# Patient Record
Sex: Female | Born: 1945
Health system: Southern US, Community
[De-identification: ages and names within clinical notes are randomized; demographics above are authoritative.]

## PROBLEM LIST (undated history)

## (undated) DIAGNOSIS — Z86010 Personal history of colonic polyps: Secondary | ICD-10-CM

## (undated) DIAGNOSIS — M797 Fibromyalgia: Secondary | ICD-10-CM

## (undated) DIAGNOSIS — Z923 Personal history of irradiation: Secondary | ICD-10-CM

## (undated) DIAGNOSIS — B0229 Other postherpetic nervous system involvement: Secondary | ICD-10-CM

## (undated) DIAGNOSIS — T4145XA Adverse effect of unspecified anesthetic, initial encounter: Secondary | ICD-10-CM

## (undated) DIAGNOSIS — K219 Gastro-esophageal reflux disease without esophagitis: Secondary | ICD-10-CM

## (undated) DIAGNOSIS — J189 Pneumonia, unspecified organism: Secondary | ICD-10-CM

## (undated) DIAGNOSIS — Z87442 Personal history of urinary calculi: Secondary | ICD-10-CM

## (undated) DIAGNOSIS — J449 Chronic obstructive pulmonary disease, unspecified: Secondary | ICD-10-CM

## (undated) DIAGNOSIS — N200 Calculus of kidney: Secondary | ICD-10-CM

## (undated) DIAGNOSIS — T8859XA Other complications of anesthesia, initial encounter: Secondary | ICD-10-CM

## (undated) DIAGNOSIS — C50919 Malignant neoplasm of unspecified site of unspecified female breast: Secondary | ICD-10-CM

## (undated) HISTORY — DX: Other postherpetic nervous system involvement: B02.29

## (undated) HISTORY — DX: Personal history of colonic polyps: Z86.010

## (undated) HISTORY — DX: Gastro-esophageal reflux disease without esophagitis: K21.9

## (undated) HISTORY — DX: Personal history of irradiation: Z92.3

## (undated) HISTORY — DX: Calculus of kidney: N20.0

## (undated) HISTORY — DX: Malignant neoplasm of unspecified site of unspecified female breast: C50.919

## (undated) HISTORY — DX: Fibromyalgia: M79.7

## (undated) HISTORY — PX: APPENDECTOMY: SHX54

---
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
## 1974-05-30 HISTORY — PX: TUBAL LIGATION: SHX77

## 1991-05-31 HISTORY — PX: GLAUCOMA SURGERY: SHX656

## 1994-05-30 HISTORY — PX: FOOT SURGERY: SHX648

## 1998-05-30 DIAGNOSIS — B0229 Other postherpetic nervous system involvement: Secondary | ICD-10-CM

## 1998-05-30 HISTORY — DX: Other postherpetic nervous system involvement: B02.29

## 2002-12-19 ENCOUNTER — Emergency Department (HOSPITAL_COMMUNITY): Admission: AD | Admit: 2002-12-19 | Discharge: 2002-12-20 | Payer: Self-pay | Admitting: Emergency Medicine

## 2002-12-20 ENCOUNTER — Encounter: Payer: Self-pay | Admitting: Emergency Medicine

## 2004-07-07 ENCOUNTER — Ambulatory Visit: Payer: Self-pay | Admitting: Family Medicine

## 2004-10-05 ENCOUNTER — Ambulatory Visit: Payer: Self-pay | Admitting: Family Medicine

## 2004-10-20 ENCOUNTER — Encounter: Admission: RE | Admit: 2004-10-20 | Discharge: 2004-10-20 | Payer: Self-pay | Admitting: Family Medicine

## 2004-10-26 LAB — HM MAMMOGRAPHY: HM Mammogram: NORMAL

## 2004-11-05 ENCOUNTER — Ambulatory Visit: Payer: Self-pay | Admitting: Family Medicine

## 2004-12-06 ENCOUNTER — Encounter: Admission: RE | Admit: 2004-12-06 | Discharge: 2004-12-06 | Payer: Self-pay | Admitting: Sports Medicine

## 2005-03-18 ENCOUNTER — Ambulatory Visit: Payer: Self-pay | Admitting: Family Medicine

## 2005-03-22 ENCOUNTER — Ambulatory Visit: Payer: Self-pay | Admitting: Family Medicine

## 2005-03-30 LAB — FECAL OCCULT BLOOD, GUAIAC: Fecal Occult Blood: NEGATIVE

## 2005-04-13 ENCOUNTER — Ambulatory Visit: Payer: Self-pay | Admitting: Family Medicine

## 2005-11-09 ENCOUNTER — Ambulatory Visit: Payer: Self-pay | Admitting: Family Medicine

## 2006-01-12 ENCOUNTER — Ambulatory Visit: Payer: Self-pay | Admitting: Family Medicine

## 2006-03-29 ENCOUNTER — Ambulatory Visit: Payer: Self-pay | Admitting: Family Medicine

## 2006-05-17 ENCOUNTER — Ambulatory Visit: Payer: Self-pay | Admitting: Family Medicine

## 2006-08-04 ENCOUNTER — Ambulatory Visit: Payer: Self-pay | Admitting: Family Medicine

## 2006-09-30 ENCOUNTER — Emergency Department (HOSPITAL_COMMUNITY): Admission: EM | Admit: 2006-09-30 | Discharge: 2006-09-30 | Payer: Self-pay | Admitting: Emergency Medicine

## 2007-03-21 ENCOUNTER — Telehealth: Payer: Self-pay | Admitting: Family Medicine

## 2007-03-27 ENCOUNTER — Telehealth: Payer: Self-pay | Admitting: Family Medicine

## 2007-03-29 ENCOUNTER — Encounter (INDEPENDENT_AMBULATORY_CARE_PROVIDER_SITE_OTHER): Payer: Self-pay | Admitting: *Deleted

## 2007-04-09 ENCOUNTER — Telehealth: Payer: Self-pay | Admitting: Family Medicine

## 2009-04-14 ENCOUNTER — Ambulatory Visit: Payer: Self-pay | Admitting: Family Medicine

## 2009-04-14 DIAGNOSIS — B0229 Other postherpetic nervous system involvement: Secondary | ICD-10-CM | POA: Insufficient documentation

## 2009-04-14 HISTORY — DX: Other postherpetic nervous system involvement: B02.29

## 2009-04-17 ENCOUNTER — Telehealth (INDEPENDENT_AMBULATORY_CARE_PROVIDER_SITE_OTHER): Payer: Self-pay | Admitting: Internal Medicine

## 2009-05-04 DIAGNOSIS — N200 Calculus of kidney: Secondary | ICD-10-CM | POA: Insufficient documentation

## 2009-05-04 DIAGNOSIS — J449 Chronic obstructive pulmonary disease, unspecified: Secondary | ICD-10-CM

## 2009-05-04 HISTORY — DX: Calculus of kidney: N20.0

## 2009-05-30 ENCOUNTER — Telehealth: Payer: Self-pay | Admitting: Internal Medicine

## 2009-08-21 ENCOUNTER — Telehealth: Payer: Self-pay | Admitting: Family Medicine

## 2009-09-25 ENCOUNTER — Encounter: Payer: Self-pay | Admitting: Family Medicine

## 2009-09-25 ENCOUNTER — Telehealth: Payer: Self-pay | Admitting: Family Medicine

## 2009-09-28 ENCOUNTER — Telehealth: Payer: Self-pay | Admitting: Family Medicine

## 2009-10-28 ENCOUNTER — Telehealth: Payer: Self-pay | Admitting: Family Medicine

## 2009-11-04 ENCOUNTER — Ambulatory Visit: Payer: Self-pay | Admitting: Family Medicine

## 2009-11-04 DIAGNOSIS — M26629 Arthralgia of temporomandibular joint, unspecified side: Secondary | ICD-10-CM

## 2009-11-04 HISTORY — DX: Arthralgia of temporomandibular joint, unspecified side: M26.629

## 2009-11-13 ENCOUNTER — Telehealth: Payer: Self-pay | Admitting: Family Medicine

## 2009-11-16 ENCOUNTER — Telehealth: Payer: Self-pay | Admitting: Family Medicine

## 2010-02-10 ENCOUNTER — Telehealth: Payer: Self-pay | Admitting: Family Medicine

## 2010-02-20 ENCOUNTER — Emergency Department (HOSPITAL_COMMUNITY): Admission: EM | Admit: 2010-02-20 | Discharge: 2010-02-20 | Payer: Self-pay | Admitting: Emergency Medicine

## 2010-02-22 ENCOUNTER — Telehealth: Payer: Self-pay | Admitting: Family Medicine

## 2010-06-15 ENCOUNTER — Ambulatory Visit
Admission: RE | Admit: 2010-06-15 | Discharge: 2010-06-15 | Payer: Self-pay | Source: Home / Self Care | Attending: Family Medicine | Admitting: Family Medicine

## 2010-06-29 NOTE — Letter (Signed)
Summary: Results Follow up Letter  [place] [facility]  [address] [place], [place] [address]   Phone: [phone]  Fax: [phone]    03/29/2007 MRN: [id number]  Bernadette Eblen [address] [place], [place]  [address]  Dear Ms. Shatz,  The following are the results of your recent test(s):  Test         Result    Pap Smear:        Normal _____  Not Normal _____ Comments: ______________________________________________________ Cholesterol: LDL(Bad cholesterol):         Your goal is less than:         HDL (Good cholesterol):       Your goal is more than: Comments:  ______________________________________________________ Mammogram:        Normal _____  Not Normal _____ Comments:  ___________________________________________________________________ Hemoccult:        Normal _____  Not normal _______ Comments:    _____________________________________________________________________ Other Tests:ENCLOSED IS THE NOTE THAT DR. SCHALLER DICTATED FOR YOU. AND A CY OF THE VISIT.    We routinely do not discuss normal results over the telephone.  If you desire a copy of the results, or you have any questions about this information we can discuss them at your next office visit.   Sincerely,

## 2010-06-29 NOTE — Progress Notes (Signed)
Summary: gabapentin  Phone Note Refill Request   Refills Requested: Medication #1:  NEURONTIN 300 MG CAPS 1 by mouth up to three times a day for shingles pain.   Supply Requested: 1 month [facility] [address] [phone]  Patient has appt with you next week   Method Requested: Electronic Initial call taken by: Benny Lennert CMA Duncan Dull),  October 28, 2009 2:35 PM  Follow-up for Phone Call        sent to pharmacy Follow-up by: Benny Lennert CMA Duncan Dull),  October 30, 2009 7:28 AM    Prescriptions: NEURONTIN 300 MG CAPS (GABAPENTIN) 1 by mouth up to three times a day for shingles pain  #90 x 0   Entered and Authorized by:   [name] MD   Signed by:   [name] MD on 10/29/2009   Method used:   Telephoned to ...         RxID:   [id number]

## 2010-06-29 NOTE — Assessment & Plan Note (Signed)
Summary: ESTABH FROMO BILLIE/DLO   Vital Signs:  Patient profile:   65 year old female Height:      58.75 inches Weight:      162.2 pounds BMI:     33.16 Temp:     97.8 degrees F oral Pulse rate:   80 / minute Pulse rhythm:   regular BP sitting:   140 / 90  (left arm) Cuff size:   large  Vitals Entered By: Benny Lennert CMA (AAMA) (November 04, 2009 11:00 AM)  History of Present Illness: Chief complaint establish from billie   Here to discuss Post Herpatic Neuralgia: Shingles on right face  in 2000.. has been on neurontin and lyrica in the past...neurontin helped in past for her but not helping as much lately. Occ takes extra which helps more.  Lyrica was expensive..75 mg did not help. Pain interferes with eating and brushing teeth. Has been in study in past... has seen neurologist in past.   Has hx of TMJ in that area as well... notes pain now with biting. Likely grinds teeth.  Last saw dentist several years ago.   Sees OB GYN.. has not seen them in several years. Cannot do it at this point because no insurance. Working on getting it.  Problems Prior to Update: 1)  Hx of Renal Calculus  (ICD-592.0) 2)  Hx of Bronchitis, Chronic  (ICD-491.9) 3)  Gerd  (ICD-530.81) 4)  Postherpetic Neuralgia  (ICD-053.19) 5)  Viral Infection  (ICD-079.99)  Current Medications (verified): 1)  Caltrate 600+d 600-400 Mg-Unit Tabs (Calcium Carbonate-Vitamin D) .... Two Tabs Twice A Day 2)  Dayquil Multi-Symptom 30-325-10 Mg/25ml Liqd (Pseudoephedrine-Apap-Dm) .... Otc As Directed. 3)  Nyquil 60-7.10-26-998 Mg/103ml Liqd (Pseudoeph-Doxylamine-Dm-Apap) .... Otc As Directed. 4)  Neurontin 300 Mg Caps (Gabapentin) .Marland Kitchen.. 1 By Mouth Up To Three Times A Day For Shingles Pain  Allergies: 1)  ! Penicillin 2)  ! Steroids 3)  ! Sulfa  Past History:  Past medical, surgical, family and social histories (including risk factors) reviewed, and no changes noted (except as noted below).  Past Medical  History: Reviewed history from 05/04/2009 and no changes required. GERD  Past Surgical History: Reviewed history from 05/04/2009 and no changes required. MRI/ MRA of head- negative (10/1998)  Family History: Reviewed history from 05/04/2009 and no changes required. Father: hypertension, diabetes, CHF Mother: hypertension, liver cancer Siblings: 1 sister, 3 brothers: colitis, ? kidney issues  Social History: Reviewed history from 05/04/2009 and no changes required. Marital Status: Married Children: 2 Occupation: accounting,retired..now sells [employer] Alcohol use-no Drug use-no Regular exercise-yes, intermittantly at [employer] Diet: fruits and veggies  Review of Systems General:  Denies fatigue and fever. CV:  Denies chest pain or discomfort. Resp:  Denies shortness of breath. GI:  Denies abdominal pain. GU:  Denies dysuria.  Physical Exam  General:  Overwieght female inNAD Head:  no macillary sinus ttp Eyes:  No corneal or conjunctival inflammation noted. EOMI. Perrla. Funduscopic exam benign, without hemorrhages, exudates or papilledema. Vision grossly normal. Ears:  External ear exam shows no significant lesions or deformities.  Otoscopic examination reveals clear canals, tympanic membranes are intact bilaterally without bulging, retraction, inflammation or discharge. Hearing is grossly normal bilaterally. Nose:  External nasal examination shows no deformity or inflammation. Nasal mucosa are pink and moist without lesions or exudates. Mouth:  Oral mucosa and oropharynx without lesions or exudates.  Teeth in good repair. PAin over TMJ Neck:  no carotid bruit or thyromegaly no cervical or supraclavicular  lymphadenopathy  Lungs:  Normal respiratory effort, chest expands symmetrically. Lungs are clear to auscultation, no crackles or wheezes. Heart:  Normal rate and regular rhythm. S1 and S2 normal without gallop, murmur, click, rub or other extra sounds.   Impression &  Recommendations:  Problem # 1:  POSTHERPETIC NEURALGIA (ICD-053.19) Increase neurontin to 600 mg three times a day. Call if SE or not improving.   Problem # 2:  TEMPOROMANDIBULAR JOINT PAIN (ICD-524.62) Assessment: Comment Only  See dentist about grinding at night. Consider NSAIDs if not improving.   Problem # 3:  Preventive Health Care (ICD-V70.0) Overdue for CPX and routine labs. Pt cannot scheudle due to not having insurance.  Complete Medication List: 1)  Caltrate 600+d 600-400 Mg-unit Tabs (Calcium carbonate-vitamin d) .... Two tabs twice a day 2)  Dayquil Multi-symptom 30-325-10 Mg/34ml Liqd (Pseudoephedrine-apap-dm) .... Otc as directed. 3)  Nyquil 60-7.10-26-998 Mg/40ml Liqd (Pseudoeph-doxylamine-dm-apap) .... Otc as directed. 4)  Neurontin 600 Mg Tabs (Gabapentin) .Marland Kitchen.. 1 tab by mouth three times a day  Patient Instructions: 1)  Schedule complete physical when able. ...make sure to come in for fasting labs prior to appt.  2)  Increase neurontin to 2 tab by mouth three times a day.  Current Allergies (reviewed today): ! PENICILLIN ! STEROIDS ! SULFA      Family History:    Reviewed history from 05/04/2009 and no changes required:       Father: hypertension, diabetes, CHF       Mother: hypertension, liver cancer       Siblings: 1 sister, 3 brothers: colitis, ? kidney issues         Social History:    Reviewed history from 05/04/2009 and no changes required:       Marital Status: Married       Children: 2       Occupation: accounting,retired..now sells [employer]       Alcohol use-no       Drug use-no       Regular exercise-yes, intermittantly at [employer]       Diet: fruits and veggies

## 2010-06-29 NOTE — Progress Notes (Signed)
Summary: Pt does not feel better.  Phone Note Call from Patient Call back at [phone]   Caller: Patient Call For: Everrett Coombe, FNP Summary of Call: Pt is not feeling any better. Still productive cough with yellow green mucus and chest congestion. Pt feels nauseated this AM. Pt wonders if anything else can do. Supposed to go to beach today but will wait to hear from you. Pt can be reached at [phone]. Pt uses [facility] [address] pharmacy. Please advise.  Initial call taken by: Lewanda Rife LPN,  April 17, 2009 12:42 PM  Follow-up for Phone Call        will add ABT--attatched  Billie-Lynn Tyler Deis FNP  April 17, 2009 1:51 PM   Patient notified as instructed by telephone. Medication phoned to [facility] [address] as instructed. Follow-up by: Lewanda Rife LPN,  April 17, 2009 5:47 PM    New/Updated Medications: MINOCIN 100 MG CAPS (MINOCYCLINE HCL) take 1 two times a day Prescriptions: MINOCIN 100 MG CAPS (MINOCYCLINE HCL) take 1 two times a day  #14 x 0   Entered and Authorized by:   Gildardo Griffes FNP   Signed by:   Lewanda Rife LPN on 03/26/2535   Method used:   Telephoned to ...         RxID:   [id number]

## 2010-06-29 NOTE — Progress Notes (Signed)
Summary: Neurontin  Phone Note Refill Request Message from:  Scriptline on Sep 28, 2009 11:12 AM  Refills Requested: Medication #1:  NEURONTIN 300 MG CAPS 1 by mouth up to three times a day for shingles pain. [facility]   Last Fill Date:  08/21/2009   Pharmacy Phone:  [phone]   Method Requested: Electronic Initial call taken by: Delilah Shan CMA Duncan Dull),  Sep 28, 2009 11:12 AM  Follow-up for Phone Call        Called pharmacy to okay refill and was informed that this was already refilled on 09/25/09 by Rena. Patient notified as instructed by telephone.  Patient stated that she will check her schedule and call back to schedule an appt. Follow-up by: Sydell Axon LPN,  Sep 29, 5782 2:09 PM    Prescriptions: NEURONTIN 300 MG CAPS (GABAPENTIN) 1 by mouth up to three times a day for shingles pain  #90 x 0   Entered and Authorized by:   Shaune Leeks MD   Signed by:   Shaune Leeks MD on 09/28/2009   Method used:   Print then Give to Patient   RxID:   [id number]  Please have the pt see me within the month. Needs to be seen before another prescription after this one.

## 2010-06-29 NOTE — Progress Notes (Signed)
  Phone Note Call from Patient   Caller: Patient Call For: DR. Stacie Templin Summary of Call: NOW SHE WANTS A NOTE DICTATED SAYING THAT SHE JUST HAD A RASH CALL WHEN READY ON CELL [phone] Initial call taken by: Providence Crosby,  April 09, 2007 5:06 PM  Follow-up for Phone Call        no ans 515 11/10.  Additional Follow-up for Phone Call Additional follow up Details #1::        Spoke with pt...did not see rash myself but pt says she saw classic rash, I saw only redness when pt returned for f/u. Pt will see if they want a statement to that effect. Additional Follow-up by: Shaune Leeks MD,  April 10, 2007 7:54 AM         Appended Document:  patient called back and said that it would be okay to dictate letter like you said  Appended Document:  letter dictated.

## 2010-06-29 NOTE — Assessment & Plan Note (Signed)
Summary: Upper Respiratory   Vital Signs:  Patient profile:   65 year old female Height:      58.75 inches Weight:      155.50 pounds BMI:     31.79 Temp:     98.6 degrees F oral Pulse rate:   80 / minute Pulse rhythm:   regular Resp:     20 per minute BP sitting:   128 / 80  (left arm) Cuff size:   large  Vitals Entered By: Lewanda Rife LPN (April 14, 2009 3:13 PM)  CC:  Upper respiratory with chest congestion and productive cough with yellow green mucus.  History of Present Illness: Herr for URI signs and cough==productive of yellow mucus --lost voice Sunday--back some now --no fever or chills --taking Dayquil and Nyquil--did not work last tnight--coughed most of the night, husband has vicodin at home for pain--she can take for cough  Has not been seen since 08/04/06--has been well, no preventive care--discussed the many items that she is benind on, as mammo and pap--no insurance at this time, working on getting some, will catch up when in place.  Allergies (verified): 1)  ! Penicillin 2)  ! Steroids 3)  ! Sulfa  Review of Systems      See HPI  Physical Exam  General:  alert, well-developed, well-nourished, well-hydrated, and overweight-appearing.  NAD Ears:  TMs retracted with increased fluid Nose:  no airflow obstruction, mucosal erythema, and mucosal edema.  sinuses neg Mouth:  no exudates and pharyngeal erythema.  voice in a whisper at times, cracks at times, nl at times Neck:  no JVD and no carotid bruits.   Lungs:  moist cough, no crackles and no wheezes.   Heart:  normal rate, regular rhythm, and no murmur.   Neurologic:  alert & oriented X3 and gait normal.   Cervical Nodes:  no anterior cervical adenopathy and no posterior cervical adenopathy.   Psych:  normally interactive, not anxious appearing, and not depressed appearing.     Impression & Recommendations:  Problem # 1:  VIRAL INFECTION (ICD-079.99) Assessment New suspect viral continue comfort care  measures: increase po fluids, rest, tylenol or IBP as needed gargle frequently see back if worsens will use vicodin at hs for cough Her updated medication list for this problem includes:    Dayquil Multi-symptom 30-325-10 Mg/74ml Liqd (Pseudoephedrine-apap-dm) ..... Otc as directed.    Nyquil 60-7.10-26-998 Mg/26ml Liqd (Pseudoeph-doxylamine-dm-apap) ..... Otc as directed.  Problem # 2:  POSTHERPETIC NEURALGIA (ICD-053.19) Assessment: Deteriorated has hx of ---has seen neurologist in 2000--continues to have flair at times does not have Lyrica and would like some, usually took 150mg , however will start on 75 at hs and increase to 1 two times a day as needed --will try to get from pharmaceutical co  Complete Medication List: 1)  Caltrate 600+d 600-400 Mg-unit Tabs (Calcium carbonate-vitamin d) .... Two tabs twice a day 2)  Dayquil Multi-symptom 30-325-10 Mg/31ml Liqd (Pseudoephedrine-apap-dm) .... Otc as directed. 3)  Nyquil 60-7.10-26-998 Mg/8ml Liqd (Pseudoeph-doxylamine-dm-apap) .... Otc as directed. 4)  Vicodin 5-500 Mg Tabs (Hydrocodone-acetaminophen) .... One tab as needed for pain 5)  Lyrica 75 Mg Caps (Pregabalin) .... Take 1-2 daily as needed facial pain Prescriptions: LYRICA 75 MG CAPS (PREGABALIN) take 1-2 daily as needed facial pain  #60 x 6   Entered and Authorized by:   Gildardo Griffes FNP   Signed by:   Gildardo Griffes FNP on 04/14/2009   Method used:   Print then Give  to Patient   RxID:   [id number] VICODIN 5-500 MG TABS (HYDROCODONE-ACETAMINOPHEN) one tab as needed for pain  #20 x 0   Entered and Authorized by:   Gildardo Griffes FNP   Signed by:   Gildardo Griffes FNP on 04/14/2009   Method used:   Print then Give to Patient   RxID:   [id number]   Current Allergies (reviewed today): ! PENICILLIN ! STEROIDS ! SULFA

## 2010-06-29 NOTE — Progress Notes (Signed)
Summary: On call- cough/ fever  Phone Note Call from Patient   Summary of Call: On call- Husband reports patient with barking cough, fever 100 deg, chest rattle. She did have flu vax.  Plan: Z pak, phen/codeine cough syrup, fluids.    New/Updated Medications: ZITHROMAX Z-PAK 250 MG TABS (AZITHROMYCIN) 2 today then one daily PROMETHAZINE-CODEINE 6.25-10 MG/5ML SYRP (PROMETHAZINE-CODEINE) 1 teaspoon four times a day as needed cough Prescriptions: PROMETHAZINE-CODEINE 6.25-10 MG/5ML SYRP (PROMETHAZINE-CODEINE) 1 teaspoon four times a day as needed cough  #200 ml x 0   Entered and Authorized by:   Waymon Budge MD   Signed by:   Waymon Budge MD on 05/30/2009   Method used:   Telephoned to ...         RxID:   [id number] ZITHROMAX Z-PAK 250 MG TABS (AZITHROMYCIN) 2 today then one daily  #1 pak x 0   Entered and Authorized by:   Waymon Budge MD   Signed by:   Waymon Budge MD on 05/30/2009   Method used:   Telephoned to ...         RxID:   [id number]

## 2010-06-29 NOTE — Progress Notes (Signed)
Summary: gabapentin  Phone Note Refill Request Message from:  Scriptline on November 13, 2009 7:54 AM  Refills Requested: Medication #1:  NEURONTIN 600 MG TABS 1 tab by mouth three times a day.   Supply Requested: 1 month   Last Refilled: 10/28/[facility] [address]     Prescriptions: NEURONTIN 600 MG TABS (GABAPENTIN) 1 tab by mouth three times a day  #90 x 5   Entered and Authorized by:   [name] MD   Signed by:   [name] MD on 11/13/2009   Method used:   Electronically to        [facility] [address] ([facility]ail)       [address], [address]       [place], [place]  [address]       Ph: [phone]       Fax: [phone]   RxID:   [id number]

## 2010-06-29 NOTE — Miscellaneous (Signed)
  Clinical Lists Changes      Prior Medications: CALTRATE 600+D 600-400 MG-UNIT TABS (CALCIUM CARBONATE-VITAMIN D) two tabs twice a day DAYQUIL MULTI-SYMPTOM 30-325-10 MG/15ML LIQD (PSEUDOEPHEDRINE-APAP-DM) OTC As directed. NYQUIL 60-7.10-26-998 MG/30ML LIQD (PSEUDOEPH-DOXYLAMINE-DM-APAP) OTC As directed. VICODIN 5-500 MG TABS (HYDROCODONE-ACETAMINOPHEN) one tab as needed for pain ZITHROMAX Z-PAK 250 MG TABS (AZITHROMYCIN) 2 today then one daily PROMETHAZINE-CODEINE 6.25-10 MG/5ML SYRP (PROMETHAZINE-CODEINE) 1 teaspoon four times a day as needed cough NEURONTIN 300 MG CAPS (GABAPENTIN) 1 by mouth up to three times a day for shingles pain Current Allergies: ! PENICILLIN ! STEROIDS ! SULFA

## 2010-06-29 NOTE — Progress Notes (Signed)
Summary: call a nurse  Phone Note Call from Patient   Caller: Patient Call For: [name] MD Summary of Call: Triage Record Num: [id number] Operator: Edgar Frisk Patient Name: Katie Zimmerman Call Date & Time: 02/20/2010 3:26:54AM Patient Phone: [phone] PCP: [name] Patient Gender: Female PCP Fax : [phone] Patient DOB: 07-01-1945 Practice Name: [facility] Reason for Call: Pt calling ,reports has severe LLQ abd pain tonight ,rates 10 on 1-10 pain scale. No relief with rest or Vicodin x2 taken of her husbands meds. Reports has hx kidney stones and feels like same pain. Advised to go to ED now for evaluation and treatment Protocol(s) Used: Abdominal Pain Recommended Outcome per Protocol: See ED Immediately Reason for Outcome: Unbearable abdominal/pelvic pain Care Advice:  ~ Another adult should drive.  ~ Do not eat or drink anything until evaluated by provider. Write down provider's name. List or place the following in a bag for transport with the patient: current prescription and/or nonprescription medications; alternative treatments, therapies and medications; and street drugs.  ~ Initial call taken by: Melody Comas,  February 22, 2010 9:18 AM

## 2010-06-29 NOTE — Progress Notes (Signed)
Summary: wants to change gabapentin script  Phone Note Call from Patient Call back at Home Phone [phone] Call back at [phone]   Caller: Patient Summary of Call: Pt is asking if she can get a new script for gabapentin.  She wants 300 mg's, to take 2 three times a day.  This will be less expensive for her then taking one 600 mg 3 times a day.  Uses [facility] [address]. Initial call taken by: Lowella Petties CMA,  November 16, 2009 12:41 PM  Follow-up for Phone Call        ok Follow-up by: Hannah Beat MD,  November 16, 2009 12:49 PM    New/Updated Medications: NEURONTIN 300 MG CAPS (GABAPENTIN) Take 2 capsules by mouth three times a day Prescriptions: NEURONTIN 300 MG CAPS (GABAPENTIN) Take 2 capsules by mouth three times a day  #180 x 5   Entered by:   Delilah Shan CMA (AAMA)   Authorized by:   Hannah Beat MD   Signed by:   Delilah Shan CMA (AAMA) on 11/16/2009   Method used:   Electronically to        [facility] [address] ([facility]ail)       [address], [address]       [place], [place]  [address]       Ph: [phone]       Fax: [phone]   RxID:   [id number]

## 2010-06-29 NOTE — Progress Notes (Signed)
Summary: Lyrica  Phone Note Call from Patient Call back at [phone]   Summary of Call: Patient was seen by Billie Bean facial pain from nerve damage after having shingles.  She was given Lyrica and patient is wanting to know if she can swtich to something different because the Lyrica is so expensive. She mentione Neproxin, she says that a friend of hers takes the Neproxin and it works well for her. Please advise. Uses [facility] [address]  Initial call taken by: Melody Comas,  August 21, 2009 9:23 AM  Follow-up for Phone Call        I do not know what that is -- does she mean naproxen (is anti inflammatory-- high dose aleve) or something else?  Follow-up by: Judith Part MD,  August 21, 2009 9:53 AM  Additional Follow-up for Phone Call Additional follow up Details #1::        Called Patient back and she actually meant Neurontin. She says that she has taken this in the past and it did not help alot, but was on a really low dose. She thinks maybe if she is on a higher strength it would work because that is what her friend does. She said that if you think of different that would work and is more affordable than Lyrica that is fine too.  Additional Follow-up by: Melody Comas,  August 21, 2009 10:14 AM    Additional Follow-up for Phone Call Additional follow up Details #2::    can switch to neurontin- need to watch out for sedation will start with low dose to see how she tolerates it and then gradually increase px written on EMR for call in  update if side eff plan f/u with whoever she is establishing with (? [facility] or [facility])  in about 3-4 weeks please  Follow-up by: Judith Part MD,  August 21, 2009 11:31 AM  Additional Follow-up for Phone Call Additional follow up Details #3:: Details for Additional Follow-up Action Taken: Patient notified as instructed.  Rx sent to [facility] electronically. Additional Follow-up by: Linde Gillis CMA Duncan Dull),  August 21, 2009 11:41  AM  New/Updated Medications: NEURONTIN 300 MG CAPS (GABAPENTIN) 1 by mouth up to three times a day for shingles pain Prescriptions: NEURONTIN 300 MG CAPS (GABAPENTIN) 1 by mouth up to three times a day for shingles pain  #90 x 0   Entered by:   Linde Gillis CMA (AAMA)   Authorized by:   Judith Part MD   Signed by:   Linde Gillis CMA (AAMA) on 08/21/2009   Method used:   Electronically to        [facility] [address]ail)       [address], [address]       [place], [place]  [address]       Ph: [phone]       Fax: [phone]   RxID:   [id number]

## 2010-06-29 NOTE — Progress Notes (Signed)
Summary: pt has vomiting, diarrhea  Phone Note Call from Patient Call back at [phone]   Caller: Patient Call For: Katie Zimmerman Summary of Call: Pt states she has had vomiting and diarrhea since last night.  No abd pain and no known fever.  She says she is having frequent vomiting and asks if she can have something for that called to [facility] [address].  Otherwise advised pt to have frequent small sips of clear fluids, no solid foods and no dairy products. Initial call taken by: Lowella Petties CMA,  February 10, 2010 10:00 AM  Follow-up for Phone Call        Will call in small amount of pohenergan, but if not keeping down fluids in next 24 hours ...make appt to be seen.  Follow-up by: Katie Zimmerman,  February 10, 2010 1:12 PM  Additional Follow-up for Phone Call Additional follow up Details #1::        Patient advised and rx faxed to pharmacy  Additional Follow-up by: Benny Lennert CMA Duncan Dull),  February 10, 2010 2:04 PM    New/Updated Medications: PROMETHEGAN 12.5 MG SUPP (PROMETHAZINE HCL) 1 pr q 6 hours as needed nausea Prescriptions: PROMETHEGAN 12.5 MG SUPP (PROMETHAZINE HCL) 1 pr q 6 hours as needed nausea  #15 x 0   Entered by:   Benny Lennert CMA (AAMA)   Authorized by:   Katie Zimmerman   Signed by:   Benny Lennert CMA (AAMA) on 02/10/2010   Method used:   Electronically to        [facility] [address]       [address], [address]       [place], [place]  [address]       Ph: [phone]       Fax: [phone]   RxID:   [id number] PROMETHEGAN 12.5 MG SUPP (PROMETHAZINE HCL) 1 pr q 6 hours as needed nausea  #15 x 0   Entered and Authorized by:   Katie Zimmerman   Signed by:   Katie Zimmerman on 02/10/2010   Method used:   Telephoned to ...         RxID:   [id number]

## 2010-06-29 NOTE — Progress Notes (Signed)
Summary: Neurontin refill ??  Phone Note Refill Request Call back at [phone] Message from:  [facility] on September 25, 2009 4:34 PM  Refills Requested: Medication #1:  NEURONTIN 300 MG CAPS 1 by mouth up to three times a day for shingles pain.   Last Refilled: 08/21/2009 [facility] request refill for Neurontin. This is Katie Zimmerman's pt. Dr Milinda Antis did rx for Neurontin on 08/21/09 phone note but in the note  Dr Milinda Antis said will start with low dose to see how she tolerates med and then gradulally increase. Suggested pt make f/u appt with whoever she was going to establish with(? Dr Dayton Martes) in 3-4 weeks. I checked appts and pt has not made appt yet. Dr Dayton Martes has gone for the day and Dr Milinda Antis has also gone for the day. Please advise.   Initial call taken by: Lewanda Rife LPN,  September 25, 2009 4:38 PM  Follow-up for Phone Call        Make her follow up apt with appropriate MD, but refill neuronitin at some dose untilthen.  Follow-up by: [name] MD,  September 25, 2009 4:48 PM  Additional Follow-up for Phone Call Additional follow up Details #1::        Pt's work # has been disconnected. Unable to reach pt on cell # [phone]. I called in rx to [facility] and asked them to give message to pt to call for appt.Lewanda Rife LPN  September 25, 2009 4:55 PM

## 2010-06-29 NOTE — Progress Notes (Signed)
Summary: fyi  Phone Note From Other Clinic Call back at [phone], fax [phone]   Caller: Katie Zimmerman- PA @ [facility] Call For: Katie Zimmerman Summary of Call: pt called to have notes faxed to them from when she had shingles in 1999, Per [facility] [employer] faxed note to them this am, but PA says they needs notes that specifically discuss the shingles rash and  post term headache neuralgia. Initial call taken by: Liane Comber,  March 21, 2007 1:54 PM  Follow-up for Phone Call        They called again carrie advised them we faxed all the  info we had re: that visit ..................................................................Marland KitchenLiane Comber  March 21, 2007 2:04 PM Noted. Follow-up by: Shaune Leeks MD,  March 21, 2007 8:03 PM

## 2010-07-01 NOTE — Assessment & Plan Note (Signed)
Summary: PRODUCTIVE COUGH/ lb   Vital Signs:  Patient profile:   65 year old female Weight:      159 pounds BMI:     32.51 Temp:     98.6 degrees F oral Pulse rate:   88 / minute Pulse rhythm:   regular BP sitting:   122 / 86  (left arm) Cuff size:   large  Vitals Entered By: Selena Batten Dance CMA Duncan Dull) (June 15, 2010 12:39 PM) CC: Cough x5 days   History of Present Illness: started getting sick about 5 days ago  started with cough - productive immediately nose runs a bit after she coughs / little stuffy   throat is raw when coughing  is wheezing a bit - --non smoker (2nd hand exp in the past)  had bad pneumonia in past -- and was dx with chronic asthmatic bronchitis by Dr Maple Hudson  has not had a pneumovax in over 7 years  also has not had flu shot this   she borrowed her husband's inhaler -- albuterol helps a bit   phlegm is yellow   husband had this illness first   can take prednisone - just not other steroids     Allergies: 1)  ! Penicillin 2)  ! Steroids 3)  ! Sulfa  Past History:  Past Medical History: Last updated: 05/04/2009 GERD  Past Surgical History: Last updated: 05/04/2009 MRI/ MRA of head- negative (10/1998)  Family History: Last updated: 11/04/2009 Father: hypertension, diabetes, CHF Mother: hypertension, liver cancer Siblings: 1 sister, 3 brothers: colitis, ? kidney issues  Social History: Last updated: 11/04/2009 Marital Status: Married Children: 2 Occupation: accounting,retired..now sells [employer] Alcohol use-no Drug use-no Regular exercise-yes, intermittantly at [employer] Diet: fruits and veggies  Risk Factors: Smoking Status: never (05/04/2009)  Review of Systems General:  Complains of fatigue; denies chills and fever. Eyes:  Denies blurring, discharge, and eye irritation. ENT:  Complains of nasal congestion, postnasal drainage, and sore throat; denies sinus pressure. CV:  Denies chest pain or discomfort and  palpitations. Resp:  Complains of cough, sputum productive, and wheezing; denies pleuritic and shortness of breath. GI:  Denies indigestion, nausea, and vomiting.  Physical Exam  General:  overweight but generally well appearing  Head:  normocephalic, atraumatic, and no abnormalities observed.  no sinus tenderness  Eyes:  vision grossly intact, pupils equal, pupils round, pupils reactive to light, and no injection.   Ears:  R ear normal and L ear normal.   Nose:  nares are injected and congested bilaterally  Mouth:  pharynx pink and moist, no erythema, and no exudates.   Neck:  no carotid bruit or thyromegaly no cervical or supraclavicular lymphadenopathy  Lungs:  harsh bs with exp wheeze worse in lower fields occ scant rhonchi  no rales  Heart:  Normal rate and regular rhythm. S1 and S2 normal without gallop, murmur, click, rub or other extra sounds. Skin:  Intact without suspicious lesions or rashes Cervical Nodes:  No lymphadenopathy noted Psych:  normal affect, talkative and pleasant    Impression & Recommendations:  Problem # 1:  ACUTE BRONCHITIS (ICD-466.0) Assessment New with reactive airways in pt with prior dx of chronic bronchitis  pred taper and zpak px also guifen ac as needed at bedtime  proair mdi if needed - she knows technique pt advised to update me if symptoms worsen or do not improve - esp if wheeze or sob  recommend sympt care- see pt instructions   Her updated medication list  for this problem includes:    Dayquil Multi-symptom 30-325-10 Mg/67ml Liqd (Pseudoephedrine-apap-dm) ..... Otc as directed.    Nyquil 60-7.10-26-998 Mg/58ml Liqd (Pseudoeph-doxylamine-dm-apap) ..... Otc as directed.    Zithromax Z-pak 250 Mg Tabs (Azithromycin) .Marland Kitchen... Take by mouth as directed    Guaifenesin Ac 100-10 Mg/89ml Syrp (Guaifenesin-codeine) .Marland Kitchen... 1-2 teaspoons by mouth at bedtime as needed cough    Proair Hfa 108 (90 Base) Mcg/act Aers (Albuterol sulfate) .Marland Kitchen... 2 puffs up to  every 4 hours as needed wheezing  Complete Medication List: 1)  Caltrate 600+d 600-400 Mg-unit Tabs (Calcium carbonate-vitamin d) .... Two tabs twice a day 2)  Dayquil Multi-symptom 30-325-10 Mg/66ml Liqd (Pseudoephedrine-apap-dm) .... Otc as directed. 3)  Nyquil 60-7.10-26-998 Mg/98ml Liqd (Pseudoeph-doxylamine-dm-apap) .... Otc as directed. 4)  Neurontin 300 Mg Caps (Gabapentin) .... Take 2 capsules by mouth three times a day 5)  Promethegan 12.5 Mg Supp (Promethazine hcl) .Marland Kitchen.. 1 pr q 6 hours as needed nausea 6)  K-tabs 10 Meq Cr-tabs (Potassium chloride) .... 2 by mouth once daily 7)  Prednisone 10 Mg Tabs (Prednisone) .... Take by mouth as directed 8)  Zithromax Z-pak 250 Mg Tabs (Azithromycin) .... Take by mouth as directed 9)  Guaifenesin Ac 100-10 Mg/67ml Syrp (Guaifenesin-codeine) .Marland Kitchen.. 1-2 teaspoons by mouth at bedtime as needed cough 10)  Proair Hfa 108 (90 Base) Mcg/act Aers (Albuterol sulfate) .... 2 puffs up to every 4 hours as needed wheezing  Patient Instructions: 1)  take prednisone as directed 10mg  pills 2)  3 by mouth once daily for 3 days then 3)  2 by mouth once daily for 3 days then 4)  1 by mouth once daily for 3 days then stop  5)  take zithromax as directed 6)  cough med and inhaler if needed 7)  update [place] if worse or more wheezing or short of breath (go to ER if acutely worse)  Prescriptions: PROAIR HFA 108 (90 BASE) MCG/ACT AERS (ALBUTEROL SULFATE) 2 puffs up to every 4 hours as needed wheezing  #1 mdi x 1   Entered and Authorized by:   Judith Part MD   Signed by:   Judith Part MD on 06/15/2010   Method used:   Print then Give to Patient   RxID:   [id number] GUAIFENESIN AC 100-10 MG/5ML SYRP (GUAIFENESIN-CODEINE) 1-2 teaspoons by mouth at bedtime as needed cough  #120cc x 0   Entered and Authorized by:   Judith Part MD   Signed by:   Judith Part MD on 06/15/2010   Method used:   Print then Give to Patient   RxID:    [id number] ZITHROMAX Z-PAK 250 MG TABS (AZITHROMYCIN) take by mouth as directed  #1 pack x 0   Entered and Authorized by:   Judith Part MD   Signed by:   Judith Part MD on 06/15/2010   Method used:   Print then Give to Patient   RxID:   [id number] PREDNISONE 10 MG TABS (PREDNISONE) take by mouth as directed  #18 x 0   Entered and Authorized by:   Judith Part MD   Signed by:   Judith Part MD on 06/15/2010   Method used:   Print then Give to Patient   RxID:   [id number]    Orders Added: 1)  Est. Patient Level III [42595]    Current Allergies (reviewed today): ! PENICILLIN ! STEROIDS ! SULFA

## 2010-08-12 LAB — COMPREHENSIVE METABOLIC PANEL
ALT: 18 U/L (ref 0–35)
AST: 20 U/L (ref 0–37)
Albumin: 3.8 g/dL (ref 3.5–5.2)
Alkaline Phosphatase: 81 U/L (ref 39–117)
BUN: 21 mg/dL (ref 6–23)
CO2: 24 mEq/L (ref 19–32)
Calcium: 9.3 mg/dL (ref 8.4–10.5)
Chloride: 106 mEq/L (ref 96–112)
Creatinine, Ser: 0.99 mg/dL (ref 0.4–1.2)
GFR calc Af Amer: >60 mL/min (ref >60)
GFR calc non Af Amer: 56 mL/min — ABNORMAL LOW (ref >60)
Glucose, Bld: 159 mg/dL — ABNORMAL HIGH (ref 70–99)
Potassium: 4.3 mEq/L (ref 3.5–5.1)
Sodium: 140 mEq/L (ref 135–145)
Total Bilirubin: 0.4 mg/dL (ref 0.3–1.2)
Total Protein: 7.2 g/dL (ref 6.0–8.3)

## 2010-08-12 LAB — CBC
HCT: 40.4 % (ref 36.0–46.0)
Hemoglobin: 13.5 g/dL (ref 12.0–15.0)
MCH: 30.8 pg (ref 26.0–34.0)
MCHC: 33.4 g/dL (ref 30.0–36.0)
MCV: 92.2 fL (ref 78.0–100.0)
Platelets: 313 10*3/uL (ref 150–400)
RBC: 4.38 MIL/uL (ref 3.87–5.11)
RDW: 12.6 % (ref 11.5–15.5)
WBC: 19.5 10*3/uL — ABNORMAL HIGH (ref 4.0–10.5)

## 2010-08-12 LAB — DIFFERENTIAL
Basophils Absolute: 0 10*3/uL (ref 0.0–0.1)
Basophils Relative: 0 % (ref 0–1)
Eosinophils Absolute: 0.1 10*3/uL (ref 0.0–0.7)
Eosinophils Relative: 0 % (ref 0–5)
Lymphocytes Relative: 8 % — ABNORMAL LOW (ref 12–46)
Lymphs Abs: 1.5 10*3/uL (ref 0.7–4.0)
Monocytes Absolute: 1.2 10*3/uL — ABNORMAL HIGH (ref 0.1–1.0)
Monocytes Relative: 6 % (ref 3–12)
Neutro Abs: 16.7 10*3/uL — ABNORMAL HIGH (ref 1.7–7.7)
Neutrophils Relative %: 86 % — ABNORMAL HIGH (ref 43–77)

## 2010-08-12 LAB — URINALYSIS, ROUTINE W REFLEX MICROSCOPIC
Bilirubin Urine: NEGATIVE
Glucose, UA: 100 mg/dL — AB
Ketones, ur: 15 mg/dL — AB
Nitrite: NEGATIVE
Protein, ur: 30 mg/dL — AB
Specific Gravity, Urine: 1.034 — ABNORMAL HIGH (ref 1.005–1.030)
Urobilinogen, UA: 0.2 mg/dL (ref 0.0–1.0)
pH: 6 (ref 5.0–8.0)

## 2010-08-12 LAB — URINE MICROSCOPIC-ADD ON

## 2010-08-12 LAB — LIPASE, BLOOD: Lipase: 44 U/L (ref 11–59)

## 2010-12-07 ENCOUNTER — Other Ambulatory Visit: Payer: Self-pay | Admitting: Family Medicine

## 2010-12-07 NOTE — Telephone Encounter (Signed)
Okayto refill once for 3 months but no further refills until CPX scheuled... Before refill though check dose...Marland Kitchenat last physical she was on 1 tab TID not 2 tabs TID.

## 2010-12-08 ENCOUNTER — Other Ambulatory Visit: Payer: Self-pay | Admitting: *Deleted

## 2010-12-08 MED ORDER — GABAPENTIN 300 MG PO CAPS: ORAL_CAPSULE | ORAL | Status: DC

## 2010-12-08 NOTE — Telephone Encounter (Signed)
Opened in error

## 2010-12-10 NOTE — Telephone Encounter (Signed)
Dose is correct and this was refill  More times by another person in office

## 2010-12-10 NOTE — Telephone Encounter (Signed)
Spoke with patient about  Refills and she said that this has already been refilled by our office it was approved by nurse but, patient cant come into the office for physical until either august or sept b/c shes waiting for medicare and medicare supplement to start

## 2010-12-10 NOTE — Telephone Encounter (Signed)
Okay to make appt in Sept or August. Refill till then if not already done, but make sure correct dose as I started already.

## 2011-06-16 ENCOUNTER — Encounter: Payer: Self-pay | Admitting: Family Medicine

## 2011-06-17 ENCOUNTER — Encounter: Payer: Self-pay | Admitting: Family Medicine

## 2011-06-17 ENCOUNTER — Ambulatory Visit (INDEPENDENT_AMBULATORY_CARE_PROVIDER_SITE_OTHER): Payer: MEDICARE | Admitting: Family Medicine

## 2011-06-17 VITALS — BP 120/78 | HR 75 | Temp 98.2°F | Ht 59.0 in | Wt 158.4 lb

## 2011-06-17 DIAGNOSIS — B0229 Other postherpetic nervous system involvement: Secondary | ICD-10-CM

## 2011-06-17 MED ORDER — GABAPENTIN 300 MG PO CAPS: ORAL_CAPSULE | ORAL | Status: DC

## 2011-06-22 NOTE — Progress Notes (Signed)
  Subjective:    Patient ID: Katie Zimmerman, female    DOB: February 10, 1946, 66 y.o.   MRN: [id number]  HPI MD running late, pt had to leave after 30 min wait. Gabapentin refilled.   Review of Systems     Objective:   Physical Exam        Assessment & Plan:

## 2011-06-27 ENCOUNTER — Telehealth: Payer: Self-pay | Admitting: Family Medicine

## 2011-06-27 NOTE — Telephone Encounter (Signed)
Triage Record Num: [id number] Operator: Valene Bors Patient Name: Katie Zimmerman Call Date & Time: 06/27/2011 2:37:47PM Patient Phone: [phone] PCP: [name] Patient Gender: Female PCP Fax : [phone] Patient DOB: 1946/05/29 Practice Name: [facility] Reason for Call: Caller: [name]/Patient; PCP: Excell Seltzer.; CB#: [phone]; Call regarding Cough/Congestion onset 06/21/11; She had sore throat in the beginning. Afebrile. She has been taking Nyquill Cold and Flu daytime and nighttime. Coughing up mostly clear, some yellow mucos, chest has some tightness. She has had to use inhalers in the past for URi. Care advice per Cough Protocol and appnt scheduled with Dr.Tower @ 1215-06/28/11. Protocol(s) Used: Cough - Adult Recommended Outcome per Protocol: See Provider within 24 hours Reason for Outcome: Productive cough with colored sputum (other than clear or white sputum) Care Advice: ~ Use a cool mist humidifier to moisten air. Be sure to clean according to manufacturer's instructions. Limit or avoid exposure to irritants and allergens (e.g. air pollution, smoke/smoking, chemicals, dust, pollen, pet dander, etc.) ~ Call provider if fever greater than 101.5 F (38.6 C) or 100.5 F (38.1C) in an immunocompromised patient (such as diabetes, HIV/AIDS, renal disease, chemotherapy, organ transplant, or chronic steroid use) has not improved in 24 hours. ~ Increase fluids to 8-12 eight oz (1.6 to 2.4 liters) glasses per day, half of them to be water. Soups, popsicles, fruit juices, non-caffeinated sodas (unless restricting sodium intake), jello, broths, decaf teas, etc. are all okay. Warm fluids can be soothing. ~ ~ HEALTH PROMOTION / MAINTENANCE ~ SYMPTOM / CONDITION MANAGEMENT ~ CAUTIONS Coughing up mucus or phlegm helps to get rid of an infection. A productive cough should not be stopped. A cough medicine with guaifenesin (Robitussin, Mucinex) can help loosen the mucus. Cough  medicine with dextromethorphan (DM) should be avoided. Drinking lots of fluids can help loosen the mucus too, especially warm fluids. ~ 06/27/2011 2:56:17PM Page 1 of 1 [email or web address]

## 2011-06-28 ENCOUNTER — Other Ambulatory Visit: Payer: Self-pay | Admitting: *Deleted

## 2011-06-28 ENCOUNTER — Ambulatory Visit (INDEPENDENT_AMBULATORY_CARE_PROVIDER_SITE_OTHER): Payer: Medicare Other | Admitting: Family Medicine

## 2011-06-28 ENCOUNTER — Encounter: Payer: Self-pay | Admitting: Family Medicine

## 2011-06-28 VITALS — BP 118/80 | HR 100 | Temp 98.4°F | Wt 155.8 lb

## 2011-06-28 DIAGNOSIS — J069 Acute upper respiratory infection, unspecified: Secondary | ICD-10-CM

## 2011-06-28 MED ORDER — GABAPENTIN 300 MG PO CAPS: ORAL_CAPSULE | ORAL | Status: DC

## 2011-06-28 NOTE — Progress Notes (Signed)
  Subjective:    Patient ID: Katie Zimmerman, female    DOB: 1946-02-21, 66 y.o.   MRN: [id number]  HPI Here for uri symptoms  Started 1 week ago - with cough and malaise  Low grade fever (got a flu shot)  After a week - still coughing -- and some prod - phlegm - yellow  Nose is very congested- but clear d/c Makes her L ear whistle -- no pain/ but feels full  Rattle in her chest with some wheezing   Has inhaler- has not needed it   Caught this from her husband  Patient Active Problem List  Diagnoses  . POSTHERPETIC NEURALGIA  . BRONCHITIS, CHRONIC  . TEMPOROMANDIBULAR JOINT PAIN  . GERD  . RENAL CALCULUS  . Viral URI with cough   Past Medical History  Diagnosis Date  . GERD (gastroesophageal reflux disease)    No past surgical history on file. History  Substance Use Topics  . Smoking status: Never Smoker   . Smokeless tobacco: Not on file  . Alcohol Use: No   Family History  Problem Relation Age of Onset  . Hypertension Mother   . Cancer Mother     LIVER cancer  . Hypertension Father   . Diabetes Father   . Heart failure Father    Allergies  Allergen Reactions  . Penicillins     REACTION: throat swells and rash  . Sulfonamide Derivatives     REACTION: rash   No current outpatient prescriptions on file prior to visit.      Review of Systems Review of Systems  Constitutional: pos  for fever, appetite change, fatigue .  Eyes: Negative for pain and visual disturbance.  ENT neg for sinus pain/ ear drainage Respiratory: Negative for wheeze and sob   Cardiovascular: Negative for cp or palpitations    Gastrointestinal: Negative for nausea, diarrhea and constipation.  Genitourinary: Negative for urgency and frequency.  Skin: Negative for pallor or rash   Neurological: Negative for weakness, light-headedness, numbness and headaches.  Hematological: Negative for adenopathy. Does not bruise/bleed easily.  Psychiatric/Behavioral: Negative for dysphoric mood. The  patient is not nervous/anxious.          Objective:   Physical Exam  Constitutional: She appears well-developed and well-nourished. No distress.  HENT:  Head: Normocephalic and atraumatic.  Right Ear: External ear normal.  Left Ear: External ear normal.  Mouth/Throat: No oropharyngeal exudate.       Nares are injected and congested  No sinus tenderness Throat- post drip  Eyes: Conjunctivae and EOM are normal. Pupils are equal, round, and reactive to light. No scleral icterus.  Neck: Normal range of motion. Neck supple. No JVD present. Carotid bruit is not present. No thyromegaly present.  Cardiovascular: Normal rate, regular rhythm and normal heart sounds.   Pulmonary/Chest: Effort normal and breath sounds normal. No respiratory distress. She has no wheezes. She has no rales. She exhibits no tenderness.       Harsh bs throughout No wheeze even on forced exp  Lymphadenopathy:    She has no cervical adenopathy.  Skin: Skin is warm and dry. No rash noted.  Psychiatric: She has a normal mood and affect.          Assessment & Plan:

## 2011-06-28 NOTE — Telephone Encounter (Signed)
Patient requested Rx to be sent to [facility] with only a 30 day supply. Sent in as requested.

## 2011-06-28 NOTE — Patient Instructions (Signed)
You have a head and chest cold- may still get a bit worse before it gets better  nyquil ok for night Try mucinex DM for day time Drink lots of fluids and try to get extra rest  If high fever or wheezing let us know  Use your inhaler if needed Upper Respiratory Infection, Adult An upper respiratory infection (URI) is also sometimes known as the common cold. The upper respiratory tract includes the nose, sinuses, throat, trachea, and bronchi. Bronchi are the airways leading to the lungs. Most people improve within 1 week, but symptoms can last up to 2 weeks. A residual cough may last even longer.   CAUSES Many different viruses can infect the tissues lining the upper respiratory tract. The tissues become irritated and inflamed and often become very moist. Mucus production is also common. A cold is contagious. You can easily spread the virus to others by oral contact. This includes kissing, sharing a glass, coughing, or sneezing. Touching your mouth or nose and then touching a surface, which is then touched by another person, can also spread the virus. SYMPTOMS   Symptoms typically develop 1 to 3 days after you come in contact with a cold virus. Symptoms vary from person to person. They may include:  Runny nose.     Sneezing.    Nasal congestion.     Sinus irritation.     Sore throat.     Loss of voice (laryngitis).     Cough.    Fatigue.    Muscle aches.     Loss of appetite.     Headache.    Low-grade fever.  DIAGNOSIS   You might diagnose your own cold based on familiar symptoms, since most people get a cold 2 to 3 times a year. Your caregiver can confirm this based on your exam. Most importantly, your caregiver can check that your symptoms are not due to another disease such as strep throat, sinusitis, pneumonia, asthma, or epiglottitis. Blood tests, throat tests, and X-rays are not necessary to diagnose a common cold, but they may sometimes be helpful in excluding other more  serious diseases. Your caregiver will decide if any further tests are required. RISKS AND COMPLICATIONS   You may be at risk for a more severe case of the common cold if you smoke cigarettes, have chronic heart disease (such as heart failure) or lung disease (such as asthma), or if you have a weakened immune system. The very young and very old are also at risk for more serious infections. Bacterial sinusitis, middle ear infections, and bacterial pneumonia can complicate the common cold. The common cold can worsen asthma and chronic obstructive pulmonary disease (COPD). Sometimes, these complications can require emergency medical care and may be life-threatening. PREVENTION   The best way to protect against getting a cold is to practice good hygiene. Avoid oral or hand contact with people with cold symptoms. Wash your hands often if contact occurs. There is no clear evidence that vitamin C, vitamin E, echinacea, or exercise reduces the chance of developing a cold. However, it is always recommended to get plenty of rest and practice good nutrition. TREATMENT   Treatment is directed at relieving symptoms. There is no cure. Antibiotics are not effective, because the infection is caused by a virus, not by bacteria. Treatment may include:  Increased fluid intake. Sports drinks offer valuable electrolytes, sugars, and fluids.     Breathing heated mist or steam (vaporizer or shower).     Eating  chicken soup or other clear broths, and maintaining good nutrition.     Getting plenty of rest.     Using gargles or lozenges for comfort.     Controlling fevers with ibuprofen or acetaminophen as directed by your caregiver.     Increasing usage of your inhaler if you have asthma.  Zinc gel and zinc lozenges, taken in the first 24 hours of the common cold, can shorten the duration and lessen the severity of symptoms. Pain medicines may help with fever, muscle aches, and throat pain. A variety of non-prescription  medicines are available to treat congestion and runny nose. Your caregiver can make recommendations and may suggest nasal or lung inhalers for other symptoms.   HOME CARE INSTRUCTIONS    Only take over-the-counter or prescription medicines for pain, discomfort, or fever as directed by your caregiver.     Use a warm mist humidifier or inhale steam from a shower to increase air moisture. This may keep secretions moist and make it easier to breathe.     Drink enough water and fluids to keep your urine clear or pale yellow.     Rest as needed.     Return to work when your temperature has returned to normal or as your caregiver advises. You may need to stay home longer to avoid infecting others. You can also use a face mask and careful hand washing to prevent spread of the virus.  SEEK MEDICAL CARE IF:    After the first few days, you feel you are getting worse rather than better.     You need your caregiver's advice about medicines to control symptoms.     You develop chills, worsening shortness of breath, or brown or red sputum. These may be signs of pneumonia.     You develop yellow or brown nasal discharge or pain in the face, especially when you bend forward. These may be signs of sinusitis.     You develop a fever, swollen neck glands, pain with swallowing, or white areas in the back of your throat. These may be signs of strep throat.  SEEK IMMEDIATE MEDICAL CARE IF:    You have a fever.     You develop severe or persistent headache, ear pain, sinus pain, or chest pain.     You develop wheezing, a prolonged cough, cough up blood, or have a change in your usual mucus (if you have chronic lung disease).     You develop sore muscles or a stiff neck.  Document Released: 11/09/2000 Document Revised: 01/26/2011 Document Reviewed: 09/17/2010 [facility] Patient Information 2012 [place], [place].

## 2011-06-30 NOTE — Assessment & Plan Note (Signed)
Low grade fever at home, pt did have a flu shot Disc symptomatic care - see instructions on AVS  Update if not starting to improve in a week or if worsening   Given handout

## 2011-07-08 ENCOUNTER — Ambulatory Visit: Payer: MEDICARE | Admitting: Family Medicine

## 2011-10-07 ENCOUNTER — Ambulatory Visit: Payer: Self-pay | Admitting: Family Medicine

## 2011-10-14 ENCOUNTER — Ambulatory Visit: Payer: Self-pay | Admitting: Family Medicine

## 2011-10-21 ENCOUNTER — Ambulatory Visit (INDEPENDENT_AMBULATORY_CARE_PROVIDER_SITE_OTHER): Payer: Medicare Other | Admitting: Family Medicine

## 2011-10-21 ENCOUNTER — Encounter: Payer: Self-pay | Admitting: Family Medicine

## 2011-10-21 VITALS — BP 120/70 | HR 80 | Temp 97.9°F | Ht 59.0 in | Wt 160.1 lb

## 2011-10-21 DIAGNOSIS — B0229 Other postherpetic nervous system involvement: Secondary | ICD-10-CM

## 2011-10-21 DIAGNOSIS — Z1211 Encounter for screening for malignant neoplasm of colon: Secondary | ICD-10-CM

## 2011-10-21 DIAGNOSIS — Z0000 Encounter for general adult medical examination without abnormal findings: Secondary | ICD-10-CM

## 2011-10-21 DIAGNOSIS — Z13220 Encounter for screening for lipoid disorders: Secondary | ICD-10-CM

## 2011-10-21 DIAGNOSIS — M549 Dorsalgia, unspecified: Secondary | ICD-10-CM | POA: Insufficient documentation

## 2011-10-21 DIAGNOSIS — M546 Pain in thoracic spine: Secondary | ICD-10-CM

## 2011-10-21 DIAGNOSIS — Z23 Encounter for immunization: Secondary | ICD-10-CM

## 2011-10-21 MED ORDER — AMITRIPTYLINE HCL 25 MG PO TABS: 25.0000 mg | ORAL_TABLET | Freq: Every day | ORAL | Status: DC

## 2011-10-21 NOTE — Assessment & Plan Note (Signed)
Inadequate control on gabapentin. Will add amitriptyline at bedtime, may titrate up.  Follow up in 1 month to re-eval.

## 2011-10-21 NOTE — Patient Instructions (Addendum)
Look into shingles and TDap vaccine coverage.. Let us know if interested. Add amitriptyline to regimen may increase to 2 tabs at bedtime if not helping enough after 1 week. Follow up in 1 months post herpetic neuralgia.  Make appt with GYN for pelvic and breast exam... Have them schedule mammogram and bone density  Stop at front desk to set up colonoscopy. Return for fasting labs for cholesterol and DM. Treat upper back pain with stretching, ibuprofen as needed , heat, and massage.

## 2011-10-21 NOTE — Progress Notes (Signed)
Subjective:    Patient ID: Katie Zimmerman, female    DOB: April 06, 1946, 66 y.o.   MRN: [id number]  HPI Welcome to Medicare: I have personally reviewed the Medicare Annual Wellness questionnaire and have noted 1. The patient's medical and social history 2. Their use of alcohol, tobacco or illicit drugs 3. Their current medications and supplements 4. The patient's functional ability including ADL's, fall risks, home safety risks and hearing or visual             impairment. 5. Diet and physical activities 6. Evidence for depression or mood disorders The patients weight, height, BMI and visual acuity have been recorded in the chart I have made referrals, counseling and provided education to the patient based review of the above and I have provided the pt with a written personalized care plan for preventive services.  Overall feeling well.  Postherpetic neuralgia Dx 1999, only moderate control on gabapentin.  On fairly high dose, 600 mg TID. No sedation, occ dizziness. Lyrica helped initially, but stopped helping. Never tried elavil in past. Dx with Dr. Hetty Ely. Saw Neurologist for this as well.  Upper back pain, intermittantly, nothing makes it worse, ache.   No fall no injury: Using iubuprofen for pain, helps, sitting helps.   Review of Systems  Constitutional: Negative for fever, fatigue and unexpected weight change.  HENT: Negative for ear pain, congestion, sore throat, sneezing, trouble swallowing and sinus pressure.   Eyes: Negative for pain and itching.  Respiratory: Negative for cough, shortness of breath and wheezing.   Cardiovascular: Negative for chest pain, palpitations and leg swelling.  Gastrointestinal: Negative for nausea, abdominal pain, diarrhea, constipation and blood in stool.  Genitourinary: Negative for dysuria, hematuria, vaginal discharge, difficulty urinating and menstrual problem.  Skin: Negative for rash.  Neurological: Negative for syncope, weakness,  light-headedness, numbness and headaches.  Psychiatric/Behavioral: Negative for confusion and dysphoric mood. The patient is not nervous/anxious.        Objective:   Physical Exam  Constitutional: Vital signs are normal. She appears well-developed and well-nourished. She is cooperative.  Non-toxic appearance. She does not appear ill. No distress.  HENT:  Head: Normocephalic.  Right Ear: Hearing, tympanic membrane, external ear and ear canal normal.  Left Ear: Hearing, tympanic membrane, external ear and ear canal normal.  Nose: Nose normal.  Eyes: Conjunctivae, EOM and lids are normal. Pupils are equal, round, and reactive to light. No foreign bodies found.  Neck: Trachea normal and normal range of motion. Neck supple. Carotid bruit is not present. No mass and no thyromegaly present.  Cardiovascular: Normal rate, regular rhythm, S1 normal, S2 normal, normal heart sounds and intact distal pulses.  Exam reveals no gallop.   No murmur heard. Pulmonary/Chest: Effort normal and breath sounds normal. No respiratory distress. She has no wheezes. She has no rhonchi. She has no rales.  Abdominal: Soft. Normal appearance and bowel sounds are normal. She exhibits no distension, no fluid wave, no abdominal bruit and no mass. There is no hepatosplenomegaly. There is no tenderness. There is no rebound, no guarding and no CVA tenderness. No hernia.  Musculoskeletal:       Cervical back: Normal.       Thoracic back: She exhibits tenderness. She exhibits normal range of motion and no bony tenderness.       Lumbar back: Normal.       ttp over B paraspinous muscles B.. Likely spasm  Lymphadenopathy:    She has no cervical adenopathy.  She has no axillary adenopathy.  Neurological: She is alert. She has normal strength. No cranial nerve deficit or sensory deficit.  Skin: Skin is warm, dry and intact. No rash noted.  Psychiatric: Her speech is normal and behavior is normal. Judgment normal. Her mood  appears not anxious. Cognition and memory are normal. She does not exhibit a depressed mood.          Assessment & Plan:  The patient's preventative maintenance and recommended screening tests for an annual wellness exam were reviewed in full today. Brought up to date unless services declined.  Counselled on the importance of diet, exercise, and its role in overall health and mortality. The patient's FH and SH was reviewed, including their home life, tobacco status, and drug and alcohol status.   Vaccines:  Due for TDap, shingles and PNA, will look into and given PNA today PAP/DVE: Plan to se GYN DEXA:last at gyn 4 years ago.. Will do at GYN Mammogram: Due .Marland Kitchen Will do at GYN Colon: Never had colonoscopy.  Due for lab eval of cholesterol , DM etc.

## 2011-10-21 NOTE — Assessment & Plan Note (Signed)
Treat with heat massage, gentle stretching, ibuprofen prn .

## 2011-10-25 ENCOUNTER — Encounter: Payer: Self-pay | Admitting: Internal Medicine

## 2011-10-25 ENCOUNTER — Other Ambulatory Visit: Payer: Medicare Other

## 2011-10-27 ENCOUNTER — Other Ambulatory Visit (INDEPENDENT_AMBULATORY_CARE_PROVIDER_SITE_OTHER): Payer: Medicare Other

## 2011-10-27 DIAGNOSIS — Z13220 Encounter for screening for lipoid disorders: Secondary | ICD-10-CM

## 2011-10-27 DIAGNOSIS — Z79899 Other long term (current) drug therapy: Secondary | ICD-10-CM

## 2011-10-27 LAB — LIPID PANEL
Cholesterol: 233 mg/dL — ABNORMAL HIGH (ref 0–200)
HDL: 53.4 mg/dL (ref >39.00)
Total CHOL/HDL Ratio: 4
Triglycerides: 117 mg/dL (ref 0.0–149.0)
VLDL: 23.4 mg/dL (ref 0.0–40.0)

## 2011-10-27 LAB — COMPREHENSIVE METABOLIC PANEL
ALT: 13 U/L (ref 0–35)
AST: 15 U/L (ref 0–37)
Albumin: 4.1 g/dL (ref 3.5–5.2)
Alkaline Phosphatase: 82 U/L (ref 39–117)
BUN: 14 mg/dL (ref 6–23)
CO2: 26 mEq/L (ref 19–32)
Calcium: 9.3 mg/dL (ref 8.4–10.5)
Chloride: 108 mEq/L (ref 96–112)
Creatinine, Ser: 0.9 mg/dL (ref 0.4–1.2)
GFR: 70.21 mL/min (ref >60.00)
Glucose, Bld: 124 mg/dL — ABNORMAL HIGH (ref 70–99)
Potassium: 4.1 mEq/L (ref 3.5–5.1)
Sodium: 142 mEq/L (ref 135–145)
Total Bilirubin: 0.6 mg/dL (ref 0.3–1.2)
Total Protein: 7.6 g/dL (ref 6.0–8.3)

## 2011-10-27 LAB — LDL CHOLESTEROL, DIRECT: Direct LDL: 158.7 mg/dL

## 2011-10-28 ENCOUNTER — Other Ambulatory Visit: Payer: Self-pay | Admitting: *Deleted

## 2011-10-28 MED ORDER — GABAPENTIN 300 MG PO CAPS: ORAL_CAPSULE | ORAL | Status: DC

## 2011-11-14 ENCOUNTER — Other Ambulatory Visit: Payer: Self-pay | Admitting: Family Medicine

## 2011-11-14 DIAGNOSIS — R7309 Other abnormal glucose: Secondary | ICD-10-CM

## 2011-11-15 ENCOUNTER — Other Ambulatory Visit: Payer: Medicare Other

## 2011-11-16 ENCOUNTER — Other Ambulatory Visit (INDEPENDENT_AMBULATORY_CARE_PROVIDER_SITE_OTHER): Payer: Medicare Other

## 2011-11-16 DIAGNOSIS — R7309 Other abnormal glucose: Secondary | ICD-10-CM

## 2011-11-16 LAB — GLUCOSE, RANDOM: Glucose, Bld: 105 mg/dL — ABNORMAL HIGH (ref 70–99)

## 2011-11-16 LAB — HEMOGLOBIN A1C: Hgb A1c MFr Bld: 6 % (ref 4.6–6.5)

## 2011-11-21 ENCOUNTER — Encounter: Payer: Self-pay | Admitting: Internal Medicine

## 2011-11-21 ENCOUNTER — Ambulatory Visit (AMBULATORY_SURGERY_CENTER): Payer: Medicare Other | Admitting: *Deleted

## 2011-11-21 VITALS — Ht 59.0 in | Wt 160.0 lb

## 2011-11-21 DIAGNOSIS — Z1211 Encounter for screening for malignant neoplasm of colon: Secondary | ICD-10-CM

## 2011-11-21 MED ORDER — MOVIPREP 100 G PO SOLR: ORAL | Status: DC

## 2011-11-22 ENCOUNTER — Encounter: Payer: Self-pay | Admitting: Family Medicine

## 2011-11-22 ENCOUNTER — Ambulatory Visit (INDEPENDENT_AMBULATORY_CARE_PROVIDER_SITE_OTHER): Payer: Medicare Other | Admitting: Family Medicine

## 2011-11-22 VITALS — BP 130/80 | HR 95 | Temp 98.8°F | Ht 59.0 in | Wt 163.0 lb

## 2011-11-22 DIAGNOSIS — R7303 Prediabetes: Secondary | ICD-10-CM | POA: Insufficient documentation

## 2011-11-22 DIAGNOSIS — R7309 Other abnormal glucose: Secondary | ICD-10-CM

## 2011-11-22 DIAGNOSIS — M549 Dorsalgia, unspecified: Secondary | ICD-10-CM

## 2011-11-22 DIAGNOSIS — E7800 Pure hypercholesterolemia, unspecified: Secondary | ICD-10-CM

## 2011-11-22 DIAGNOSIS — E119 Type 2 diabetes mellitus without complications: Secondary | ICD-10-CM | POA: Insufficient documentation

## 2011-11-22 DIAGNOSIS — B0229 Other postherpetic nervous system involvement: Secondary | ICD-10-CM

## 2011-11-22 DIAGNOSIS — M546 Pain in thoracic spine: Secondary | ICD-10-CM

## 2011-11-22 HISTORY — DX: Prediabetes: R73.03

## 2011-11-22 HISTORY — DX: Pure hypercholesterolemia, unspecified: E78.00

## 2011-11-22 MED ORDER — AMITRIPTYLINE HCL 25 MG PO TABS: ORAL_TABLET | ORAL | Status: DC

## 2011-11-22 NOTE — Assessment & Plan Note (Signed)
Increase amitryptiline. Continue gabapentin.

## 2011-11-22 NOTE — Assessment & Plan Note (Signed)
Resolved

## 2011-11-22 NOTE — Progress Notes (Signed)
  Subjective:    Patient ID: Katie Zimmerman, female    DOB: [birth year], 66 y.o.   MRN: [id number]  HPI 66 year old female presents for postherpetic neuralgia On gabapentin, recently added amitryptiline at bedtime.. In last month.  Pt reports less pain at night from neuralgia, but reports only 5 % improvement in pain. No side effects from the elavil.  Back pain, upper back: resolved. Only required 3 ibuprofen.  Review of Systems  Constitutional: Negative for fever and fatigue.  HENT: Negative for ear pain.   Eyes: Negative for pain.  Respiratory: Negative for chest tightness and shortness of breath.   Cardiovascular: Negative for chest pain, palpitations and leg swelling.  Gastrointestinal: Negative for abdominal pain.  Genitourinary: Negative for dysuria.       Objective:   Physical Exam  Constitutional: Vital signs are normal. She appears well-developed and well-nourished. She is cooperative.  Non-toxic appearance. She does not appear ill. No distress.  HENT:  Head: Normocephalic.  Right Ear: Hearing, tympanic membrane, external ear and ear canal normal. Tympanic membrane is not erythematous, not retracted and not bulging.  Left Ear: Hearing, tympanic membrane, external ear and ear canal normal. Tympanic membrane is not erythematous, not retracted and not bulging.  Nose: No mucosal edema or rhinorrhea. Right sinus exhibits no maxillary sinus tenderness and no frontal sinus tenderness. Left sinus exhibits no maxillary sinus tenderness and no frontal sinus tenderness.  Mouth/Throat: Uvula is midline, oropharynx is clear and moist and mucous membranes are normal.  Eyes: Conjunctivae, EOM and lids are normal. Pupils are equal, round, and reactive to light. No foreign bodies found.  Neck: Trachea normal and normal range of motion. Neck supple. Carotid bruit is not present. No mass and no thyromegaly present.  Cardiovascular: Normal rate, regular rhythm, S1 normal, S2 normal, normal heart  sounds, intact distal pulses and normal pulses.  Exam reveals no gallop and no friction rub.   No murmur heard. Pulmonary/Chest: Effort normal and breath sounds normal. Not tachypneic. No respiratory distress. She has no decreased breath sounds. She has no wheezes. She has no rhonchi. She has no rales.  Abdominal: Soft. Normal appearance and bowel sounds are normal. There is no tenderness.  Neurological: She is alert.  Skin: Skin is warm, dry and intact. No rash noted.  Psychiatric: Her speech is normal and behavior is normal. Judgment and thought content normal. Her mood appears not anxious. Cognition and memory are normal. She does not exhibit a depressed mood.          Assessment & Plan:

## 2011-11-22 NOTE — Assessment & Plan Note (Signed)
Encouraged exercise, weight loss, healthy eating habits. ? ?

## 2011-11-22 NOTE — Assessment & Plan Note (Signed)
Poor control.. Above goal LDL 130. Counsled on lifestyle change. Refused nutritionist.

## 2011-11-22 NOTE — Patient Instructions (Addendum)
Increase amitryptiline to 2 tabs at bedtime, if no SE and improvement can consider increasing further after 2-3 weeks.  Work on weight loss, regular exercise and low carbohydrate diet.  Also work on low cholesterol diet. Follow up in 6 months with fasting labs prior.

## 2011-12-05 ENCOUNTER — Other Ambulatory Visit: Payer: Self-pay | Admitting: Gynecology

## 2011-12-05 ENCOUNTER — Telehealth: Payer: Self-pay | Admitting: Internal Medicine

## 2011-12-05 DIAGNOSIS — R928 Other abnormal and inconclusive findings on diagnostic imaging of breast: Secondary | ICD-10-CM

## 2011-12-05 NOTE — Telephone Encounter (Signed)
No charge. 

## 2011-12-06 ENCOUNTER — Encounter: Payer: Medicare Other | Admitting: Internal Medicine

## 2011-12-09 ENCOUNTER — Ambulatory Visit
Admission: RE | Admit: 2011-12-09 | Discharge: 2011-12-09 | Disposition: A | Discharge status: Home / Self Care | Payer: Medicare Other | Source: Ambulatory Visit | Attending: Gynecology | Admitting: Gynecology

## 2011-12-09 ENCOUNTER — Other Ambulatory Visit: Payer: Self-pay | Admitting: Gynecology

## 2011-12-09 DIAGNOSIS — R928 Other abnormal and inconclusive findings on diagnostic imaging of breast: Secondary | ICD-10-CM

## 2011-12-13 ENCOUNTER — Encounter: Payer: Medicare Other | Admitting: Internal Medicine

## 2011-12-23 ENCOUNTER — Telehealth: Payer: Self-pay | Admitting: Internal Medicine

## 2011-12-27 ENCOUNTER — Encounter: Payer: Medicare Other | Admitting: Internal Medicine

## 2011-12-27 NOTE — Telephone Encounter (Signed)
No charge. 

## 2011-12-29 DIAGNOSIS — C50919 Malignant neoplasm of unspecified site of unspecified female breast: Secondary | ICD-10-CM

## 2011-12-29 HISTORY — DX: Malignant neoplasm of unspecified site of unspecified female breast: C50.919

## 2012-01-10 ENCOUNTER — Other Ambulatory Visit: Payer: Self-pay | Admitting: Gynecology

## 2012-01-10 ENCOUNTER — Ambulatory Visit
Admission: RE | Admit: 2012-01-10 | Discharge: 2012-01-10 | Disposition: A | Discharge status: Home / Self Care | Payer: Medicare Other | Source: Ambulatory Visit | Attending: Gynecology | Admitting: Gynecology

## 2012-01-10 DIAGNOSIS — R928 Other abnormal and inconclusive findings on diagnostic imaging of breast: Secondary | ICD-10-CM

## 2012-01-10 HISTORY — PX: BREAST BIOPSY: SHX20

## 2012-01-10 HISTORY — PX: BREAST CYST ASPIRATION: SHX578

## 2012-01-10 HISTORY — PX: BIOPSY BREAST: PRO8

## 2012-01-11 ENCOUNTER — Ambulatory Visit
Admission: RE | Admit: 2012-01-11 | Discharge: 2012-01-11 | Disposition: A | Discharge status: Home / Self Care | Payer: Medicare Other | Source: Ambulatory Visit | Attending: Gynecology | Admitting: Gynecology

## 2012-01-11 ENCOUNTER — Other Ambulatory Visit: Payer: Self-pay | Admitting: Gynecology

## 2012-01-11 DIAGNOSIS — R928 Other abnormal and inconclusive findings on diagnostic imaging of breast: Secondary | ICD-10-CM

## 2012-01-11 DIAGNOSIS — C50911 Malignant neoplasm of unspecified site of right female breast: Secondary | ICD-10-CM

## 2012-01-16 ENCOUNTER — Encounter (INDEPENDENT_AMBULATORY_CARE_PROVIDER_SITE_OTHER): Payer: Medicare Other | Admitting: General Surgery

## 2012-01-16 ENCOUNTER — Other Ambulatory Visit: Payer: Medicare Other

## 2012-01-17 ENCOUNTER — Ambulatory Visit
Admission: RE | Admit: 2012-01-17 | Discharge: 2012-01-17 | Disposition: A | Discharge status: Home / Self Care | Payer: Medicare Other | Source: Ambulatory Visit | Attending: Gynecology | Admitting: Gynecology

## 2012-01-17 ENCOUNTER — Encounter: Payer: Medicare Other | Admitting: Internal Medicine

## 2012-01-17 DIAGNOSIS — C50911 Malignant neoplasm of unspecified site of right female breast: Secondary | ICD-10-CM

## 2012-01-17 MED ORDER — GADOBENATE DIMEGLUMINE 529 MG/ML IV SOLN
15.0000 mL | Freq: Once | INTRAVENOUS | Status: AC | PRN
  Administered 2012-01-17: 15 mL via INTRAVENOUS

## 2012-01-20 ENCOUNTER — Encounter (INDEPENDENT_AMBULATORY_CARE_PROVIDER_SITE_OTHER): Payer: Self-pay | Admitting: General Surgery

## 2012-01-20 ENCOUNTER — Other Ambulatory Visit (INDEPENDENT_AMBULATORY_CARE_PROVIDER_SITE_OTHER): Payer: Self-pay | Admitting: General Surgery

## 2012-01-20 ENCOUNTER — Ambulatory Visit (INDEPENDENT_AMBULATORY_CARE_PROVIDER_SITE_OTHER): Payer: Medicare Other | Admitting: General Surgery

## 2012-01-20 ENCOUNTER — Encounter (HOSPITAL_BASED_OUTPATIENT_CLINIC_OR_DEPARTMENT_OTHER): Payer: Self-pay | Admitting: *Deleted

## 2012-01-20 VITALS — BP 138/80 | HR 72 | Temp 97.4°F | Resp 16 | Ht 59.0 in | Wt 162.4 lb

## 2012-01-20 DIAGNOSIS — C50519 Malignant neoplasm of lower-outer quadrant of unspecified female breast: Secondary | ICD-10-CM

## 2012-01-20 NOTE — Progress Notes (Signed)
To come in for labs  

## 2012-01-20 NOTE — Progress Notes (Signed)
Patient ID: Katie Zimmerman, female   DOB: 06/29/1945, 66 y.o.   MRN: [id number]  Chief Complaint  Patient presents with  . Breast Cancer    HPI Dlynn T Hiraldo is a 66 y.o. female.  Referred by Dr. Elizabeth Eagle HPI This is a 66-year-old female who presents after undergoing a screening mammogram with a right-sided abnormality. She also left-sided abnormality that was a cyst that was aspirated. She has no prior history of any breast complaints at all. The mass on ultrasound was 9 x 8 x 5 mm. Since then she has also undergone an MRI which shows no other abnormalities it shows a 2 x 1.6 x 1 cm area of abnormality. This is 100% estrogen and progesterone receptor positive. 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 The carcinoma. Low grade. The specimen is very fragmented but there is no definite invasive disease although there is some concern. She comes in today to discuss all of her options for treatment of this to be diagnosed breast cancer. She has no complaints referable to her breasts. She does have a family history in 2 cousins and an aunt with breast cancer. No one has ever had any genetic testing. Past Medical History  Diagnosis Date  . GERD (gastroesophageal reflux disease)   . Osteoporosis   . Nerve damage 2000    Right side of face after Shingles  . Cancer     breast    Past Surgical History  Procedure Date  . Ovarian cyst surgery 1964  . Tubal ligation 1976  . Foot surgery 1996  . Glaucoma surgery 1993    Family History  Problem Relation Age of Onset  . Hypertension Mother   . Cancer Mother     LIVER cancer  . Hypertension Father   . Diabetes Father   . Heart failure Father     Social History History  Substance Use Topics  . Smoking status: Never Smoker   . Smokeless tobacco: Never Used  . Alcohol Use: Yes     occasional glass of wine    Allergies  Allergen Reactions  . Penicillins     REACTION: throat swells and rash  . Sulfonamide Derivatives    REACTION: rash    Current Outpatient Prescriptions  Medication Sig Dispense Refill  . amitriptyline (ELAVIL) 25 MG tablet Increase to 2 tabs daily, may increase further to 75-100 mg daily if no side effects but not reached maximal benefit.  60 tablet  5  . Calcium Carbonate-Vit D-Min (CALTRATE 600+D PLUS MINERALS) 600-800 MG-UNIT TABS Take 2 tablets by mouth 2 (two) times daily.      . gabapentin (NEURONTIN) 300 MG capsule Take 2 capsules by mouth three times a day  180 capsule  3  . MOVIPREP 100 G SOLR MOVI PREP take as directed no substitution  1 kit  0    Review of Systems Review of Systems  Constitutional: Negative for fever, chills and unexpected weight change.  HENT: Negative for hearing loss, congestion, sore throat, trouble swallowing and voice change.   Eyes: Negative for visual disturbance.  Respiratory: Negative for cough and wheezing.   Cardiovascular: Negative for chest pain, palpitations and leg swelling.  Gastrointestinal: Negative for nausea, vomiting, abdominal pain, diarrhea, constipation, blood in stool, abdominal distention and anal bleeding.  Genitourinary: Negative for hematuria, vaginal bleeding and difficulty urinating.  Musculoskeletal: Negative for arthralgias.  Skin: Negative for rash and wound.  Neurological: Negative for seizures, syncope and headaches.  Hematological: Negative for   adenopathy. Does not bruise/bleed easily.  Psychiatric/Behavioral: Negative for confusion.    Blood pressure 138/80, pulse 72, temperature 97.4 F (36.3 C), temperature source Temporal, resp. rate 16, height 4' 11" (1.499 m), weight 162 lb 6 oz (73.653 kg).  Physical Exam Physical Exam  Vitals reviewed. Constitutional: She appears well-developed and well-nourished.  Neck: Neck supple.  Cardiovascular: Normal rate, regular rhythm and normal heart sounds.   Pulmonary/Chest: Effort normal and breath sounds normal. She has no wheezes. She has no rales. Right breast exhibits no  inverted nipple, no mass, no nipple discharge, no skin change and no tenderness. Left breast exhibits no inverted nipple, no mass, no nipple discharge, no skin change and no tenderness. Breasts are symmetrical.  Lymphadenopathy:    She has no cervical adenopathy.    Data Reviewed BILATERAL BREAST MRI WITH AND WITHOUT CONTRAST  Technique: Multiplanar, multisequence MR images of both breasts  were obtained prior to and following the intravenous administration  of 15ml of Multihance. Three dimensional images were evaluated at  the independent DynaCad workstation.  Comparison: [facility]amm[facility] and [facility] 01/10/2012 and earlier  Findings: Background parenchymal enhancement is moderate. Within  the lower outer quadrant of the right breast, there is an area of  non mass persistent type enhancement kinetics. This measures 2.0 x  1.6 x 1.0 cm and correlates with clip artifact following recent  ultrasound guided core biopsy which showed breast malignancy. No  other areas of concern identified in the right breast. Images of  the left breast are unremarkable.  IMPRESSION:  Solitary area of abnormal enhancement, 2.0 cm within the lower  outer quadrant of the right breast, consistent with known  malignancy.   Assessment    Right breast cancer    Plan    Right breast wire guided lumpectomy, right axillary sentinel node biopsy She has ductal carcinoma on her path.  In discussion with pathology there is no definite invasion.  It is difficult to tell due to the fragmentation though. There is some concern that there is an invasive nature to this tumor. I had a long discussion with her today about whether or not to proceed with a sentinel lymph node. I think we can go either way. After a long conversation about the risks and indications for said that lymph node biopsy she would like to proceed with this at the same time. We also discussed possible genetics  referral at some point  We discussed the staging and pathophysiology of breast cancer. We discussed all of the different options for treatment for breast cancer including surgery, chemotherapy, radiation therapy, Herceptin, and antiestrogen therapy.   We discussed a sentinel lymph node biopsy as she does not appear to having lymph node involvement right now. We discussed the performance of that with injection of radioactive tracer and blue dye. We discussed that she would have an incision underneath her axillary hairline. We discussed that there is a bout a 10-20% chance of having a positive node with a sentinel lymph node biopsy and we will await the permanent pathology to make any other first further decisions in terms of her treatment. One of these options might be to return to the operating room to perform an axillary lymph node dissection. We discussed about a 1-2% risk lifetime of chronic shoulder pain as well as lymphedema associated with a sentinel lymph node biopsy.  We discussed the options for treatment of the breast cancer which included lumpectomy versus a mastectomy. We   discussed the performance of the lumpectomy with a wire placement. We discussed a 10-20% chance of a positive margin requiring reexcision in the operating room. We also discussed that she may need radiation therapy or antiestrogen therapy or both if she undergoes lumpectomy. We discussed the mastectomy and the postoperative care for that as well. We discussed that there is no difference in her survival whether she undergoes lumpectomy with radiation therapy or antiestrogen therapy versus a mastectomy. There is a slight difference in the local recurrence rate being 3-5% with lumpectomy and about 1% with a mastectomy. We discussed the risks of operation including bleeding, infection, possible reoperation. She understands her further therapy will be based on what her stages at the time of her operation.         Tieshia Rettinger 01/20/2012, 8:37 AM    

## 2012-01-25 ENCOUNTER — Encounter (HOSPITAL_BASED_OUTPATIENT_CLINIC_OR_DEPARTMENT_OTHER)
Admission: RE | Admit: 2012-01-25 | Discharge: 2012-01-25 | Disposition: A | Discharge status: Home / Self Care | Payer: Medicare Other | Source: Ambulatory Visit | Attending: General Surgery | Admitting: General Surgery

## 2012-01-25 LAB — CANCER ANTIGEN 27.29: CA 27.29: 44 U/mL — ABNORMAL HIGH (ref 0–39)

## 2012-01-25 LAB — CBC WITH DIFFERENTIAL/PLATELET
Basophils Absolute: 0.1 10*3/uL (ref 0.0–0.1)
Basophils Relative: 1 % (ref 0–1)
Eosinophils Absolute: 0.2 10*3/uL (ref 0.0–0.7)
Eosinophils Relative: 2 % (ref 0–5)
HCT: 42.9 % (ref 36.0–46.0)
Hemoglobin: 14.5 g/dL (ref 12.0–15.0)
Lymphocytes Relative: 28 % (ref 12–46)
Lymphs Abs: 2.2 10*3/uL (ref 0.7–4.0)
MCH: 30.2 pg (ref 26.0–34.0)
MCHC: 33.8 g/dL (ref 30.0–36.0)
MCV: 89.4 fL (ref 78.0–100.0)
Monocytes Absolute: 0.5 10*3/uL (ref 0.1–1.0)
Monocytes Relative: 6 % (ref 3–12)
Neutro Abs: 5 10*3/uL (ref 1.7–7.7)
Neutrophils Relative %: 63 % (ref 43–77)
Platelets: 346 10*3/uL (ref 150–400)
RBC: 4.8 MIL/uL (ref 3.87–5.11)
RDW: 12.9 % (ref 11.5–15.5)
WBC: 7.9 10*3/uL (ref 4.0–10.5)

## 2012-01-25 LAB — BASIC METABOLIC PANEL
BUN: 14 mg/dL (ref 6–23)
CO2: 24 mEq/L (ref 19–32)
Calcium: 10 mg/dL (ref 8.4–10.5)
Chloride: 104 mEq/L (ref 96–112)
Creatinine, Ser: 0.84 mg/dL (ref 0.50–1.10)
GFR calc Af Amer: 82 mL/min — ABNORMAL LOW (ref >90)
GFR calc non Af Amer: 71 mL/min — ABNORMAL LOW (ref >90)
Glucose, Bld: 99 mg/dL (ref 70–99)
Potassium: 4.1 mEq/L (ref 3.5–5.1)
Sodium: 139 mEq/L (ref 135–145)

## 2012-01-26 ENCOUNTER — Ambulatory Visit (HOSPITAL_BASED_OUTPATIENT_CLINIC_OR_DEPARTMENT_OTHER): Payer: Medicare Other | Admitting: Certified Registered"

## 2012-01-26 ENCOUNTER — Ambulatory Visit
Admission: RE | Admit: 2012-01-26 | Discharge: 2012-01-26 | Disposition: A | Discharge status: Home / Self Care | Payer: Medicare Other | Source: Ambulatory Visit | Attending: General Surgery | Admitting: General Surgery

## 2012-01-26 ENCOUNTER — Ambulatory Visit (HOSPITAL_BASED_OUTPATIENT_CLINIC_OR_DEPARTMENT_OTHER)
Admission: RE | Admit: 2012-01-26 | Discharge: 2012-01-26 | Disposition: A | Discharge status: Home / Self Care | Payer: Medicare Other | Source: Ambulatory Visit | Attending: General Surgery | Admitting: General Surgery

## 2012-01-26 ENCOUNTER — Encounter (HOSPITAL_BASED_OUTPATIENT_CLINIC_OR_DEPARTMENT_OTHER): Payer: Self-pay | Admitting: Certified Registered"

## 2012-01-26 ENCOUNTER — Encounter (HOSPITAL_BASED_OUTPATIENT_CLINIC_OR_DEPARTMENT_OTHER)
Admission: RE | Disposition: A | Discharge status: Home / Self Care | Payer: Self-pay | Source: Ambulatory Visit | Attending: General Surgery

## 2012-01-26 ENCOUNTER — Encounter (HOSPITAL_BASED_OUTPATIENT_CLINIC_OR_DEPARTMENT_OTHER): Payer: Self-pay | Admitting: *Deleted

## 2012-01-26 ENCOUNTER — Other Ambulatory Visit (INDEPENDENT_AMBULATORY_CARE_PROVIDER_SITE_OTHER): Payer: Self-pay | Admitting: General Surgery

## 2012-01-26 ENCOUNTER — Ambulatory Visit (HOSPITAL_COMMUNITY)
Admission: RE | Admit: 2012-01-26 | Discharge: 2012-01-26 | Disposition: A | Discharge status: Home / Self Care | Payer: Medicare Other | Source: Ambulatory Visit | Attending: General Surgery | Admitting: General Surgery

## 2012-01-26 DIAGNOSIS — D0591 Unspecified type of carcinoma in situ of right breast: Secondary | ICD-10-CM | POA: Insufficient documentation

## 2012-01-26 DIAGNOSIS — C50519 Malignant neoplasm of lower-outer quadrant of unspecified female breast: Secondary | ICD-10-CM

## 2012-01-26 DIAGNOSIS — D0590 Unspecified type of carcinoma in situ of unspecified breast: Secondary | ICD-10-CM | POA: Insufficient documentation

## 2012-01-26 DIAGNOSIS — K219 Gastro-esophageal reflux disease without esophagitis: Secondary | ICD-10-CM | POA: Insufficient documentation

## 2012-01-26 DIAGNOSIS — Z880 Allergy status to penicillin: Secondary | ICD-10-CM | POA: Insufficient documentation

## 2012-01-26 DIAGNOSIS — M810 Age-related osteoporosis without current pathological fracture: Secondary | ICD-10-CM | POA: Insufficient documentation

## 2012-01-26 HISTORY — DX: Other complications of anesthesia, initial encounter: T88.59XA

## 2012-01-26 HISTORY — PX: BREAST LUMPECTOMY: SHX2

## 2012-01-26 HISTORY — DX: Unspecified type of carcinoma in situ of right breast: D05.91

## 2012-01-26 HISTORY — DX: Adverse effect of unspecified anesthetic, initial encounter: T41.45XA

## 2012-01-26 SURGERY — BREAST LUMPECTOMY WITH NEEDLE LOCALIZATION AND AXILLARY SENTINEL LYMPH NODE BX
Anesthesia: General | Site: Breast | Laterality: Right | Wound class: Clean

## 2012-01-26 MED ORDER — VANCOMYCIN HCL IN DEXTROSE 1-5 GM/200ML-% IV SOLN
1000.0000 mg | INTRAVENOUS | Status: AC
  Administered 2012-01-26: 1000 mg via INTRAVENOUS

## 2012-01-26 MED ORDER — PROPOFOL 10 MG/ML IV BOLUS
INTRAVENOUS | Status: DC | PRN
  Administered 2012-01-26: 150 mg via INTRAVENOUS

## 2012-01-26 MED ORDER — METOCLOPRAMIDE HCL 5 MG/ML IJ SOLN: 10.0000 mg | Freq: Once | INTRAMUSCULAR | Status: DC | PRN

## 2012-01-26 MED ORDER — SCOPOLAMINE 1 MG/3DAYS TD PT72
1.0000 | MEDICATED_PATCH | Freq: Once | TRANSDERMAL | Status: DC
  Administered 2012-01-26: 1.5 mg via TRANSDERMAL

## 2012-01-26 MED ORDER — OXYCODONE HCL 5 MG PO TABS
5.0000 mg | ORAL_TABLET | Freq: Once | ORAL | Status: AC | PRN
  Administered 2012-01-26: 5 mg via ORAL

## 2012-01-26 MED ORDER — FENTANYL CITRATE 0.05 MG/ML IJ SOLN
50.0000 ug | INTRAMUSCULAR | Status: DC | PRN
  Administered 2012-01-26: 100 ug via INTRAVENOUS

## 2012-01-26 MED ORDER — MIDAZOLAM HCL 2 MG/2ML IJ SOLN
1.0000 mg | INTRAMUSCULAR | Status: DC | PRN
  Administered 2012-01-26: 2 mg via INTRAVENOUS

## 2012-01-26 MED ORDER — HYDROMORPHONE HCL PF 1 MG/ML IJ SOLN
0.2500 mg | INTRAMUSCULAR | Status: DC | PRN
  Administered 2012-01-26 (×2): 0.5 mg via INTRAVENOUS

## 2012-01-26 MED ORDER — DEXAMETHASONE SODIUM PHOSPHATE 4 MG/ML IJ SOLN
INTRAMUSCULAR | Status: DC | PRN
  Administered 2012-01-26: 10 mg via INTRAVENOUS

## 2012-01-26 MED ORDER — OXYCODONE HCL 5 MG/5ML PO SOLN: 5.0000 mg | Freq: Once | ORAL | Status: AC | PRN

## 2012-01-26 MED ORDER — TECHNETIUM TC 99M SULFUR COLLOID FILTERED
1.0000 | Freq: Once | INTRAVENOUS | Status: AC | PRN
  Administered 2012-01-26: 1 via INTRADERMAL

## 2012-01-26 MED ORDER — OXYCODONE-ACETAMINOPHEN 5-325 MG PO TABS: 1.0000 | ORAL_TABLET | ORAL | Status: AC | PRN

## 2012-01-26 MED ORDER — FENTANYL CITRATE 0.05 MG/ML IJ SOLN
INTRAMUSCULAR | Status: DC | PRN
  Administered 2012-01-26: 25 ug via INTRAVENOUS

## 2012-01-26 MED ORDER — ONDANSETRON HCL 4 MG/2ML IJ SOLN
INTRAMUSCULAR | Status: DC | PRN
  Administered 2012-01-26: 4 mg via INTRAVENOUS

## 2012-01-26 MED ORDER — LIDOCAINE HCL (CARDIAC) 20 MG/ML IV SOLN
INTRAVENOUS | Status: DC | PRN
  Administered 2012-01-26: 60 mg via INTRAVENOUS

## 2012-01-26 MED ORDER — LACTATED RINGERS IV SOLN
INTRAVENOUS | Status: DC
  Administered 2012-01-26: 10:00:00 via INTRAVENOUS

## 2012-01-26 MED ORDER — ACETAMINOPHEN 10 MG/ML IV SOLN
1000.0000 mg | Freq: Once | INTRAVENOUS | Status: AC
  Administered 2012-01-26: 1000 mg via INTRAVENOUS

## 2012-01-26 MED ORDER — BUPIVACAINE HCL (PF) 0.25 % IJ SOLN
INTRAMUSCULAR | Status: DC | PRN
  Administered 2012-01-26: 10 mL

## 2012-01-26 MED ORDER — EPHEDRINE SULFATE 50 MG/ML IJ SOLN
INTRAMUSCULAR | Status: DC | PRN
  Administered 2012-01-26: 10 mg via INTRAVENOUS
  Administered 2012-01-26: 15 mg via INTRAVENOUS

## 2012-01-26 SURGICAL SUPPLY — 71 items
ADH SKN CLS APL DERMABOND .7 (GAUZE/BANDAGES/DRESSINGS)
APL SKNCLS STERI-STRIP NONHPOA (GAUZE/BANDAGES/DRESSINGS) ×1
APPLIER CLIP 9.375 MED OPEN (MISCELLANEOUS) ×2
APR CLP MED 9.3 20 MLT OPN (MISCELLANEOUS) ×1
BENZOIN TINCTURE PRP APPL 2/3 (GAUZE/BANDAGES/DRESSINGS) ×2 IMPLANT
BINDER BREAST LRG (GAUZE/BANDAGES/DRESSINGS) ×1 IMPLANT
BINDER BREAST MEDIUM (GAUZE/BANDAGES/DRESSINGS) IMPLANT
BINDER BREAST XLRG (GAUZE/BANDAGES/DRESSINGS) IMPLANT
BINDER BREAST XXLRG (GAUZE/BANDAGES/DRESSINGS) IMPLANT
BLADE SURG 15 STRL LF DISP TIS (BLADE) ×1 IMPLANT
BLADE SURG 15 STRL SS (BLADE) ×2
BNDG COHESIVE 4X5 TAN STRL (GAUZE/BANDAGES/DRESSINGS) IMPLANT
CANISTER SUCTION 1200CC (MISCELLANEOUS) ×2 IMPLANT
CHLORAPREP W/TINT 26ML (MISCELLANEOUS) ×2 IMPLANT
CLIP APPLIE 9.375 MED OPEN (MISCELLANEOUS) ×1 IMPLANT
CLOTH BEACON ORANGE TIMEOUT ST (SAFETY) ×2 IMPLANT
COVER MAYO STAND STRL (DRAPES) ×2 IMPLANT
COVER PROBE W GEL 5X96 (DRAPES) ×2 IMPLANT
COVER TABLE BACK 60X90 (DRAPES) ×2 IMPLANT
DECANTER SPIKE VIAL GLASS SM (MISCELLANEOUS) IMPLANT
DERMABOND ADVANCED (GAUZE/BANDAGES/DRESSINGS)
DERMABOND ADVANCED .7 DNX12 (GAUZE/BANDAGES/DRESSINGS) IMPLANT
DEVICE DUBIN W/COMP PLATE 8390 (MISCELLANEOUS) ×1 IMPLANT
DRAIN CHANNEL 19F RND (DRAIN) IMPLANT
DRAPE LAPAROSCOPIC ABDOMINAL (DRAPES) ×1 IMPLANT
DRAPE U-SHAPE 76X120 STRL (DRAPES) IMPLANT
DRSG TEGADERM 4X4.75 (GAUZE/BANDAGES/DRESSINGS) ×4 IMPLANT
ELECT COATED BLADE 2.86 ST (ELECTRODE) ×2 IMPLANT
ELECT REM PT RETURN 9FT ADLT (ELECTROSURGICAL) ×2
ELECTRODE REM PT RTRN 9FT ADLT (ELECTROSURGICAL) ×1 IMPLANT
EVACUATOR SILICONE 100CC (DRAIN) IMPLANT
GAUZE SPONGE 4X4 12PLY STRL LF (GAUZE/BANDAGES/DRESSINGS) ×1 IMPLANT
GLOVE BIO SURGEON STRL SZ 6.5 (GLOVE) ×2 IMPLANT
GLOVE BIO SURGEON STRL SZ7 (GLOVE) ×2 IMPLANT
GLOVE BIOGEL PI IND STRL 7.0 (GLOVE) IMPLANT
GLOVE BIOGEL PI IND STRL 7.5 (GLOVE) ×1 IMPLANT
GLOVE BIOGEL PI INDICATOR 7.0 (GLOVE) ×2
GLOVE BIOGEL PI INDICATOR 7.5 (GLOVE) ×1
GOWN PREVENTION PLUS XLARGE (GOWN DISPOSABLE) ×3 IMPLANT
GOWN STRL REIN 2XL XLG LVL4 (GOWN DISPOSABLE) ×1 IMPLANT
GOWN STRL REIN XL XLG (GOWN DISPOSABLE) ×1 IMPLANT
KIT MARKER MARGIN INK (KITS) ×2 IMPLANT
NDL HYPO 25X1 1.5 SAFETY (NEEDLE) ×2 IMPLANT
NDL SAFETY ECLIPSE 18X1.5 (NEEDLE) ×1 IMPLANT
NEEDLE HYPO 18GX1.5 SHARP (NEEDLE)
NEEDLE HYPO 25X1 1.5 SAFETY (NEEDLE) ×4 IMPLANT
NS IRRIG 1000ML POUR BTL (IV SOLUTION) ×1 IMPLANT
PACK BASIN DAY SURGERY FS (CUSTOM PROCEDURE TRAY) ×2 IMPLANT
PENCIL BUTTON HOLSTER BLD 10FT (ELECTRODE) ×2 IMPLANT
PIN SAFETY STERILE (MISCELLANEOUS) ×1 IMPLANT
SLEEVE SCD COMPRESS KNEE MED (MISCELLANEOUS) ×2 IMPLANT
SPONGE LAP 18X18 X RAY DECT (DISPOSABLE) IMPLANT
SPONGE LAP 4X18 X RAY DECT (DISPOSABLE) ×2 IMPLANT
STAPLER VISISTAT 35W (STAPLE) ×2 IMPLANT
STOCKINETTE IMPERVIOUS LG (DRAPES) IMPLANT
STRIP CLOSURE SKIN 1/2X4 (GAUZE/BANDAGES/DRESSINGS) ×2 IMPLANT
SUT MNCRL AB 4-0 PS2 18 (SUTURE) ×2 IMPLANT
SUT MON AB 5-0 PS2 18 (SUTURE) IMPLANT
SUT SILK 2 0 SH (SUTURE) IMPLANT
SUT VIC AB 2-0 SH 27 (SUTURE) ×2
SUT VIC AB 2-0 SH 27XBRD (SUTURE) ×1 IMPLANT
SUT VIC AB 3-0 SH 27 (SUTURE) ×2
SUT VIC AB 3-0 SH 27X BRD (SUTURE) ×1 IMPLANT
SUT VIC AB 5-0 PS2 18 (SUTURE) IMPLANT
SUT VICRYL AB 3 0 TIES (SUTURE) IMPLANT
SYR CONTROL 10ML LL (SYRINGE) ×3 IMPLANT
TOWEL OR 17X24 6PK STRL BLUE (TOWEL DISPOSABLE) ×2 IMPLANT
TOWEL OR NON WOVEN STRL DISP B (DISPOSABLE) ×2 IMPLANT
TUBE CONNECTING 20X1/4 (TUBING) ×2 IMPLANT
WATER STERILE IRR 1000ML POUR (IV SOLUTION) ×2 IMPLANT
YANKAUER SUCT BULB TIP NO VENT (SUCTIONS) ×2 IMPLANT

## 2012-01-26 NOTE — Anesthesia Procedure Notes (Signed)
Procedure Name: LMA Insertion Date/Time: 01/26/2012 11:10 AM Performed by: Verlan Friends Pre-anesthesia Checklist: Patient identified, Emergency Drugs available, Suction available, Patient being monitored and Timeout performed Patient Re-evaluated:Patient Re-evaluated prior to inductionOxygen Delivery Method: Circle System Utilized Preoxygenation: Pre-oxygenation with 100% oxygen Intubation Type: IV induction Ventilation: Mask ventilation without difficulty LMA: LMA inserted LMA Size: 4.0 Number of attempts: 1 Airway Equipment and Method: bite block (soft gauze bite gaurd used) Placement Confirmation: positive ETCO2 Tube secured with: Tape Dental Injury: Teeth and Oropharynx as per pre-operative assessment  Comments: Patient has maxillary round house crowns anteriorly. They are is good condition.

## 2012-01-26 NOTE — Op Note (Signed)
Preoperative diagnosis: Clinical stage I right breast cancer Postoperative diagnosis: SAA Procedure:  1.  Right breast wire guided lumpectomy 2. Right axillary sentinel node biopsy Surgeon: Dr. Harden Mo Anesthesia: General with LMA Specimen: 1. Right breast tissue marked with paint 2. Right axillary sentinel nodes times three, counts between 189-597 Drains: none Complications: none EBL: minimal Sponge and needle count correct times two at end of operation Disposition to recovery in stable condition  Indications: This is a 31 yof with newly diagnosed clinical stage I right breast cancer.  We discussed her options and decided on a lumpectomy with sentinel node biopsy.  Procedure: After informed consent was obtained she first was taken to the breast center where she had a wire placed.  I had these images available for my review.  She was injected with technetium in the standard periareolar fashion. She was then taken to the operating room placed under general anesthesia. She was administered 1 g of vancomycin due to her penicillin allergy. Sequential compression devices were placed on her legs. Her right breast and axilla were then prepped and draped in the standard sterile surgical fashion. A surgical timeout was performed.  I made a curvilinear incision overlying the lesion as well as the wire in the right breast. I used cautery to dissect down into the breast tissue. I brought the wire ran from remotely. I then excised the wire and the surrounding tissue in its entirety all the way down to and including the pectoralis fashion.  I excised to try to ensure negative margins.This was then taken off the table and marked with pain. A mammogram was taken confirming removal of the mass and the clip and the wire. The mass and the clip were in the middle of the specimen. I then obtained hemostasis. I placed 2 clips deep. I placed one clip in each of the cardinal decisions around the cavity. I then  closed this with 2-0 Vicryl, 3-0 Vicryl, 4 Monocryl. I infiltrated quarter percent Marcaine throughout the wound.  I then made a 2 cm incision below the axillary hairline. I carried this through the axillary fascia. I was able to identify 3 small packets of sentinel nodes with the counts as listed as above. There was essentially no background radioactivity. There were a couple of small bleeding vessels which I used clips on. Hemostasis was observed at completion. I closed the axillary fascia 2-0 Vicryl. I think was the skin with 3-0 Vicryl, 4-0 Monocryl, and Steri-Strips. I put Marcaine in this wound as well. I then placed gauze and dressings over both of these incisions. A breast binder was placed. She tolerated this well was extubated and transferred to recovery stable.

## 2012-01-26 NOTE — Interval H&P Note (Signed)
History and Physical Interval Note:  01/26/2012 10:41 AM  Katie Zimmerman  has presented today for surgery, with the diagnosis of right breast cancer  The various methods of treatment have been discussed with the patient and family. After consideration of risks, benefits and other options for treatment, the patient has consented to  Procedure(s) (LRB): BREAST LUMPECTOMY WITH NEEDLE LOCALIZATION AND AXILLARY SENTINEL LYMPH NODE BX (Right) as a surgical intervention .  The patient's history has been reviewed, patient examined, no change in status, stable for surgery.  I have reviewed the patient's chart and labs.  Questions were answered to the patient's satisfaction.     Blue Ruggerio

## 2012-01-26 NOTE — Progress Notes (Signed)
Unable to remove wedding band and engagement ring. Dr. Justin Mend talked with pt, and husband about cutting jewelry off and both agreed to have the rings cut off. Rings and glasses given to husband prior to patient going back to surgery.

## 2012-01-26 NOTE — Anesthesia Preprocedure Evaluation (Signed)
Anesthesia Evaluation  Patient identified by MRN, date of birth, ID band Patient awake    Reviewed: Allergy & Precautions, H&P , NPO status , Patient's Chart, lab work & pertinent test results, reviewed documented beta blocker date and time   History of Anesthesia Complications (+) PROLONGED EMERGENCE  Airway Mallampati: II TM Distance: >3 FB Neck ROM: full    Dental   Pulmonary neg pulmonary ROS,  breath sounds clear to auscultation        Cardiovascular negative cardio ROS  Rhythm:regular     Neuro/Psych negative neurological ROS  negative psych ROS   GI/Hepatic Neg liver ROS, GERD-  Medicated and Controlled,  Endo/Other  negative endocrine ROS  Renal/GU Renal disease  negative genitourinary   Musculoskeletal   Abdominal   Peds  Hematology negative hematology ROS (+)   Anesthesia Other Findings See surgeon's H&P   Reproductive/Obstetrics negative OB ROS                           Anesthesia Physical Anesthesia Plan  ASA: II  Anesthesia Plan: General   Post-op Pain Management:    Induction: Intravenous  Airway Management Planned: LMA  Additional Equipment:   Intra-op Plan:   Post-operative Plan: Extubation in OR  Informed Consent: I have reviewed the patients History and Physical, chart, labs and discussed the procedure including the risks, benefits and alternatives for the proposed anesthesia with the patient or authorized representative who has indicated his/her understanding and acceptance.   Dental Advisory Given  Plan Discussed with: CRNA and Surgeon  Anesthesia Plan Comments:         Anesthesia Quick Evaluation

## 2012-01-26 NOTE — H&P (View-Only) (Signed)
Patient ID: Katie Zimmerman, female   DOB: 1945-11-24, 66 y.o.   MRN: [id number]  Chief Complaint  Patient presents with  . Breast Cancer    HPI Katie Zimmerman is a 66 y.o. female.  Referred by Dr. Cain Zimmerman HPI This is a 66 year old female who presents after undergoing a screening mammogram with a right-sided abnormality. She also left-sided abnormality that was a cyst that was aspirated. She has no prior history of any breast complaints at all. The mass on ultrasound was 9 x 8 x 5 mm. Since then she has also undergone an MRI which shows no other abnormalities it shows a 2 x 1.6 x 1 cm area of abnormality. This is 100% estrogen and progesterone receptor positive. 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 The carcinoma. Low grade. The specimen is very fragmented but there is no definite invasive disease although there is some concern. She comes in today to discuss all of her options for treatment of this to be diagnosed breast cancer. She has no complaints referable to her breasts. She does have a family history in 2 cousins and an aunt with breast cancer. No one has ever had any genetic testing. Past Medical History  Diagnosis Date  . GERD (gastroesophageal reflux disease)   . Osteoporosis   . Nerve damage 2000    Right side of face after Shingles  . Cancer     breast    Past Surgical History  Procedure Date  . Ovarian cyst surgery 1964  . Tubal ligation 1976  . Foot surgery 1996  . Glaucoma surgery 1993    Family History  Problem Relation Age of Onset  . Hypertension Mother   . Cancer Mother     LIVER cancer  . Hypertension Father   . Diabetes Father   . Heart failure Father     Social History History  Substance Use Topics  . Smoking status: Never Smoker   . Smokeless tobacco: Never Used  . Alcohol Use: Yes     occasional glass of wine    Allergies  Allergen Reactions  . Penicillins     REACTION: throat swells and rash  . Sulfonamide Derivatives    REACTION: rash    Current Outpatient Prescriptions  Medication Sig Dispense Refill  . amitriptyline (ELAVIL) 25 MG tablet Increase to 2 tabs daily, may increase further to 75-100 mg daily if no side effects but not reached maximal benefit.  60 tablet  5  . Calcium Carbonate-Vit D-Min (CALTRATE 600+D PLUS MINERALS) 600-800 MG-UNIT TABS Take 2 tablets by mouth 2 (two) times daily.      Marland Kitchen gabapentin (NEURONTIN) 300 MG capsule Take 2 capsules by mouth three times a day  180 capsule  3  . MOVIPREP 100 G SOLR MOVI PREP take as directed no substitution  1 kit  0    Review of Systems Review of Systems  Constitutional: Negative for fever, chills and unexpected weight change.  HENT: Negative for hearing loss, congestion, sore throat, trouble swallowing and voice change.   Eyes: Negative for visual disturbance.  Respiratory: Negative for cough and wheezing.   Cardiovascular: Negative for chest pain, palpitations and leg swelling.  Gastrointestinal: Negative for nausea, vomiting, abdominal pain, diarrhea, constipation, blood in stool, abdominal distention and anal bleeding.  Genitourinary: Negative for hematuria, vaginal bleeding and difficulty urinating.  Musculoskeletal: Negative for arthralgias.  Skin: Negative for rash and wound.  Neurological: Negative for seizures, syncope and headaches.  Hematological: Negative for  adenopathy. Does not bruise/bleed easily.  Psychiatric/Behavioral: Negative for confusion.    Blood pressure 138/80, pulse 72, temperature 97.4 F (36.3 C), temperature source Temporal, resp. rate 16, height 4\' 11"  (1.499 m), weight 162 lb 6 oz (73.653 kg).  Physical Exam Physical Exam  Vitals reviewed. Constitutional: She appears well-developed and well-nourished.  Neck: Neck supple.  Cardiovascular: Normal rate, regular rhythm and normal heart sounds.   Pulmonary/Chest: Effort normal and breath sounds normal. She has no wheezes. She has no rales. Right breast exhibits no  inverted nipple, no mass, no nipple discharge, no skin change and no tenderness. Left breast exhibits no inverted nipple, no mass, no nipple discharge, no skin change and no tenderness. Breasts are symmetrical.  Lymphadenopathy:    She has no cervical adenopathy.    Data Reviewed BILATERAL BREAST MRI WITH AND WITHOUT CONTRAST  Technique: Multiplanar, multisequence MR images of both breasts  were obtained prior to and following the intravenous administration  of 15ml of Multihance. Three dimensional images were evaluated at  the independent DynaCad workstation.  Comparison: [facility] 01/10/2012 and earlier  Findings: Background parenchymal enhancement is moderate. Within  the lower outer quadrant of the right breast, there is an area of  non mass persistent type enhancement kinetics. This measures 2.0 x  1.6 x 1.0 cm and correlates with clip artifact following recent  ultrasound guided core biopsy which showed breast malignancy. No  other areas of concern identified in the right breast. Images of  the left breast are unremarkable.  IMPRESSION:  Solitary area of abnormal enhancement, 2.0 cm within the lower  outer quadrant of the right breast, consistent with known  malignancy.   Assessment    Right breast cancer    Plan    Right breast wire guided lumpectomy, right axillary sentinel node biopsy She has ductal carcinoma on her path.  In discussion with pathology there is no definite invasion.  It is difficult to tell due to the fragmentation though. There is some concern that there is an invasive nature to this tumor. I had a long discussion with her today about whether or not to proceed with a sentinel lymph node. I think we can go either way. After a long conversation about the risks and indications for said that lymph node biopsy she would like to proceed with this at the same time. We also discussed possible genetics  referral at some point  We discussed the staging and pathophysiology of breast cancer. We discussed all of the different options for treatment for breast cancer including surgery, chemotherapy, radiation therapy, Herceptin, and antiestrogen therapy.   We discussed a sentinel lymph node biopsy as she does not appear to having lymph node involvement right now. We discussed the performance of that with injection of radioactive tracer and blue dye. We discussed that she would have an incision underneath her axillary hairline. We discussed that there is a bout a 10-20% chance of having a positive node with a sentinel lymph node biopsy and we will await the permanent pathology to make any other first further decisions in terms of her treatment. One of these options might be to return to the operating room to perform an axillary lymph node dissection. We discussed about a 1-2% risk lifetime of chronic shoulder pain as well as lymphedema associated with a sentinel lymph node biopsy.  We discussed the options for treatment of the breast cancer which included lumpectomy versus a mastectomy. We  discussed the performance of the lumpectomy with a wire placement. We discussed a 10-20% chance of a positive margin requiring reexcision in the operating room. We also discussed that she may need radiation therapy or antiestrogen therapy or both if she undergoes lumpectomy. We discussed the mastectomy and the postoperative care for that as well. We discussed that there is no difference in her survival whether she undergoes lumpectomy with radiation therapy or antiestrogen therapy versus a mastectomy. There is a slight difference in the local recurrence rate being 3-5% with lumpectomy and about 1% with a mastectomy. We discussed the risks of operation including bleeding, infection, possible reoperation. She understands her further therapy will be based on what her stages at the time of her operation.         Katie Zimmerman 01/20/2012, 8:37 AM

## 2012-01-26 NOTE — Anesthesia Postprocedure Evaluation (Signed)
Anesthesia Post Note  Patient: Katie Zimmerman  Procedure(s) Performed: Procedure(s) (LRB): BREAST LUMPECTOMY WITH NEEDLE LOCALIZATION AND AXILLARY SENTINEL LYMPH NODE BX (Right)  Anesthesia type: General  Patient location: PACU  Post pain: Pain level controlled  Post assessment: Patient's Cardiovascular Status Stable  Last Vitals:  Filed Vitals:   01/26/12 1407  BP: 129/70  Pulse: 80  Temp: 36.5 C  Resp: 16    Post vital signs: Reviewed and stable  Level of consciousness: alert  Complications: No apparent anesthesia complications

## 2012-01-26 NOTE — Transfer of Care (Signed)
Immediate Anesthesia Transfer of Care Note  Patient: Katie Zimmerman  Procedure(s) Performed: Procedure(s) (LRB): BREAST LUMPECTOMY WITH NEEDLE LOCALIZATION AND AXILLARY SENTINEL LYMPH NODE BX (Right)  Patient Location: PACU  Anesthesia Type: General  Level of Consciousness: sedated and unresponsive  Airway & Oxygen Therapy: Patient Spontanous Breathing and Patient connected to face mask oxygen  Post-op Assessment: Report given to PACU RN and Post -op Vital signs reviewed and stable  Post vital signs: Reviewed and stable  Complications: No apparent anesthesia complications

## 2012-02-01 ENCOUNTER — Telehealth (INDEPENDENT_AMBULATORY_CARE_PROVIDER_SITE_OTHER): Payer: Self-pay | Admitting: General Surgery

## 2012-02-01 NOTE — Telephone Encounter (Signed)
Patient called in requesting pathology results. Advised the patient that as of right now the results have not been received. Advised that she has to consider the holiday weekend and that pathology may be backed up in processing results. Advised information would be forwarded to [facility]. Patient agreed.

## 2012-02-01 NOTE — Telephone Encounter (Signed)
Patient called in stating that her right arm near surgery site is swollen. Patient advised that swelling is not unusual for the procedure performed. Tried to obtain level of swelling from the patient and she said it was slightly swollen. Area is not double the size of her left upper arm. Patient stated no fever, no sign of infection and no tingling of her right hand or arm. Advised information would be forwarded to Dr. Dwain Sarna and Elease Hashimoto. Also advised that based on her call earlier this morning that I looked at the chart and the pathology had not been received yet.

## 2012-02-02 ENCOUNTER — Telehealth (INDEPENDENT_AMBULATORY_CARE_PROVIDER_SITE_OTHER): Payer: Self-pay

## 2012-02-02 ENCOUNTER — Other Ambulatory Visit (INDEPENDENT_AMBULATORY_CARE_PROVIDER_SITE_OTHER): Payer: Self-pay | Admitting: General Surgery

## 2012-02-02 DIAGNOSIS — D0510 Intraductal carcinoma in situ of unspecified breast: Secondary | ICD-10-CM

## 2012-02-02 NOTE — Telephone Encounter (Signed)
I called the pt to check on her after the message left yesterday about some swelling at her surgery site from 8/29 on her breast. The pt is doing better today the swelling has gone down some after using ice and heat. I advised the pt that her path is still not back that I was going to call pathology to see about when they think it will be signed out. We will be back in touch with the pt. As soon as the path is ready.  I called pathology to check on path and Angelica Chessman said that Dr Colonel Bald was notified about the delay of the path. Dr Colonel Bald to call Dr Dwain Sarna about the path.

## 2012-02-02 NOTE — Telephone Encounter (Signed)
Her swelling and pain is improving today. I called and discussed her path today with her. I asked her to call if she worsens or does not continue to improve.  Will refer her to see med onc and xrt

## 2012-02-06 ENCOUNTER — Telehealth: Payer: Self-pay | Admitting: *Deleted

## 2012-02-06 NOTE — Telephone Encounter (Signed)
Left message for pt to return my call so I can schedule a Med Onc appt. 

## 2012-02-06 NOTE — Telephone Encounter (Signed)
Patient returned my call and I confirmed 02/07/12 appt w/ pt.  Unable to mail before appt letter & packet - gave verbal.  Milagros Loll at [facility] to make aware.  Took paperwork to Med [facility] for chart.

## 2012-02-07 ENCOUNTER — Other Ambulatory Visit (HOSPITAL_BASED_OUTPATIENT_CLINIC_OR_DEPARTMENT_OTHER): Payer: Medicare Other | Admitting: Lab

## 2012-02-07 ENCOUNTER — Other Ambulatory Visit: Payer: Self-pay | Admitting: Medical Oncology

## 2012-02-07 ENCOUNTER — Encounter: Payer: Self-pay | Admitting: *Deleted

## 2012-02-07 ENCOUNTER — Ambulatory Visit (HOSPITAL_BASED_OUTPATIENT_CLINIC_OR_DEPARTMENT_OTHER): Payer: Medicare Other | Admitting: Oncology

## 2012-02-07 ENCOUNTER — Encounter: Payer: Self-pay | Admitting: Oncology

## 2012-02-07 ENCOUNTER — Ambulatory Visit: Payer: Medicare Other

## 2012-02-07 ENCOUNTER — Encounter: Payer: Medicare Other | Admitting: Internal Medicine

## 2012-02-07 VITALS — BP 156/87 | HR 92 | Temp 98.9°F | Resp 20 | Ht 59.0 in | Wt 163.3 lb

## 2012-02-07 DIAGNOSIS — D0510 Intraductal carcinoma in situ of unspecified breast: Secondary | ICD-10-CM

## 2012-02-07 DIAGNOSIS — D0590 Unspecified type of carcinoma in situ of unspecified breast: Secondary | ICD-10-CM

## 2012-02-07 DIAGNOSIS — C50919 Malignant neoplasm of unspecified site of unspecified female breast: Secondary | ICD-10-CM

## 2012-02-07 DIAGNOSIS — Z170 Estrogen receptor positive status [ER+]: Secondary | ICD-10-CM

## 2012-02-07 LAB — CBC WITH DIFFERENTIAL/PLATELET
BASO%: 0.9 % (ref 0.0–2.0)
Basophils Absolute: 0.1 10*3/uL (ref 0.0–0.1)
EOS%: 3.1 % (ref 0.0–7.0)
Eosinophils Absolute: 0.3 10*3/uL (ref 0.0–0.5)
HCT: 41.3 % (ref 34.8–46.6)
HGB: 13.6 g/dL (ref 11.6–15.9)
LYMPH%: 26.6 % (ref 14.0–49.7)
MCH: 29.4 pg (ref 25.1–34.0)
MCHC: 32.9 g/dL (ref 31.5–36.0)
MCV: 89.3 fL (ref 79.5–101.0)
MONO#: 0.7 10*3/uL (ref 0.1–0.9)
MONO%: 7.5 % (ref 0.0–14.0)
NEUT#: 6.1 10*3/uL (ref 1.5–6.5)
NEUT%: 61.9 % (ref 38.4–76.8)
Platelets: 322 10*3/uL (ref 145–400)
RBC: 4.62 10*6/uL (ref 3.70–5.45)
RDW: 13 % (ref 11.2–14.5)
WBC: 9.9 10*3/uL (ref 3.9–10.3)
lymph#: 2.6 10*3/uL (ref 0.9–3.3)

## 2012-02-07 LAB — COMPREHENSIVE METABOLIC PANEL (CC13)
ALT: 13 U/L (ref 0–55)
AST: 13 U/L (ref 5–34)
Albumin: 3.8 g/dL (ref 3.5–5.0)
Alkaline Phosphatase: 87 U/L (ref 40–150)
BUN: 15 mg/dL (ref 7.0–26.0)
CO2: 25 mEq/L (ref 22–29)
Calcium: 10.3 mg/dL (ref 8.4–10.4)
Chloride: 106 mEq/L (ref 98–107)
Creatinine: 1 mg/dL (ref 0.6–1.1)
Glucose: 86 mg/dl (ref 70–99)
Potassium: 4.1 mEq/L (ref 3.5–5.1)
Sodium: 141 mEq/L (ref 136–145)
Total Bilirubin: 0.4 mg/dL (ref 0.20–1.20)
Total Protein: 7 g/dL (ref 6.4–8.3)

## 2012-02-07 NOTE — Progress Notes (Signed)
Katie Zimmerman [id number] [birth year] 66 y.o. 02/07/2012 4:20 PM  CC  [name], MD [address] [place] [address] Dr. Emelia Loron Dr. Antony Blackbird  REASON FOR CONSULTATION:  66 year old female with new diagnosis of ductal carcinoma in situ of the right breast. Patient is seen for discussion of her treatment options.  STAGE:   Right Breast DCIS Stage 0  REFERRING PHYSICIAN: Dr. Emelia Loron  HISTORY OF PRESENT ILLNESS:  Katie Zimmerman is a 66 y.o. female.  Underwent a screening mammogram with a right-sided abnormality. She also had a left-sided abnormality, was a cyst.Right eye patient had an ultrasound of the mass that measured 9 x 8 x 5 mm. She then had an MRI that showed no other abnormalities except for a 2 x 1.6 x 1 cm area of abnormality. Patient had a core needle biopsy performed that was positive for a DCIS. The tumor was estrogen receptor +100% progesterone receptor positive. It was low grade. She has gone on to be seen by Dr. Emelia Loron who did a breast lumpectomy with needle localization and axillary sentinel lymph node biopsy on 01/26/2012. The final pathology did reveal a 1.0 cm low grade ductal carcinoma in situ with lobular neoplasia all surgical margins were negative for ductal carcinoma 3 sentinel nodes were negative for metastatic disease. The tumor was estrogen receptor +100% progesterone receptor +100%. Patient did quite well postoperatively. She today is without any complaints. Recommendations for her treatment are based on NCCN guidelines for DCIS stage 0.Her case has been discussed at the multidisciplinary breast conference.   Past Medical History: Past Medical History  Diagnosis Date  . GERD (gastroesophageal reflux disease)   . Osteoporosis   . Nerve damage 2000    Right side of face after Shingles  . Cancer     breast  . Complication of anesthesia     hard to wake up age 67-not since    Past Surgical  History: Past Surgical History  Procedure Date  . Ovarian cyst surgery 1964  . Tubal ligation 1976  . Foot surgery 1996    rt/lt great toes  . Glaucoma surgery 1993    laser  . Appendectomy   . Eye surgery     Family History: maternal aunt breast cancer, 3 maternal cousins with breast cancer, 1 paternal cousin with breast cancer, no ovarian, no colon cancers, mother with liver cancer at 95 Family History  Problem Relation Age of Onset  . Hypertension Mother   . Cancer Mother     LIVER cancer  . Hypertension Father   . Diabetes Father   . Heart failure Father     Social History; Married with 2 children ( daughter 32 and 26), sells [employer] History  Substance Use Topics  . Smoking status: Never Smoker   . Smokeless tobacco: Never Used  . Alcohol Use: Yes     occasional glass of wine    Allergies: Allergies  Allergen Reactions  . Penicillins     REACTION: throat swells and rash  . Sulfonamide Derivatives     REACTION: rash    Current Medications: Current Outpatient Prescriptions  Medication Sig Dispense Refill  . amitriptyline (ELAVIL) 25 MG tablet Increase to 2 tabs daily, may increase further to 75-100 mg daily if no side effects but not reached maximal benefit.  60 tablet  5  . Calcium Carbonate-Vit D-Min (CALTRATE 600+D PLUS MINERALS) 600-800 MG-UNIT TABS Take 2 tablets by  mouth 2 (two) times daily.      Marland Kitchen gabapentin (NEURONTIN) 300 MG capsule Take 2 capsules by mouth three times a day  180 capsule  3    OB/GYN History: menarche at 66 years of age, menopause at 70, HRT for a few years, stopped 10 - 15 years ago, first pregnancy at 18  Fertility Discussion: N/A Prior History of Cancer: none  Health Maintenance:  Colonoscopy none Bone Density yes Last PAP smear 2013  ECOG PERFORMANCE STATUS: 0 - Asymptomatic  Genetic Counseling/testing: due to family history will refer to genetic counseling and possibly testing  REVIEW OF SYSTEMS: Overall patient is  doing well she is in no acute distress well-developed well-nourished female she denies any fevers chills night sweats headaches shortness of breath double vision blurring of vision no difficulty in swallowing no shortness of breath no chest pains no palpitations no myalgias or arthralgias. She has no cough hemoptysis hematemesis no hematuria hematochezia melena she denies abdominal pain she has no swelling in her legs. She denies having any difficulty in ambulation no tingling numbness. Remainder of the 14 point review of systems is negative.   PHYSICAL EXAMINATION: Blood pressure 156/87, pulse 92, temperature 98.9 F (37.2 C), temperature source Oral, resp. rate 20, height 4\' 11"  (1.499 m), weight 163 lb 4.8 oz (74.072 kg). Generally well developed and her female. HEENT exam EOMI PERRLA sclerae anicteric no conjunctival pallor oral mucosa is moist neck is supple lungs are clear bilaterally to auscultation and percussion prevascular is regular rate rhythm no murmurs gallops or rubs abdomen is soft nontender nondistended bowel sounds are present no hepatosplenomegaly extremities no clubbing edema or cyanosis neuro patient's alert oriented otherwise nonfocal breast examination left breast no masses nipple discharge no skin changes right breast reveals well-healed incisional scar no masses nipple discharge no tenderness.   STUDIES/RESULTS: Mr Breast Bilateral W Wo Contrast  01/17/2012  *RADIOLOGY REPORT*  Clinical Data: *RADIOLOGY REPORT*  Clinical Data: Ductal carcinoma diagnosed on ultrasound guided core biopsy of right breast mass 7 o'clock location.  The patient has family history breast cancer in her aunt and 3 first cousins.  BUN and creatinine were obtained on site at [facility] at [address] Results:  BUN 12 mg/dL,  Creatinine 0.8 mg/dL.  BILATERAL BREAST MRI WITH AND WITHOUT CONTRAST  Technique: Multiplanar, multisequence MR images of both breasts were obtained prior to and  following the intravenous administration of 15ml of Multihance.  Three dimensional images were evaluated at the independent DynaCad workstation.  Comparison:  Mammogram and ultrasound [facility] 01/10/2012 and earlier  Findings: Background parenchymal enhancement is moderate.  Within the lower outer quadrant of the right breast, there is an area of non mass persistent type enhancement kinetics.  This measures 2.0 x 1.6 x 1.0 cm and correlates with clip artifact following recent ultrasound guided core biopsy which showed breast malignancy.  No other areas of concern identified in the right breast.  Images of the left breast are unremarkable.  IMPRESSION: Solitary area of abnormal enhancement, 2.0 cm within the lower outer quadrant of the right breast, consistent with known malignancy.  RECOMMENDATION: Treatment plan.  THREE-DIMENSIONAL MR IMAGE RENDERING ON INDEPENDENT WORKSTATION:  Three-dimensional MR images were rendered by post-processing of the original MR data on an independent workstation.  The three- dimensional MR images were interpreted, and findings were reported in the accompanying complete MRI report for this study.  BI-RADS CATEGORY 6:  Known biopsy-proven malignancy -  appropriate action should be taken.   Original Report Authenticated By: Patterson Hammersmith, M.D.    Nm Sentinel Node Inj-no Rpt (breast)  01/26/2012  CLINICAL DATA: right axillary sentinel node biopsy   Sulfur colloid was injected intradermally by the nuclear medicine  technologist for breast cancer sentinel node localization.     [place] Core Biopsy  01/11/2012  *RADIOLOGY REPORT*  Clinical Data:  Oval nodule at 7 o'clock 3 cm from the right nipple  ULTRASOUND GUIDED VACUUM ASSISTED CORE BIOPSY OF THE RIGHT BREAST  The patient and I discussed the procedure of ultrasound-guided biopsy, including benefits and alternatives.  We discussed the high likelihood of a successful procedure. We discussed the risks of  the procedure including infection, bleeding, clip migration, and inadequate sampling.  Informed written consent was given.  Using sterile technique, 2% lidocaine, ultrasound guidance and a 12 gauge vacuum assisted needle, biopsy was performed of the nodule at 7 o'clock in the right breast using a caudocranial approach.  At the conclusion of the procedure, a wing tissue marker clip was deployed into the biopsy cavity.  Follow-up 2-view mammogram was performed and dictated separately.  IMPRESSION: Ultrasound-guided biopsy of a nodule at 7 o'clock 3 cm from the right nipple.  No apparent complications.  Original Report Authenticated By: Daryl Eastern, M.D.   US Aspiration  01/10/2012  *RADIOLOGY REPORT*  Clinical Data:  Oval nodule at 3 o'clock in the left breast  ULTRASOUND GUIDED ASPIRATION OF THE left BREAST  Comparison: None  Using sterile technique,  2% lidocaine, ultrasound guidance, and an 18 gauge needle, aspiration was performed of the nodule at 3 o'clock in the left breast.  Approximately 1 ml of viscous yellow fluid was withdrawn with collapse of the nodule confirming that it is a cyst.  IMPRESSION: Ultrasound-guided aspiration of a cyst at 3 o'clock in the left breast.  No apparent complications.  Original Report Authenticated By: Daryl Eastern, M.D.   [place] Wire Localization Right  01/26/2012  *RADIOLOGY REPORT*  Clinical Data:  Right-sided breast cancer  NEEDLE LOCALIZATION USING ULTRASOUND GUIDANCE AND SPECIMEN RADIOGRAPH  Comparison: Previous exams.  Patient presents for needle localization prior to right lumpectomy. I met with the patient and we discussed the procedure of needle localization including benefits and alternatives. We discussed the high likelihood of a successful procedure. We discussed the risks of the procedure, including infection, bleeding, tissue injury, and further surgery. Informed, written consent was given.  Using ultrasound guidance, sterile technique, 2% lidocaine  and a 5 cm modified Kopans needle, the mass is localized using a lateral approach.  The films are marked for Dr. Dwain Sarna.  Specimen radiograph is performed at Day Surgery, and confirms the wire, clip and mass are present in the tissue sample.  The specimen is marked for pathology.  IMPRESSION: Needle localization right breast.  No apparent complications.   Original Report Authenticated By: Hiram Gash, M.D.    Mm Breast Surgical Specimen  01/26/2012  *RADIOLOGY REPORT*  Clinical Data:  Right-sided breast cancer  NEEDLE LOCALIZATION USING ULTRASOUND GUIDANCE AND SPECIMEN RADIOGRAPH  Comparison: Previous exams.  Patient presents for needle localization prior to right lumpectomy. I met with the patient and we discussed the procedure of needle localization including benefits and alternatives. We discussed the high likelihood of a successful procedure. We discussed the risks of the procedure, including infection, bleeding, tissue injury, and further surgery. Informed, written consent was given.  Using ultrasound guidance, sterile technique, 2% lidocaine and a 5  cm modified Kopans needle, the mass is localized using a lateral approach.  The films are marked for Dr. Dwain Sarna.  Specimen radiograph is performed at Day Surgery, and confirms the wire, clip and mass are present in the tissue sample.  The specimen is marked for pathology.  IMPRESSION: Needle localization right breast.  No apparent complications.   Original Report Authenticated By: Hiram Gash, M.D.    Mm Digital Diagnostic Unilat R  01/10/2012  *RADIOLOGY REPORT*  Clinical Data:  Ultrasound-guided core needle biopsy of an oval nodule at 7 o'clock 3 cm from the right nipple with clip placement  DIGITAL DIAGNOSTIC RIGHT MAMMOGRAM  Comparison:  None  Findings:  Films are performed following ultrasound guided biopsy of an oval nodule at 7 o'clock 3 cm from the right nipple.  The wing clip can only be seen in the MLO projection but is within  the area of concern.  IMPRESSION: Appropriate clip placement following ultrasound-guided core needle biopsy of an oval nodule at 7 o'clock 3 cm from the right nipple.  Original Report Authenticated By: Daryl Eastern, M.D.   Mm Radiologist Eval And Mgmt  01/11/2012  *RADIOLOGY REPORT*  ESTABLISHED PATIENT OFFICE VISIT - LEVEL II [phone])  Chief Complaint:  66 year old female comes in for results of her ultrasound guided right breast biopsy and to check her biopsy site. The patient has no complaints with her biopsy site.  History:  66 year old female with indeterminate 9 x 8 x 5 mm mass in the outer lower right breast - ultrasound guided right breast biopsy performed on 01/10/2012.  Also left breast aspiration performed on 01/10/2012.  Exam:  The right breast biopsy site is clean and dry without infection or hematoma. The left breast aspiration site is clean and dry with minimal erythema.  Pathology: DUCTAL CARCINOMA without evidence of invasion  Assessment and Plan:  Recommend surgery - oncology consultation. An appointment with Dr. Dwain Sarna [facility]) has been scheduled for 01/20/2012 and the patient informed. Recommend bilateral breast MRI, which has been scheduled for 01/17/2012 and the patient informed.  Original Report Authenticated By: Rosendo Gros, M.D.     LABS:    Chemistry      Component Value Date/Time   NA 141 02/07/2012 1524   NA 139 01/25/2012 1230   K 4.1 02/07/2012 1524   K 4.1 01/25/2012 1230   CL 106 02/07/2012 1524   CL 104 01/25/2012 1230   CO2 25 02/07/2012 1524   CO2 24 01/25/2012 1230   BUN 15.0 02/07/2012 1524   BUN 14 01/25/2012 1230   CREATININE 1.0 02/07/2012 1524   CREATININE 0.84 01/25/2012 1230      Component Value Date/Time   CALCIUM 10.3 02/07/2012 1524   CALCIUM 10.0 01/25/2012 1230   ALKPHOS 87 02/07/2012 1524   ALKPHOS 82 10/27/2011 1025   AST 13 02/07/2012 1524   AST 15 10/27/2011 1025   ALT 13 02/07/2012 1524   ALT 13 10/27/2011 1025   BILITOT  0.40 02/07/2012 1524   BILITOT 0.6 10/27/2011 1025      Lab Results  Component Value Date   WBC 9.9 02/07/2012   HGB 13.6 02/07/2012   HCT 41.3 02/07/2012   MCV 89.3 02/07/2012   PLT 322 02/07/2012    PATHOLOGY:  ASSESSMENT    66 year old female with new diagnosis of low-grade ductal carcinoma in situ of the right breast. Patient is now status post lumpectomy with sentinel lymph node biopsy. The final pathology showed a 1.0  cm DCIS that was low grade strongly estrogen receptor positive and progesterone receptor positive. Postoperatively she is doing well and is without any complaints. She is seen now in medical oncology for discussion of her treatment options. She and I discussed her pathology and treatment options. Would certainly does include postlumpectomy radiation therapy. When she is completed.then she would go on to chemoprevention with tamoxifen 20 mg daily. We discussed the risks and benefits of this as well. She understands that this would be a 5 year therapy.Overall patient's prognosis is excellent. We also discussed NSABP  B-43 clinical trial for testing her tumor for the HER-2/neu receptor and the possibility of her going on Herceptin prior to radiation.She did agree to this and did sign consent form today.    PLAN:    #1 patient will be referred to radiation oncology for post lumpectomy radiation.  #2 she has agreed to enroll on NSABP B. 43 clinical study and we will send her tissue for HER-2 testing. She did meet with one of the research nurses.  #3 I will plan on seeing her back after her radiation. She knows to call me with any problems questions or concerns.     Thank you so much for allowing me to participate in the care of ISSIS LINDSETH. I will continue to follow up the patient with you and assist in her care.  All questions were answered. The patient knows to call the clinic with any problems, questions or concerns. We can certainly see the patient much sooner if  necessary.  I spent 60 minutes counseling the patient face to face. The total time spent in the appointment was 60 minutes.   Drue Second, MD Medical/Oncology [facility] [phone] (beeper) [phone] (Office)  02/07/2012, 4:20 PM

## 2012-02-07 NOTE — Patient Instructions (Addendum)
Refer to radiation Oncology Dr. Roselind Messier  Refer to Genetic counseling   I will see you back in 2 month

## 2012-02-08 ENCOUNTER — Telehealth: Payer: Self-pay | Admitting: *Deleted

## 2012-02-08 NOTE — Telephone Encounter (Signed)
Gave patient appointment with dr.kinard on 02-15-2012 at 10:30am  Gave patient appointment with dr.khan on 05-08-2012 at 9:30am  Patient is aware all the appointments

## 2012-02-15 ENCOUNTER — Ambulatory Visit
Admission: RE | Admit: 2012-02-15 | Discharge: 2012-02-15 | Disposition: A | Discharge status: Home / Self Care | Payer: Medicare Other | Source: Ambulatory Visit | Attending: Radiation Oncology | Admitting: Radiation Oncology

## 2012-02-15 ENCOUNTER — Encounter: Payer: Self-pay | Admitting: Radiation Oncology

## 2012-02-15 VITALS — BP 138/93 | HR 73 | Temp 98.3°F | Wt 164.6 lb

## 2012-02-15 DIAGNOSIS — Z510 Encounter for antineoplastic radiation therapy: Secondary | ICD-10-CM | POA: Insufficient documentation

## 2012-02-15 DIAGNOSIS — D0590 Unspecified type of carcinoma in situ of unspecified breast: Secondary | ICD-10-CM | POA: Insufficient documentation

## 2012-02-15 DIAGNOSIS — R5381 Other malaise: Secondary | ICD-10-CM | POA: Insufficient documentation

## 2012-02-15 DIAGNOSIS — D0510 Intraductal carcinoma in situ of unspecified breast: Secondary | ICD-10-CM

## 2012-02-15 DIAGNOSIS — R5383 Other fatigue: Secondary | ICD-10-CM | POA: Insufficient documentation

## 2012-02-15 DIAGNOSIS — Z79899 Other long term (current) drug therapy: Secondary | ICD-10-CM | POA: Insufficient documentation

## 2012-02-15 NOTE — Progress Notes (Signed)
Please see the Nurse Progress Note in the MD Initial Consult Encounter for this patient. 

## 2012-02-15 NOTE — Addendum Note (Signed)
Encounter addended by: Tessa Lerner, RN on: 02/15/2012  4:38 PM<BR>     Documentation filed: Charges VN

## 2012-02-15 NOTE — Progress Notes (Signed)
Radiation Oncology         [phone] ________________________________  Initial outpatient Consultation  Name: Katie Zimmerman MRN: [id number]  Date: 02/15/2012  DOB: 1946/05/23  CC:[name], MD  [name], MD   REFERRING PHYSICIAN: [name], MD  DIAGNOSIS: The encounter diagnosis was DCIS (ductal carcinoma in situ) of breast.  HISTORY OF PRESENT ILLNESS: This is a 66 year old female who presents after undergoing a screening mammogram with a right-sided abnormality. She also left-sided abnormality that was a cyst that was aspirated. She has no prior history of any breast complaints at all. The mass on ultrasound was 9 x 8 x 5 mm. Since then she has also undergone an MRI which shows no other abnormalities it shows a 2 x 1.6 x 1 cm area of abnormality. This is 100% estrogen and progesterone receptor positive. 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 The carcinoma. Low grade. The specimen is very fragmented but there is no definite invasive disease although there is some concern.  The patient was seen by Dr. Dwain Sarna and was felt to be a good candidate for this conserving surgery. Given the concern for possible invasive disease,  the patient did undergo partial mastectomy and sentinel node procedure.  Final pathology revealed a 1.0 cm low-grade invasive ductal carcinoma. The surgical margins were clear with the closest margin being 5 mm.  Tumor was estrogen and progesterone receptor positive.  3 benign sentinel lymph nodes recovered at the time of her surgery.  She has been doing well since her surgery.   The patient is now seen in radiation oncology for consultation and consideration for treatment.  PREVIOUS RADIATION THERAPY: No  PAST MEDICAL HISTORY:  has a past medical history of GERD (gastroesophageal reflux disease); Osteoporosis; Nerve damage (2000); Cancer; and Complication of anesthesia.    PAST SURGICAL HISTORY: Past Surgical History  Procedure Date  .  Ovarian cyst surgery 1964  . Tubal ligation 1976  . Foot surgery 1996    rt/lt great toes  . Glaucoma surgery 1993    laser  . Appendectomy   . Biopsy breast 01/10/12    Right breast needle cor biopsy  . Breast lumpectomy 01/26/2012    Right/ERPR+ DCIS    FAMILY HISTORY: family history includes Cancer in her mother; Diabetes in her father; Heart failure in her father; and Hypertension in her father and mother.  SOCIAL HISTORY:  reports that she has never smoked. She has never used smokeless tobacco. She reports that she drinks alcohol. She reports that she does not use illicit drugs.  ALLERGIES: Penicillins and Sulfonamide derivatives  MEDICATIONS:  Current Outpatient Prescriptions  Medication Sig Dispense Refill  . amitriptyline (ELAVIL) 25 MG tablet Increase to 2 tabs daily, may increase further to 75-100 mg daily if no side effects but not reached maximal benefit.  60 tablet  5  . Calcium Carbonate-Vit D-Min (CALTRATE 600+D PLUS MINERALS) 600-800 MG-UNIT TABS Take 2 tablets by mouth 2 (two) times daily.      Marland Kitchen gabapentin (NEURONTIN) 300 MG capsule Take 2 capsules by mouth three times a day  180 capsule  3  . oxyCODONE-acetaminophen (PERCOCET/ROXICET) 5-325 MG per tablet         REVIEW OF SYSTEMS:  A 15 point review of systems is documented in the electronic medical record. This was obtained by the nursing staff. However, I reviewed this with the patient to discuss relevant findings and make appropriate changes. She has some soreness in the breast  and is taking pain medication intermittently. Prior to diagnosis the patient denies any pain in the breast area nipple discharge or bleeding.   PHYSICAL EXAM:  weight is 164 lb 9.6 oz (74.662 kg). Her temperature is 98.3 F (36.8 C). Her blood pressure is 138/93 and her pulse is 73.  the pupils are equal round and reactive to light. The extraocular eye movements are intact. The tongue is midline. There is no secondary infection noted in the  oral cavity or posterior pharynx. Patient's teeth are in good repair. Examination of the neck and supraclavicular region reveals no evidence of adenopathy.  The lungs are clear to auscultation. The heart has regular rhythm and rate. Examination of the left breast reveals no mass or nipple discharge. Examination right breast reveals a scar in the lower outer quadrant with Steri-Strips in place. This area is healing well without signs of drainage or infection. Patient also has a small scar in the low axilla from her sentinel node procedure. The abdomen is soft and nontender with normal bowel sounds. There is no obvious hepatosplenomegaly. On neurological examination motor strength is 5 out of 5 in the proximal and distal muscle groups in the upper and lower extremities. Peripheral pulses are good. There is no cyanosis clubbing or edema in the extremities.  LABORATORY DATA:  Lab Results  Component Value Date   WBC 9.9 02/07/2012   HGB 13.6 02/07/2012   HCT 41.3 02/07/2012   MCV 89.3 02/07/2012   PLT 322 02/07/2012   Lab Results  Component Value Date   NA 141 02/07/2012   K 4.1 02/07/2012   CL 106 02/07/2012   CO2 25 02/07/2012   Lab Results  Component Value Date   ALT 13 02/07/2012   AST 13 02/07/2012   ALKPHOS 87 02/07/2012   BILITOT 0.40 02/07/2012     RADIOGRAPHY: Mr Breast Bilateral W Wo Contrast  01/17/2012  *RADIOLOGY REPORT*  Clinical Data: *RADIOLOGY REPORT*  Clinical Data: Ductal carcinoma diagnosed on ultrasound guided core biopsy of right breast mass 7 o'clock location.  The patient has family history breast cancer in her aunt and 3 first cousins.  BUN and creatinine were obtained on site at [facility] at [address] Results:  BUN 12 mg/dL,  Creatinine 0.8 mg/dL.  BILATERAL BREAST MRI WITH AND WITHOUT CONTRAST  Technique: Multiplanar, multisequence MR images of both breasts were obtained prior to and following the intravenous administration of 15ml of Multihance.  Three  dimensional images were evaluated at the independent DynaCad workstation.  Comparison:  Mammogram and ultrasound from [facility] 01/10/2012 and earlier  Findings: Background parenchymal enhancement is moderate.  Within the lower outer quadrant of the right breast, there is an area of non mass persistent type enhancement kinetics.  This measures 2.0 x 1.6 x 1.0 cm and correlates with clip artifact following recent ultrasound guided core biopsy which showed breast malignancy.  No other areas of concern identified in the right breast.  Images of the left breast are unremarkable.  IMPRESSION: Solitary area of abnormal enhancement, 2.0 cm within the lower outer quadrant of the right breast, consistent with known malignancy.  RECOMMENDATION: Treatment plan.  THREE-DIMENSIONAL MR IMAGE RENDERING ON INDEPENDENT WORKSTATION:  Three-dimensional MR images were rendered by post-processing of the original MR data on an independent workstation.  The three- dimensional MR images were interpreted, and findings were reported in the accompanying complete MRI report for this study.  BI-RADS CATEGORY 6:  Known biopsy-proven malignancy -  appropriate action should be taken.   Original Report Authenticated By: Patterson Hammersmith, M.D.    Nm Sentinel Node Inj-no Rpt (breast)  01/26/2012  CLINICAL DATA: right axillary sentinel node biopsy   Sulfur colloid was injected intradermally by the nuclear medicine  technologist for breast cancer sentinel node localization.     [place] Wire Localization Right  01/26/2012  *RADIOLOGY REPORT*  Clinical Data:  Right-sided breast cancer  NEEDLE LOCALIZATION USING ULTRASOUND GUIDANCE AND SPECIMEN RADIOGRAPH  Comparison: Previous exams.  Patient presents for needle localization prior to right lumpectomy. I met with the patient and we discussed the procedure of needle localization including benefits and alternatives. We discussed the high likelihood of a successful procedure. We  discussed the risks of the procedure, including infection, bleeding, tissue injury, and further surgery. Informed, written consent was given.  Using ultrasound guidance, sterile technique, 2% lidocaine and a 5 cm modified Kopans needle, the mass is localized using a lateral approach.  The films are marked for Dr. Dwain Sarna.  Specimen radiograph is performed at Day Surgery, and confirms the wire, clip and mass are present in the tissue sample.  The specimen is marked for pathology.  IMPRESSION: Needle localization right breast.  No apparent complications.   Original Report Authenticated By: Hiram Gash, M.D.       IMPRESSION: Intraductal carcinoma of the right breast. The patient would be an excellent candidate for breast conservation therapy with radiation therapy directed at the right breast region. I discussed the treatment course,  side effects and potential toxicities of radiation therapy in this situation with the patient and her husband. She appears to understand and wishes to proceed with planned course of treatment.  PLAN: Simulation and planning on September 24 with treatments to begin approximately 6 weeks post-op.  I spent 60 minutes minutes face to face with the patient and more than 50% of that time was spent in counseling and/or coordination of care.   ------------------------------------------------  -----------------------------------  Billie Lade, PhD, MD

## 2012-02-15 NOTE — Progress Notes (Signed)
Patient here for new consultation of right breast cancer. ER+PR+. Menarche age 66, first pregnancy age 52, menopause ag 37 and took HRT a few years, stopped HRT 15 years ago.

## 2012-02-20 ENCOUNTER — Encounter (INDEPENDENT_AMBULATORY_CARE_PROVIDER_SITE_OTHER): Payer: Self-pay | Admitting: General Surgery

## 2012-02-20 ENCOUNTER — Ambulatory Visit (INDEPENDENT_AMBULATORY_CARE_PROVIDER_SITE_OTHER): Payer: Medicare Other | Admitting: General Surgery

## 2012-02-20 VITALS — BP 138/90 | HR 78 | Resp 18 | Ht 59.0 in | Wt 163.0 lb

## 2012-02-20 DIAGNOSIS — D0590 Unspecified type of carcinoma in situ of unspecified breast: Secondary | ICD-10-CM

## 2012-02-20 DIAGNOSIS — D0510 Intraductal carcinoma in situ of unspecified breast: Secondary | ICD-10-CM

## 2012-02-20 NOTE — Progress Notes (Addendum)
Subjective:     Patient ID: Katie Zimmerman, female   DOB: 25-Mar-1946, 66 y.o.   MRN: [id number]  HPI This is a 66 year old female with a right breast abnormality that underwent biopsy that appeared to be ductal carcinoma in situ although there was a question of some invasion. I took her to the operating room and did a right breast wire-guided lumpectomy as well as a sentinel lymph node biopsy due to the concern for invasion. Her pathology returns as ductal carcinoma in situ with obviously negative nodes. Her margins are negative. She returns today doing well the only complaint she has a some numbness in her axilla as well as around her breast incision.  Review of Systems     Objective:   Physical Exam  Pulmonary/Chest:         Assessment:     Stage 0 right breast cancer status post lumpectomy    Plan:     She's doing well postoperatively. I told her that the numbness would certainly improve over the next month or 2. She is scheduled to begin her radiation therapy likely next week. She is also going to pursue participate in  B43 and is due to see medical oncology after that. I gave her some exercises to begin doing. I also gave release go to the after breast cancer physical therapy class. I will plan on seeing her back in about 4 months to reexamine her and make sure that some of her symptoms are improving at that time.

## 2012-02-21 ENCOUNTER — Ambulatory Visit
Admission: RE | Admit: 2012-02-21 | Discharge: 2012-02-21 | Disposition: A | Discharge status: Home / Self Care | Payer: Medicare Other | Source: Ambulatory Visit | Attending: Radiation Oncology | Admitting: Radiation Oncology

## 2012-02-21 DIAGNOSIS — D0510 Intraductal carcinoma in situ of unspecified breast: Secondary | ICD-10-CM

## 2012-02-23 NOTE — Progress Notes (Signed)
  Radiation Oncology         [phone] ________________________________  Name: Katie Zimmerman MRN: [id number]  Date: 02/21/2012  DOB: 1946-02-12  SIMULATION AND TREATMENT PLANNING NOTE  DIAGNOSIS:  Intraductal carcinoma of the right breast  NARRATIVE:  The patient was brought to the CT Simulation planning suite.  Identity was confirmed.  All relevant records and images related to the planned course of therapy were reviewed.  The patient freely provided informed written consent to proceed with treatment after reviewing the details related to the planned course of therapy. The consent form was witnessed and verified by the simulation staff.  Then, the patient was set-up in a stable reproducible  supine position for radiation therapy.  CT images were obtained.  Surface markings were placed.  The CT images were loaded into the planning software.  Then the target and avoidance structures were contoured.  Treatment planning then occurred.  The radiation prescription was entered and confirmed.  A total of 3 complex treatment devices were fabricated. I have requested : Isodose Plan.  I have ordered:rad calc  PLAN:  The patient will receive 62.4 Gy in 34 fractions.  ________________________________   Billie Lade, PhD, MD

## 2012-02-27 NOTE — Addendum Note (Signed)
Encounter addended by: Sheccid Lahmann Mintz Fatuma Dowers, RN on: 02/27/2012  7:37 PM<BR>     Documentation filed: Charges VN

## 2012-02-28 ENCOUNTER — Ambulatory Visit
Admission: RE | Admit: 2012-02-28 | Discharge: 2012-02-28 | Disposition: A | Discharge status: Home / Self Care | Payer: Medicare Other | Source: Ambulatory Visit | Attending: Radiation Oncology | Admitting: Radiation Oncology

## 2012-02-28 DIAGNOSIS — D0510 Intraductal carcinoma in situ of unspecified breast: Secondary | ICD-10-CM

## 2012-02-28 NOTE — Progress Notes (Signed)
  Radiation Oncology         [phone] ________________________________  Name: Katie Zimmerman MRN: [id number]  Date: 02/28/2012  DOB: Feb 15, 1946  Simulation Verification Note  Status: outpatient  NARRATIVE: The patient was brought to the treatment unit and placed in the planned treatment position. The clinical setup was verified. Then port films were obtained and uploaded to the radiation oncology medical record software.  The treatment beams were carefully compared against the planned radiation fields. The position location and shape of the radiation fields was reviewed. They targeted volume of tissue appears to be appropriately covered by the radiation beams. Organs at risk appear to be excluded as planned.  Based on my personal review, I approved the simulation verification. The patient's treatment will proceed as planned.  -----------------------------------  Billie Lade, PhD, MD

## 2012-02-29 ENCOUNTER — Ambulatory Visit
Admission: RE | Admit: 2012-02-29 | Discharge: 2012-02-29 | Disposition: A | Discharge status: Home / Self Care | Payer: Medicare Other | Source: Ambulatory Visit | Attending: Radiation Oncology | Admitting: Radiation Oncology

## 2012-02-29 DIAGNOSIS — D0510 Intraductal carcinoma in situ of unspecified breast: Secondary | ICD-10-CM

## 2012-02-29 MED ORDER — ALRA NON-METALLIC DEODORANT (RAD-ONC)
1.0000 "application " | Freq: Once | TOPICAL | Status: AC
  Administered 2012-02-29: 1 via TOPICAL

## 2012-02-29 MED ORDER — RADIAPLEXRX EX GEL
Freq: Once | CUTANEOUS | Status: AC
  Administered 2012-02-29: 1 via TOPICAL

## 2012-02-29 NOTE — Progress Notes (Signed)
Patient teaching performed.Given radiaplex and alra deodorant.Given Radiation Therapy and You Booklet.Routine of clinic reviewed.

## 2012-03-01 ENCOUNTER — Ambulatory Visit
Admission: RE | Admit: 2012-03-01 | Discharge: 2012-03-01 | Disposition: A | Discharge status: Home / Self Care | Payer: Medicare Other | Source: Ambulatory Visit | Attending: Radiation Oncology | Admitting: Radiation Oncology

## 2012-03-02 ENCOUNTER — Ambulatory Visit
Admission: RE | Admit: 2012-03-02 | Discharge: 2012-03-02 | Disposition: A | Discharge status: Home / Self Care | Payer: Medicare Other | Source: Ambulatory Visit | Attending: Radiation Oncology | Admitting: Radiation Oncology

## 2012-03-05 ENCOUNTER — Ambulatory Visit
Admission: RE | Admit: 2012-03-05 | Discharge: 2012-03-05 | Disposition: A | Discharge status: Home / Self Care | Payer: Medicare Other | Source: Ambulatory Visit | Attending: Radiation Oncology | Admitting: Radiation Oncology

## 2012-03-06 ENCOUNTER — Ambulatory Visit: Payer: Medicare Other

## 2012-03-07 ENCOUNTER — Ambulatory Visit
Admission: RE | Admit: 2012-03-07 | Discharge: 2012-03-07 | Disposition: A | Discharge status: Home / Self Care | Payer: Medicare Other | Source: Ambulatory Visit | Attending: Radiation Oncology | Admitting: Radiation Oncology

## 2012-03-07 VITALS — BP 134/92 | HR 84 | Temp 98.6°F | Wt 165.6 lb

## 2012-03-07 DIAGNOSIS — D0510 Intraductal carcinoma in situ of unspecified breast: Secondary | ICD-10-CM

## 2012-03-07 NOTE — Progress Notes (Signed)
[facility]    Radiation Oncology [address] [place]     Maryln Gottron, M.D. [place], [place] [address]               Billie Lade, M.D., Ph.D. Phone: [phone]      Molli Hazard A. Kathrynn Running, M.D. Fax: [phone]      Radene Gunning, M.D., Ph.D.         Lurline Hare, M.D.         Grayland Jack, M.D Weekly Treatment Management Note  Name: Katie Zimmerman     MRN: [id number]        CSN: [id number] Date: 03/07/2012      DOB: 23-Sep-1945  CC: Katie Nora, MD         [name]    Status: Outpatient  Diagnosis: The encounter diagnosis was DCIS (ductal carcinoma in situ) of breast.  Current Dose: 900 cGy  Current Fraction: 5  Planned Dose: 6240  Narrative: Katie Zimmerman was seen today for weekly treatment management. The chart was checked and port films  were reviewed.  She is tolerating her radiation treatments well at this time without any side effects.  She did miss her treatment yesterday secondary to a gastrointestinal infection.  Penicillins and Sulfonamide derivatives-allergies  Current Outpatient Prescriptions  Medication Sig Dispense Refill  . amitriptyline (ELAVIL) 25 MG tablet Increase to 2 tabs daily, may increase further to 75-100 mg daily if no side effects but not reached maximal benefit.  60 tablet  5  . Calcium Carbonate-Vit D-Min (CALTRATE 600+D PLUS MINERALS) 600-800 MG-UNIT TABS Take 2 tablets by mouth 2 (two) times daily.      Marland Kitchen gabapentin (NEURONTIN) 300 MG capsule Take 2 capsules by mouth three times a day  180 capsule  3  . oxyCODONE-acetaminophen (PERCOCET/ROXICET) 5-325 MG per tablet        Labs:  Lab Results  Component Value Date   WBC 9.9 02/07/2012   HGB 13.6 02/07/2012   HCT 41.3 02/07/2012   MCV 89.3 02/07/2012   PLT 322 02/07/2012   Lab Results  Component Value Date   CREATININE 1.0 02/07/2012   BUN 15.0 02/07/2012   NA 141 02/07/2012   K 4.1 02/07/2012   CL 106 02/07/2012   CO2 25 02/07/2012   Lab Results  Component Value Date   ALT  13 02/07/2012   AST 13 02/07/2012   BILITOT 0.40 02/07/2012    Physical Examination:  weight is 165 lb 9.6 oz (75.116 kg). Her temperature is 98.6 F (37 C). Her blood pressure is 134/92 and her pulse is 84.    Wt Readings from Last 3 Encounters:  03/07/12 165 lb 9.6 oz (75.116 kg)  02/20/12 163 lb (73.936 kg)  02/15/12 164 lb 9.6 oz (74.662 kg)     Lungs - Normal respiratory effort, chest expands symmetrically. Lungs are clear to auscultation, no crackles or wheezes.  Heart has regular rhythm and rate  Abdomen is soft and non tender with normal bowel sounds The right breast area shows some mild erythema.  Assessment:  Patient tolerating treatments well  Plan: Continue treatment per original radiation prescription    -----------------------------------  Billie Lade, PhD, MD

## 2012-03-07 NOTE — Progress Notes (Signed)
Patient here for weekly under treat assessment of right breast radiation.Completed 5 of 34 treatments.No visible skin cahnages.Getting over 24 hour bug of nausea and diarrhea.No questions or concerns today.

## 2012-03-08 ENCOUNTER — Ambulatory Visit
Admission: RE | Admit: 2012-03-08 | Discharge: 2012-03-08 | Disposition: A | Discharge status: Home / Self Care | Payer: Medicare Other | Source: Ambulatory Visit | Attending: Radiation Oncology | Admitting: Radiation Oncology

## 2012-03-09 ENCOUNTER — Ambulatory Visit
Admission: RE | Admit: 2012-03-09 | Discharge: 2012-03-09 | Disposition: A | Discharge status: Home / Self Care | Payer: Medicare Other | Source: Ambulatory Visit | Attending: Radiation Oncology | Admitting: Radiation Oncology

## 2012-03-12 ENCOUNTER — Ambulatory Visit
Admission: RE | Admit: 2012-03-12 | Discharge: 2012-03-12 | Disposition: A | Discharge status: Home / Self Care | Payer: Medicare Other | Source: Ambulatory Visit | Attending: Radiation Oncology | Admitting: Radiation Oncology

## 2012-03-13 ENCOUNTER — Ambulatory Visit
Admission: RE | Admit: 2012-03-13 | Discharge: 2012-03-13 | Disposition: A | Discharge status: Home / Self Care | Payer: Medicare Other | Source: Ambulatory Visit | Attending: Radiation Oncology | Admitting: Radiation Oncology

## 2012-03-13 VITALS — BP 130/77 | HR 83 | Temp 98.0°F | Wt 166.0 lb

## 2012-03-13 DIAGNOSIS — D0510 Intraductal carcinoma in situ of unspecified breast: Secondary | ICD-10-CM

## 2012-03-13 NOTE — Progress Notes (Signed)
[facility]    Radiation Oncology [address] [place]     Maryln Gottron, M.D. [place], [place] [address]               Billie Lade, M.D., Ph.D. Phone: [phone]      Molli Hazard A. Kathrynn Running, M.D. Fax: [phone]      Radene Gunning, M.D., Ph.D.         Lurline Hare, M.D.         Grayland Jack, M.D Weekly Treatment Management Note  Name: Katie Zimmerman     MRN: [id number]        CSN: [id number] Date: 03/13/2012      DOB: 08-23-1945  CC: [name], MD         [name]    Status: Outpatient  Diagnosis: The encounter diagnosis was DCIS (ductal carcinoma in situ) of breast.  Current Dose: 1620 cGy  Current Fraction: 9/34  Planned Dose: 6240 cGy  Narrative: [name] was seen today for weekly treatment management. The chart was checked and port films  were reviewed. She is starting to have some sensitivity in the breast and nipple area.  Penicillins and Sulfonamide derivatives Current Outpatient Prescriptions  Medication Sig Dispense Refill  . amitriptyline (ELAVIL) 25 MG tablet Increase to 2 tabs daily, may increase further to 75-100 mg daily if no side effects but not reached maximal benefit.  60 tablet  5  . Calcium Carbonate-Vit D-Min (CALTRATE 600+D PLUS MINERALS) 600-800 MG-UNIT TABS Take 2 tablets by mouth 2 (two) times daily.      Marland Kitchen gabapentin (NEURONTIN) 300 MG capsule Take 2 capsules by mouth three times a day  180 capsule  3  . oxyCODONE-acetaminophen (PERCOCET/ROXICET) 5-325 MG per tablet        Labs:  Lab Results  Component Value Date   WBC 9.9 02/07/2012   HGB 13.6 02/07/2012   HCT 41.3 02/07/2012   MCV 89.3 02/07/2012   PLT 322 02/07/2012   Lab Results  Component Value Date   CREATININE 1.0 02/07/2012   BUN 15.0 02/07/2012   NA 141 02/07/2012   K 4.1 02/07/2012   CL 106 02/07/2012   CO2 25 02/07/2012   Lab Results  Component Value Date   ALT 13 02/07/2012   AST 13 02/07/2012   BILITOT 0.40 02/07/2012    Physical Examination:  weight is 166  lb (75.297 kg). Her temperature is 98 F (36.7 C). Her blood pressure is 130/77 and her pulse is 83.    Wt Readings from Last 3 Encounters:  03/13/12 166 lb (75.297 kg)  03/07/12 165 lb 9.6 oz (75.116 kg)  02/20/12 163 lb (73.936 kg)     Lungs - Normal respiratory effort, chest expands symmetrically. Lungs are clear to auscultation, no crackles or wheezes.  Heart has regular rhythm and rate  Abdomen is soft and non tender with normal bowel sounds The right breast area shows mild hyperpigmentation changes and erythema.  Assessment:  Patient tolerating treatments well  Plan: Continue treatment per original radiation prescription

## 2012-03-13 NOTE — Progress Notes (Signed)
Patient here for weekly under treat assessment of right breast radiation .Completed 9 of 34 treatments.Skin pink but patient states it fades away later in the day.Denies pain.Occassional fatigue.

## 2012-03-14 ENCOUNTER — Ambulatory Visit
Admission: RE | Admit: 2012-03-14 | Discharge: 2012-03-14 | Disposition: A | Discharge status: Home / Self Care | Payer: Medicare Other | Source: Ambulatory Visit | Attending: Radiation Oncology | Admitting: Radiation Oncology

## 2012-03-15 ENCOUNTER — Ambulatory Visit
Admission: RE | Admit: 2012-03-15 | Discharge: 2012-03-15 | Disposition: A | Discharge status: Home / Self Care | Payer: Medicare Other | Source: Ambulatory Visit | Attending: Radiation Oncology | Admitting: Radiation Oncology

## 2012-03-16 ENCOUNTER — Ambulatory Visit
Admission: RE | Admit: 2012-03-16 | Discharge: 2012-03-16 | Disposition: A | Discharge status: Home / Self Care | Payer: Medicare Other | Source: Ambulatory Visit | Attending: Radiation Oncology | Admitting: Radiation Oncology

## 2012-03-19 ENCOUNTER — Ambulatory Visit
Admission: RE | Admit: 2012-03-19 | Discharge: 2012-03-19 | Disposition: A | Discharge status: Home / Self Care | Payer: Medicare Other | Source: Ambulatory Visit | Attending: Radiation Oncology | Admitting: Radiation Oncology

## 2012-03-20 ENCOUNTER — Ambulatory Visit
Admission: RE | Admit: 2012-03-20 | Discharge: 2012-03-20 | Disposition: A | Discharge status: Home / Self Care | Payer: Medicare Other | Source: Ambulatory Visit | Attending: Radiation Oncology | Admitting: Radiation Oncology

## 2012-03-20 ENCOUNTER — Encounter: Payer: Self-pay | Admitting: Radiation Oncology

## 2012-03-20 VITALS — BP 120/72 | HR 88 | Temp 98.6°F | Resp 20 | Wt 166.4 lb

## 2012-03-20 DIAGNOSIS — D0510 Intraductal carcinoma in situ of unspecified breast: Secondary | ICD-10-CM

## 2012-03-20 NOTE — Progress Notes (Signed)
Patient here weekly rad txs right breast 14/36 completed, very slight erythema under axilla and under fold of breast, no pain today, eating well, drinking plenty fluids, had tightness under right axilla yesterday when lying for rad tx, none today, occasional twinges in breast 11:40 AM

## 2012-03-20 NOTE — Progress Notes (Signed)
[facility]    Radiation Oncology [address] [place]     Maryln Gottron, M.D. [place], [place] [address]               Billie Lade, M.D., Ph.D. Phone: [phone]      Molli Hazard A. Kathrynn Running, M.D. Fax: [phone]      Radene Gunning, M.D., Ph.D.         Lurline Hare, M.D.         Grayland Jack, M.D Weekly Treatment Management Note  Name: Katie Zimmerman     MRN: [id number]        CSN: [id number] Date: 03/20/2012      DOB: Oct 20, 1945  CC: [name], MD         [name]    Status: Outpatient  Diagnosis: The encounter diagnosis was DCIS (ductal carcinoma in situ) of breast.  Current Dose: 2520 cGy  Current Fraction: 14  Planned Dose: 6240 cGy  Narrative: [name] was seen today for weekly treatment management. The chart was checked and port films  were reviewed. no pain today, eating well, drinking plenty fluids, had tightness under right axilla yesterday when lying for rad tx, none today, occasional twinges in breast.   Penicillins and Sulfonamide derivatives Current Outpatient Prescriptions  Medication Sig Dispense Refill  . amitriptyline (ELAVIL) 25 MG tablet Increase to 2 tabs daily, may increase further to 75-100 mg daily if no side effects but not reached maximal benefit.  60 tablet  5  . Calcium Carbonate-Vit D-Min (CALTRATE 600+D PLUS MINERALS) 600-800 MG-UNIT TABS Take 2 tablets by mouth 2 (two) times daily.      Marland Kitchen gabapentin (NEURONTIN) 300 MG capsule Take 2 capsules by mouth three times a day  180 capsule  3  . oxyCODONE-acetaminophen (PERCOCET/ROXICET) 5-325 MG per tablet        Labs:  Lab Results  Component Value Date   WBC 9.9 02/07/2012   HGB 13.6 02/07/2012   HCT 41.3 02/07/2012   MCV 89.3 02/07/2012   PLT 322 02/07/2012   Lab Results  Component Value Date   CREATININE 1.0 02/07/2012   BUN 15.0 02/07/2012   NA 141 02/07/2012   K 4.1 02/07/2012   CL 106 02/07/2012   CO2 25 02/07/2012   Lab Results  Component Value Date   ALT 13 02/07/2012     AST 13 02/07/2012   BILITOT 0.40 02/07/2012    Physical Examination:  weight is 166 lb 6.4 oz (75.479 kg). Her oral temperature is 98.6 F (37 C). Her blood pressure is 120/72 and her pulse is 88. Her respiration is 20.    Wt Readings from Last 3 Encounters:  03/20/12 166 lb 6.4 oz (75.479 kg)  03/13/12 166 lb (75.297 kg)  03/07/12 165 lb 9.6 oz (75.116 kg)     Lungs - Normal respiratory effort, chest expands symmetrically. Lungs are clear to auscultation, no crackles or wheezes.  Heart has regular rhythm and rate  Abdomen is soft and non tender with normal bowel sounds Mild erythema noted in the right breast area   Assessment:  Patient tolerating treatments well  Plan: Continue treatment per original radiation prescription

## 2012-03-21 ENCOUNTER — Ambulatory Visit: Payer: Medicare Other

## 2012-03-21 ENCOUNTER — Ambulatory Visit
Admission: RE | Admit: 2012-03-21 | Discharge: 2012-03-21 | Disposition: A | Discharge status: Home / Self Care | Payer: Medicare Other | Source: Ambulatory Visit | Attending: Radiation Oncology | Admitting: Radiation Oncology

## 2012-03-22 ENCOUNTER — Ambulatory Visit: Payer: Medicare Other

## 2012-03-22 ENCOUNTER — Ambulatory Visit
Admission: RE | Admit: 2012-03-22 | Discharge: 2012-03-22 | Disposition: A | Discharge status: Home / Self Care | Payer: Medicare Other | Source: Ambulatory Visit | Attending: Radiation Oncology | Admitting: Radiation Oncology

## 2012-03-23 ENCOUNTER — Ambulatory Visit
Admission: RE | Admit: 2012-03-23 | Discharge: 2012-03-23 | Disposition: A | Discharge status: Home / Self Care | Payer: Medicare Other | Source: Ambulatory Visit | Attending: Radiation Oncology | Admitting: Radiation Oncology

## 2012-03-23 ENCOUNTER — Ambulatory Visit: Payer: Medicare Other

## 2012-03-26 ENCOUNTER — Ambulatory Visit: Payer: Medicare Other

## 2012-03-27 ENCOUNTER — Ambulatory Visit
Admission: RE | Admit: 2012-03-27 | Discharge: 2012-03-27 | Disposition: A | Discharge status: Home / Self Care | Payer: Medicare Other | Source: Ambulatory Visit | Attending: Radiation Oncology | Admitting: Radiation Oncology

## 2012-03-27 ENCOUNTER — Ambulatory Visit: Payer: Medicare Other

## 2012-03-27 VITALS — BP 135/84 | HR 76 | Temp 98.6°F | Wt 168.1 lb

## 2012-03-27 DIAGNOSIS — D0510 Intraductal carcinoma in situ of unspecified breast: Secondary | ICD-10-CM

## 2012-03-27 MED ORDER — RADIAPLEXRX EX GEL
Freq: Once | CUTANEOUS | Status: AC
  Administered 2012-03-27: 1 via TOPICAL

## 2012-03-27 NOTE — Addendum Note (Signed)
Encounter addended by: Tessa Lerner, RN on: 03/27/2012  1:25 PM<BR>     Documentation filed: Visit Diagnoses, Orders

## 2012-03-27 NOTE — Progress Notes (Signed)
Patient here for weekly under treat visit for right breast cancer radiation treatment.Mild discoloration of breast with increased redness of right axilla.Denies burning or itching but does have occasional tenderness.Mild fatigue.

## 2012-03-27 NOTE — Addendum Note (Signed)
Encounter addended by: Tessa Lerner, RN on: 03/27/2012  1:28 PM<BR>     Documentation filed: Inpatient MAR

## 2012-03-27 NOTE — Progress Notes (Signed)
[facility]    Radiation Oncology [address] [place]     Maryln Gottron, M.D. [place], [place] [address]               Billie Lade, M.D., Ph.D. Phone: [phone]      Molli Hazard A. Kathrynn Running, M.D. Fax: [phone]      Radene Gunning, M.D., Ph.D.         Lurline Hare, M.D.         Grayland Jack, M.D Weekly Treatment Management Note  Name: Katie Zimmerman     MRN: [id number]        CSN: [id number] Date: 03/27/2012      DOB: 03/07/1946  CC: [name], MD         [name]    Status: Outpatient  Diagnosis: The encounter diagnosis was DCIS (ductal carcinoma in situ) of breast.  Current Dose: 3240 cGy  Current Fraction: 18  Planned Dose: 6240 cGy  Narrative: Katie Zimmerman was seen today for weekly treatment management. The chart was checked and port films  were reviewed. Denies burning or itching but does have occasional tenderness.  Mild fatigue   Penicillins and Sulfonamide derivatives  Current Outpatient Prescriptions  Medication Sig Dispense Refill  . amitriptyline (ELAVIL) 25 MG tablet Increase to 2 tabs daily, may increase further to 75-100 mg daily if no side effects but not reached maximal benefit.  60 tablet  5  . Calcium Carbonate-Vit D-Min (CALTRATE 600+D PLUS MINERALS) 600-800 MG-UNIT TABS Take 2 tablets by mouth 2 (two) times daily.      Marland Kitchen gabapentin (NEURONTIN) 300 MG capsule Take 2 capsules by mouth three times a day  180 capsule  3  . oxyCODONE-acetaminophen (PERCOCET/ROXICET) 5-325 MG per tablet        Labs:  Lab Results  Component Value Date   WBC 9.9 02/07/2012   HGB 13.6 02/07/2012   HCT 41.3 02/07/2012   MCV 89.3 02/07/2012   PLT 322 02/07/2012   Lab Results  Component Value Date   CREATININE 1.0 02/07/2012   BUN 15.0 02/07/2012   NA 141 02/07/2012   K 4.1 02/07/2012   CL 106 02/07/2012   CO2 25 02/07/2012   Lab Results  Component Value Date   ALT 13 02/07/2012   AST 13 02/07/2012   BILITOT 0.40 02/07/2012    Physical Examination:  weight  is 168 lb 1.6 oz (76.25 kg). Her temperature is 98.6 F (37 C). Her blood pressure is 135/84 and her pulse is 76.    Wt Readings from Last 3 Encounters:  03/27/12 168 lb 1.6 oz (76.25 kg)  03/20/12 166 lb 6.4 oz (75.479 kg)  03/13/12 166 lb (75.297 kg)     Lungs - Normal respiratory effort, chest expands symmetrically. Lungs are clear to auscultation, no crackles or wheezes.  Heart has regular rhythm and rate  Abdomen is soft and non tender with normal bowel sounds The right breast area shows some mild the erythema.  Assessment:  Patient tolerating treatments well  Plan: Continue treatment per original radiation prescription

## 2012-03-28 ENCOUNTER — Ambulatory Visit: Payer: Medicare Other

## 2012-03-28 ENCOUNTER — Ambulatory Visit
Admission: RE | Admit: 2012-03-28 | Discharge: 2012-03-28 | Disposition: A | Discharge status: Home / Self Care | Payer: Medicare Other | Source: Ambulatory Visit | Attending: Radiation Oncology | Admitting: Radiation Oncology

## 2012-03-29 ENCOUNTER — Ambulatory Visit
Admission: RE | Admit: 2012-03-29 | Discharge: 2012-03-29 | Disposition: A | Discharge status: Home / Self Care | Payer: Medicare Other | Source: Ambulatory Visit | Attending: Radiation Oncology | Admitting: Radiation Oncology

## 2012-03-29 ENCOUNTER — Ambulatory Visit: Payer: Medicare Other

## 2012-03-30 ENCOUNTER — Ambulatory Visit: Payer: Medicare Other

## 2012-03-30 ENCOUNTER — Ambulatory Visit
Admission: RE | Admit: 2012-03-30 | Discharge: 2012-03-30 | Disposition: A | Discharge status: Home / Self Care | Payer: Medicare Other | Source: Ambulatory Visit | Attending: Radiation Oncology | Admitting: Radiation Oncology

## 2012-04-02 ENCOUNTER — Encounter: Payer: Self-pay | Admitting: *Deleted

## 2012-04-02 ENCOUNTER — Ambulatory Visit: Payer: Medicare Other

## 2012-04-02 ENCOUNTER — Ambulatory Visit
Admission: RE | Admit: 2012-04-02 | Discharge: 2012-04-02 | Disposition: A | Discharge status: Home / Self Care | Payer: Medicare Other | Source: Ambulatory Visit | Attending: Radiation Oncology | Admitting: Radiation Oncology

## 2012-04-02 NOTE — Progress Notes (Signed)
Mailed after appt letter to pt. 

## 2012-04-03 ENCOUNTER — Ambulatory Visit
Admission: RE | Admit: 2012-04-03 | Discharge: 2012-04-03 | Disposition: A | Discharge status: Home / Self Care | Payer: Medicare Other | Source: Ambulatory Visit | Attending: Radiation Oncology | Admitting: Radiation Oncology

## 2012-04-03 ENCOUNTER — Ambulatory Visit: Payer: Medicare Other

## 2012-04-03 VITALS — BP 138/87 | HR 85 | Temp 99.2°F | Wt 167.8 lb

## 2012-04-03 DIAGNOSIS — D0510 Intraductal carcinoma in situ of unspecified breast: Secondary | ICD-10-CM

## 2012-04-03 NOTE — Progress Notes (Signed)
[facility]    Radiation Oncology [address] [place]     Maryln Gottron, M.D. [place], [place] [address]               Billie Lade, M.D., Ph.D. Phone: [phone]      Molli Hazard A. Kathrynn Running, M.D. Fax: [phone]      Radene Gunning, M.D., Ph.D.         Lurline Hare, M.D.         Grayland Jack, M.D Weekly Treatment Management Note  Name: Katie Zimmerman     MRN: [id number]        CSN: [id number] Date: 04/03/2012      DOB: [birth year]  CC: [name], MD         [name]    Status: Outpatient  Diagnosis: The encounter diagnosis was DCIS (ductal carcinoma in situ) of breast.  Current Dose: 4140 cGy  Current Fraction: 23  Planned Dose: 6240 cGy  Narrative: [name] was seen today for weekly treatment management. The chart was checked and port films  were reviewed. Continues to tolerate the treatments well. She has some mild fatigue but denies any itching or discomfort in the breast area.  Penicillins and Sulfonamide derivatives  Current Outpatient Prescriptions  Medication Sig Dispense Refill  . amitriptyline (ELAVIL) 25 MG tablet Increase to 2 tabs daily, may increase further to 75-100 mg daily if no side effects but not reached maximal benefit.  60 tablet  5  . Calcium Carbonate-Vit D-Min (CALTRATE 600+D PLUS MINERALS) 600-800 MG-UNIT TABS Take 2 tablets by mouth 2 (two) times daily.      Marland Kitchen gabapentin (NEURONTIN) 300 MG capsule Take 2 capsules by mouth three times a day  180 capsule  3  . oxyCODONE-acetaminophen (PERCOCET/ROXICET) 5-325 MG per tablet        Labs:  Lab Results  Component Value Date   WBC 9.9 02/07/2012   HGB 13.6 02/07/2012   HCT 41.3 02/07/2012   MCV 89.3 02/07/2012   PLT 322 02/07/2012   Lab Results  Component Value Date   CREATININE 1.0 02/07/2012   BUN 15.0 02/07/2012   NA 141 02/07/2012   K 4.1 02/07/2012   CL 106 02/07/2012   CO2 25 02/07/2012   Lab Results  Component Value Date   ALT 13 02/07/2012   AST 13 02/07/2012   BILITOT 0.40  02/07/2012    Physical Examination:  weight is 167 lb 12.8 oz (76.114 kg). Her temperature is 99.2 F (37.3 C). Her blood pressure is 138/87 and her pulse is 85.    Wt Readings from Last 3 Encounters:  04/03/12 167 lb 12.8 oz (76.114 kg)  03/27/12 168 lb 1.6 oz (76.25 kg)  03/20/12 166 lb 6.4 oz (75.479 kg)     Lungs - Normal respiratory effort, chest expands symmetrically. Lungs are clear to auscultation, no crackles or wheezes.  Heart has regular rhythm and rate  Abdomen is soft and non tender with normal bowel sounds The right breast area shows some hyperpigmentation changes and mild erythema.  Assessment:  Patient tolerating treatments well  Plan: Continue treatment per original radiation prescription

## 2012-04-03 NOTE — Progress Notes (Signed)
Patient here for routine weekly under treat visit for right breast radiation treatment.Completed 23 of 34 treatments.Denies pain or burning.Generalized fatigue.

## 2012-04-04 ENCOUNTER — Ambulatory Visit: Payer: Medicare Other

## 2012-04-04 ENCOUNTER — Ambulatory Visit
Admission: RE | Admit: 2012-04-04 | Discharge: 2012-04-04 | Disposition: A | Discharge status: Home / Self Care | Payer: Medicare Other | Source: Ambulatory Visit | Attending: Radiation Oncology | Admitting: Radiation Oncology

## 2012-04-05 ENCOUNTER — Ambulatory Visit: Payer: Medicare Other

## 2012-04-05 ENCOUNTER — Ambulatory Visit
Admission: RE | Admit: 2012-04-05 | Discharge: 2012-04-05 | Disposition: A | Discharge status: Home / Self Care | Payer: Medicare Other | Source: Ambulatory Visit | Attending: Radiation Oncology | Admitting: Radiation Oncology

## 2012-04-05 ENCOUNTER — Encounter: Payer: Medicare Other | Admitting: Internal Medicine

## 2012-04-06 ENCOUNTER — Ambulatory Visit: Payer: Medicare Other

## 2012-04-09 ENCOUNTER — Ambulatory Visit: Payer: Medicare Other

## 2012-04-10 ENCOUNTER — Ambulatory Visit: Payer: Medicare Other

## 2012-04-10 ENCOUNTER — Ambulatory Visit
Admission: RE | Admit: 2012-04-10 | Discharge: 2012-04-10 | Disposition: A | Discharge status: Home / Self Care | Payer: Medicare Other | Source: Ambulatory Visit | Attending: Radiation Oncology | Admitting: Radiation Oncology

## 2012-04-10 VITALS — BP 130/81 | HR 73 | Temp 98.5°F | Wt 165.9 lb

## 2012-04-10 DIAGNOSIS — D0510 Intraductal carcinoma in situ of unspecified breast: Secondary | ICD-10-CM

## 2012-04-10 NOTE — Progress Notes (Signed)
Patient here for routine weekly assessment of right breast cancer radiation.Completed 26 of 34 treatments.Skin discolored without breaks in skin.Generalized fatigue.

## 2012-04-10 NOTE — Progress Notes (Signed)
[facility]    Radiation Oncology [address] [place]     Maryln Gottron, M.D. [place], [place] [address]               Billie Lade, M.D., Ph.D. Phone: [phone]      Molli Hazard A. Kathrynn Running, M.D. Fax: [phone]      Radene Gunning, M.D., Ph.D.         Lurline Hare, M.D.         Grayland Jack, M.D Weekly Treatment Management Note  Name: Katie Zimmerman     MRN: [id number]        CSN: [id number] Date: 04/10/2012      DOB: [birth year]  CC: [name], MD         [name]    Status: Outpatient  Diagnosis: The encounter diagnosis was DCIS (ductal carcinoma in situ) of breast.  Current Dose: 4680  Current Fraction: 26/34  Planned Dose: 6240 cGy  Narrative: Katie Zimmerman was seen today for weekly treatment management. The chart was checked and port films  were reviewed. The patient's electron setup was also reviewed today and will start later this week.  She is having some fatigue. In addition she has noticed some sharp shooting pains within the breast on occasion.  Penicillins and Sulfonamide derivatives  Current Outpatient Prescriptions  Medication Sig Dispense Refill  . amitriptyline (ELAVIL) 25 MG tablet Increase to 2 tabs daily, may increase further to 75-100 mg daily if no side effects but not reached maximal benefit.  60 tablet  5  . Calcium Carbonate-Vit D-Min (CALTRATE 600+D PLUS MINERALS) 600-800 MG-UNIT TABS Take 2 tablets by mouth 2 (two) times daily.      Marland Kitchen gabapentin (NEURONTIN) 300 MG capsule Take 2 capsules by mouth three times a day  180 capsule  3  . oxyCODONE-acetaminophen (PERCOCET/ROXICET) 5-325 MG per tablet        Labs:  Lab Results  Component Value Date   WBC 9.9 02/07/2012   HGB 13.6 02/07/2012   HCT 41.3 02/07/2012   MCV 89.3 02/07/2012   PLT 322 02/07/2012   Lab Results  Component Value Date   CREATININE 1.0 02/07/2012   BUN 15.0 02/07/2012   NA 141 02/07/2012   K 4.1 02/07/2012   CL 106 02/07/2012   CO2 25 02/07/2012   Lab Results    Component Value Date   ALT 13 02/07/2012   AST 13 02/07/2012   BILITOT 0.40 02/07/2012    Physical Examination:  weight is 165 lb 14.4 oz (75.252 kg). Her temperature is 98.5 F (36.9 C). Her blood pressure is 130/81 and her pulse is 73.    Wt Readings from Last 3 Encounters:  04/10/12 165 lb 14.4 oz (75.252 kg)  04/03/12 167 lb 12.8 oz (76.114 kg)  03/27/12 168 lb 1.6 oz (76.25 kg)    The right breast area shows some hyperpigmentation changes and mild erythema. There is no skin breakdown appreciated. Lungs - Normal respiratory effort, chest expands symmetrically. Lungs are clear to auscultation, no crackles or wheezes.  Heart has regular rhythm and rate  Abdomen is soft and non tender with normal bowel sounds  Assessment:  Patient tolerating treatments well  Plan: Continue treatment per original radiation prescription

## 2012-04-11 ENCOUNTER — Other Ambulatory Visit: Payer: Self-pay | Admitting: *Deleted

## 2012-04-11 ENCOUNTER — Ambulatory Visit
Admission: RE | Admit: 2012-04-11 | Discharge: 2012-04-11 | Disposition: A | Discharge status: Home / Self Care | Payer: Medicare Other | Source: Ambulatory Visit | Attending: Radiation Oncology | Admitting: Radiation Oncology

## 2012-04-11 ENCOUNTER — Ambulatory Visit: Payer: Medicare Other

## 2012-04-12 ENCOUNTER — Ambulatory Visit
Admission: RE | Admit: 2012-04-12 | Discharge: 2012-04-12 | Disposition: A | Discharge status: Home / Self Care | Payer: Medicare Other | Source: Ambulatory Visit | Attending: Radiation Oncology | Admitting: Radiation Oncology

## 2012-04-12 ENCOUNTER — Ambulatory Visit: Payer: Medicare Other

## 2012-04-12 MED ORDER — GABAPENTIN 300 MG PO CAPS: ORAL_CAPSULE | ORAL | Status: DC

## 2012-04-13 ENCOUNTER — Ambulatory Visit: Payer: Medicare Other

## 2012-04-13 ENCOUNTER — Ambulatory Visit
Admission: RE | Admit: 2012-04-13 | Discharge: 2012-04-13 | Disposition: A | Discharge status: Home / Self Care | Payer: Medicare Other | Source: Ambulatory Visit | Attending: Radiation Oncology | Admitting: Radiation Oncology

## 2012-04-16 ENCOUNTER — Ambulatory Visit: Payer: Medicare Other

## 2012-04-16 ENCOUNTER — Ambulatory Visit
Admission: RE | Admit: 2012-04-16 | Discharge: 2012-04-16 | Disposition: A | Discharge status: Home / Self Care | Payer: Medicare Other | Source: Ambulatory Visit | Attending: Radiation Oncology | Admitting: Radiation Oncology

## 2012-04-17 ENCOUNTER — Encounter: Payer: Self-pay | Admitting: Radiation Oncology

## 2012-04-17 ENCOUNTER — Ambulatory Visit
Admission: RE | Admit: 2012-04-17 | Discharge: 2012-04-17 | Disposition: A | Discharge status: Home / Self Care | Payer: Medicare Other | Source: Ambulatory Visit | Attending: Radiation Oncology | Admitting: Radiation Oncology

## 2012-04-17 ENCOUNTER — Ambulatory Visit: Payer: Medicare Other

## 2012-04-17 VITALS — BP 145/90 | HR 88 | Temp 97.8°F | Resp 20 | Wt 164.8 lb

## 2012-04-17 DIAGNOSIS — D0510 Intraductal carcinoma in situ of unspecified breast: Secondary | ICD-10-CM

## 2012-04-17 NOTE — Progress Notes (Signed)
[facility]    Radiation Oncology [address] [place]     Maryln Gottron, M.D. [place], [place] [address]               Billie Lade, M.D., Ph.D. Phone: [phone]      Molli Hazard A. Kathrynn Running, M.D. Fax: [phone]      Radene Gunning, M.D., Ph.D.         Lurline Hare, M.D.         Grayland Jack, M.D Weekly Treatment Management Note  Name: Katie Zimmerman     MRN: [id number]        CSN: [id number] Date: 04/17/2012      DOB: 10-03-1945  CC: [name], MD         [name]    Status: Outpatient  Diagnosis: The encounter diagnosis was DCIS (ductal carcinoma in situ) of breast.  Current Dose: 5640 cGy  Current Fraction: 31  Planned Dose: 6240 cGy  Narrative: [name] was seen today for weekly treatment management. The chart was checked and port films  were reviewed. She continues to tolerate his treatments well with minimal fatigue  itching or discomfort within the breast area.      Physical Examination:  weight is 164 lb 12.8 oz (74.753 kg). Her oral temperature is 97.8 F (36.6 C). Her blood pressure is 145/90 and her pulse is 88. Her respiration is 20.    Wt Readings from Last 3 Encounters:  04/17/12 164 lb 12.8 oz (74.753 kg)  04/10/12 165 lb 14.4 oz (75.252 kg)  04/03/12 167 lb 12.8 oz (76.114 kg)     Lungs - Normal respiratory effort, chest expands symmetrically. Lungs are clear to auscultation, no crackles or wheezes.  Heart has regular rhythm and rate  Abdomen is soft and non tender with normal bowel sounds The right breast area shows some hyperpigmentation changes and erythema but no moist desquamation  Assessment:  Patient tolerating treatments well  Plan: Continue treatment per original radiation prescription

## 2012-04-17 NOTE — Progress Notes (Signed)
Patient here weekly rad txs:31/34 completed right breast,,alert,oriented x3, erythema and under right arm,peeling, and under inframmary fold of breast also peeling, numbness under  Aarm, tenderness, and pain occasional in breast, 3 more treatments and patient completes,uses radiaplex gel bid 3:14 PM

## 2012-04-18 ENCOUNTER — Ambulatory Visit: Payer: Medicare Other

## 2012-04-18 ENCOUNTER — Ambulatory Visit
Admission: RE | Admit: 2012-04-18 | Discharge: 2012-04-18 | Disposition: A | Discharge status: Home / Self Care | Payer: Medicare Other | Source: Ambulatory Visit | Attending: Radiation Oncology | Admitting: Radiation Oncology

## 2012-04-19 ENCOUNTER — Ambulatory Visit: Payer: Medicare Other

## 2012-04-20 ENCOUNTER — Ambulatory Visit
Admission: RE | Admit: 2012-04-20 | Discharge: 2012-04-20 | Disposition: A | Discharge status: Home / Self Care | Payer: Medicare Other | Source: Ambulatory Visit | Attending: Radiation Oncology | Admitting: Radiation Oncology

## 2012-04-21 ENCOUNTER — Ambulatory Visit
Admission: RE | Admit: 2012-04-21 | Discharge: 2012-04-21 | Disposition: A | Discharge status: Home / Self Care | Payer: Medicare Other | Source: Ambulatory Visit | Attending: Radiation Oncology | Admitting: Radiation Oncology

## 2012-04-21 DIAGNOSIS — D0510 Intraductal carcinoma in situ of unspecified breast: Secondary | ICD-10-CM

## 2012-04-21 MED ORDER — RADIAPLEXRX EX GEL
Freq: Once | CUTANEOUS | Status: AC
  Administered 2012-04-21: 07:00:00 via TOPICAL

## 2012-04-21 NOTE — Progress Notes (Signed)
Received patient in the clinic today accompanied by husband following final radiation treatment. Patient alert and oriented to person, place, and time. No distress noted. Steady gait noted. Pleasant affect noted. Patient denies pain at this time. Patient reports only minimal fatigue. Hyperpigmentation without moist desquamation of right/treated breast noted. Provided patient with tube of radiaplex gel and requested she continue to apply this gel bid for at least the next two weeks. Provided patient with a one month follow up appointment card but, encouraged patient to call with needs in the interim. Informed patient that someone would be contacting her reference FYNN. Patient verbalized understanding of all reviewed.

## 2012-04-23 ENCOUNTER — Ambulatory Visit: Payer: Medicare Other

## 2012-05-03 ENCOUNTER — Encounter: Payer: Self-pay | Admitting: Internal Medicine

## 2012-05-03 ENCOUNTER — Ambulatory Visit (AMBULATORY_SURGERY_CENTER): Payer: Medicare Other | Admitting: *Deleted

## 2012-05-03 VITALS — Ht 59.0 in | Wt 164.8 lb

## 2012-05-03 DIAGNOSIS — Z1211 Encounter for screening for malignant neoplasm of colon: Secondary | ICD-10-CM

## 2012-05-03 MED ORDER — NA SULFATE-K SULFATE-MG SULF 17.5-3.13-1.6 GM/177ML PO SOLN: ORAL | Status: DC

## 2012-05-07 ENCOUNTER — Encounter: Payer: Self-pay | Admitting: Medical Oncology

## 2012-05-07 ENCOUNTER — Encounter: Payer: Self-pay | Admitting: Radiation Oncology

## 2012-05-07 DIAGNOSIS — D0510 Intraductal carcinoma in situ of unspecified breast: Secondary | ICD-10-CM

## 2012-05-07 NOTE — Progress Notes (Signed)
  Radiation Oncology         [phone] ________________________________  Name: Katie Zimmerman MRN: [id number]  Date: 05/07/2012  DOB: Sep 04, 1945  End of Treatment Note  Diagnosis:   Intraductal carcinoma of the right breast     Indication for treatment:  Breast conservation therapy       Radiation treatment dates:   02/29/2012 through 04/21/2012  Site/dose:   Right breast, 5040 cGy in 28 fractions;  the site of presentation in the lower outer quadrant of the right breast area was boosted further to a cumulative dose of 6240 centigray  Beams/energy:   Tangential fields encompassing the right breast with her initial treatment.  the site of presentation was boosted with a custom electron cutout field using 15 MeV  electrons  Narrative: The patient tolerated radiation treatment relatively well.   She had minimal fatigue, discomfort and itching within the breast area.  Plan: The patient has completed radiation treatment. The patient will return to radiation oncology clinic for routine followup in one month. I advised them to call or return sooner if they have any questions or concerns related to their recovery or treatment.  -----------------------------------  Billie Lade, PhD, MD

## 2012-05-07 NOTE — Progress Notes (Signed)
   Department of Radiation Oncology  Phone:  [phone] Fax:        [phone]   Electron beam simulation note  On 04/03/2012 the patient had set up of her electron boost field directed at the lower outer quadrant of the right breast.  The patient will be treated with 15 MeV electrons using a custom electron cutout field. A special port plan is requested for treatment  -----------------------------------  Katie Lade, PhD, MD

## 2012-05-08 ENCOUNTER — Encounter: Payer: Self-pay | Admitting: Oncology

## 2012-05-08 ENCOUNTER — Telehealth: Payer: Self-pay | Admitting: Oncology

## 2012-05-08 ENCOUNTER — Other Ambulatory Visit (HOSPITAL_BASED_OUTPATIENT_CLINIC_OR_DEPARTMENT_OTHER): Payer: Medicare Other | Admitting: Lab

## 2012-05-08 ENCOUNTER — Ambulatory Visit (HOSPITAL_BASED_OUTPATIENT_CLINIC_OR_DEPARTMENT_OTHER): Payer: Medicare Other | Admitting: Oncology

## 2012-05-08 VITALS — BP 128/81 | HR 85 | Temp 98.2°F | Resp 20 | Ht 59.0 in | Wt 165.8 lb

## 2012-05-08 DIAGNOSIS — D0590 Unspecified type of carcinoma in situ of unspecified breast: Secondary | ICD-10-CM

## 2012-05-08 DIAGNOSIS — Z170 Estrogen receptor positive status [ER+]: Secondary | ICD-10-CM

## 2012-05-08 DIAGNOSIS — D0510 Intraductal carcinoma in situ of unspecified breast: Secondary | ICD-10-CM

## 2012-05-08 LAB — COMPREHENSIVE METABOLIC PANEL (CC13)
ALT: 20 U/L (ref 0–55)
AST: 16 U/L (ref 5–34)
Albumin: 3.6 g/dL (ref 3.5–5.0)
Alkaline Phosphatase: 106 U/L (ref 40–150)
BUN: 19 mg/dL (ref 7.0–26.0)
CO2: 26 mEq/L (ref 22–29)
Calcium: 9.5 mg/dL (ref 8.4–10.4)
Chloride: 107 mEq/L (ref 98–107)
Creatinine: 0.9 mg/dL (ref 0.6–1.1)
Glucose: 98 mg/dl (ref 70–99)
Potassium: 4.4 mEq/L (ref 3.5–5.1)
Sodium: 143 mEq/L (ref 136–145)
Total Bilirubin: 0.38 mg/dL (ref 0.20–1.20)
Total Protein: 7 g/dL (ref 6.4–8.3)

## 2012-05-08 LAB — CBC WITH DIFFERENTIAL/PLATELET
BASO%: 0.8 % (ref 0.0–2.0)
Basophils Absolute: 0.1 10*3/uL (ref 0.0–0.1)
EOS%: 5.1 % (ref 0.0–7.0)
Eosinophils Absolute: 0.3 10*3/uL (ref 0.0–0.5)
HCT: 41.9 % (ref 34.8–46.6)
HGB: 13.8 g/dL (ref 11.6–15.9)
LYMPH%: 20.7 % (ref 14.0–49.7)
MCH: 29.9 pg (ref 25.1–34.0)
MCHC: 32.9 g/dL (ref 31.5–36.0)
MCV: 90.8 fL (ref 79.5–101.0)
MONO#: 0.5 10*3/uL (ref 0.1–0.9)
MONO%: 7.7 % (ref 0.0–14.0)
NEUT#: 4.1 10*3/uL (ref 1.5–6.5)
NEUT%: 65.7 % (ref 38.4–76.8)
Platelets: 287 10*3/uL (ref 145–400)
RBC: 4.62 10*6/uL (ref 3.70–5.45)
RDW: 13.3 % (ref 11.2–14.5)
WBC: 6.3 10*3/uL (ref 3.9–10.3)
lymph#: 1.3 10*3/uL (ref 0.9–3.3)

## 2012-05-08 MED ORDER — TAMOXIFEN CITRATE 20 MG PO TABS: 20.0000 mg | ORAL_TABLET | Freq: Every day | ORAL | Status: DC

## 2012-05-08 NOTE — Progress Notes (Signed)
OFFICE PROGRESS NOTE  CC  [name], MD [address] [place] [address]  DIAGNOSIS: 66 year old female with DCIS of the right breast status post lumpectomy  PRIOR THERAPY:  #1 assure originally underwent a screening mammogram that showed a right-sided abnormality. She had an ultrasound of the mass performed that showed a 9 x 8 x 5 mm area of concern. Needle core biopsy showed a ER-positive DCIS. Was low grade. Tumor was ER +100% PR positive. She went on to have a lumpectomy with sentinel lymph node biopsy. The lumpectomy final path showed a 1.0 cm low grade DCIS with lobular neoplasia all surgical margins were -3 sentinel nodes were negative for metastatic disease.  #2 patient then went on to receive adjuvant radiation therapy. She completed this few weeks ago. Overall she tolerated it well without any significant problems.  #3 she will now begin tamoxifen 20 mg daily adjuvantly as a chemopreventive. Total of 5 years of therapy is planned.  CURRENT THERAPY: Tamoxifen 20 mg daily starting 05/08/2012  INTERVAL HISTORY: Katie Zimmerman 66 y.o. female returns for followup visit post radiation. Overall she did well without any significant complaints she denies any fevers chills night sweats headaches shortness of breath chest pains palpitations no myalgias and arthralgias. Remainder of the 10 point review of systems is negative.  MEDICAL HISTORY: Past Medical History  Diagnosis Date  . GERD (gastroesophageal reflux disease)   . Osteoporosis   . Nerve damage 2000    Right side of face after Shingles  . Complication of anesthesia     hard to wake up age 38-not since  . Cancer 12/2011    breast    ALLERGIES:  is allergic to penicillins and sulfonamide derivatives.  MEDICATIONS:  Current Outpatient Prescriptions  Medication Sig Dispense Refill  . amitriptyline (ELAVIL) 25 MG tablet Increase to 2 tabs daily, may increase further to 75-100 mg daily if  no side effects but not reached maximal benefit.  60 tablet  5  . Calcium Carbonate-Vit D-Min (CALTRATE 600+D PLUS MINERALS) 600-800 MG-UNIT TABS Take 2 tablets by mouth 2 (two) times daily.      Marland Kitchen gabapentin (NEURONTIN) 300 MG capsule Take 2 capsules by mouth three times a day  180 capsule  3  . oxyCODONE-acetaminophen (PERCOCET/ROXICET) 5-325 MG per tablet       . Na Sulfate-K Sulfate-Mg Sulf (SUPREP BOWEL PREP) SOLN suprep as directed.  No substitutions  354 mL  0    SURGICAL HISTORY:  Past Surgical History  Procedure Date  . Ovarian cyst surgery 1964  . Tubal ligation 1976  . Foot surgery 1996    rt/lt great toes  . Glaucoma surgery 1993    laser  . Appendectomy   . Biopsy breast 01/10/12    Right breast needle cor biopsy  . Breast lumpectomy 01/26/2012    Right/ERPR+ DCIS, right ax snbx    REVIEW OF SYSTEMS:  A comprehensive review of systems was negative.   HEALTH MAINTENANCE:  PHYSICAL EXAMINATION: Blood pressure 128/81, pulse 85, temperature 98.2 F (36.8 C), temperature source Oral, resp. rate 20, height 4\' 11"  (1.499 m), weight 165 lb 12.8 oz (75.206 kg). Body mass index is 33.49 kg/(m^2). ECOG PERFORMANCE STATUS: 0 - Asymptomatic   General appearance: alert, cooperative and appears stated age Lymph nodes: Cervical, supraclavicular, and axillary nodes normal. Resp: clear to auscultation bilaterally Back: symmetric, no curvature. ROM normal. No CVA tenderness. Cardio: regular rate and rhythm GI:  soft, non-tender; bowel sounds normal; no masses,  no organomegaly Extremities: extremities normal, atraumatic, no cyanosis or edema Neurologic: Grossly normal  Right breast reveals well-healed right lumpectomy scar and sentinel lymph node scars. There is some darkening of the skin due to radiation otherwise breast exam is normal LABORATORY DATA: Lab Results  Component Value Date   WBC 6.3 05/08/2012   HGB 13.8 05/08/2012   HCT 41.9 05/08/2012   MCV 90.8 05/08/2012    PLT 287 05/08/2012      Chemistry      Component Value Date/Time   NA 141 02/07/2012 1524   NA 139 01/25/2012 1230   K 4.1 02/07/2012 1524   K 4.1 01/25/2012 1230   CL 106 02/07/2012 1524   CL 104 01/25/2012 1230   CO2 25 02/07/2012 1524   CO2 24 01/25/2012 1230   BUN 15.0 02/07/2012 1524   BUN 14 01/25/2012 1230   CREATININE 1.0 02/07/2012 1524   CREATININE 0.84 01/25/2012 1230      Component Value Date/Time   CALCIUM 10.3 02/07/2012 1524   CALCIUM 10.0 01/25/2012 1230   ALKPHOS 87 02/07/2012 1524   ALKPHOS 82 10/27/2011 1025   AST 13 02/07/2012 1524   AST 15 10/27/2011 1025   ALT 13 02/07/2012 1524   ALT 13 10/27/2011 1025   BILITOT 0.40 02/07/2012 1524   BILITOT 0.6 10/27/2011 1025    Diagnosis 1. Breast, lumpectomy, Right - DUCTAL CARCINOMA IN SITU, LOW GRADE, SPANNING 1.0 CM. - LOBULAR NEOPLASIA (ATYPICAL LOBULAR HYPERPLASIA). - THE SURGICAL RESECTION MARGINS ARE NEGATIVE FOR DUCTAL CARCINOMA. - SEE ONCOLOGY TABLE BELOW. 2. Lymph node, sentinel, biopsy, Right axillary - THERE IS NO EVIDENCE OF CARCINOMA IN 1 OF 1 LYMPH NODE (0/1). 3. Lymph node, sentinel, biopsy, Right axillary - THERE IS NO EVIDENCE OF CARCINOMA IN 1 OF 1 LYMPH NODE (0/1). - SEE COMMENT. 4. Lymph node, sentinel, biopsy, Right axillary - THERE IS NO EVIDENCE OF CARCINOMA IN 1 OF 1 LYMPH NODE (0/1). Microscopic Comment 1. BREAST, IN SITU CARCINOMA Specimen, including laterality: Right breast Procedure: Needle localized lumpectomy Grade of carcinoma: Low grade Necrosis: Not identified Estimated tumor size: (gross measurement): 1.0 cm Treatment effect: N/A Distance to closest margin: 0.5 cm to posterior margin (gross measurement) Breast prognostic profile: [phone] Estrogen receptor: 100%, strong staining intensity Progesterone receptor: 100%, strong staining intensity Lymph nodes: Examined: 3 Lymph nodes with metastasis: 0 TNM: pTis, pN0 Comments: The carcinoma cells are negative for cytokeratin 5/6.  P63, smooth muscle myosin and calponin stains 1 of 3 Duplicate copy   RADIOGRAPHIC STUDIES:  No results found.  ASSESSMENT: 66 year old female with  #1 low grade DCIS measuring 1.0 cm status post lumpectomy in August 2013. Final pathology revealed that ER positive PR positive DCIS 3 sentinel nodes negative for disease. She is now status post adjuvant radiation therapy completed in November 2013.  #2 she is a good candidate for chemoprevention with tamoxifen 20 mg daily for her DCIS. Risks and benefits of tamoxifen have been discussed with her.   PLAN:   #1 patient will proceed with tamoxifen 20 mg daily.  #2 she is recommended to see her gynecologist ophthalmologist on a yearly basis. If she develops any side effects from tamoxifen she is to call me immediately.  #3 I will plan on seeing her back in 3 months time or sooner if need arises.   All questions were answered. The patient knows to call the clinic with any problems, questions or concerns. We can certainly  see the patient much sooner if necessary.  I spent 25 minutes counseling the patient face to face. The total time spent in the appointment was 30 minutes.    Drue Second, MD Medical/Oncology [facility] [phone] (beeper) [phone] (Office)  05/08/2012, 10:34 AM

## 2012-05-08 NOTE — Telephone Encounter (Signed)
gve the pt her march 2014 appt calendar °

## 2012-05-08 NOTE — Patient Instructions (Addendum)
Proceed with tamoxifen 20 mg daily. Information as below  We will see you back in 3 months for follow up  Tamoxifen oral tablet What is this medicine? TAMOXIFEN (ta MOX i fen) blocks the effects of estrogen. It is commonly used to treat breast cancer. It is also used to decrease the chance of breast cancer coming back in women who have received treatment for the disease. It may also help prevent breast cancer in women who have a high risk of developing breast cancer. This medicine may be used for other purposes; ask your health care provider or pharmacist if you have questions. What should I tell my health care provider before I take this medicine? They need to know if you have any of these conditions: -blood clots -blood disease -cataracts or impaired eyesight -endometriosis -high calcium levels -high cholesterol -irregular menstrual cycles -liver disease -stroke -uterine fibroids -an unusual or allergic reaction to tamoxifen, other medicines, foods, dyes, or preservatives -pregnant or trying to get pregnant -breast-feeding How should I use this medicine? Take this medicine by mouth with a glass of water. Follow the directions on the prescription label. You can take it with or without food. Take your medicine at regular intervals. Do not take your medicine more often than directed. Do not stop taking except on your doctor's advice. A special MedGuide will be given to you by the pharmacist with each prescription and refill. Be sure to read this information carefully each time. Talk to your pediatrician regarding the use of this medicine in children. While this drug may be prescribed for selected conditions, precautions do apply. Overdosage: If you think you have taken too much of this medicine contact a poison control center or emergency room at once. NOTE: This medicine is only for you. Do not share this medicine with others. What if I miss a dose? If you miss a dose, take it as soon  as you can. If it is almost time for your next dose, take only that dose. Do not take double or extra doses. What may interact with this medicine? -aminoglutethimide -bromocriptine -chemotherapy drugs -female hormones, like estrogens and birth control pills -letrozole -medroxyprogesterone -phenobarbital -rifampin -warfarin This list may not describe all possible interactions. Give your health care provider a list of all the medicines, herbs, non-prescription drugs, or dietary supplements you use. Also tell them if you smoke, drink alcohol, or use illegal drugs. Some items may interact with your medicine. What should I watch for while using this medicine? Visit your doctor or health care professional for regular checks on your progress. You will need regular pelvic exams, breast exams, and mammograms. If you are taking this medicine to reduce your risk of getting breast cancer, you should know that this medicine does not prevent all types of breast cancer. If breast cancer or other problems occur, there is no guarantee that it will be found at an early stage. Do not become pregnant while taking this medicine or for 2 months after stopping this medicine. Stop taking this medicine if you get pregnant or think you are pregnant and contact your doctor. This medicine may harm your unborn baby. Women who can possibly become pregnant should use birth control methods that do not use hormones during tamoxifen treatment and for 2 months after therapy has stopped. Talk with your health care provider for birth control advice. Do not breast feed while taking this medicine. What side effects may I notice from receiving this medicine? Side effects that you should  report to your doctor or health care professional as soon as possible: -changes in vision (blurred vision) -changes in your menstrual cycle -difficulty breathing or shortness of breath -difficulty walking or talking -new breast  lumps -numbness -pelvic pain or pressure -redness, blistering, peeling or loosening of the skin, including inside the mouth -skin rash or itching (hives) -sudden chest pain -swelling of lips, face, or tongue -swelling, pain or tenderness in your calf or leg -unusual bruising or bleeding -vaginal discharge that is bloody, brown, or rust -weakness -yellowing of the whites of the eyes or skin Side effects that usually do not require medical attention (report to your doctor or health care professional if they continue or are bothersome): -fatigue -hair loss, although uncommon and is usually mild -headache -hot flashes -impotence (in men) -nausea, vomiting (mild) -vaginal discharge (white or clear) This list may not describe all possible side effects. Call your doctor for medical advice about side effects. You may report side effects to FDA at 1-800-FDA-1088. Where should I keep my medicine? Keep out of the reach of children. Store at room temperature between 20 and 25 degrees C (68 and 77 degrees F). Protect from light. Keep container tightly closed. Throw away any unused medicine after the expiration date. NOTE: This sheet is a summary. It may not cover all possible information. If you have questions about this medicine, talk to your doctor, pharmacist, or health care provider.  2012, Elsevier/Gold Standard. (01/31/2008 12:01:56 PM)

## 2012-05-10 ENCOUNTER — Ambulatory Visit: Payer: Medicare Other | Admitting: Radiation Oncology

## 2012-05-16 DIAGNOSIS — D126 Benign neoplasm of colon, unspecified: Secondary | ICD-10-CM

## 2012-05-17 ENCOUNTER — Ambulatory Visit (AMBULATORY_SURGERY_CENTER): Payer: Medicare Other | Admitting: Internal Medicine

## 2012-05-17 ENCOUNTER — Encounter: Payer: Self-pay | Admitting: Internal Medicine

## 2012-05-17 VITALS — BP 149/76 | HR 75 | Temp 98.3°F | Resp 20 | Ht 59.0 in | Wt 164.0 lb

## 2012-05-17 DIAGNOSIS — D126 Benign neoplasm of colon, unspecified: Secondary | ICD-10-CM

## 2012-05-17 DIAGNOSIS — Z1211 Encounter for screening for malignant neoplasm of colon: Secondary | ICD-10-CM

## 2012-05-17 DIAGNOSIS — K5730 Diverticulosis of large intestine without perforation or abscess without bleeding: Secondary | ICD-10-CM

## 2012-05-17 MED ORDER — SODIUM CHLORIDE 0.9 % IV SOLN: 500.0000 mL | INTRAVENOUS | Status: DC

## 2012-05-17 NOTE — Progress Notes (Signed)
Patient did not experience any of the following events: a burn prior to discharge; a fall within the facility; wrong site/side/patient/procedure/implant event; or a hospital transfer or hospital admission upon discharge from the facility. (G8907) Patient did not have preoperative order for IV antibiotic SSI prophylaxis. (G8918)  

## 2012-05-17 NOTE — Patient Instructions (Addendum)
5 polyps were removed, 4 were recovered and sent to pathology. They were all small and I think they were benign. Do not worry about these - I will notify you by mail about the pathology results. I suspect I will recommend you come back and repeat a colonoscopy in about 3 years.  Thank you for choosing me and [facility].  Katie Boop, MD, FACG  YOU HAD AN ENDOSCOPIC PROCEDURE TODAY AT THE [facility]: Refer to the procedure report that was given to you for any specific questions about what was found during the examination.  If the procedure report does not answer your questions, please call your gastroenterologist to clarify.  If you requested that your care partner not be given the details of your procedure findings, then the procedure report has been included in a sealed envelope for you to review at your convenience later.  YOU SHOULD EXPECT: Some feelings of bloating in the abdomen. Passage of more gas than usual.  Walking can help get rid of the air that was put into your GI tract during the procedure and reduce the bloating. If you had a lower endoscopy (such as a colonoscopy or flexible sigmoidoscopy) you may notice spotting of blood in your stool or on the toilet paper. If you underwent a bowel prep for your procedure, then you may not have a normal bowel movement for a few days.  DIET: Your first meal following the procedure should be a light meal and then it is ok to progress to your normal diet.  A half-sandwich or bowl of soup is an example of a good first meal.  Heavy or fried foods are harder to digest and may make you feel nauseous or bloated.  Likewise meals heavy in dairy and vegetables can cause extra gas to form and this can also increase the bloating.  Drink plenty of fluids but you should avoid alcoholic beverages for 24 hours.  ACTIVITY: Your care partner should take you home directly after the procedure.  You should plan to take it easy, moving  slowly for the rest of the day.  You can resume normal activity the day after the procedure however you should NOT DRIVE or use heavy machinery for 24 hours (because of the sedation medicines used during the test).    SYMPTOMS TO REPORT IMMEDIATELY: A gastroenterologist can be reached at any hour.  During normal business hours, 8:30 AM to 5:00 PM Monday through Friday, call [phone].  After hours and on weekends, please call the GI answering service at [phone] who will take a message and have the physician on call contact you.   Following lower endoscopy (colonoscopy or flexible sigmoidoscopy):  Excessive amounts of blood in the stool  Significant tenderness or worsening of abdominal pains  Swelling of the abdomen that is new, acute  Fever of 100F or higher   FOLLOW UP: If any biopsies were taken you will be contacted by phone or by letter within the next 1-3 weeks.  Call your gastroenterologist if you have not heard about the biopsies in 3 weeks.  Our staff will call the home number listed on your records the next business day following your procedure to check on you and address any questions or concerns that you may have at that time regarding the information given to you following your procedure. This is a courtesy call and so if there is no answer at the home number and we have not heard from you  through the emergency physician on call, we will assume that you have returned to your regular daily activities without incident.  SIGNATURES/CONFIDENTIALITY: You and/or your care partner have signed paperwork which will be entered into your electronic medical record.  These signatures attest to the fact that that the information above on your After Visit Summary has been reviewed and is understood.  Full responsibility of the confidentiality of this discharge information lies with you and/or your care-partner.   Polyps-handout given  Diverticulosis-handout given  High Fiber  Diet-handout given

## 2012-05-17 NOTE — Op Note (Addendum)
[facility] [address] [place], [address]   COLONOSCOPY PROCEDURE REPORT  PATIENT: Emmagrace, Runkel  MR#: [id number] BIRTHDATE: [birth year] , 66  yrs. old GENDER: Female ENDOSCOPIST: Iva Boop, MD, [facility] REFERRED BY:Amy Michelle Nasuti, M.D. PROCEDURE DATE:  05/17/2012 PROCEDURE:   Colonoscopy with snare polypectomy ASA CLASS:   Class II INDICATIONS:average risk screening. MEDICATIONS: propofol (Diprivan) 250mg  IV, MAC sedation, administered by CRNA, and These medications were titrated to patient response per physician's verbal order  DESCRIPTION OF PROCEDURE:   After the risks benefits and alternatives of the procedure were thoroughly explained, informed consent was obtained.  A digital rectal exam revealed no abnormalities of the rectum.   The LB [id number]  endoscope was introduced through the anus and advanced to the cecum, which was identified by both the appendix and ileocecal valve. No adverse events experienced.   The quality of the prep was Suprep excellent The instrument was then slowly withdrawn as the colon was fully examined.      COLON FINDINGS: Five polypoid shaped sessile polyps measuring 3-6 mm in size were found at the cecum (2), in the ascending colon (2), and descending colon (1).  A polypectomy was performed with a cold snare.  The resection was complete and the polyp tissue was partially retrieved. One right colon polyp not recovered. Moderate diverticulosis was noted in the sigmoid colon.   The colon mucosa was otherwise normal.  Retroflexed views revealed no abnormalities. The time to cecum=2 minutes 52 seconds.  Withdrawal time=13 minutes 25 seconds.  The scope was withdrawn and the procedure completed. COMPLICATIONS: There were no complications.   ENDOSCOPIC IMPRESSION: 1.   Five sessile polyps measuring 3-6 mm in size were found at the cecum, in the ascending colon, and descending colon; polypectomy was performed with a  cold snare 2.   Moderate diverticulosis was noted in the sigmoid colon 3.   The colon mucosa was otherwise normal - excellent prep  RECOMMENDATIONS: Timing of repeat colonoscopy will be determined by pathology findings.   eSigned:  Iva Boop, MD, [facility] 05/17/2012 11:16 AM Revised: 05/17/2012 11:16 AM  cc: Excell Seltzer, MD and The Patient   PATIENT NAME:  Debora, [place] MR#: [id number]

## 2012-05-18 ENCOUNTER — Telehealth: Payer: Self-pay | Admitting: *Deleted

## 2012-05-18 ENCOUNTER — Other Ambulatory Visit (INDEPENDENT_AMBULATORY_CARE_PROVIDER_SITE_OTHER): Payer: Medicare Other

## 2012-05-18 DIAGNOSIS — R7303 Prediabetes: Secondary | ICD-10-CM

## 2012-05-18 DIAGNOSIS — R7309 Other abnormal glucose: Secondary | ICD-10-CM

## 2012-05-18 DIAGNOSIS — E7800 Pure hypercholesterolemia, unspecified: Secondary | ICD-10-CM

## 2012-05-18 LAB — LIPID PANEL
Cholesterol: 187 mg/dL (ref 0–200)
HDL: 44.9 mg/dL (ref >39.00)
LDL Cholesterol: 124 mg/dL — ABNORMAL HIGH (ref 0–99)
Total CHOL/HDL Ratio: 4
Triglycerides: 93 mg/dL (ref 0.0–149.0)
VLDL: 18.6 mg/dL (ref 0.0–40.0)

## 2012-05-18 LAB — COMPREHENSIVE METABOLIC PANEL
ALT: 19 U/L (ref 0–35)
AST: 16 U/L (ref 0–37)
Albumin: 3.6 g/dL (ref 3.5–5.2)
Alkaline Phosphatase: 83 U/L (ref 39–117)
BUN: 17 mg/dL (ref 6–23)
CO2: 20 mEq/L (ref 19–32)
Calcium: 9 mg/dL (ref 8.4–10.5)
Chloride: 111 mEq/L (ref 96–112)
Creatinine, Ser: 0.9 mg/dL (ref 0.4–1.2)
GFR: 68.25 mL/min (ref >60.00)
Glucose, Bld: 121 mg/dL — ABNORMAL HIGH (ref 70–99)
Potassium: 3.7 mEq/L (ref 3.5–5.1)
Sodium: 139 mEq/L (ref 135–145)
Total Bilirubin: 0.4 mg/dL (ref 0.3–1.2)
Total Protein: 7.1 g/dL (ref 6.0–8.3)

## 2012-05-18 NOTE — Telephone Encounter (Signed)
  Follow up Call-  Call back number 05/17/2012  Post procedure Call Back phone  # [phone]  Permission to leave phone message Yes     Patient questions:  Do you have a fever, pain , or abdominal swelling? no Pain Score  0 *  Have you tolerated food without any problems? yes  Have you been able to return to your normal activities? yes  Do you have any questions about your discharge instructions: Diet   no Medications  no Follow up visit  no  Do you have questions or concerns about your Care? no  Actions: * If pain score is 4 or above: No action needed, pain <4.

## 2012-05-21 ENCOUNTER — Encounter: Payer: Self-pay | Admitting: Family Medicine

## 2012-05-21 ENCOUNTER — Ambulatory Visit (INDEPENDENT_AMBULATORY_CARE_PROVIDER_SITE_OTHER): Payer: Medicare Other | Admitting: Family Medicine

## 2012-05-21 VITALS — BP 120/72 | HR 93 | Temp 99.0°F | Ht 59.0 in | Wt 164.8 lb

## 2012-05-21 DIAGNOSIS — K219 Gastro-esophageal reflux disease without esophagitis: Secondary | ICD-10-CM

## 2012-05-21 DIAGNOSIS — R7309 Other abnormal glucose: Secondary | ICD-10-CM

## 2012-05-21 DIAGNOSIS — E7800 Pure hypercholesterolemia, unspecified: Secondary | ICD-10-CM

## 2012-05-21 DIAGNOSIS — B0229 Other postherpetic nervous system involvement: Secondary | ICD-10-CM

## 2012-05-21 DIAGNOSIS — R7303 Prediabetes: Secondary | ICD-10-CM

## 2012-05-21 NOTE — Patient Instructions (Addendum)
Work on exercise ( 3-5 times a week), weight loss, healthy eating habits.  Work aggressively on low carb diet. Avoid juice, sweetened beverages. Avoid sweets, limit pasta, potatos, bread.  Schedule medicare wellness in 6 months with fasting labs prior.

## 2012-05-21 NOTE — Progress Notes (Signed)
  Subjective:    Patient ID: Katie Zimmerman, female    DOB: 04-23-1946, 66 y.o.   MRN: [id number]  HPI  66 year old female presents for 6 month follow up .   Elevated Cholesterol: At goal LDL not on any medication. Down from 158. Lab Results  Component Value Date   CHOL 187 05/18/2012   HDL 44.90 05/18/2012   LDLCALC 124* 05/18/2012   LDLDIRECT 158.7 10/27/2011   TRIG 93.0 05/18/2012   CHOLHDL 4 05/18/2012    Using medications without problems: Muscle aches:  Diet compliance: moderate Exercise: Minimal exercise. Other complaints:  Prediabetes: She is following this and is aware.  Husband is diabetic.  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  No tamoxifen x 5 years. Just started 1 week ago. Dr. Roselind Messier radiation onc.  Dr. Welton Flakes is oncologist. Reviewed last OV 12/10.  Dr. Kandis Mannan in surgeon.  GERD: Stable control, rarely symptomatic.  Post herpetic neuralgia: Pain is fairly well controlled on gabapentin and amitryptiline. Not using any additional pain med.    Review of Systems  Constitutional: Negative for fever and fatigue.  HENT: Negative for ear pain.   Eyes: Negative for pain.  Respiratory: Negative for chest tightness and shortness of breath.   Cardiovascular: Negative for chest pain, palpitations and leg swelling.  Gastrointestinal: Negative for abdominal pain.  Genitourinary: Negative for dysuria.       Objective:   Physical Exam  Constitutional: Vital signs are normal. She appears well-developed and well-nourished. She is cooperative.  Non-toxic appearance. She does not appear ill. No distress.  HENT:  Head: Normocephalic.  Right Ear: Hearing, tympanic membrane, external ear and ear canal normal. Tympanic membrane is not erythematous, not retracted and not bulging.  Left Ear: Hearing, tympanic membrane, external ear and ear canal normal. Tympanic membrane is not erythematous, not retracted and not bulging.  Nose: No mucosal edema or rhinorrhea.  Right sinus exhibits no maxillary sinus tenderness and no frontal sinus tenderness. Left sinus exhibits no maxillary sinus tenderness and no frontal sinus tenderness.  Mouth/Throat: Uvula is midline, oropharynx is clear and moist and mucous membranes are normal.  Eyes: Conjunctivae normal, EOM and lids are normal. Pupils are equal, round, and reactive to light. No foreign bodies found.  Neck: Trachea normal and normal range of motion. Neck supple. Carotid bruit is not present. No mass and no thyromegaly present.  Cardiovascular: Normal rate, regular rhythm, S1 normal, S2 normal, normal heart sounds, intact distal pulses and normal pulses.  Exam reveals no gallop and no friction rub.   No murmur heard. Pulmonary/Chest: Effort normal and breath sounds normal. Not tachypneic. No respiratory distress. She has no decreased breath sounds. She has no wheezes. She has no rhonchi. She has no rales.  Abdominal: Soft. Normal appearance and bowel sounds are normal. There is no tenderness.  Neurological: She is alert.  Skin: Skin is warm, dry and intact. No rash noted.  Psychiatric: Her speech is normal and behavior is normal. Judgment and thought content normal. Her mood appears not anxious. Cognition and memory are normal. She does not exhibit a depressed mood.          Assessment & Plan:

## 2012-05-22 ENCOUNTER — Encounter: Payer: Self-pay | Admitting: Radiation Oncology

## 2012-05-22 DIAGNOSIS — Z923 Personal history of irradiation: Secondary | ICD-10-CM | POA: Insufficient documentation

## 2012-05-22 DIAGNOSIS — T4145XA Adverse effect of unspecified anesthetic, initial encounter: Secondary | ICD-10-CM | POA: Insufficient documentation

## 2012-05-22 DIAGNOSIS — T148XXA Other injury of unspecified body region, initial encounter: Secondary | ICD-10-CM | POA: Insufficient documentation

## 2012-05-22 DIAGNOSIS — K219 Gastro-esophageal reflux disease without esophagitis: Secondary | ICD-10-CM | POA: Insufficient documentation

## 2012-05-24 ENCOUNTER — Ambulatory Visit
Admission: RE | Admit: 2012-05-24 | Discharge: 2012-05-24 | Disposition: A | Discharge status: Home / Self Care | Payer: Medicare Other | Source: Ambulatory Visit | Attending: Radiation Oncology | Admitting: Radiation Oncology

## 2012-05-24 ENCOUNTER — Encounter: Payer: Self-pay | Admitting: Radiation Oncology

## 2012-05-24 VITALS — BP 148/85 | HR 82 | Temp 98.4°F | Wt 167.5 lb

## 2012-05-24 DIAGNOSIS — D0510 Intraductal carcinoma in situ of unspecified breast: Secondary | ICD-10-CM

## 2012-05-24 NOTE — Progress Notes (Signed)
  Radiation Oncology         [phone] ________________________________  Name: Katie Zimmerman MRN: [id number]  Date: 05/24/2012  DOB: 1945-10-07  Follow-Up Visit Note  CC: [name], MD  [name], MD  Diagnosis:   Intraductal carcinoma the right breast  Interval Since Last Radiation:  One month   Narrative:  The patient returns today for routine follow-up.  She is doing well other than some mild soreness within the breast area. She denies any nipple discharge or bleeding. She did begin tamoxifen and seems to be tolerating this medication well thus far.  She denies any problems with swelling in her right arm or hand.                              ALLERGIES:  is allergic to penicillins and sulfonamide derivatives.  Meds: Current Outpatient Prescriptions  Medication Sig Dispense Refill  . amitriptyline (ELAVIL) 25 MG tablet Increase to 2 tabs daily, may increase further to 75-100 mg daily if no side effects but not reached maximal benefit.  60 tablet  5  . Calcium Carbonate-Vit D-Min (CALTRATE 600+D PLUS MINERALS) 600-800 MG-UNIT TABS Take 2 tablets by mouth 2 (two) times daily.      Marland Kitchen gabapentin (NEURONTIN) 300 MG capsule Take 2 capsules by mouth three times a day  180 capsule  3  . oxyCODONE-acetaminophen (PERCOCET/ROXICET) 5-325 MG per tablet       . tamoxifen (NOLVADEX) 20 MG tablet Take 1 tablet (20 mg total) by mouth daily.  90 tablet  1    Physical Findings: The patient is in no acute distress. Patient is alert and oriented.  weight is 167 lb 8 oz (75.978 kg). Her temperature is 98.4 F (36.9 C). Her blood pressure is 148/85 and her pulse is 82. Marland Kitchen  No palpable supraclavicular or axillary adenopathy.  The lungs are clear to auscultation. The heart has regular rhythm and rate. Examination of the left breast reveals no mass or nipple discharge.  Examination right breast reveals some hyperpigmentation changes and some peeling in progress. There is no dominant mass appreciated  breast nipple discharge or bleeding.  Lab Findings: Lab Results  Component Value Date   WBC 6.3 05/08/2012   HGB 13.8 05/08/2012   HCT 41.9 05/08/2012   MCV 90.8 05/08/2012   PLT 287 05/08/2012    @LASTCHEM @  Radiographic Findings: No results found.  Impression:  The patient is recovering from the effects of radiation.  No evidence for recurrence on clinical exam today  Plan:  Routine followup in 6 months  _____________________________________  -----------------------------------  Billie Lade, PhD, MD

## 2012-05-24 NOTE — Progress Notes (Signed)
Patient for routine one month follow up completion of right breast radiation November 2013.Mild discoloration  of breast without peeling.Mild fatigue.Denies pain.

## 2012-05-25 ENCOUNTER — Ambulatory Visit: Payer: Medicare Other | Admitting: Family Medicine

## 2012-05-25 ENCOUNTER — Encounter: Payer: Self-pay | Admitting: Internal Medicine

## 2012-05-25 DIAGNOSIS — Z860100 Personal history of colon polyps, unspecified: Secondary | ICD-10-CM

## 2012-05-25 HISTORY — DX: Personal history of colon polyps, unspecified: Z86.0100

## 2012-05-25 HISTORY — DX: Personal history of colonic polyps: Z86.010

## 2012-05-25 NOTE — Progress Notes (Signed)
Quick Note:  5 adenomas max 6 mm Repeat colon 04/2017 ______

## 2012-05-29 ENCOUNTER — Ambulatory Visit (INDEPENDENT_AMBULATORY_CARE_PROVIDER_SITE_OTHER): Payer: Medicare Other

## 2012-05-29 DIAGNOSIS — Z23 Encounter for immunization: Secondary | ICD-10-CM

## 2012-06-20 NOTE — Assessment & Plan Note (Signed)
Encouraged exercise, weight loss, healthy eating habits. ? ?

## 2012-06-20 NOTE — Assessment & Plan Note (Signed)
Stable control. 

## 2012-06-20 NOTE — Assessment & Plan Note (Signed)
At goal on current regimen. 

## 2012-06-28 ENCOUNTER — Telehealth: Payer: Self-pay

## 2012-06-28 MED ORDER — AMITRIPTYLINE HCL 25 MG PO TABS: 25.0000 mg | ORAL_TABLET | Freq: Every day | ORAL | Status: DC

## 2012-06-28 NOTE — Telephone Encounter (Signed)
Pt said she is on [facility] now and needs to change pharmacy from [place] to [facility] in her chart. Done. Pt also said [employer] said amitryptylline is tier 3 and will cost pt $ 70.00. Pt wants to know if Amitryptylline could be changed to paroxetine, sertraline or  Venlafaxine which would be less expensive. Pt said will need prior auth for amitryptylline if not changed to one of substitute medicine.Please advise.[facility].

## 2012-06-28 NOTE — Telephone Encounter (Signed)
Patient to get medication at [facility] on 4 dollar list

## 2012-06-28 NOTE — Telephone Encounter (Signed)
Notify pt that she is on amitryptiline not for depression but for post herpetic neuralgia. Those other med help depression but not PHN. We can compelte prior authorization if she would like, but she may start by telling her insurance the reason she is on this is not depression.

## 2012-06-28 NOTE — Telephone Encounter (Signed)
Left message for patient to return call.

## 2012-06-29 ENCOUNTER — Encounter (INDEPENDENT_AMBULATORY_CARE_PROVIDER_SITE_OTHER): Payer: Medicare Other | Admitting: General Surgery

## 2012-07-20 ENCOUNTER — Ambulatory Visit (INDEPENDENT_AMBULATORY_CARE_PROVIDER_SITE_OTHER): Payer: Medicare Other | Admitting: General Surgery

## 2012-07-20 ENCOUNTER — Encounter (INDEPENDENT_AMBULATORY_CARE_PROVIDER_SITE_OTHER): Payer: Self-pay | Admitting: General Surgery

## 2012-07-20 VITALS — BP 124/76 | HR 72 | Temp 98.1°F | Resp 16 | Ht 59.0 in | Wt 162.0 lb

## 2012-07-20 DIAGNOSIS — Z853 Personal history of malignant neoplasm of breast: Secondary | ICD-10-CM

## 2012-07-20 NOTE — Patient Instructions (Signed)

## 2012-07-23 ENCOUNTER — Telehealth: Payer: Self-pay | Admitting: Genetic Counselor

## 2012-07-23 ENCOUNTER — Telehealth: Payer: Self-pay | Admitting: *Deleted

## 2012-07-23 NOTE — Telephone Encounter (Signed)
LVOM for pt to return call in re genetic appt.  °

## 2012-07-23 NOTE — Telephone Encounter (Signed)
Left message for pt to return my call so I can schedule a genetic appt.  

## 2012-07-24 NOTE — Progress Notes (Signed)
Subjective:     Patient ID: Katie Zimmerman, female   DOB: Oct 17, 1945, 67 y.o.   MRN: [id number]  HPI This is a 67 year old female who underwent a right lumpectomy and sentinel node biopsy for right-sided ductal carcinoma in situ. This was followed by radiation therapy she is now on tamoxifen. She is having some hot flashes from the tamoxifen but is otherwise doing very well. She is due for a mammogram in July. She is doing her own exams. She has no real complaints referable to her breasts at this point. Additionally her sister was diagnosed with breast cancer at the end of last year.  Review of Systems     Objective:   Physical Exam  Constitutional: She appears well-developed and well-nourished.  Neck: Neck supple.  Pulmonary/Chest: Right breast exhibits no inverted nipple, no mass, no nipple discharge, no skin change and no tenderness. Left breast exhibits no mass, no nipple discharge, no skin change and no tenderness. Breasts are symmetrical.  Lymphadenopathy:    She has no cervical adenopathy.    She has no axillary adenopathy.       Right: No supraclavicular adenopathy present.       Left: No supraclavicular adenopathy present.   Well healed breast/axllary incisions     Assessment:     Stage 0 breast cancer Family and personal history breast cancer     Plan:     She has no clinical evidence of recurrence. She's going to continue her own exams and will get her mammogram in July as scheduled. I have referred her to to genetics due to her own personal history as well as a sister who had breast cancer around the same age.

## 2012-07-25 ENCOUNTER — Telehealth: Payer: Self-pay | Admitting: Genetic Counselor

## 2012-07-25 ENCOUNTER — Telehealth (INDEPENDENT_AMBULATORY_CARE_PROVIDER_SITE_OTHER): Payer: Self-pay

## 2012-07-25 ENCOUNTER — Telehealth: Payer: Self-pay | Admitting: *Deleted

## 2012-07-25 NOTE — Telephone Encounter (Signed)
Left message for pt to return my call so I can schedule a genetic appt.  

## 2012-07-25 NOTE — Telephone Encounter (Signed)
S/W pt in re genetic appt on 05/01 @ 11 w/Karen Lowell Guitar.

## 2012-07-25 NOTE — Telephone Encounter (Signed)
LMOM for Tiffany to call me back so I can get pt scheduled for a genetic counselor appt.

## 2012-07-25 NOTE — Telephone Encounter (Signed)
Appt made with Genetics for 09/27/12 @11 :00.

## 2012-08-13 ENCOUNTER — Telehealth: Payer: Self-pay | Admitting: *Deleted

## 2012-08-13 ENCOUNTER — Other Ambulatory Visit (HOSPITAL_BASED_OUTPATIENT_CLINIC_OR_DEPARTMENT_OTHER): Payer: Medicare Other | Admitting: Lab

## 2012-08-13 ENCOUNTER — Ambulatory Visit (HOSPITAL_BASED_OUTPATIENT_CLINIC_OR_DEPARTMENT_OTHER): Payer: Medicare Other | Admitting: Oncology

## 2012-08-13 ENCOUNTER — Encounter: Payer: Self-pay | Admitting: Oncology

## 2012-08-13 VITALS — BP 141/88 | HR 82 | Temp 98.7°F | Resp 20 | Ht 59.0 in | Wt 164.2 lb

## 2012-08-13 DIAGNOSIS — D0590 Unspecified type of carcinoma in situ of unspecified breast: Secondary | ICD-10-CM

## 2012-08-13 DIAGNOSIS — Z170 Estrogen receptor positive status [ER+]: Secondary | ICD-10-CM

## 2012-08-13 DIAGNOSIS — D0510 Intraductal carcinoma in situ of unspecified breast: Secondary | ICD-10-CM

## 2012-08-13 LAB — CBC WITH DIFFERENTIAL/PLATELET
BASO%: 1.1 % (ref 0.0–2.0)
Basophils Absolute: 0.1 10*3/uL (ref 0.0–0.1)
EOS%: 2.6 % (ref 0.0–7.0)
Eosinophils Absolute: 0.2 10*3/uL (ref 0.0–0.5)
HCT: 39.7 % (ref 34.8–46.6)
HGB: 13.5 g/dL (ref 11.6–15.9)
LYMPH%: 18.3 % (ref 14.0–49.7)
MCH: 30.3 pg (ref 25.1–34.0)
MCHC: 34 g/dL (ref 31.5–36.0)
MCV: 89.2 fL (ref 79.5–101.0)
MONO#: 0.4 10*3/uL (ref 0.1–0.9)
MONO%: 7.1 % (ref 0.0–14.0)
NEUT#: 4.1 10*3/uL (ref 1.5–6.5)
NEUT%: 70.9 % (ref 38.4–76.8)
Platelets: 247 10*3/uL (ref 145–400)
RBC: 4.45 10*6/uL (ref 3.70–5.45)
RDW: 12.8 % (ref 11.2–14.5)
WBC: 5.8 10*3/uL (ref 3.9–10.3)
lymph#: 1.1 10*3/uL (ref 0.9–3.3)

## 2012-08-13 LAB — COMPREHENSIVE METABOLIC PANEL (CC13)
ALT: 16 U/L (ref 0–55)
AST: 12 U/L (ref 5–34)
Albumin: 3.3 g/dL — ABNORMAL LOW (ref 3.5–5.0)
Alkaline Phosphatase: 67 U/L (ref 40–150)
BUN: 10.2 mg/dL (ref 7.0–26.0)
CO2: 22 mEq/L (ref 22–29)
Calcium: 9.1 mg/dL (ref 8.4–10.4)
Chloride: 111 mEq/L — ABNORMAL HIGH (ref 98–107)
Creatinine: 0.9 mg/dL (ref 0.6–1.1)
Glucose: 118 mg/dl — ABNORMAL HIGH (ref 70–99)
Potassium: 3.8 mEq/L (ref 3.5–5.1)
Sodium: 143 mEq/L (ref 136–145)
Total Bilirubin: 0.27 mg/dL (ref 0.20–1.20)
Total Protein: 6.8 g/dL (ref 6.4–8.3)

## 2012-08-13 MED ORDER — TAMOXIFEN CITRATE 20 MG PO TABS: 20.0000 mg | ORAL_TABLET | Freq: Every day | ORAL | Status: DC

## 2012-08-13 NOTE — Progress Notes (Signed)
OFFICE PROGRESS NOTE  CC  Katie Nora, MD [address] [place] [address]  DIAGNOSIS: 67 year old female with DCIS of the right breast status post lumpectomy  PRIOR THERAPY:  #1 assure originally underwent a screening mammogram that showed a right-sided abnormality. She had an ultrasound of the mass performed that showed a 9 x 8 x 5 mm area of concern. Needle core biopsy showed a ER-positive DCIS. Was low grade. Tumor was ER +100% PR positive. She went on to have a lumpectomy with sentinel lymph node biopsy. The lumpectomy final path showed a 1.0 cm low grade DCIS with lobular neoplasia all surgical margins were -3 sentinel nodes were negative for metastatic disease.  #2 patient then went on to receive adjuvant radiation therapy. She completed this few weeks ago. Overall she tolerated it well without any significant problems.  #3 she is on  tamoxifen 20 mg daily adjuvantly as a chemopreventive. Total of 5 years of therapy is planned.  CURRENT THERAPY: Tamoxifen 20 mg daily starting 05/08/2012  INTERVAL HISTORY: Katie Zimmerman 67 y.o. female returns for followup visit. . Overall she did well without any significant complaints she denies any fevers chills night sweats headaches shortness of breath chest pains palpitations no myalgias and arthralgias. Remainder of the 10 point review of systems is negative.  MEDICAL HISTORY: Past Medical History  Diagnosis Date  . GERD (gastroesophageal reflux disease)   . Osteoporosis   . Nerve damage 2000    Right side of face after Shingles  . Complication of anesthesia     hard to wake up age 8-not since  . Cancer 12/2011    breast  . History of radiation therapy 02/29/12 -04/21/12    right breast  . Personal history of colonic polyps-adenomas 05/25/2012    ALLERGIES:  is allergic to penicillins and sulfonamide derivatives.  MEDICATIONS:  Current Outpatient Prescriptions  Medication Sig Dispense Refill  .  amitriptyline (ELAVIL) 25 MG tablet Take 1 tablet (25 mg total) by mouth at bedtime. Increase to 2 tabs daily, may increase further to 75-100 mg daily if no side effects but not reached maximal benefit.  90 tablet  3  . Calcium Carbonate-Vit D-Min (CALTRATE 600+D PLUS MINERALS) 600-800 MG-UNIT TABS Take 2 tablets by mouth 2 (two) times daily.      Marland Kitchen gabapentin (NEURONTIN) 300 MG capsule Take 2 capsules by mouth three times a day  180 capsule  3  . tamoxifen (NOLVADEX) 20 MG tablet Take 1 tablet (20 mg total) by mouth daily.  90 tablet  1   No current facility-administered medications for this visit.    SURGICAL HISTORY:  Past Surgical History  Procedure Laterality Date  . Ovarian cyst surgery  1964  . Tubal ligation  1976  . Foot surgery  1996    rt/lt great toes  . Glaucoma surgery  1993    laser  . Appendectomy    . Biopsy breast  01/10/12    Right breast needle cor biopsy  . Breast lumpectomy  01/26/2012    Right/ERPR+ DCIS, right ax snbx    REVIEW OF SYSTEMS:  A comprehensive review of systems was negative.   HEALTH MAINTENANCE:  PHYSICAL EXAMINATION: Blood pressure 141/88, pulse 82, temperature 98.7 F (37.1 C), temperature source Oral, resp. rate 20, height 4\' 11"  (1.499 m), weight 164 lb 3.2 oz (74.481 kg). Body mass index is 33.15 kg/(m^2). ECOG PERFORMANCE STATUS: 0 - Asymptomatic   General appearance:  alert, cooperative and appears stated age Lymph nodes: Cervical, supraclavicular, and axillary nodes normal. Resp: clear to auscultation bilaterally Back: symmetric, no curvature. ROM normal. No CVA tenderness. Cardio: regular rate and rhythm GI: soft, non-tender; bowel sounds normal; no masses,  no organomegaly Extremities: extremities normal, atraumatic, no cyanosis or edema Neurologic: Grossly normal  Right breast reveals well-healed right lumpectomy scar and sentinel lymph node scars. There is some darkening of the skin due to radiation otherwise breast exam is  normal LABORATORY DATA: Lab Results  Component Value Date   WBC 5.8 08/13/2012   HGB 13.5 08/13/2012   HCT 39.7 08/13/2012   MCV 89.2 08/13/2012   PLT 247 08/13/2012      Chemistry      Component Value Date/Time   NA 139 05/18/2012 0850   NA 143 05/08/2012 1016   K 3.7 05/18/2012 0850   K 4.4 05/08/2012 1016   CL 111 05/18/2012 0850   CL 107 05/08/2012 1016   CO2 20 05/18/2012 0850   CO2 26 05/08/2012 1016   BUN 17 05/18/2012 0850   BUN 19.0 05/08/2012 1016   CREATININE 0.9 05/18/2012 0850   CREATININE 0.9 05/08/2012 1016      Component Value Date/Time   CALCIUM 9.0 05/18/2012 0850   CALCIUM 9.5 05/08/2012 1016   ALKPHOS 83 05/18/2012 0850   ALKPHOS 106 05/08/2012 1016   AST 16 05/18/2012 0850   AST 16 05/08/2012 1016   ALT 19 05/18/2012 0850   ALT 20 05/08/2012 1016   BILITOT 0.4 05/18/2012 0850   BILITOT 0.38 05/08/2012 1016    Diagnosis 1. Breast, lumpectomy, Right - DUCTAL CARCINOMA IN SITU, LOW GRADE, SPANNING 1.0 CM. - LOBULAR NEOPLASIA (ATYPICAL LOBULAR HYPERPLASIA). - THE SURGICAL RESECTION MARGINS ARE NEGATIVE FOR DUCTAL CARCINOMA. - SEE ONCOLOGY TABLE BELOW. 2. Lymph node, sentinel, biopsy, Right axillary - THERE IS NO EVIDENCE OF CARCINOMA IN 1 OF 1 LYMPH NODE (0/1). 3. Lymph node, sentinel, biopsy, Right axillary - THERE IS NO EVIDENCE OF CARCINOMA IN 1 OF 1 LYMPH NODE (0/1). - SEE COMMENT. 4. Lymph node, sentinel, biopsy, Right axillary - THERE IS NO EVIDENCE OF CARCINOMA IN 1 OF 1 LYMPH NODE (0/1). Microscopic Comment 1. BREAST, IN SITU CARCINOMA Specimen, including laterality: Right breast Procedure: Needle localized lumpectomy Grade of carcinoma: Low grade Necrosis: Not identified Estimated tumor size: (gross measurement): 1.0 cm Treatment effect: N/A Distance to closest margin: 0.5 cm to posterior margin (gross measurement) Breast prognostic profile: [phone] Estrogen receptor: 100%, strong staining intensity Progesterone receptor:  100%, strong staining intensity Lymph nodes: Examined: 3 Lymph nodes with metastasis: 0 TNM: pTis, pN0 Comments: The carcinoma cells are negative for cytokeratin 5/6. P63, smooth muscle myosin and calponin stains 1 of 3 Duplicate copy   RADIOGRAPHIC STUDIES:  No results found.  ASSESSMENT: 67 year old female with  #1 low grade DCIS measuring 1.0 cm status post lumpectomy in August 2013. Final pathology revealed that ER positive PR positive DCIS 3 sentinel nodes negative for disease. She is now status post adjuvant radiation therapy completed in November 2013.  #2 she is a good candidate for chemoprevention with tamoxifen 20 mg daily for her DCIS. Risks and benefits of tamoxifen have been discussed with her.   PLAN:   #1 patient will continue with tamoxifen 20 mg daily.  #2 she is recommended to see her gynecologist ophthalmologist on a yearly basis. If she develops any side effects from tamoxifen she is to call me immediately.  #3 I will plan on  seeing her back in 6 months time or sooner if need arises.   All questions were answered. The patient knows to call the clinic with any problems, questions or concerns. We can certainly see the patient much sooner if necessary.  I spent 25 minutes counseling the patient face to face. The total time spent in the appointment was 30 minutes.    Drue Second, MD Medical/Oncology [facility] [phone] (beeper) [phone] (Office)  08/13/2012, 12:31 PM

## 2012-08-13 NOTE — Telephone Encounter (Signed)
appts made and printed 

## 2012-08-13 NOTE — Patient Instructions (Addendum)
conrinue tamoxifen 20 mg daily  I will see you back in 6 months

## 2012-08-14 LAB — VITAMIN D 25 HYDROXY (VIT D DEFICIENCY, FRACTURES): Vit D, 25-Hydroxy: 37 ng/mL (ref 30–89)

## 2012-09-05 ENCOUNTER — Other Ambulatory Visit: Payer: Self-pay | Admitting: Family Medicine

## 2012-09-06 ENCOUNTER — Other Ambulatory Visit: Payer: Self-pay | Admitting: Family Medicine

## 2012-09-07 ENCOUNTER — Other Ambulatory Visit: Payer: Self-pay | Admitting: Family Medicine

## 2012-09-11 ENCOUNTER — Other Ambulatory Visit: Payer: Self-pay | Admitting: Family Medicine

## 2012-09-27 ENCOUNTER — Other Ambulatory Visit: Payer: Medicare Other | Admitting: Lab

## 2012-09-27 ENCOUNTER — Encounter: Payer: Medicare Other | Admitting: Genetic Counselor

## 2012-11-05 ENCOUNTER — Other Ambulatory Visit: Payer: Self-pay | Admitting: *Deleted

## 2012-11-05 DIAGNOSIS — C50919 Malignant neoplasm of unspecified site of unspecified female breast: Secondary | ICD-10-CM

## 2012-11-05 MED ORDER — TAMOXIFEN CITRATE 20 MG PO TABS: 20.0000 mg | ORAL_TABLET | Freq: Every day | ORAL | Status: DC

## 2012-11-07 ENCOUNTER — Encounter: Payer: Self-pay | Admitting: Radiation Oncology

## 2012-11-08 ENCOUNTER — Encounter: Payer: Self-pay | Admitting: Radiation Oncology

## 2012-11-08 ENCOUNTER — Ambulatory Visit
Admission: RE | Admit: 2012-11-08 | Discharge: 2012-11-08 | Disposition: A | Discharge status: Home / Self Care | Payer: Medicare Other | Source: Ambulatory Visit | Attending: Radiation Oncology | Admitting: Radiation Oncology

## 2012-11-08 VITALS — BP 145/87 | HR 82 | Temp 97.8°F | Resp 20 | Wt 164.4 lb

## 2012-11-08 DIAGNOSIS — D0511 Intraductal carcinoma in situ of right breast: Secondary | ICD-10-CM

## 2012-11-08 NOTE — Progress Notes (Signed)
  Radiation Oncology         [phone] ________________________________  Name: Katie Zimmerman MRN: [id number]  Date: 11/08/2012  DOB: Zimmerman 26, 1947  Follow-Up Visit Note  CC: Katie Nora, MD  Katie December, MD  Diagnosis:   Intraductal carcinoma the right breast  Interval Since Last Radiation:  7 months  Narrative:  The patient returns today for routine follow-up.  She seems to be doing reasonably well. She does have some hot flashes related to tamoxifen.  She denies any nipple discharge or bleeding. She does he have some numbness in parts of the breast and some increased sensitivity in other parts of the breast. She is due for mammograms in August. She will followup with surgery in September.                              ALLERGIES:  is allergic to penicillins and sulfonamide derivatives.  Meds: Current Outpatient Prescriptions  Medication Sig Dispense Refill  . amitriptyline (ELAVIL) 25 MG tablet Take 1 tablet (25 mg total) by mouth at bedtime. Increase to 2 tabs daily, may increase further to 75-100 mg daily if no side effects but not reached maximal benefit.  90 tablet  3  . Calcium Carbonate-Vit D-Min (CALTRATE 600+D PLUS MINERALS) 600-800 MG-UNIT TABS Take 2 tablets by mouth 2 (two) times daily.      Marland Kitchen gabapentin (NEURONTIN) 300 MG capsule TAKE 2 CAPSULES BY MOUTH 3 TIMES A DAY  180 capsule  1  . tamoxifen (NOLVADEX) 20 MG tablet Take 1 tablet (20 mg total) by mouth daily.  90 tablet  3   No current facility-administered medications for this encounter.    Physical Findings: The patient is in no acute distress. Patient is alert and oriented.  weight is 164 lb 6.4 oz (74.571 kg). Her oral temperature is 97.8 F (36.6 C). Her blood pressure is 145/87 and her pulse is 82. Her respiration is 20. Marland Kitchen  No palpable supraclavicular or axillary adenopathy. The lungs are clear to auscultation. The heart has regular rhythm and rate. Examination of the left breast reveals no mass or nipple  discharge. Examination of the right breast area reveals some mild edema. the patient has some mild hyperpigmentation changes in the breast area. There is no dominant mass appreciated breast,  nipple discharge or bleeding.  Lab Findings: Lab Results  Component Value Date   WBC 5.8 08/13/2012   HGB 13.5 08/13/2012   HCT 39.7 08/13/2012   MCV 89.2 08/13/2012   PLT 247 08/13/2012     Radiographic Findings: No results found.  Impression: No evidence of recurrence on clinical exam today.  Plan:  When necessary followup in radiation oncology. Patient will continue close followup in medical oncology.  _____________________________________  -----------------------------------  Billie Lade, PhD, MD

## 2012-11-08 NOTE — Progress Notes (Signed)
Pt states some of the numbness of her underside upper right arm and axilla has resolved, but she has tenderness, soreness in this area and in her right breast. She states she is still applying lotion to this area. Pt taking Tamoxifen. She denies fatigue, loss of appetite. She is currently caring for her great grand daughter. Pt's last mammogram Aug 2013, none scheduled at this time.

## 2012-11-12 ENCOUNTER — Other Ambulatory Visit (HOSPITAL_BASED_OUTPATIENT_CLINIC_OR_DEPARTMENT_OTHER): Payer: Medicare Other | Admitting: Lab

## 2012-11-12 ENCOUNTER — Ambulatory Visit (HOSPITAL_BASED_OUTPATIENT_CLINIC_OR_DEPARTMENT_OTHER): Payer: Medicare Other | Admitting: Genetic Counselor

## 2012-11-12 ENCOUNTER — Encounter: Payer: Self-pay | Admitting: Genetic Counselor

## 2012-11-12 DIAGNOSIS — Z86010 Personal history of colonic polyps: Secondary | ICD-10-CM

## 2012-11-12 DIAGNOSIS — D0590 Unspecified type of carcinoma in situ of unspecified breast: Secondary | ICD-10-CM

## 2012-11-12 DIAGNOSIS — D0511 Intraductal carcinoma in situ of right breast: Secondary | ICD-10-CM

## 2012-11-12 DIAGNOSIS — Z803 Family history of malignant neoplasm of breast: Secondary | ICD-10-CM

## 2012-11-12 DIAGNOSIS — D0510 Intraductal carcinoma in situ of unspecified breast: Secondary | ICD-10-CM

## 2012-11-12 LAB — CBC WITH DIFFERENTIAL/PLATELET
BASO%: 1.1 % (ref 0.0–2.0)
Basophils Absolute: 0.1 10*3/uL (ref 0.0–0.1)
EOS%: 4 % (ref 0.0–7.0)
Eosinophils Absolute: 0.2 10*3/uL (ref 0.0–0.5)
HCT: 38 % (ref 34.8–46.6)
HGB: 13.1 g/dL (ref 11.6–15.9)
LYMPH%: 24.3 % (ref 14.0–49.7)
MCH: 30.9 pg (ref 25.1–34.0)
MCHC: 34.3 g/dL (ref 31.5–36.0)
MCV: 90.1 fL (ref 79.5–101.0)
MONO#: 0.4 10*3/uL (ref 0.1–0.9)
MONO%: 6 % (ref 0.0–14.0)
NEUT#: 3.9 10*3/uL (ref 1.5–6.5)
NEUT%: 64.6 % (ref 38.4–76.8)
Platelets: 285 10*3/uL (ref 145–400)
RBC: 4.22 10*6/uL (ref 3.70–5.45)
RDW: 12.4 % (ref 11.2–14.5)
WBC: 6.1 10*3/uL (ref 3.9–10.3)
lymph#: 1.5 10*3/uL (ref 0.9–3.3)

## 2012-11-12 LAB — COMPREHENSIVE METABOLIC PANEL (CC13)
ALT: 13 U/L (ref 0–55)
AST: 14 U/L (ref 5–34)
Albumin: 3.4 g/dL — ABNORMAL LOW (ref 3.5–5.0)
Alkaline Phosphatase: 68 U/L (ref 40–150)
BUN: 11.5 mg/dL (ref 7.0–26.0)
CO2: 21 mEq/L — ABNORMAL LOW (ref 22–29)
Calcium: 9.2 mg/dL (ref 8.4–10.4)
Chloride: 110 mEq/L — ABNORMAL HIGH (ref 98–107)
Creatinine: 0.8 mg/dL (ref 0.6–1.1)
Glucose: 116 mg/dl — ABNORMAL HIGH (ref 70–99)
Potassium: 4 mEq/L (ref 3.5–5.1)
Sodium: 142 mEq/L (ref 136–145)
Total Bilirubin: 0.35 mg/dL (ref 0.20–1.20)
Total Protein: 6.8 g/dL (ref 6.4–8.3)

## 2012-11-12 NOTE — Progress Notes (Signed)
Dr.  Emelia Loron requested a consultation for genetic counseling and risk assessment for Katie Zimmerman, a 67 y.o. female, for discussion of her personal history of breast cancer and family history of breast cancer.  She presents to clinic today to discuss the possibility of a genetic predisposition to cancer, and to further clarify her risks, as well as her family members' risks for cancer.   HISTORY OF PRESENT ILLNESS: In 12/2011, at the age of 26, Katie Zimmerman was diagnosed with DCIS of the right brest. This was treated with lumpectomy and radiation.  The tumor was ER+/PR+.  She has had a colonoscopy and was found to have 5 tubular adenomas. The patient reports having an ovarian cyst when she was a teenager, but that it was benign.   Past Medical History  Diagnosis Date  . GERD (gastroesophageal reflux disease)   . Osteoporosis   . Nerve damage 2000    Right side of face after Shingles  . Complication of anesthesia     hard to wake up age 53-not since  . Cancer 12/2011    breast  . History of radiation therapy 02/29/12 -04/21/12    right breast, 5040 cGy in 28 fx, boost to total 6240 cGy  . Personal history of colonic polyps-adenomas 05/25/2012  . Breast cancer 12/2011    DCIS    Past Surgical History  Procedure Laterality Date  . Ovarian cyst surgery  1964  . Tubal ligation  1976  . Foot surgery  1996    rt/lt great toes  . Glaucoma surgery  1993    laser  . Appendectomy    . Biopsy breast  01/10/12    Right breast needle cor biopsy  . Breast lumpectomy  01/26/2012    Right/ERPR+ DCIS, right ax snbx    History   Social History  . Marital Status: Married    Spouse Name: N/A    Number of Children: 2  . Years of Education: N/A   Occupational History  . CONSULTANT    Social History Main Topics  . Smoking status: Never Smoker   . Smokeless tobacco: Never Used  . Alcohol Use: Yes     Comment: occasional glass of wine  . Drug Use: No  . Sexually Active: Not  Currently   Other Topics Concern  . None   Social History Narrative   Reviewed 09/2011   Regular exercise-yes-intermittantly at [employer]   Diet: fruits and veggies   Living will: Working on setting up, MOST form given to review and return   HCPOA:Husband    REPRODUCTIVE HISTORY AND PERSONAL RISK ASSESSMENT FACTORS: Menarche was at age 88.   postmenopausal Uterus Intact: yes Ovaries Intact: yes G2P2A0, first live birth at age 37  She has not previously undergone treatment for infertility.   Oral Contraceptive use: 0 years   She has used HRT in the past.    FAMILY HISTORY:  We obtained a detailed, 4-generation family history.  Significant diagnoses are listed below: Family History  Problem Relation Age of Onset  . Hypertension Mother   . Cancer Mother     LIVER cancer  . Hypertension Father   . Diabetes Father   . Heart failure Father   . Colon cancer Neg Hx   . Stomach cancer Neg Hx   . Breast cancer Sister 37    NOT hormone receptor breast cancer  . Breast cancer Maternal Aunt 75  . Breast cancer Other  dx in her 30s  . Breast cancer Cousin 24    maternal cousin  . Breast cancer Cousin     maternal cousin  . Breast cancer Cousin     maternal cousin  . Cancer Cousin     unknown form of cancer; maternal cousin  . Cancer Cousin     unknown form of cancer; maternal cousin  . Cancer Cousin 11    ? lung cancer; maternal cousin  . Breast cancer Cousin     dx 24s-50s; paternal cousin  . Breast cancer Cousin     dx in her 42s; paternal cousin   Patient's maternal ancestors are of [place] and [place] descent, and paternal ancestors are of [place] and [place] descent. There is no reported Ashkenazi Jewish ancestry. There is no  known consanguinity.  GENETIC COUNSELING ASSESSMENT: Katie Zimmerman is a 67 y.o. female with a personal and family history of breast cancer which somewhat suggestive of a hereditary breast cancer syndrome and predisposition to cancer. We,  therefore, discussed and recommended the following at today's visit.   DISCUSSION: We reviewed the characteristics, features and inheritance patterns of hereditary cancer syndromes. We also discussed genetic testing, including the appropriate family members to test, the process of testing, insurance coverage and turn-around-time for results. We recommended Katie Zimmerman pursue genetic testing for Breast and Ovarian Cancer Panel at [facility].   PLAN: After considering the risks, benefits, and limitations, Katie Zimmerman provided informed consent to pursue genetic testing and the blood sample will be sent to [facility] for analysis of the Breast and Ovarian Cancer Panel. We discussed the implications of a positive, negative and/ or variant of uncertain significance genetic test result. Results should be available within approximately 3-4 weeks' time, at which point they will be disclosed by telephone to Katie Zimmerman, as will any additional her recommendations warranted by these results. Katie Zimmerman will receive a summary of her genetic counseling visit and a copy of her results once available. This information will also be available in Epic. We encouraged Katie Zimmerman to remain in contact with cancer genetics annually so that we can continuously update the family history and inform her of any changes in cancer genetics and testing that may be of benefit for her family. Katie Zimmerman's questions were answered to her satisfaction today. Our contact information was provided should additional questions or concerns arise.  Per the patient's request, we will contact her by telephone to discuss these results. A follow up genetic counseling visit will be scheduled if indicated.  The patient was seen for a total of 60 minutes, greater than 50% of which was spent face-to-face counseling.  This plan is being carried out per Dr. Feliz Beam recommendations.  This note will also be sent to the referring provider via  the electronic medical record. The patient will be supplied with a summary of this genetic counseling discussion as well as educational information on the discussed hereditary cancer syndromes following the conclusion of their visit.   Patient was discussed with Dr. Drue Second.   _______________________________________________________________________ For Office Staff:  Number of people involved in session: 2 Was an Intern/ student involved with case: no

## 2012-11-13 ENCOUNTER — Telehealth: Payer: Self-pay | Admitting: Family Medicine

## 2012-11-13 ENCOUNTER — Other Ambulatory Visit: Payer: Medicare Other

## 2012-11-13 DIAGNOSIS — E7800 Pure hypercholesterolemia, unspecified: Secondary | ICD-10-CM

## 2012-11-13 NOTE — Telephone Encounter (Signed)
Message copied by Excell Seltzer on Tue Nov 13, 2012  3:07 PM ------      [name]      Created: Tue Nov 13, 2012 11:20 AM      Regarding: Lab orders for Wednesday, 6.18.14       Patient is scheduled for CPX labs, please order future labs, Thanks , Katie Zimmerman       ------

## 2012-11-14 ENCOUNTER — Other Ambulatory Visit (INDEPENDENT_AMBULATORY_CARE_PROVIDER_SITE_OTHER): Payer: Medicare Other

## 2012-11-14 DIAGNOSIS — E7800 Pure hypercholesterolemia, unspecified: Secondary | ICD-10-CM

## 2012-11-14 LAB — COMPREHENSIVE METABOLIC PANEL
ALT: 19 U/L (ref 0–35)
AST: 17 U/L (ref 0–37)
Albumin: 3.9 g/dL (ref 3.5–5.2)
Alkaline Phosphatase: 57 U/L (ref 39–117)
BUN: 13 mg/dL (ref 6–23)
CO2: 20 mEq/L (ref 19–32)
Calcium: 9.2 mg/dL (ref 8.4–10.5)
Chloride: 105 mEq/L (ref 96–112)
Creatinine, Ser: 0.9 mg/dL (ref 0.4–1.2)
GFR: 68.15 mL/min (ref >60.00)
Glucose, Bld: 128 mg/dL — ABNORMAL HIGH (ref 70–99)
Potassium: 4 mEq/L (ref 3.5–5.1)
Sodium: 142 mEq/L (ref 135–145)
Total Bilirubin: 0.6 mg/dL (ref 0.3–1.2)
Total Protein: 7.2 g/dL (ref 6.0–8.3)

## 2012-11-14 LAB — LIPID PANEL
Cholesterol: 186 mg/dL (ref 0–200)
HDL: 44.8 mg/dL (ref >39.00)
LDL Cholesterol: 111 mg/dL — ABNORMAL HIGH (ref 0–99)
Total CHOL/HDL Ratio: 4
Triglycerides: 153 mg/dL — ABNORMAL HIGH (ref 0.0–149.0)
VLDL: 30.6 mg/dL (ref 0.0–40.0)

## 2012-11-20 ENCOUNTER — Encounter: Payer: Self-pay | Admitting: Family Medicine

## 2012-11-20 ENCOUNTER — Ambulatory Visit (INDEPENDENT_AMBULATORY_CARE_PROVIDER_SITE_OTHER): Payer: Medicare Other | Admitting: Family Medicine

## 2012-11-20 VITALS — BP 120/74 | HR 77 | Temp 98.0°F | Ht 59.0 in | Wt 167.5 lb

## 2012-11-20 DIAGNOSIS — R7309 Other abnormal glucose: Secondary | ICD-10-CM

## 2012-11-20 DIAGNOSIS — R7303 Prediabetes: Secondary | ICD-10-CM

## 2012-11-20 DIAGNOSIS — Z0000 Encounter for general adult medical examination without abnormal findings: Secondary | ICD-10-CM

## 2012-11-20 DIAGNOSIS — E7800 Pure hypercholesterolemia, unspecified: Secondary | ICD-10-CM

## 2012-11-20 LAB — HEMOGLOBIN A1C: Hgb A1c MFr Bld: 6.1 % (ref 4.6–6.5)

## 2012-11-20 NOTE — Assessment & Plan Note (Signed)
Well controlled with diet. 

## 2012-11-20 NOTE — Patient Instructions (Addendum)
Call insurance to look into shingles  and tetanus vaccines coverage.  Stop by the lab on your way out for A1C.

## 2012-11-20 NOTE — Progress Notes (Signed)
Subjective:    Patient ID: Katie Zimmerman, female    DOB: 21-Jun-1945, 67 y.o.   MRN: [id number]  HPI I have personally reviewed the Medicare Annual Wellness questionnaire and have noted 1. The patient's medical and social history 2. Their use of alcohol, tobacco or illicit drugs 3. Their current medications and supplements 4. The patient's functional ability including ADL's, fall risks, home safety risks and hearing or visual             impairment. 5. Diet and physical activities 6. Evidence for depression or mood disorders The patients weight, height, BMI and visual acuity have been recorded in the chart I have made referrals, counseling and provided education to the patient based review of the above and I have provided the pt with a written personalized care plan for preventive services.  Elevated Cholesterol: At goal LDL not on any medication. Down from 158.  Lab Results  Component Value Date   CHOL 186 11/14/2012   HDL 44.80 11/14/2012   LDLCALC 111* 11/14/2012   LDLDIRECT 158.7 10/27/2011   TRIG 153.0* 11/14/2012   CHOLHDL 4 11/14/2012   Using medications without problems: None Muscle aches: None Diet compliance: moderate  Exercise: Going on walks. Other complaints:   Prediabetes: Blood sugar is elevated on labs.. Will eval with A1C.  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  Now tamoxifen x on yr 1/5  Dr. Roselind Messier radiation onc.  Dr. Welton Flakes is oncologist. Reviewed last OV 12/10.  Dr. Kandis Mannan in surgeon.   GERD: Stable control, rarely symptomatic.   Post herpetic neuralgia: Pain is fairly well controlled on gabapentin and amitryptiline. Not using any additional pain med.       Review of Systems  Constitutional: Negative for fever, fatigue and unexpected weight change.  HENT: Negative for ear pain, congestion, sore throat, sneezing, trouble swallowing and sinus pressure.   Eyes: Negative for pain and itching.  Respiratory: Negative for cough, shortness of  breath and wheezing.   Cardiovascular: Negative for chest pain, palpitations and leg swelling.  Gastrointestinal: Negative for nausea, abdominal pain, diarrhea, constipation and blood in stool.  Genitourinary: Negative for dysuria, hematuria, vaginal discharge, difficulty urinating and menstrual problem.  Skin: Negative for rash.  Neurological: Negative for syncope, weakness, light-headedness, numbness and headaches.  Psychiatric/Behavioral: Negative for confusion and dysphoric mood. The patient is not nervous/anxious.        Objective:   Physical Exam  Constitutional: Vital signs are normal. She appears well-developed and well-nourished. She is cooperative.  Non-toxic appearance. She does not appear ill. No distress.  HENT:  Head: Normocephalic.  Right Ear: Hearing, tympanic membrane, external ear and ear canal normal.  Left Ear: Hearing, tympanic membrane, external ear and ear canal normal.  Nose: Nose normal.  Eyes: Conjunctivae, EOM and lids are normal. Pupils are equal, round, and reactive to light. No foreign bodies found.  Neck: Trachea normal and normal range of motion. Neck supple. Carotid bruit is not present. No mass and no thyromegaly present.  Cardiovascular: Normal rate, regular rhythm, S1 normal, S2 normal, normal heart sounds and intact distal pulses.  Exam reveals no gallop.   No murmur heard. Pulmonary/Chest: Effort normal and breath sounds normal. No respiratory distress. She has no wheezes. She has no rhonchi. She has no rales.  Abdominal: Soft. Normal appearance and bowel sounds are normal. She exhibits no distension, no fluid wave, no abdominal bruit and no mass. There is no hepatosplenomegaly. There is no tenderness. There  is no rebound, no guarding and no CVA tenderness. No hernia.  Lymphadenopathy:    She has no cervical adenopathy.    She has no axillary adenopathy.  Neurological: She is alert. She has normal strength. No cranial nerve deficit or sensory deficit.   Skin: Skin is warm, dry and intact. No rash noted.  Psychiatric: Her speech is normal and behavior is normal. Judgment normal. Her mood appears not anxious. Cognition and memory are normal. She does not exhibit a depressed mood.          Assessment & Plan:  The patient's preventative maintenance and recommended screening tests for an annual wellness exam were reviewed in full today. Brought up to date unless services declined.  Counselled on the importance of diet, exercise, and its role in overall health and mortality. The patient's FH and SH was reviewed, including their home life, tobacco status, and drug and alcohol status.   Vaccines: Due for Td, uptodate with PNA, consider shingles coverage. Nonsmoker 04/2012 Dr. Leone Payor, colonoscopy 5 adenomas max 6 mm Repeat colon 04/2017 DVE/PAP: Dr. Greta Doom, GYN last pelvic 09/2011.Marland Kitchen She will schedule.  MAmmo: Breast cancer followed by Dr. Welton Flakes, last OV 08/2012 DEXA: osteo something? 2007, followed by GYN

## 2012-11-20 NOTE — Assessment & Plan Note (Signed)
FBS at labs 128, per pt at goal at home. Will eval with A1C.

## 2012-11-22 ENCOUNTER — Ambulatory Visit: Payer: Medicare Other | Admitting: Radiation Oncology

## 2012-12-15 ENCOUNTER — Other Ambulatory Visit: Payer: Self-pay | Admitting: Family Medicine

## 2012-12-17 NOTE — Telephone Encounter (Signed)
Received refill request electronically from pharmacy. Last office visit 11/20/12. Is it okay to refill medication?

## 2012-12-19 ENCOUNTER — Telehealth: Payer: Self-pay | Admitting: Genetic Counselor

## 2012-12-19 ENCOUNTER — Encounter: Payer: Self-pay | Admitting: Genetic Counselor

## 2012-12-19 NOTE — Telephone Encounter (Signed)
Revealed negative genetic testing but has an MSH2 VUS.  FH is not consistent with an MSH2 mutation.  Discussed this finding with patient.

## 2012-12-24 ENCOUNTER — Encounter: Payer: Self-pay | Admitting: Family Medicine

## 2012-12-24 ENCOUNTER — Ambulatory Visit (INDEPENDENT_AMBULATORY_CARE_PROVIDER_SITE_OTHER): Payer: Medicare Other | Admitting: Family Medicine

## 2012-12-24 VITALS — BP 142/92 | HR 88 | Temp 99.4°F | Wt 162.0 lb

## 2012-12-24 DIAGNOSIS — J209 Acute bronchitis, unspecified: Secondary | ICD-10-CM

## 2012-12-24 MED ORDER — AZITHROMYCIN 250 MG PO TABS: ORAL_TABLET | ORAL | Status: DC

## 2012-12-24 MED ORDER — ALBUTEROL SULFATE HFA 108 (90 BASE) MCG/ACT IN AERS: 2.0000 | INHALATION_SPRAY | Freq: Four times a day (QID) | RESPIRATORY_TRACT | Status: DC | PRN

## 2012-12-24 NOTE — Assessment & Plan Note (Signed)
Anticipate bronchitis with reactive airway exacerbation. In multiple sick contacts with persistent cough, ?pertussis. cover for atypical bronchitis with zpack and use albuterol inhaler for wheeze/dyspnea PRN. Update if not imrpoving as expected, discussed reasons to return for further care. rec salt water gargles for tonsillolith.

## 2012-12-24 NOTE — Progress Notes (Signed)
  Subjective:    Patient ID: Katie Zimmerman, female    DOB: [birth year], 67 y.o.   MRN: [id number]  HPI CC: productive cough  2d h/o productive cough.  Staying hoarse.  + fever, low grade to 100.  Mild diarrhea yesterday.  +HA, ST, nasal congestion and sinus drainage.  No abd pain, nausea/vomiting, ear or tooth pain.  So far has tried day and night cold and flu medicine OTC which helped some.  Also taking tylenol.  No smokers at home. No h/o asthma, COPD. + h/o persistent PNA 1995.  Dx with asthmatic bronchitis. H/o breast cancer 2013.  Husband sick with cold sxs as well - resolving, recent PNA. Granddaughter  and great granddaughter also sick.  Past Medical History  Diagnosis Date  . GERD (gastroesophageal reflux disease)   . Osteoporosis   . Nerve damage 2000    Right side of face after Shingles  . Complication of anesthesia     hard to wake up age 47-not since  . Cancer 12/2011    breast  . History of radiation therapy 02/29/12 -04/21/12    right breast, 5040 cGy in 28 fx, boost to total 6240 cGy  . Personal history of colonic polyps-adenomas 05/25/2012  . Breast cancer 12/2011    DCIS    Past Surgical History  Procedure Laterality Date  . Ovarian cyst surgery  1964  . Tubal ligation  1976  . Foot surgery  1996    rt/lt great toes  . Glaucoma surgery  1993    laser  . Appendectomy    . Biopsy breast  01/10/12    Right breast needle cor biopsy  . Breast lumpectomy  01/26/2012    Right/ERPR+ DCIS, right ax snbx   Review of Systems Per HPI    Objective:   Physical Exam  Nursing note and vitals reviewed. Constitutional: She appears well-developed and well-nourished. No distress.  HENT:  Head: Normocephalic and atraumatic.  Right Ear: Hearing, tympanic membrane, external ear and ear canal normal.  Left Ear: Hearing, tympanic membrane, external ear and ear canal normal.  Nose: No mucosal edema or rhinorrhea. Right sinus exhibits no maxillary sinus tenderness and no  frontal sinus tenderness. Left sinus exhibits no maxillary sinus tenderness and no frontal sinus tenderness.  Mouth/Throat: Uvula is midline and mucous membranes are normal. Posterior oropharyngeal edema and posterior oropharyngeal erythema present. No oropharyngeal exudate or tonsillar abscesses.  Tonsillolith on left  Eyes: Conjunctivae and EOM are normal. Pupils are equal, round, and reactive to light. No scleral icterus.  Neck: Normal range of motion. Neck supple.  Cardiovascular: Normal rate, regular rhythm, normal heart sounds and intact distal pulses.   No murmur heard. Pulmonary/Chest: Effort normal. No respiratory distress. She has no decreased breath sounds. She has wheezes (insp/exp). She has rhonchi (scattered). She has no rales.  Coarse breath sounds throughout.  Musculoskeletal: She exhibits no edema.  Lymphadenopathy:    She has no cervical adenopathy.  Skin: Skin is warm and dry. No rash noted.       Assessment & Plan:

## 2012-12-24 NOTE — Patient Instructions (Signed)
I do think you have reactive airway flare on top of bronchitis. Treat with zpack (azithromycin antibiotic) as well as albuterol inhaler as needed for shortness of breath. Push fluids and plenty of rest. May continue over the counter medicines, such as plain mucinex or immediate release guaifenesin with plenty of fluid to help mobilize mucous out. Update [place] if not improving as expected, or any fever >101.5, worsening productive cough or worsening shortness of breath.

## 2013-02-15 ENCOUNTER — Ambulatory Visit (HOSPITAL_BASED_OUTPATIENT_CLINIC_OR_DEPARTMENT_OTHER): Payer: Medicare Other | Admitting: Oncology

## 2013-02-15 ENCOUNTER — Encounter: Payer: Self-pay | Admitting: Oncology

## 2013-02-15 ENCOUNTER — Telehealth: Payer: Self-pay | Admitting: Oncology

## 2013-02-15 ENCOUNTER — Other Ambulatory Visit: Payer: Self-pay | Admitting: Emergency Medicine

## 2013-02-15 ENCOUNTER — Other Ambulatory Visit (HOSPITAL_BASED_OUTPATIENT_CLINIC_OR_DEPARTMENT_OTHER): Payer: Medicare Other | Admitting: Lab

## 2013-02-15 VITALS — BP 158/86 | HR 69 | Temp 98.3°F | Resp 20 | Ht 59.0 in | Wt 159.6 lb

## 2013-02-15 DIAGNOSIS — D0511 Intraductal carcinoma in situ of right breast: Secondary | ICD-10-CM

## 2013-02-15 DIAGNOSIS — D0590 Unspecified type of carcinoma in situ of unspecified breast: Secondary | ICD-10-CM

## 2013-02-15 DIAGNOSIS — D0510 Intraductal carcinoma in situ of unspecified breast: Secondary | ICD-10-CM

## 2013-02-15 LAB — COMPREHENSIVE METABOLIC PANEL (CC13)
ALT: 16 U/L (ref 0–55)
AST: 14 U/L (ref 5–34)
Albumin: 3.5 g/dL (ref 3.5–5.0)
Alkaline Phosphatase: 61 U/L (ref 40–150)
BUN: 14.3 mg/dL (ref 7.0–26.0)
CO2: 22 mEq/L (ref 22–29)
Calcium: 8.9 mg/dL (ref 8.4–10.4)
Chloride: 111 mEq/L — ABNORMAL HIGH (ref 98–109)
Creatinine: 0.8 mg/dL (ref 0.6–1.1)
Glucose: 143 mg/dl — ABNORMAL HIGH (ref 70–140)
Potassium: 4 mEq/L (ref 3.5–5.1)
Sodium: 143 mEq/L (ref 136–145)
Total Bilirubin: 0.35 mg/dL (ref 0.20–1.20)
Total Protein: 6.6 g/dL (ref 6.4–8.3)

## 2013-02-15 LAB — CBC WITH DIFFERENTIAL/PLATELET
BASO%: 1.1 % (ref 0.0–2.0)
Basophils Absolute: 0.1 10*3/uL (ref 0.0–0.1)
EOS%: 2.9 % (ref 0.0–7.0)
Eosinophils Absolute: 0.2 10*3/uL (ref 0.0–0.5)
HCT: 38.6 % (ref 34.8–46.6)
HGB: 12.9 g/dL (ref 11.6–15.9)
LYMPH%: 27.7 % (ref 14.0–49.7)
MCH: 30.3 pg (ref 25.1–34.0)
MCHC: 33.5 g/dL (ref 31.5–36.0)
MCV: 90.6 fL (ref 79.5–101.0)
MONO#: 0.3 10*3/uL (ref 0.1–0.9)
MONO%: 6 % (ref 0.0–14.0)
NEUT#: 3.6 10*3/uL (ref 1.5–6.5)
NEUT%: 62.3 % (ref 38.4–76.8)
Platelets: 260 10*3/uL (ref 145–400)
RBC: 4.26 10*6/uL (ref 3.70–5.45)
RDW: 12.9 % (ref 11.2–14.5)
WBC: 5.8 10*3/uL (ref 3.9–10.3)
lymph#: 1.6 10*3/uL (ref 0.9–3.3)

## 2013-02-15 NOTE — Progress Notes (Signed)
OFFICE PROGRESS NOTE  CC  [name], MD [address] [place] [address]  DIAGNOSIS: 67 year old female with DCIS of the right breast status post lumpectomy  PRIOR THERAPY:  #1 assure originally underwent a screening mammogram that showed a right-sided abnormality. She had an ultrasound of the mass performed that showed a 9 x 8 x 5 mm area of concern. Needle core biopsy showed a ER-positive DCIS. Was low grade. Tumor was ER +100% PR positive. She went on to have a lumpectomy with sentinel lymph node biopsy. The lumpectomy final path showed a 1.0 cm low grade DCIS with lobular neoplasia all surgical margins were -3 sentinel nodes were negative for metastatic disease.  #2 patient then went on to receive adjuvant radiation therapy. She completed this few weeks ago. Overall she tolerated it well without any significant problems.  #3 she is on  tamoxifen 20 mg daily adjuvantly as a chemopreventive. Total of 5 years of therapy is planned.  CURRENT THERAPY: Tamoxifen 20 mg daily starting 05/08/2012  INTERVAL HISTORY: Katie Zimmerman 67 y.o. female returns for followup visit. . Overall she did well without any significant complaints she denies any fevers chills night sweats headaches shortness of breath chest pains palpitations no myalgias and arthralgias. Remainder of the 10 point review of systems is negative.  MEDICAL HISTORY: Past Medical History  Diagnosis Date  . GERD (gastroesophageal reflux disease)   . Osteoporosis   . Nerve damage 2000    Right side of face after Shingles  . Complication of anesthesia     hard to wake up age 30-not since  . Cancer 12/2011    breast  . History of radiation therapy 02/29/12 -04/21/12    right breast, 5040 cGy in 28 fx, boost to total 6240 cGy  . Personal history of colonic polyps-adenomas 05/25/2012  . Breast cancer 12/2011    DCIS    ALLERGIES:  is allergic to penicillins and sulfonamide  derivatives.  MEDICATIONS:  Current Outpatient Prescriptions  Medication Sig Dispense Refill  . albuterol (PROVENTIL HFA;VENTOLIN HFA) 108 (90 BASE) MCG/ACT inhaler Inhale 2 puffs into the lungs every 6 (six) hours as needed for wheezing.  1 Inhaler  3  . amitriptyline (ELAVIL) 25 MG tablet Take 1 tablet (25 mg total) by mouth at bedtime. Increase to 2 tabs daily, may increase further to 75-100 mg daily if no side effects but not reached maximal benefit.  90 tablet  3  . azithromycin (ZITHROMAX Z-PAK) 250 MG tablet Two on day 1 followed by one daily for 4 days for total of 5 days, PO  6 each  0  . Calcium Carbonate-Vit D-Min (CALTRATE 600+D PLUS MINERALS) 600-800 MG-UNIT TABS Take 2 tablets by mouth 2 (two) times daily.      Marland Kitchen gabapentin (NEURONTIN) 300 MG capsule TAKE 2 CAPSULES BY MOUTH 3 TIMES A DAY  180 capsule  1  . tamoxifen (NOLVADEX) 20 MG tablet Take 1 tablet (20 mg total) by mouth daily.  90 tablet  3  . vitamin C (ASCORBIC ACID) 500 MG tablet Take 500 mg by mouth daily.       No current facility-administered medications for this visit.    SURGICAL HISTORY:  Past Surgical History  Procedure Laterality Date  . Ovarian cyst surgery  1964  . Tubal ligation  1976  . Foot surgery  1996    rt/lt great toes  . Glaucoma surgery  1993    laser  .  Appendectomy    . Biopsy breast  01/10/12    Right breast needle cor biopsy  . Breast lumpectomy  01/26/2012    Right/ERPR+ DCIS, right ax snbx    REVIEW OF SYSTEMS:  A comprehensive review of systems was negative.   HEALTH MAINTENANCE:  PHYSICAL EXAMINATION: Blood pressure 158/86, pulse 69, temperature 98.3 F (36.8 C), temperature source Oral, resp. rate 20, height 4\' 11"  (1.499 m), weight 159 lb 9.6 oz (72.394 kg). Body mass index is 32.22 kg/(m^2). ECOG PERFORMANCE STATUS: 0 - Asymptomatic   General appearance: alert, cooperative and appears stated age Lymph nodes: Cervical, supraclavicular, and axillary nodes normal. Resp:  clear to auscultation bilaterally Back: symmetric, no curvature. ROM normal. No CVA tenderness. Cardio: regular rate and rhythm GI: soft, non-tender; bowel sounds normal; no masses,  no organomegaly Extremities: extremities normal, atraumatic, no cyanosis or edema Neurologic: Grossly normal  Right breast reveals well-healed right lumpectomy scar and sentinel lymph node scars. There is some darkening of the skin due to radiation otherwise breast exam is normal LABORATORY DATA: Lab Results  Component Value Date   WBC 5.8 02/15/2013   HGB 12.9 02/15/2013   HCT 38.6 02/15/2013   MCV 90.6 02/15/2013   PLT 260 02/15/2013      Chemistry      Component Value Date/Time   NA 142 11/14/2012 0815   NA 142 11/12/2012 1216   K 4.0 11/14/2012 0815   K 4.0 11/12/2012 1216   CL 105 11/14/2012 0815   CL 110* 11/12/2012 1216   CO2 20 11/14/2012 0815   CO2 21* 11/12/2012 1216   BUN 13 11/14/2012 0815   BUN 11.5 11/12/2012 1216   CREATININE 0.9 11/14/2012 0815   CREATININE 0.8 11/12/2012 1216      Component Value Date/Time   CALCIUM 9.2 11/14/2012 0815   CALCIUM 9.2 11/12/2012 1216   ALKPHOS 57 11/14/2012 0815   ALKPHOS 68 11/12/2012 1216   AST 17 11/14/2012 0815   AST 14 11/12/2012 1216   ALT 19 11/14/2012 0815   ALT 13 11/12/2012 1216   BILITOT 0.6 11/14/2012 0815   BILITOT 0.35 11/12/2012 1216    Diagnosis 1. Breast, lumpectomy, Right - DUCTAL CARCINOMA IN SITU, LOW GRADE, SPANNING 1.0 CM. - LOBULAR NEOPLASIA (ATYPICAL LOBULAR HYPERPLASIA). - THE SURGICAL RESECTION MARGINS ARE NEGATIVE FOR DUCTAL CARCINOMA. - SEE ONCOLOGY TABLE BELOW. 2. Lymph node, sentinel, biopsy, Right axillary - THERE IS NO EVIDENCE OF CARCINOMA IN 1 OF 1 LYMPH NODE (0/1). 3. Lymph node, sentinel, biopsy, Right axillary - THERE IS NO EVIDENCE OF CARCINOMA IN 1 OF 1 LYMPH NODE (0/1). - SEE COMMENT. 4. Lymph node, sentinel, biopsy, Right axillary - THERE IS NO EVIDENCE OF CARCINOMA IN 1 OF 1 LYMPH NODE (0/1). Microscopic Comment 1.  BREAST, IN SITU CARCINOMA Specimen, including laterality: Right breast Procedure: Needle localized lumpectomy Grade of carcinoma: Low grade Necrosis: Not identified Estimated tumor size: (gross measurement): 1.0 cm Treatment effect: N/A Distance to closest margin: 0.5 cm to posterior margin (gross measurement) Breast prognostic profile: [phone] Estrogen receptor: 100%, strong staining intensity Progesterone receptor: 100%, strong staining intensity Lymph nodes: Examined: 3 Lymph nodes with metastasis: 0 TNM: pTis, pN0 Comments: The carcinoma cells are negative for cytokeratin 5/6. P63, smooth muscle myosin and calponin stains 1 of 3 Duplicate copy   RADIOGRAPHIC STUDIES:  No results found.  ASSESSMENT: 67 year old female with  #1 low grade DCIS measuring 1.0 cm status post lumpectomy in August 2013. Final pathology revealed that ER positive  PR positive DCIS 3 sentinel nodes negative for disease. She is now status post adjuvant radiation therapy completed in November 2013.  #2 she is a good candidate for chemoprevention with tamoxifen 20 mg daily for her DCIS. Risks and benefits of tamoxifen have been discussed with her.   PLAN:   #1 patient will continue with tamoxifen 20 mg daily.  #2 she is recommended to see her gynecologist ophthalmologist on a yearly basis. If she develops any side effects from tamoxifen she is to call me immediately.  #3 I will plan on seeing her back in 6 months time or sooner if need arises.   All questions were answered. The patient knows to call the clinic with any problems, questions or concerns. We can certainly see the patient much sooner if necessary.  I spent 25 minutes counseling the patient face to face. The total time spent in the appointment was 30 minutes.    Drue Second, MD Medical/Oncology [facility] [place] [phone] (beeper) [phone] (Office)  02/15/2013, 12:44 PM

## 2013-02-15 NOTE — Patient Instructions (Addendum)
Continue tamoxifen 20 mg daily  We will see you back in 6 months 

## 2013-02-18 ENCOUNTER — Other Ambulatory Visit: Payer: Self-pay | Admitting: Family Medicine

## 2013-02-18 NOTE — Telephone Encounter (Signed)
Last office visit 11/20/2012.  Ok to refill?

## 2013-02-20 ENCOUNTER — Other Ambulatory Visit: Payer: Self-pay | Admitting: *Deleted

## 2013-02-20 NOTE — Telephone Encounter (Signed)
Received refill request for [facility] from [facility] [place]. Still has active refills on her [facility] at [facility] [place]. Called and left VM for patient to call office regarding refill. ? Changing pharmacy.

## 2013-02-20 NOTE — Telephone Encounter (Signed)
Patient called back to report she alternates getting script between the two pharmacies and they should be able to just transfer the script without calling our office. She will call and "take care of it".

## 2013-04-04 ENCOUNTER — Other Ambulatory Visit: Payer: Self-pay

## 2013-04-23 ENCOUNTER — Other Ambulatory Visit: Payer: Self-pay | Admitting: Otolaryngology

## 2013-04-23 DIAGNOSIS — H81399 Other peripheral vertigo, unspecified ear: Secondary | ICD-10-CM

## 2013-05-03 ENCOUNTER — Other Ambulatory Visit: Payer: Medicare Other

## 2013-05-06 ENCOUNTER — Ambulatory Visit
Admission: RE | Admit: 2013-05-06 | Discharge: 2013-05-06 | Disposition: A | Discharge status: Home / Self Care | Payer: Medicare Other | Source: Ambulatory Visit | Attending: Otolaryngology | Admitting: Otolaryngology

## 2013-05-06 DIAGNOSIS — H81399 Other peripheral vertigo, unspecified ear: Secondary | ICD-10-CM

## 2013-05-06 MED ORDER — GADOBENATE DIMEGLUMINE 529 MG/ML IV SOLN
14.0000 mL | Freq: Once | INTRAVENOUS | Status: AC | PRN
  Administered 2013-05-06: 14 mL via INTRAVENOUS

## 2013-05-16 ENCOUNTER — Other Ambulatory Visit: Payer: Self-pay | Admitting: Family Medicine

## 2013-05-16 NOTE — Telephone Encounter (Signed)
Overdue for yearly eval. Refill x 3 months, have pt schedule CPX or follow up ASAP

## 2013-05-16 NOTE — Telephone Encounter (Signed)
Last office visit 12/24/2012 with Dr. Gutierrez.  Ok to refill? 

## 2013-06-18 ENCOUNTER — Other Ambulatory Visit: Payer: Self-pay | Admitting: Family Medicine

## 2013-06-19 NOTE — Telephone Encounter (Signed)
Pt last visit was 12/24/12 w/ Dr Darnell Level.  Ok to refill?

## 2013-07-09 ENCOUNTER — Telehealth (INDEPENDENT_AMBULATORY_CARE_PROVIDER_SITE_OTHER): Payer: Self-pay

## 2013-07-09 DIAGNOSIS — Z853 Personal history of malignant neoplasm of breast: Secondary | ICD-10-CM

## 2013-07-09 NOTE — Telephone Encounter (Signed)
Called pt b/c I am checking my charts for 2/17 for the pt's coming to see Dr Donne Hazel. I did not see a recent mgm on the pt and Dr Donne Hazel mentioned in his note from last Feb.2014 that the pt would be due in July 2014 for a mgm. I asked the pt and she couldn't tell me when her last mgm was which I then told her it was 2013 on our records. I advised her that we would be scheduling her for a mgm and call her back.

## 2013-07-10 NOTE — Telephone Encounter (Signed)
Called pt back to notify her that I have her scheduled for a [facility]m [facility] 07[facility]17/13 arrive at 3:15. The pt understands.

## 2013-07-15 ENCOUNTER — Telehealth (INDEPENDENT_AMBULATORY_CARE_PROVIDER_SITE_OTHER): Payer: Self-pay

## 2013-07-15 NOTE — Telephone Encounter (Signed)
LMOM stating that I did r/s her appt from 2/17 to 3/23 arrive at 2:00/2:20.

## 2013-07-16 ENCOUNTER — Ambulatory Visit (INDEPENDENT_AMBULATORY_CARE_PROVIDER_SITE_OTHER): Payer: Medicare Other | Admitting: General Surgery

## 2013-07-19 ENCOUNTER — Other Ambulatory Visit: Payer: Self-pay | Admitting: Family Medicine

## 2013-07-19 NOTE — Telephone Encounter (Signed)
Last office visit 12/24/2012 with Dr. Danise Mina.  Last refilled 06/18/2013 for #180.  Ok to refill?

## 2013-08-08 ENCOUNTER — Ambulatory Visit
Admission: RE | Admit: 2013-08-08 | Discharge: 2013-08-08 | Disposition: A | Discharge status: Home / Self Care | Payer: Self-pay | Source: Ambulatory Visit | Attending: General Surgery | Admitting: General Surgery

## 2013-08-08 DIAGNOSIS — Z853 Personal history of malignant neoplasm of breast: Secondary | ICD-10-CM

## 2013-08-15 ENCOUNTER — Telehealth: Payer: Self-pay | Admitting: Oncology

## 2013-08-15 NOTE — Telephone Encounter (Signed)
, °

## 2013-08-16 ENCOUNTER — Other Ambulatory Visit: Payer: Medicare Other

## 2013-08-16 ENCOUNTER — Ambulatory Visit: Payer: Medicare Other | Admitting: Oncology

## 2013-08-16 ENCOUNTER — Ambulatory Visit: Payer: Medicare Other

## 2013-08-19 ENCOUNTER — Ambulatory Visit (INDEPENDENT_AMBULATORY_CARE_PROVIDER_SITE_OTHER): Payer: Medicare Other | Admitting: General Surgery

## 2013-08-19 ENCOUNTER — Encounter (INDEPENDENT_AMBULATORY_CARE_PROVIDER_SITE_OTHER): Payer: Self-pay | Admitting: General Surgery

## 2013-08-19 VITALS — BP 138/84 | HR 72 | Temp 98.4°F | Ht 59.0 in | Wt 162.0 lb

## 2013-08-19 DIAGNOSIS — D0590 Unspecified type of carcinoma in situ of unspecified breast: Secondary | ICD-10-CM

## 2013-08-19 DIAGNOSIS — D0510 Intraductal carcinoma in situ of unspecified breast: Secondary | ICD-10-CM

## 2013-08-19 NOTE — Patient Instructions (Signed)

## 2013-08-19 NOTE — Progress Notes (Signed)
Subjective:     Patient ID: Katie Zimmerman, female   DOB: 07/10/1945, 68 y.o.   MRN: [id number]  HPI This is a 68 year old female who underwent a right lumpectomy and sentinel node biopsy for right-sided ductal carcinoma in situ. This was followed by radiation therapy she is now on tamoxifen. She is having some hot flashes from the tamoxifen (that are getting better) but is otherwise doing very well. She is up to date on her mammograms. She is doing her own exams. She has no real complaints referable to her breasts at this point except for some persistent numbness. She is taking care of a number of family members at this point.  Review of Systems EXAM:  DIGITAL DIAGNOSTIC bilateral MAMMOGRAM WITH CAD  COMPARISON: Prior exams  ACR Breast Density Category c: The breast tissue is heterogeneously  dense, which may obscure small masses.  FINDINGS:  Right lumpectomy changes are noted. No evidence for malignancy is  identified in either breast. Round calcifications are reidentified  in the central left breast, stable.  Mammographic images were processed with CAD.  IMPRESSION:  No evidence for malignancy in either breast. Expected right  lumpectomy changes are noted.  RECOMMENDATION:  Diagnostic mammogram is suggested in 1 year. (Code:DM-B-01Y)      Objective:   Physical Exam Physical Exam  Constitutional: She appears well-developed and well-nourished.  Neck: Neck supple.  Pulmonary/Chest: Right breast exhibits no inverted nipple, no mass, no nipple discharge, no skin change and no tenderness. Left breast exhibits no mass, no nipple discharge, no skin change and no tenderness. Breasts are symmetrical.  Lymphadenopathy:  She has no cervical adenopathy.  She has no axillary adenopathy.  Right: No supraclavicular adenopathy present.  Left: No supraclavicular adenopathy present.  Well healed breast/axllary incisions     Assessment:     Stage 0 breast cancer     Plan:     She has no clinical  evidence of recurrence. She's going to continue her own exams and will get her mammograms as scheduled. I will see her back in five years

## 2013-08-28 ENCOUNTER — Other Ambulatory Visit: Payer: Self-pay | Admitting: Family Medicine

## 2013-08-29 NOTE — Telephone Encounter (Signed)
Last office visit 12/24/2012 with Dr. Danise Mina.  Ok to refill?

## 2013-10-01 ENCOUNTER — Other Ambulatory Visit: Payer: Self-pay | Admitting: Family Medicine

## 2013-10-02 NOTE — Telephone Encounter (Signed)
Last office visit 12/24/2012 with Dr. Danise Mina.  CPX scheduled for 11/22/2013.  Last refilled 08/29/2013 for #180.  Ok to refill?

## 2013-10-18 ENCOUNTER — Other Ambulatory Visit: Payer: Self-pay

## 2013-10-18 MED ORDER — AMITRIPTYLINE HCL 25 MG PO TABS: 25.0000 mg | ORAL_TABLET | Freq: Every day | ORAL | Status: DC

## 2013-10-18 NOTE — Telephone Encounter (Signed)
Prescription faxed to [facility] at [phone].

## 2013-10-18 NOTE — Telephone Encounter (Signed)
Pt left v/m requesting refill amitriptyline to [facility].

## 2013-10-31 ENCOUNTER — Encounter: Payer: Self-pay | Admitting: Family Medicine

## 2013-10-31 ENCOUNTER — Ambulatory Visit (INDEPENDENT_AMBULATORY_CARE_PROVIDER_SITE_OTHER): Payer: Medicare Other | Admitting: Family Medicine

## 2013-10-31 VITALS — BP 132/82 | HR 96 | Temp 98.6°F | Wt 168.5 lb

## 2013-10-31 DIAGNOSIS — J209 Acute bronchitis, unspecified: Secondary | ICD-10-CM

## 2013-10-31 MED ORDER — HYDROCOD POLST-CHLORPHEN POLST 10-8 MG/5ML PO LQCR: 5.0000 mL | Freq: Every evening | ORAL | Status: DC | PRN

## 2013-10-31 MED ORDER — AZITHROMYCIN 250 MG PO TABS: ORAL_TABLET | ORAL | Status: DC

## 2013-10-31 NOTE — Assessment & Plan Note (Signed)
Anticipate acute bronchitis Treat with zpack and tussionex. Red flags to return discussed.

## 2013-10-31 NOTE — Progress Notes (Signed)
Pre visit review using our clinic review tool, if applicable. No additional management support is needed unless otherwise documented below in the visit note. 

## 2013-10-31 NOTE — Patient Instructions (Signed)
I think you have bronchitis - treat with zpack and tussionex for cough at night time.  May continue alka seltzer during the day. Push fluids and plenty of rest. May take ibuprofen 400mg  with food for bronchial inflammation. Update [place] if not improving as expected, or if fever >101, or worsening productive cough.

## 2013-10-31 NOTE — Progress Notes (Signed)
BP 132/82  Pulse 96  Temp(Src) 98.6 F (37 C) (Oral)  Wt 168 lb 8 oz (76.431 kg)  SpO2 96%   CC: cough  Subjective:    Patient ID: Katie Zimmerman, female    DOB: 1945/09/23, 68 y.o.   MRN: [id number]  HPI: Katie Zimmerman is a 68 y.o. female presenting on 10/31/2013 for Cough   Worsening cough for the last week, especially bad last night that kept her up.  Productive cough of green sputum. Possible chills last night. Some coughing fits and headaches. + PNdrainage.  No abd pain, congestion, rhinorrhea, ST, ear or tooth pain, dyspnea or wheeze.  Self treated with tessalon perls which help. Also tried alka seltzer cold and flu night time.  Husband sick with cough recently. No smokers at home. No h/o asthma.  Chronic post herpetic neuralgia R ear with some resultant hearing loss. Also h/o R TMJ.  Past Medical History  Diagnosis Date  . GERD (gastroesophageal reflux disease)   . Osteoporosis   . Nerve damage 2000    Right side of face after Shingles  . Complication of anesthesia     hard to wake up age 61-not since  . Cancer 12/2011    breast  . History of radiation therapy 02/29/12 -04/21/12    right breast, 5040 cGy in 28 fx, boost to total 6240 cGy  . Personal history of colonic polyps-adenomas 05/25/2012  . Breast cancer 12/2011    DCIS     Relevant past medical, surgical, family and social history reviewed and updated as indicated.  Allergies and medications reviewed and updated. Current Outpatient Prescriptions on File Prior to Visit  Medication Sig  . amitriptyline (ELAVIL) 25 MG tablet Take 1 tablet (25 mg total) by mouth at bedtime. Increase to 2 tabs daily, may increase further to 75-100 mg daily if no side effects but not reached maximal benefit.  . Calcium Carbonate-Vit D-Min (CALTRATE 600+D PLUS MINERALS) 600-800 MG-UNIT TABS Take 2 tablets by mouth 2 (two) times daily.  Marland Kitchen gabapentin (NEURONTIN) 300 MG capsule TAKE 2 CAPSULES BY MOUTH 3 TIMES A DAY  . tamoxifen  (NOLVADEX) 20 MG tablet Take 1 tablet (20 mg total) by mouth daily.  . vitamin C (ASCORBIC ACID) 500 MG tablet Take 500 mg by mouth daily.  Marland Kitchen albuterol (PROVENTIL HFA;VENTOLIN HFA) 108 (90 BASE) MCG/ACT inhaler Inhale 2 puffs into the lungs every 6 (six) hours as needed for wheezing.   No current facility-administered medications on file prior to visit.    Review of Systems Per HPI unless specifically indicated above    Objective:    BP 132/82  Pulse 96  Temp(Src) 98.6 F (37 C) (Oral)  Wt 168 lb 8 oz (76.431 kg)  SpO2 96%  Physical Exam  Nursing note and vitals reviewed. Constitutional: She appears well-developed and well-nourished. No distress.  HENT:  Head: Normocephalic and atraumatic.  Right Ear: Hearing, tympanic membrane, external ear and ear canal normal.  Left Ear: Hearing, tympanic membrane, external ear and ear canal normal.  Nose: No mucosal edema or rhinorrhea. Right sinus exhibits no maxillary sinus tenderness and no frontal sinus tenderness. Left sinus exhibits no maxillary sinus tenderness and no frontal sinus tenderness.  Mouth/Throat: Uvula is midline, oropharynx is clear and moist and mucous membranes are normal. No oropharyngeal exudate, posterior oropharyngeal edema, posterior oropharyngeal erythema or tonsillar abscesses.  Eyes: Conjunctivae and EOM are normal. Pupils are equal, round, and reactive to light. No scleral icterus.  Neck:  Normal range of motion. Neck supple.  Cardiovascular: Normal rate, regular rhythm, normal heart sounds and intact distal pulses.   No murmur heard. Pulmonary/Chest: Effort normal and breath sounds normal. No respiratory distress. She has no wheezes. She has no rales.  Harsh coughing fits  Lymphadenopathy:    She has no cervical adenopathy.  Skin: Skin is warm and dry. No rash noted.   Results for orders placed in visit on 02/15/13  CBC WITH DIFFERENTIAL      Result Value Ref Range   WBC 5.8  3.9 - 10.3 10e3/uL   NEUT# 3.6   1.5 - 6.5 10e3/uL   HGB 12.9  11.6 - 15.9 g/dL   HCT 38.6  34.8 - 46.6 %   Platelets 260  145 - 400 10e3/uL   MCV 90.6  79.5 - 101.0 fL   MCH 30.3  25.1 - 34.0 pg   MCHC 33.5  31.5 - 36.0 g/dL   RBC 4.26  3.70 - 5.45 10e6/uL   RDW 12.9  11.2 - 14.5 %   lymph# 1.6  0.9 - 3.3 10e3/uL   MONO# 0.3  0.1 - 0.9 10e3/uL   Eosinophils Absolute 0.2  0.0 - 0.5 10e3/uL   Basophils Absolute 0.1  0.0 - 0.1 10e3/uL   NEUT% 62.3  38.4 - 76.8 %   LYMPH% 27.7  14.0 - 49.7 %   MONO% 6.0  0.0 - 14.0 %   EOS% 2.9  0.0 - 7.0 %   BASO% 1.1  0.0 - 2.0 %  COMPREHENSIVE METABOLIC PANEL (AV40)      Result Value Ref Range   Sodium 143  136 - 145 mEq/L   Potassium 4.0  3.5 - 5.1 mEq/L   Chloride 111 (*) 98 - 109 mEq/L   CO2 22  22 - 29 mEq/L   Glucose 143 (*) 70 - 140 mg/dl   BUN 14.3  7.0 - 26.0 mg/dL   Creatinine 0.8  0.6 - 1.1 mg/dL   Total Bilirubin 0.35  0.20 - 1.20 mg/dL   Alkaline Phosphatase 61  40 - 150 U/L   AST 14  5 - 34 U/L   ALT 16  0 - 55 U/L   Total Protein 6.6  6.4 - 8.3 g/dL   Albumin 3.5  3.5 - 5.0 g/dL   Calcium 8.9  8.4 - 10.4 mg/dL      Assessment & Plan:   Problem List Items Addressed This Visit   Acute bronchitis - Primary     Anticipate acute bronchitis Treat with zpack and tussionex. Red flags to return discussed.        Follow up plan: Return if symptoms worsen or fail to improve.

## 2013-11-04 ENCOUNTER — Other Ambulatory Visit: Payer: Self-pay | Admitting: Family Medicine

## 2013-11-04 ENCOUNTER — Telehealth: Payer: Self-pay

## 2013-11-04 NOTE — Telephone Encounter (Signed)
Pt left v/m pt was seen on 10/31/13; pt has finished Z pak and is no worse but is no better than when seen on 10/31/13; pt continues with prod cough with green phlegm (Tussionex helps cough at night) and now head congestion; some SOB after coughing episode. No fever,no S/T and no wheezing now.[facility] [place]. Pt wants to know if should get another antibiotic or should pt come back for another appt? Pt request cb.

## 2013-11-04 NOTE — Telephone Encounter (Signed)
zpack is long acting antibiotic - would give this a few more days to see if any improvement. Let us know sooner if any worsening sxs. Also recommend starting ibuprofen 400mg  twice daily with meals as long as she tolerates NSAIDs Lab Results  Component Value Date   CREATININE 0.8 02/15/2013

## 2013-11-04 NOTE — Telephone Encounter (Signed)
Patient advised.

## 2013-11-05 NOTE — Telephone Encounter (Signed)
Last office visit 10/31/2013 with Dr. Danise Mina.  Last refilled 05/06/205 for #180.  Ok to refill?

## 2013-11-13 ENCOUNTER — Ambulatory Visit (INDEPENDENT_AMBULATORY_CARE_PROVIDER_SITE_OTHER)
Admission: RE | Admit: 2013-11-13 | Discharge: 2013-11-13 | Disposition: A | Discharge status: Home / Self Care | Payer: Medicare Other | Source: Ambulatory Visit | Attending: Internal Medicine | Admitting: Internal Medicine

## 2013-11-13 ENCOUNTER — Encounter: Payer: Self-pay | Admitting: Internal Medicine

## 2013-11-13 ENCOUNTER — Ambulatory Visit (INDEPENDENT_AMBULATORY_CARE_PROVIDER_SITE_OTHER): Payer: Medicare Other | Admitting: Internal Medicine

## 2013-11-13 VITALS — BP 130/86 | HR 77 | Temp 98.3°F | Wt 172.0 lb

## 2013-11-13 DIAGNOSIS — R05 Cough: Secondary | ICD-10-CM

## 2013-11-13 DIAGNOSIS — R059 Cough, unspecified: Secondary | ICD-10-CM

## 2013-11-13 DIAGNOSIS — J209 Acute bronchitis, unspecified: Secondary | ICD-10-CM

## 2013-11-13 MED ORDER — BENZONATATE 200 MG PO CAPS: 200.0000 mg | ORAL_CAPSULE | Freq: Two times a day (BID) | ORAL | Status: DC | PRN

## 2013-11-13 MED ORDER — HYDROCOD POLST-CHLORPHEN POLST 10-8 MG/5ML PO LQCR: 5.0000 mL | Freq: Every evening | ORAL | Status: DC | PRN

## 2013-11-13 NOTE — Progress Notes (Signed)
HPI  Pt presents to the clinic today with c/o worsening cough. She was seen for the same 10/31/13. Diagnosed with bronchitis, She was given azithromax and tussionex. She reports her cough has not improved. The cough is productive of thick green mucous. She is blowing green mucous out of her nose. The cough is worse at night. She has been taking alka seltzer and tessalon pearls. She denies fever, chills or body aches.  Review of Systems      Past Medical History  Diagnosis Date  . GERD (gastroesophageal reflux disease)   . Osteoporosis   . PHN (postherpetic neuralgia) 2000    Right side of face  . Complication of anesthesia     hard to wake up age 68-not since  . Breast cancer 12/2011  . History of radiation therapy 02/29/12 -04/21/12    right breast, 5040 cGy in 28 fx, boost to total 6240 cGy  . Personal history of colonic polyps-adenomas 05/25/2012  . Breast cancer 12/2011    DCIS    Family History  Problem Relation Age of Onset  . Hypertension Mother   . Cancer Mother     LIVER cancer  . Hypertension Father   . Diabetes Father   . Heart failure Father   . Colon cancer Neg Hx   . Stomach cancer Neg Hx   . Breast cancer Sister 68    NOT hormone receptor breast cancer  . Breast cancer Maternal Aunt 75  . Breast cancer Other     dx in her 42s  . Breast cancer Cousin 11    maternal cousin  . Breast cancer Cousin     maternal cousin  . Breast cancer Cousin     maternal cousin  . Cancer Cousin     unknown form of cancer; maternal cousin  . Cancer Cousin     unknown form of cancer; maternal cousin  . Cancer Cousin 11    ? lung cancer; maternal cousin  . Breast cancer Cousin     dx 61s-50s; paternal cousin  . Breast cancer Cousin     dx in her 38s; paternal cousin    History   Social History  . Marital Status: Married    Spouse Name: N/A    Number of Children: 2  . Years of Education: N/A   Occupational History  . CONSULTANT    Social History Main Topics  .  Smoking status: Never Smoker   . Smokeless tobacco: Never Used  . Alcohol Use: Yes     Comment: occasional glass of wine  . Drug Use: No  . Sexual Activity: Not Currently   Other Topics Concern  . Not on file   Social History Narrative   Reviewed 09/2011   Regular exercise-yes-intermittantly at [facility] gym   Diet: fruits and veggies   Living will: Working on setting up, MOST form given to review and return (reviewed 2014)   HCPOA:Husband    Allergies  Allergen Reactions  . Penicillins     REACTION: throat swells and rash  . Sulfonamide Derivatives     REACTION: rash     Constitutional:  Denies headache, fatigue, fever or abrupt weight changes.  HEENT:  Positive sore throat. Denies eye redness, eye pain, pressure behind the eyes, facial pain, nasal congestion, ear pain, ringing in the ears, wax buildup, runny nose or bloody nose. Respiratory: Positive cough. Denies difficulty breathing or shortness of breath.  Cardiovascular: Denies chest pain, chest tightness, palpitations or swelling in the  hands or feet.   No other specific complaints in a complete review of systems (except as listed in HPI above).  Objective:  BP 130/86  Pulse 77  Temp(Src) 98.3 F (36.8 C) (Oral)  Wt 172 lb (78.019 kg)  SpO2 97%  Wt Readings from Last 3 Encounters:  11/13/13 172 lb (78.019 kg)  10/31/13 168 lb 8 oz (76.431 kg)  08/19/13 162 lb (73.483 kg)     General: Appears her stated age, well developed, well nourished in NAD. HEENT: Head: normal shape and size; Eyes: sclera white, no icterus, conjunctiva pink, PERRLA and EOMs intact; Ears: Tm's gray and intact, normal light reflex; Nose: mucosa pink and moist, septum midline; Throat/Mouth: + PND. Teeth present, mucosa erythematous and moist, no exudate noted, no lesions or ulcerations noted.  Neck: Neck supple, trachea midline. No massses, lumps or thyromegaly present.  Cardiovascular: Normal rate and rhythm. S1,S2 noted.  No murmur, rubs or  gallops noted. No JVD or BLE edema. No carotid bruits noted. Pulmonary/Chest: Normal effort and positive vesicular breath sounds. No respiratory distress. No wheezes, rales or ronchi noted.      Assessment & Plan:   Acute Bronchitis, resolved but with lingering cough:  Get some rest and drink plenty of water Do salt water gargles for the sore throat Will refill tussionex cough syrup and give RX for tessalon pearls during the day She is insistent on a chest xray, although her lungs are clear. Ordered chest xray, if positive for pneumonia, will start levaquin.  RTC as needed or if symptoms persist.

## 2013-11-13 NOTE — Patient Instructions (Addendum)

## 2013-11-13 NOTE — Progress Notes (Signed)
Pre visit review using our clinic review tool, if applicable. No additional management support is needed unless otherwise documented below in the visit note. 

## 2013-11-15 ENCOUNTER — Other Ambulatory Visit: Payer: Medicare Other

## 2013-11-19 ENCOUNTER — Telehealth: Payer: Self-pay | Admitting: Hematology and Oncology

## 2013-11-19 NOTE — Telephone Encounter (Signed)
, °

## 2013-11-22 ENCOUNTER — Encounter: Payer: Medicare Other | Admitting: Family Medicine

## 2013-11-25 ENCOUNTER — Telehealth: Payer: Self-pay | Admitting: Internal Medicine

## 2013-11-25 NOTE — Telephone Encounter (Signed)
Called, spoke with pt -  C/o cough x 1 month.  Cough is prod with green mucus.  Also has SOB when outside and walking, chest tightness - worse when coughing, and sweats.  No fever or CP.  Was seen by [facility] office at [facility]  On 6/4 and given zpak and tussionex.  Believes symptoms are unchanged from this.  Was seen again on 6/17 at [facility] - cxr done during that time.  Pt has also been using tessalon pearles with some relief.  Pt reports she was seen by Dr. Annamaria Boots many years ago prior to him going with [facility].  States she had symptoms similar to this and was dx with pna by [facility] during that time.  She is requesting to see only CY again for above symptoms and would like an appt soon.  No openings.  Please advise.  Thank you.

## 2013-11-25 NOTE — Telephone Encounter (Signed)
Per CY-okay to work patient in sooner. I spoke with patient-she is aware to be here at 11:00am for 11:15am consult with CY on Friday 12-06-13. Pt is aware to bring her insurance card, co-pay, and list or bottles of her current medications. Pt is aware of our physical address and to call prior to CS5 with any questions or concerns. Nothing more needed at this time.

## 2013-11-28 ENCOUNTER — Ambulatory Visit: Payer: Medicare Other | Admitting: Oncology

## 2013-11-28 ENCOUNTER — Other Ambulatory Visit: Payer: Medicare Other

## 2013-12-02 ENCOUNTER — Other Ambulatory Visit: Payer: Self-pay | Admitting: Oncology

## 2013-12-02 DIAGNOSIS — D0591 Unspecified type of carcinoma in situ of right breast: Secondary | ICD-10-CM

## 2013-12-06 ENCOUNTER — Ambulatory Visit (INDEPENDENT_AMBULATORY_CARE_PROVIDER_SITE_OTHER): Payer: Medicare Other | Admitting: Internal Medicine

## 2013-12-06 ENCOUNTER — Encounter: Payer: Self-pay | Admitting: Internal Medicine

## 2013-12-06 VITALS — BP 150/86 | HR 93 | Ht 58.5 in | Wt 168.0 lb

## 2013-12-06 DIAGNOSIS — Z923 Personal history of irradiation: Secondary | ICD-10-CM

## 2013-12-06 DIAGNOSIS — R05 Cough: Secondary | ICD-10-CM

## 2013-12-06 DIAGNOSIS — J209 Acute bronchitis, unspecified: Secondary | ICD-10-CM | POA: Insufficient documentation

## 2013-12-06 DIAGNOSIS — J42 Unspecified chronic bronchitis: Principal | ICD-10-CM

## 2013-12-06 DIAGNOSIS — R059 Cough, unspecified: Secondary | ICD-10-CM

## 2013-12-06 MED ORDER — BENZONATATE 200 MG PO CAPS: 200.0000 mg | ORAL_CAPSULE | Freq: Two times a day (BID) | ORAL | Status: DC | PRN

## 2013-12-06 MED ORDER — CLARITHROMYCIN 500 MG PO TABS: ORAL_TABLET | ORAL | Status: DC

## 2013-12-06 NOTE — Assessment & Plan Note (Signed)
This is first significant bronchitis episode in several years, and most sustained with persistent green sputum described after Z pak. No acute febrile event or aspiration event recognized.  Plan- Biaxin, Mucinex, refill benzonatate

## 2013-12-06 NOTE — Progress Notes (Signed)
12/06/13- 67 yoF never smoker( hx prolonged second hand smoke) former [place] Chest pt.w/ chronic bronchitis - Prod cough with green mucus x 1 1/2 mo, SOB when walking and outside, and chest tightness.  Complicated by breast CA, GERD Past hx chronic bronchitis, with pneumonia. Doing very well in recent years. Now increased cough x 6 weeks, non-specific onset. PCP saw in [place], tussionex, then benzonatate.Sputum still green. Some wheeze. Prefers albuterol HFA over Combivent. SOB in heat. Worse cough lying down. Denies fever, sweats, blood.  Hx R lumpectomy/ XRT. CXR 11/13/13- IMPRESSION:  1. Mild cardiomegaly.  2. No evidence for acute cardiopulmonary abnormality.  Electronically Signed  By: Shon Hale M.D.  On: 11/13/2013 14:52   Prior to Admission medications   Medication Sig Start Date End Date Taking? Authorizing Provider  albuterol-ipratropium (COMBIVENT) 18-103 MCG/ACT inhaler Inhale 1 puff into the lungs every 4 (four) hours as needed for wheezing or shortness of breath.   Yes Historical Provider, MD  amitriptyline (ELAVIL) 25 MG tablet Take 1 tablet (25 mg total) by mouth at bedtime. Increase to 2 tabs daily, may increase further to 75-100 mg daily if no side effects but not reached maximal benefit. 10/18/13  Yes Amy E Bedsole, MD  benzonatate (TESSALON) 200 MG capsule Take 1 capsule (200 mg total) by mouth 2 (two) times daily as needed for cough. 12/06/13  Yes Deneise Lever, MD  Calcium Carbonate-Vit D-Min (CALTRATE 600+D PLUS MINERALS) 600-800 MG-UNIT TABS Take 2 tablets by mouth 2 (two) times daily.   Yes Historical Provider, MD  gabapentin (NEURONTIN) 300 MG capsule TAKE 2 CAPSULES BY MOUTH 3 TIMES A DAY   Yes Spencer Copland, MD  tamoxifen (NOLVADEX) 20 MG tablet TAKE 1 TABLET BY MOUTH EVERY DAY 12/02/13  Yes Minette Headland, NP  vitamin C (ASCORBIC ACID) 500 MG tablet Take 500 mg by mouth daily.   Yes Historical Provider, MD  albuterol (PROVENTIL HFA;VENTOLIN HFA) 108 (90 BASE)  MCG/ACT inhaler Inhale 2 puffs into the lungs every 6 (six) hours as needed for wheezing. 12/24/12   Ria Bush, MD  chlorpheniramine-HYDROcodone (TUSSIONEX) 10-8 MG/5ML LQCR Take 5 mLs by mouth at bedtime as needed for cough. Sedation precautions 11/13/13   Webb Silversmith, NP  clarithromycin Binnie Rail) 500 MG tablet 1 twice daily after meals 12/06/13   Deneise Lever, MD   Past Medical History  Diagnosis Date  . GERD (gastroesophageal reflux disease)   . Osteoporosis   . PHN (postherpetic neuralgia) 2000    Right side of face  . Complication of anesthesia     hard to wake up age 68-not since  . Breast cancer 12/2011  . History of radiation therapy 02/29/12 -04/21/12    right breast, 5040 cGy in 28 fx, boost to total 6240 cGy  . Personal history of colonic polyps-adenomas 05/25/2012  . Breast cancer 12/2011    DCIS  . Fibromyalgia    Past Surgical History  Procedure Laterality Date  . Ovarian cyst surgery  1964  . Tubal ligation  1976  . Foot surgery  1996    rt/lt great toes  . Glaucoma surgery  1993    laser  . Appendectomy    . Biopsy breast  01/10/12    Right breast needle cor biopsy  . Breast lumpectomy  01/26/2012    Right/ERPR+ DCIS, right ax snbx   Family History  Problem Relation Age of Onset  . Hypertension Mother   . Cancer Mother     LIVER cancer  .  Hypertension Father   . Diabetes Father   . Heart failure Father   . Colon cancer Neg Hx   . Stomach cancer Neg Hx   . Breast cancer Sister 96    NOT hormone receptor breast cancer  . Breast cancer Maternal Aunt 75  . Breast cancer Other     dx in her 3s  . Breast cancer Cousin 107    maternal cousin  . Breast cancer Cousin     maternal cousin  . Breast cancer Cousin     maternal cousin  . Cancer Cousin     unknown form of cancer; maternal cousin  . Cancer Cousin     unknown form of cancer; maternal cousin  . Cancer Cousin 11    ? lung cancer; maternal cousin  . Breast cancer Cousin     dx 5s-50s;  paternal cousin  . Breast cancer Cousin     dx in her 42s; paternal cousin   History   Social History  . Marital Status: Married    Spouse Name: N/A    Number of Children: 2  . Years of Education: N/A   Occupational History  . CONSULTANT     Retired   Social History Main Topics  . Smoking status: Never Smoker   . Smokeless tobacco: Never Used  . Alcohol Use: Yes     Comment: occasional glass of wine  . Drug Use: No  . Sexual Activity: Not Currently   Other Topics Concern  . Not on file   Social History Narrative   Reviewed 09/2011   Regular exercise-yes-intermittantly at [facility] gym   Diet: fruits and veggies   Living will: Working on setting up, MOST form given to review and return (reviewed 2014)   HCPOA:Husband   ROS-see HPI Constitutional:   No-   weight loss, night sweats, fevers, chills, fatigue, lassitude. HEENT:   No-  headaches, difficulty swallowing, tooth/dental problems, sore throat,       No-  sneezing, itching, ear ache, nasal congestion, post nasal drip,  CV:  No-   chest pain, orthopnea, PND, swelling in lower extremities, anasarca,                                  dizziness, palpitations Resp: +shortness of breath with exertion or at rest.              +productive cough,  No non-productive cough,  No- coughing up of blood.            +change in color of mucus.  No- wheezing.   Skin: No-   rash or lesions. GI:  No-   heartburn, indigestion, abdominal pain, nausea, vomiting, diarrhea,                 change in bowel habits, loss of appetite GU: No-   dysuria, change in color of urine, no urgency or frequency.  No- flank pain. MS:  No-   joint pain or swelling.  No- decreased range of motion.  No- back pain. Neuro-     nothing unusual Psych:  No- change in mood or affect. No depression or anxiety.  No memory loss.  OBJ- Physical Exam General- Alert, Oriented, Affect-appropriate, Distress- none acute, overweight Skin- rash-none, lesions- none,  excoriation- none Lymphadenopathy- none Head- atraumatic            Eyes- Gross vision intact, PERRLA, conjunctivae and secretions  clear            Ears- Hearing, canals-normal            Nose- Clear, no-Septal dev, mucus, polyps, erosion, perforation             Throat- Mallampati III , mucosa clear , drainage- none, tonsils- atrophic Neck- flexible , trachea midline, no stridor , thyroid nl, carotid no bruit Chest - symmetrical excursion , unlabored           Heart/CV- RRR , no murmur , no gallop  , no rub, nl s1 s2                           - JVD- none , edema- none, stasis changes- none, varices- none           Lung- clear to P&A, wheeze- none, cough+barking , dullness-none, rub- none           Chest wall-  +Hx R lumpectomy/XRT Abd- tender-no, distended-no, bowel sounds-present, HSM- no Br/ Gen/ Rectal- Not done, not indicated Extrem- cyanosis- none, clubbing, none, atrophy- none, strength- nl Neuro- grossly intact to observation

## 2013-12-06 NOTE — Patient Instructions (Signed)
Script sent for biaxin antibiotic and for the benzonatate perles  Please call as needed

## 2013-12-06 NOTE — Assessment & Plan Note (Signed)
I don't expect current illness to be related to her hx breast Ca/ XRT

## 2014-01-02 ENCOUNTER — Other Ambulatory Visit: Payer: Self-pay | Admitting: Family Medicine

## 2014-01-02 NOTE — Telephone Encounter (Signed)
Last office visit 11/13/2013 with Webb Silversmith.  Has CPE scheduled for 01/20/2014.  Last refilled 11/05/2013 for #180 with no refills.  Ok to refill?

## 2014-01-10 ENCOUNTER — Telehealth: Payer: Self-pay | Admitting: Family Medicine

## 2014-01-10 DIAGNOSIS — E7800 Pure hypercholesterolemia, unspecified: Secondary | ICD-10-CM

## 2014-01-10 DIAGNOSIS — R7303 Prediabetes: Secondary | ICD-10-CM

## 2014-01-10 NOTE — Telephone Encounter (Signed)
Message copied by [name] on Fri Jan 10, 2014  2:10 PM ------      Message from: Ellamae Sia      Created: Wed Jan 08, 2014 11:07 AM      Regarding: Lab orders for Monday, 8.17.15       Patient is scheduled for CPX labs, please order future labs, Thanks , Terri       ------

## 2014-01-13 ENCOUNTER — Other Ambulatory Visit: Payer: Medicare Other

## 2014-01-16 ENCOUNTER — Other Ambulatory Visit (INDEPENDENT_AMBULATORY_CARE_PROVIDER_SITE_OTHER): Payer: Medicare Other

## 2014-01-16 DIAGNOSIS — E7800 Pure hypercholesterolemia, unspecified: Secondary | ICD-10-CM

## 2014-01-16 DIAGNOSIS — R7303 Prediabetes: Secondary | ICD-10-CM

## 2014-01-16 DIAGNOSIS — R7309 Other abnormal glucose: Secondary | ICD-10-CM

## 2014-01-16 LAB — COMPREHENSIVE METABOLIC PANEL
ALT: 24 U/L (ref 0–35)
AST: 23 U/L (ref 0–37)
Albumin: 3.7 g/dL (ref 3.5–5.2)
Alkaline Phosphatase: 67 U/L (ref 39–117)
BUN: 21 mg/dL (ref 6–23)
CO2: 26 mEq/L (ref 19–32)
Calcium: 9.5 mg/dL (ref 8.4–10.5)
Chloride: 107 mEq/L (ref 96–112)
Creatinine, Ser: 0.9 mg/dL (ref 0.4–1.2)
GFR: 65.33 mL/min (ref >60.00)
Glucose, Bld: 122 mg/dL — ABNORMAL HIGH (ref 70–99)
Potassium: 4.3 mEq/L (ref 3.5–5.1)
Sodium: 141 mEq/L (ref 135–145)
Total Bilirubin: 0.4 mg/dL (ref 0.2–1.2)
Total Protein: 7.3 g/dL (ref 6.0–8.3)

## 2014-01-16 LAB — LIPID PANEL
Cholesterol: 174 mg/dL (ref 0–200)
HDL: 45.2 mg/dL (ref >39.00)
LDL Cholesterol: 101 mg/dL — ABNORMAL HIGH (ref 0–99)
NonHDL: 128.8
Total CHOL/HDL Ratio: 4
Triglycerides: 140 mg/dL (ref 0.0–149.0)
VLDL: 28 mg/dL (ref 0.0–40.0)

## 2014-01-16 LAB — HEMOGLOBIN A1C: Hgb A1c MFr Bld: 6.3 % (ref 4.6–6.5)

## 2014-01-20 ENCOUNTER — Encounter: Payer: Self-pay | Admitting: Family Medicine

## 2014-01-20 ENCOUNTER — Ambulatory Visit (INDEPENDENT_AMBULATORY_CARE_PROVIDER_SITE_OTHER): Payer: Medicare Other | Admitting: Family Medicine

## 2014-01-20 VITALS — BP 118/80 | HR 81 | Temp 98.2°F | Ht 58.5 in | Wt 167.0 lb

## 2014-01-20 DIAGNOSIS — H9191 Unspecified hearing loss, right ear: Secondary | ICD-10-CM

## 2014-01-20 DIAGNOSIS — H9190 Unspecified hearing loss, unspecified ear: Secondary | ICD-10-CM

## 2014-01-20 DIAGNOSIS — Z23 Encounter for immunization: Secondary | ICD-10-CM

## 2014-01-20 DIAGNOSIS — E7800 Pure hypercholesterolemia, unspecified: Secondary | ICD-10-CM

## 2014-01-20 DIAGNOSIS — J42 Unspecified chronic bronchitis: Secondary | ICD-10-CM

## 2014-01-20 DIAGNOSIS — E2839 Other primary ovarian failure: Secondary | ICD-10-CM

## 2014-01-20 DIAGNOSIS — J209 Acute bronchitis, unspecified: Secondary | ICD-10-CM

## 2014-01-20 DIAGNOSIS — R7309 Other abnormal glucose: Secondary | ICD-10-CM

## 2014-01-20 DIAGNOSIS — Z0000 Encounter for general adult medical examination without abnormal findings: Secondary | ICD-10-CM

## 2014-01-20 DIAGNOSIS — R7303 Prediabetes: Secondary | ICD-10-CM

## 2014-01-20 HISTORY — DX: Unspecified hearing loss, right ear: H91.91

## 2014-01-20 NOTE — Progress Notes (Signed)
Pre visit review using our clinic review tool, if applicable. No additional management support is needed unless otherwise documented below in the visit note. 

## 2014-01-20 NOTE — Addendum Note (Signed)
Addended by: Carter Kitten on: 01/20/2014 12:29 PM   Modules accepted: Orders

## 2014-01-20 NOTE — Assessment & Plan Note (Signed)
NO current sign of infection. Keep appt with Dr. Annamaria Boots for [facility] PFTs/ further eval.

## 2014-01-20 NOTE — Assessment & Plan Note (Signed)
Improved control with diet. Work on low [employer]. Recheck in 1 year.

## 2014-01-20 NOTE — Progress Notes (Signed)
HPI  I have personally reviewed the Medicare Annual Wellness questionnaire and have noted  1. The patient's medical and social history  2. Their use of alcohol, tobacco or illicit drugs  3. Their current medications and supplements  4. The patient's functional ability including ADL's, fall risks, home safety risks and hearing or visual  impairment.  5. Diet and physical activities  6. Evidence for depression or mood disorders  The patients weight, height, BMI and visual acuity have been recorded in the chart  I have made referrals, counseling and provided education to the patient based review of the above and I have provided the pt with a written personalized care plan for preventive services.   She has been having trouble with chronic bronchitis. No relief with Z-pack, then place on biaxin with more improvement.  She still has some cough productive at times. No fever, mild SOB with walking.  She is now followed by Dr. Annamaria Boots.. She has appt in Sept.  Elevated Cholesterol: At goal LDL not on any medication.  Lab Results  Component Value Date   CHOL 174 01/16/2014   HDL 45.20 01/16/2014   LDLCALC 101* 01/16/2014   LDLDIRECT 158.7 10/27/2011   TRIG 140.0 01/16/2014   CHOLHDL 4 01/16/2014  Using medications without problems: None  Muscle aches: None  Diet compliance: moderate  Exercise: Going on walks.  Other complaints:   BP Readings from Last 3 Encounters:  01/20/14 118/80  12/06/13 150/86  11/13/13 130/86    Prediabetes: improved. Lab Results  Component Value Date   HGBA1C 6.3 01/16/2014    Hx of DCIS of the right breast status post lumpectomy.  Now tamoxifen x on yr 1/5  Dr. Sondra Come radiation onc.  Dr. Humphrey Rolls is oncologist. Reviewed last [place] . Dr. Emmie Niemann in surgeon.   GERD: Stable control, rarely symptomatic.   Post herpetic neuralgia: Pain is fairly well controlled on gabapentin and amitryptiline. Not using any additional pain med.    Review of Systems  Constitutional:  Negative for fever, fatigue and unexpected weight change.  HENT: Negative for ear pain, congestion, sore throat, sneezing, trouble swallowing and sinus pressure.  Eyes: Negative for pain and itching.  Respiratory: Negative for cough, shortness of breath and wheezing.  Cardiovascular: Negative for chest pain, palpitations and leg swelling.  Gastrointestinal: Negative for nausea, abdominal pain, diarrhea, constipation and blood in stool.  Genitourinary: Negative for dysuria, hematuria, vaginal discharge, difficulty urinating and menstrual problem.  Skin: Negative for rash.  Neurological: Negative for syncope, weakness, light-headedness, numbness and headaches.  Psychiatric/Behavioral: Negative for confusion and dysphoric mood. The patient is not nervous/anxious.  Objective:   Physical Exam  Constitutional: Vital signs are normal. She appears well-developed and well-nourished. She is cooperative. Non-toxic appearance. She does not appear ill. No distress.  HENT:  Head: Normocephalic.  Right Ear: Hearing, tympanic membrane, external ear and ear canal normal.  Left Ear: Hearing, tympanic membrane, external ear and ear canal normal.  Nose: Nose normal.  Eyes: Conjunctivae, EOM and lids are normal. Pupils are equal, round, and reactive to light. No foreign bodies found.  Neck: Trachea normal and normal range of motion. Neck supple. Carotid bruit is not present. No mass and no thyromegaly present.  Cardiovascular: Normal rate, regular rhythm, S1 normal, S2 normal, normal heart sounds and intact distal pulses. Exam reveals no gallop.  No murmur heard.  Pulmonary/Chest: Effort normal and breath sounds normal. No respiratory distress. She has no wheezes. She has no rhonchi. She has no rales.  Abdominal: Soft. Normal appearance and bowel sounds are normal. She exhibits no distension, no fluid wave, no abdominal bruit and no mass. There is no hepatosplenomegaly. There is no tenderness. There is no rebound,  no guarding and no CVA tenderness. No hernia.  Lymphadenopathy:  She has no cervical adenopathy.  She has no axillary adenopathy.  Neurological: She is alert. She has normal strength. No cranial nerve deficit or sensory deficit.  Skin: Skin is warm, dry and intact. No rash noted.  Psychiatric: Her speech is normal and behavior is normal. Judgment normal. Her mood appears not anxious. Cognition and memory are normal. She does not exhibit a depressed mood.  Assessment & Plan:   The patient's preventative maintenance and recommended screening tests for an annual wellness exam were reviewed in full today.  Brought up to date unless services declined.  Counselled on the importance of diet, exercise, and its role in overall health and mortality.  The patient's FH and SH was reviewed, including their home life, tobacco status, and drug and alcohol status.    Vaccines: Uptodate with pneumovax, consider TDAP, shingles coverage.  Due for flu and prevnar. Nonsmoker  04/2012 Dr. Carlean Purl, colonoscopy 5 adenomas max 6 mm Repeat colon 04/2017  DVE/PAP: Dr. Gertie Fey, GYN last pelvic 09/2011.Marland Kitchen She will schedule.  Mammo: Breast cancer followed by new onc, last mammo 07/2013 DEXA: osteo something? 2007, followed by GYN previously but we will order this year.  Non smoker, sig second hand some Hearing loss in right ear.

## 2014-01-20 NOTE — Assessment & Plan Note (Signed)
Well controlled on no med. Encouraged exercise, weight loss, healthy eating habits.   

## 2014-01-20 NOTE — Patient Instructions (Addendum)
Mucinex DM twice daily for cough. Keep follow up with Dr. Annamaria Boots. Look into Tdap and shingles vaccines. Schedule CPX with Dr. Gertie Fey. Call if you would like [place] to do that here. Stop at front desk to set up Bone density.

## 2014-01-28 ENCOUNTER — Encounter: Payer: Self-pay | Admitting: Family Medicine

## 2014-01-28 ENCOUNTER — Ambulatory Visit
Admission: RE | Admit: 2014-01-28 | Discharge: 2014-01-28 | Disposition: A | Discharge status: Home / Self Care | Payer: Medicare Other | Source: Ambulatory Visit | Attending: Family Medicine | Admitting: Family Medicine

## 2014-01-28 DIAGNOSIS — M810 Age-related osteoporosis without current pathological fracture: Secondary | ICD-10-CM | POA: Insufficient documentation

## 2014-01-28 DIAGNOSIS — E2839 Other primary ovarian failure: Secondary | ICD-10-CM

## 2014-01-28 HISTORY — DX: Age-related osteoporosis without current pathological fracture: M81.0

## 2014-02-04 ENCOUNTER — Encounter: Payer: Self-pay | Admitting: Family Medicine

## 2014-02-04 ENCOUNTER — Ambulatory Visit (INDEPENDENT_AMBULATORY_CARE_PROVIDER_SITE_OTHER): Payer: Medicare Other | Admitting: Family Medicine

## 2014-02-04 VITALS — BP 124/80 | HR 85 | Temp 98.6°F | Ht 58.5 in | Wt 166.5 lb

## 2014-02-04 DIAGNOSIS — M810 Age-related osteoporosis without current pathological fracture: Secondary | ICD-10-CM

## 2014-02-04 NOTE — Progress Notes (Signed)
Pre visit review using our clinic review tool, if applicable. No additional management support is needed unless otherwise documented below in the visit note. 

## 2014-02-04 NOTE — Assessment & Plan Note (Addendum)
Restart calcium and vit D. Start weight bearing exercise.  Continue tamoxifen.  We  will get previous bone density to compare from her past GYN. If significant change, will consider fosamax.  Repeat bone density in 2 years.  Addendum: 2007 bone density shows T score-2/6 in spine and -12.0 in hip... No significant difference form 2015. Fosamax not needed.

## 2014-02-04 NOTE — Patient Instructions (Addendum)
Calcium 600 mg twice daily, vit D3 400 IU twice daily.  Working on [employer] bearing exercsie. We  will get previous bone density to compare from her past GYN. If significant change, will consider fosamax. Repeat bone density in 2 years.

## 2014-02-04 NOTE — Progress Notes (Signed)
   Subjective:    Patient ID: Katie Zimmerman, female    DOB: 08/28/1945, 68 y.o.   MRN: [id number]  HPI  68 year old female with history of breast cancecer presents to follow up recent DEXA showing T score -2.6 in spine, new diagnosis osteoporosis.   She states today that she had a DEXA in 2007 that showed osteoporosis as well, ? T score.  Performed by Dr. Gertie Fey.   She is currently on tamoxifen for breast cancer. HAs 3 more years of this.   Vit D  37 last year, 2014.  She has been taking 1200 mg calcium and 400IU of vit D in last 5 years, but has not been on any in last few weeks.  She does have chronic back pain in upper back. Ongoing for many years. She does have some pain in mid back off and on in last few years with picking up baby.  Ibuprofen helps.  No exercise.  Review of Systems  Constitutional: Negative for fever and fatigue.  Respiratory: Negative for shortness of breath.   Cardiovascular: Negative for chest pain.  Gastrointestinal: Negative for abdominal pain.       Objective:   Physical Exam  Constitutional: Vital signs are normal. She appears well-developed and well-nourished. She is cooperative.  Non-toxic appearance. She does not appear ill. No distress.  HENT:  Head: Normocephalic.  Right Ear: Hearing, tympanic membrane, external ear and ear canal normal. Tympanic membrane is not erythematous, not retracted and not bulging.  Left Ear: Hearing, tympanic membrane, external ear and ear canal normal. Tympanic membrane is not erythematous, not retracted and not bulging.  Nose: No mucosal edema or rhinorrhea. Right sinus exhibits no maxillary sinus tenderness and no frontal sinus tenderness. Left sinus exhibits no maxillary sinus tenderness and no frontal sinus tenderness.  Mouth/Throat: Uvula is midline, oropharynx is clear and moist and mucous membranes are normal.  Eyes: Conjunctivae, EOM and lids are normal. Pupils are equal, round, and reactive to light. Lids are  everted and swept, no foreign bodies found.  Neck: Trachea normal and normal range of motion. Neck supple. Carotid bruit is not present. No mass and no thyromegaly present.  Cardiovascular: Normal rate, regular rhythm, S1 normal, S2 normal, normal heart sounds, intact distal pulses and normal pulses.  Exam reveals no gallop and no friction rub.   No murmur heard. Pulmonary/Chest: Effort normal and breath sounds normal. Not tachypneic. No respiratory distress. She has no decreased breath sounds. She has no wheezes. She has no rhonchi. She has no rales.  Abdominal: Soft. Normal appearance and bowel sounds are normal. There is no tenderness.  Neurological: She is alert.  Skin: Skin is warm, dry and intact. No rash noted.  Psychiatric: Her speech is normal and behavior is normal. Judgment and thought content normal. Her mood appears not anxious. Cognition and memory are normal. She does not exhibit a depressed mood.          Assessment & Plan:

## 2014-02-06 ENCOUNTER — Ambulatory Visit: Payer: Medicare Other | Admitting: Internal Medicine

## 2014-02-10 ENCOUNTER — Encounter: Payer: Self-pay | Admitting: Internal Medicine

## 2014-02-10 ENCOUNTER — Other Ambulatory Visit: Payer: Self-pay | Admitting: Family Medicine

## 2014-02-10 ENCOUNTER — Ambulatory Visit (INDEPENDENT_AMBULATORY_CARE_PROVIDER_SITE_OTHER): Payer: Medicare Other | Admitting: Internal Medicine

## 2014-02-10 VITALS — BP 136/74 | HR 77 | Ht 58.5 in | Wt 165.0 lb

## 2014-02-10 DIAGNOSIS — R059 Cough, unspecified: Secondary | ICD-10-CM

## 2014-02-10 MED ORDER — PROMETHAZINE-CODEINE 6.25-10 MG/5ML PO SYRP: 5.0000 mL | ORAL_SOLUTION | Freq: Four times a day (QID) | ORAL | Status: DC | PRN

## 2014-02-10 MED ORDER — ALBUTEROL SULFATE HFA 108 (90 BASE) MCG/ACT IN AERS: 2.0000 | INHALATION_SPRAY | Freq: Four times a day (QID) | RESPIRATORY_TRACT | Status: DC | PRN

## 2014-02-10 NOTE — Progress Notes (Signed)
12/06/13- 67 yoF never smoker( hx prolonged second hand smoke) former [place] [facility] pt.w/ chronic bronchitis - Prod cough with green mucus x 1 1/2 mo, SOB when walking and outside, and chest tightness.  Complicated by breast CA, GERD Past hx chronic bronchitis, with pneumonia. Doing very well in recent years. Now increased cough x 6 weeks, non-specific onset. PCP saw in [place], tussionex, then benzonatate.Sputum still green. Some wheeze. Prefers albuterol HFA over Combivent. SOB in heat. Worse cough lying down. Denies fever, sweats, blood.  Hx R lumpectomy/ XRT. CXR 11/13/13- IMPRESSION:  1. Mild cardiomegaly.  2. No evidence for acute cardiopulmonary abnormality.  Electronically Signed  By: Shon Hale M.D.  On: 11/13/2013 14:52   02/10/14- 36 yoF never smoker( hx prolonged second hand smoke) former [place] [facility] pt.w/ chronic bronchitis .  Complicated by breast CA, GERD FOLLOWS FOR: Pt states she has improved since last visit, c/o sometimes prod cough with green/yellow mucus, SOB and chest tightness with exertion.       ROS-see HPI Constitutional:   No-   weight loss, night sweats, fevers, chills, fatigue, lassitude. HEENT:   No-  headaches, difficulty swallowing, tooth/dental problems, sore throat,       No-  sneezing, itching, ear ache, nasal congestion, post nasal drip,  CV:  No-   chest pain, orthopnea, PND, swelling in lower extremities, anasarca,                                  dizziness, palpitations Resp: +shortness of breath with exertion or at rest.              +productive cough,  No non-productive cough,  No- coughing up of blood.            +change in color of mucus.  No- wheezing.   Skin: No-   rash or lesions. GI:  No-   heartburn, indigestion, abdominal pain, nausea, vomiting, diarrhea,                 change in bowel habits, loss of appetite GU: No-   dysuria, change in color of urine, no urgency or frequency.  No- flank pain. MS:  No-   joint pain or swelling.  No- decreased  range of motion.  No- back pain. Neuro-     nothing unusual Psych:  No- change in mood or affect. No depression or anxiety.  No memory loss.  OBJ- Physical Exam General- Alert, Oriented, Affect-appropriate, Distress- none acute, overweight Skin- rash-none, lesions- none, excoriation- none Lymphadenopathy- none Head- atraumatic            Eyes- Gross vision intact, PERRLA, conjunctivae and secretions clear            Ears- Hearing, canals-normal            Nose- Clear, no-Septal dev, mucus, polyps, erosion, perforation             Throat- Mallampati III , mucosa clear , drainage- none, tonsils- atrophic Neck- flexible , trachea midline, no stridor , thyroid nl, carotid no bruit Chest - symmetrical excursion , unlabored           Heart/CV- RRR , no murmur , no gallop  , no rub, nl s1 s2                           - JVD- none , edema- none,  stasis changes- none, varices- none           Lung- clear to P&A, wheeze- none, cough+barking , dullness-none, rub- none           Chest wall-  +Hx R lumpectomy/XRT Abd- tender-no, distended-no, bowel sounds-present, HSM- no Br/ Gen/ Rectal- Not done, not indicated Extrem- cyanosis- none, clubbing, none, atrophy- none, strength- nl Neuro- grossly intact to observation

## 2014-02-10 NOTE — Telephone Encounter (Signed)
Last office visit 02/04/2014.  Last refilled 01/03/2014 for #180 with no refills.  Ok to refill?

## 2014-02-10 NOTE — Patient Instructions (Signed)
Sample x 2 Breo 200 maintenance inhaler    1 puff then rinse mouth well, once daily  Ok to continue using the albuterol rescue inhaler if needed. Script sent  Script printed for cough syrup

## 2014-02-19 ENCOUNTER — Other Ambulatory Visit: Payer: Self-pay | Admitting: *Deleted

## 2014-02-19 DIAGNOSIS — B0229 Other postherpetic nervous system involvement: Secondary | ICD-10-CM

## 2014-02-20 ENCOUNTER — Ambulatory Visit (HOSPITAL_BASED_OUTPATIENT_CLINIC_OR_DEPARTMENT_OTHER): Payer: Medicare Other | Admitting: Hematology and Oncology

## 2014-02-20 ENCOUNTER — Encounter: Payer: Self-pay | Admitting: Hematology and Oncology

## 2014-02-20 ENCOUNTER — Telehealth: Payer: Self-pay | Admitting: Hematology and Oncology

## 2014-02-20 ENCOUNTER — Other Ambulatory Visit (HOSPITAL_BASED_OUTPATIENT_CLINIC_OR_DEPARTMENT_OTHER): Payer: Medicare Other

## 2014-02-20 VITALS — BP 151/89 | HR 77 | Temp 98.0°F | Resp 18 | Ht 58.5 in | Wt 167.3 lb

## 2014-02-20 DIAGNOSIS — Z170 Estrogen receptor positive status [ER+]: Secondary | ICD-10-CM

## 2014-02-20 DIAGNOSIS — B0229 Other postherpetic nervous system involvement: Secondary | ICD-10-CM

## 2014-02-20 DIAGNOSIS — D0590 Unspecified type of carcinoma in situ of unspecified breast: Secondary | ICD-10-CM

## 2014-02-20 DIAGNOSIS — D0511 Intraductal carcinoma in situ of right breast: Secondary | ICD-10-CM

## 2014-02-20 LAB — CBC WITH DIFFERENTIAL/PLATELET
BASO%: 0.8 % (ref 0.0–2.0)
Basophils Absolute: 0.1 10*3/uL (ref 0.0–0.1)
EOS%: 5.9 % (ref 0.0–7.0)
Eosinophils Absolute: 0.4 10*3/uL (ref 0.0–0.5)
HCT: 38.8 % (ref 34.8–46.6)
HGB: 12.5 g/dL (ref 11.6–15.9)
LYMPH%: 26 % (ref 14.0–49.7)
MCH: 29.6 pg (ref 25.1–34.0)
MCHC: 32.2 g/dL (ref 31.5–36.0)
MCV: 91.7 fL (ref 79.5–101.0)
MONO#: 0.6 10*3/uL (ref 0.1–0.9)
MONO%: 8.3 % (ref 0.0–14.0)
NEUT#: 4.4 10*3/uL (ref 1.5–6.5)
NEUT%: 59 % (ref 38.4–76.8)
Platelets: 278 10*3/uL (ref 145–400)
RBC: 4.23 10*6/uL (ref 3.70–5.45)
RDW: 12.8 % (ref 11.2–14.5)
WBC: 7.5 10*3/uL (ref 3.9–10.3)
lymph#: 2 10*3/uL (ref 0.9–3.3)

## 2014-02-20 LAB — COMPREHENSIVE METABOLIC PANEL (CC13)
ALT: 21 U/L (ref 0–55)
AST: 19 U/L (ref 5–34)
Albumin: 3.5 g/dL (ref 3.5–5.0)
Alkaline Phosphatase: 73 U/L (ref 40–150)
Anion Gap: 8 mEq/L (ref 3–11)
BUN: 15.5 mg/dL (ref 7.0–26.0)
CO2: 26 mEq/L (ref 22–29)
Calcium: 9.5 mg/dL (ref 8.4–10.4)
Chloride: 110 mEq/L — ABNORMAL HIGH (ref 98–109)
Creatinine: 0.9 mg/dL (ref 0.6–1.1)
Glucose: 103 mg/dl (ref 70–140)
Potassium: 3.9 mEq/L (ref 3.5–5.1)
Sodium: 144 mEq/L (ref 136–145)
Total Bilirubin: 0.34 mg/dL (ref 0.20–1.20)
Total Protein: 6.8 g/dL (ref 6.4–8.3)

## 2014-02-20 NOTE — Assessment & Plan Note (Signed)
Right breast DCIS status post lumpectomy, radiation and now on antiestrogen therapy with tamoxifen. He 100% positive PR positive Patient tolerating tamoxifen extremely well without any major problems. Initially she had hot flashes but these subsided. Today's breast exam was normal. I reviewed the mammogram done in March 2015 which was normal. I recommended continued annual followup with mammograms. She will be seen back in 6 months with mammograms done in March 2016.  Discussed the importance of physical exercise in decreasing the likelihood of breast cancer recurrence. Recommended 30 mins daily 6 days a week of either brisk walking or cycling or swimming. Encouraged patient to eat more fruits and vegetables and decrease red meat.

## 2014-02-20 NOTE — Progress Notes (Signed)
Patient Care Team: [name], MD as PCP - General  DIAGNOSIS: DCIS of the right breast status post lumpectomy   PRIOR THERAPY:  #1March 2014: Screening mammogram that showed a right-sided abnormality. She had an ultrasound of the mass performed that showed a 9 x 8 x 5 mm area of concern. Needle core biopsy showed a ER-positive DCIS. Was low grade. Tumor was ER +100% PR positive. She went on to have a lumpectomy with sentinel lymph node biopsy. The lumpectomy final path showed a 1.0 cm low grade DCIS with lobular neoplasia all surgical margins were -3 sentinel nodes were negative for metastatic disease.   #2 patient then went on to receive adjuvant radiation therapy.   #3 she is on tamoxifen 20 mg daily adjuvantly as a chemopreventive. Total of 5 years of therapy is planned.   CURRENT THERAPY: Tamoxifen 20 mg daily starting 05/08/2012  CHIEF COMPLIANT: Six-month followup of DCIS  INTERVAL HISTORY: Katie Zimmerman is a 68 year old Caucasian lady with above-mentioned history of right breast DCIS who underwent lumpectomy followed by radiation and is currently on tamoxifen. She is tolerating tamoxifen extremely well without any major problems or concerns. She initially had some hot flashes which finally resolved. She complains of mild tenderness in the right breast where she had the surgery. It appears that the tenderness is slightly worse than before.  REVIEW OF SYSTEMS:   Constitutional: Denies fevers, chills or abnormal weight loss Eyes: Denies blurriness of vision Ears, nose, mouth, throat, and face: Denies mucositis or sore throat Respiratory: Denies cough, dyspnea or wheezes Cardiovascular: Denies palpitation, chest discomfort or lower extremity swelling Gastrointestinal:  Denies nausea, heartburn or change in bowel habits Skin: Denies abnormal skin rashes Lymphatics: Denies new lymphadenopathy or easy bruising Neurological:Denies numbness, tingling or new weaknesses Behavioral/Psych:  Mood is stable, no new changes  Breast: Pain in the right breast at the site of previous surgery All other systems were reviewed with the patient and are negative.  I have reviewed the past medical history, past surgical history, social history and family history with the patient and they are unchanged from previous note.  ALLERGIES:  is allergic to penicillins and sulfonamide derivatives.  MEDICATIONS:  Current Outpatient Prescriptions  Medication Sig Dispense Refill  . albuterol (PROVENTIL HFA;VENTOLIN HFA) 108 (90 BASE) MCG/ACT inhaler Inhale 2 puffs into the lungs every 6 (six) hours as needed for wheezing.  1 Inhaler  prn  . amitriptyline (ELAVIL) 25 MG tablet Take 1 tablet (25 mg total) by mouth at bedtime. Increase to 2 tabs daily, may increase further to 75-100 mg daily if no side effects but not reached maximal benefit.  90 tablet  0  . benzonatate (TESSALON) 200 MG capsule Take 1 capsule (200 mg total) by mouth 2 (two) times daily as needed for cough.  30 capsule  2  . calcium carbonate (OS-CAL) 600 MG TABS tablet Take 600 mg by mouth 2 (two) times daily with a meal.      . gabapentin (NEURONTIN) 300 MG capsule TAKE 2 CAPSULES BY MOUTH 3 TIMES A DAY  180 capsule  0  . promethazine-codeine (PHENERGAN WITH CODEINE) 6.25-10 MG/5ML syrup Take 5 mLs by mouth every 6 (six) hours as needed for cough.  180 mL  0  . tamoxifen (NOLVADEX) 20 MG tablet TAKE 1 TABLET BY MOUTH EVERY DAY  90 tablet  0  . vitamin C (ASCORBIC ACID) 500 MG tablet Take 500 mg by mouth daily.      . Vitamin  D, Ergocalciferol, (DRISDOL) 50000 UNITS CAPS capsule Take 50,000 Units by mouth. Take 500 units 2 times a day       No current facility-administered medications for this visit.    PHYSICAL EXAMINATION: ECOG PERFORMANCE STATUS: 0 - Asymptomatic  Filed Vitals:   02/20/14 1143  BP: 151/89  Pulse: 77  Temp: 98 F (36.7 C)  Resp: 18   Filed Weights   02/20/14 1143  Weight: 167 lb 4.8 oz (75.887 kg)     GENERAL:alert, no distress and comfortable SKIN: skin color, texture, turgor are normal, no rashes or significant lesions EYES: normal, Conjunctiva are pink and non-injected, sclera clear OROPHARYNX:no exudate, no erythema and lips, buccal mucosa, and tongue normal  NECK: supple, thyroid normal size, non-tender, without nodularity LYMPH:  no palpable lymphadenopathy in the cervical, axillary or inguinal LUNGS: clear to auscultation and percussion with normal breathing effort HEART: regular rate & rhythm and no murmurs and no lower extremity edema ABDOMEN:abdomen soft, non-tender and normal bowel sounds Musculoskeletal:no cyanosis of digits and no clubbing  NEURO: alert & oriented x 3 with fluent speech, no focal motor/sensory deficits BREAST: No palpable masses or nodules in either right or left breasts. No palpable axillary supraclavicular or infraclavicular adenopathy no breast tenderness or nipple discharge.   LABORATORY DATA:  I have reviewed the data as listed   Chemistry      Component Value Date/Time   NA 144 02/20/2014 1133   NA 141 01/16/2014 0905   K 3.9 02/20/2014 1133   K 4.3 01/16/2014 0905   CL 107 01/16/2014 0905   CL 110* 11/12/2012 1216   CO2 26 02/20/2014 1133   CO2 26 01/16/2014 0905   BUN 15.5 02/20/2014 1133   BUN 21 01/16/2014 0905   CREATININE 0.9 02/20/2014 1133   CREATININE 0.9 01/16/2014 0905      Component Value Date/Time   CALCIUM 9.5 02/20/2014 1133   CALCIUM 9.5 01/16/2014 0905   ALKPHOS 73 02/20/2014 1133   ALKPHOS 67 01/16/2014 0905   AST 19 02/20/2014 1133   AST 23 01/16/2014 0905   ALT 21 02/20/2014 1133   ALT 24 01/16/2014 0905   BILITOT 0.34 02/20/2014 1133   BILITOT 0.4 01/16/2014 0905       Lab Results  Component Value Date   WBC 7.5 02/20/2014   HGB 12.5 02/20/2014   HCT 38.8 02/20/2014   MCV 91.7 02/20/2014   PLT 278 02/20/2014   NEUTROABS 4.4 02/20/2014     RADIOGRAPHIC STUDIES: I have personally reviewed the radiology reports and agreed  with their findings. No results found.   ASSESSMENT & PLAN:  DCIS (ductal carcinoma in situ) of breast Right breast DCIS status post lumpectomy, radiation and now on antiestrogen therapy with tamoxifen. He 100% positive PR positive Patient tolerating tamoxifen extremely well without any major problems. Initially she had hot flashes but these subsided. Today's breast exam was normal. I reviewed the mammogram done in March 2015 which was normal. I recommended continued annual followup with mammograms. She will be seen back in 6 months with mammograms done in March 2016.  Discussed the importance of physical exercise in decreasing the likelihood of breast cancer recurrence. Recommended 30 mins daily 6 days a week of either brisk walking or cycling or swimming. Encouraged patient to eat more fruits and vegetables and decrease red meat.     Orders Placed This Encounter  Procedures  . MM Digital Diagnostic Bilat    Standing Status: Future  Number of Occurrences:      Standing Expiration Date: 02/20/2015    Order Specific Question:  Reason for Exam (SYMPTOM  OR DIAGNOSIS REQUIRED)    Answer:  Right breast DCIS annual follow up    Order Specific Question:  Preferred imaging location?    Answer:  [facility]   The patient has a good understanding of the overall plan. she agrees with it. She will call with any problems that may develop before her next visit here.  I spent 15 minutes counseling the patient face to face. The total time spent in the appointment was 20 minutes and more than 50% was on counseling and review of test results    Rulon Eisenmenger, MD 02/20/2014 12:41 PM

## 2014-02-20 NOTE — Telephone Encounter (Signed)
gv and printed aqppt sched and avs for pt for March adn April 2016

## 2014-02-28 ENCOUNTER — Other Ambulatory Visit: Payer: Self-pay | Admitting: Adult Health

## 2014-03-14 ENCOUNTER — Other Ambulatory Visit: Payer: Self-pay

## 2014-03-18 ENCOUNTER — Other Ambulatory Visit: Payer: Self-pay | Admitting: Family Medicine

## 2014-03-18 NOTE — Telephone Encounter (Signed)
Last office visit 02/04/2014.  Last refilled 02/11/2014 for #180 with no refills.  Ok to refill?

## 2014-04-13 ENCOUNTER — Other Ambulatory Visit: Payer: Self-pay | Admitting: Family Medicine

## 2014-04-13 NOTE — Telephone Encounter (Signed)
Last office visit 02/04/2014.  Ok to refill? 

## 2014-04-16 ENCOUNTER — Other Ambulatory Visit: Payer: Self-pay | Admitting: Family Medicine

## 2014-05-13 ENCOUNTER — Encounter: Payer: Self-pay | Admitting: Internal Medicine

## 2014-05-13 ENCOUNTER — Ambulatory Visit (INDEPENDENT_AMBULATORY_CARE_PROVIDER_SITE_OTHER): Payer: Medicare Other | Admitting: Internal Medicine

## 2014-05-13 VITALS — BP 128/80 | HR 79 | Ht 58.5 in | Wt 165.0 lb

## 2014-05-13 DIAGNOSIS — J209 Acute bronchitis, unspecified: Secondary | ICD-10-CM

## 2014-05-13 DIAGNOSIS — J42 Unspecified chronic bronchitis: Secondary | ICD-10-CM

## 2014-05-13 MED ORDER — FLUTICASONE FUROATE-VILANTEROL 100-25 MCG/INH IN AEPB: INHALATION_SPRAY | RESPIRATORY_TRACT | Status: DC

## 2014-05-13 NOTE — Patient Instructions (Signed)
Script sent to resume Breo ellipta 100, 1 puff then rinse mouth once daily  Ok to still use the rescue albuterol inhaler if needed   2 puffs every 46 hours if needed  Please call as needed

## 2014-05-13 NOTE — Progress Notes (Signed)
12/06/13- 67 yoF never smoker( hx prolonged second hand smoke) former [place] [facility] pt.w/ chronic bronchitis - Prod cough with green mucus x 1 1/2 mo, SOB when walking and outside, and chest tightness.  Complicated by breast CA, GERD Past hx chronic bronchitis, with pneumonia. Doing very well in recent years. Now increased cough x 6 weeks, non-specific onset. PCP saw in [place], tussionex, then benzonatate.Sputum still green. Some wheeze. Prefers albuterol HFA over Combivent. SOB in heat. Worse cough lying down. Denies fever, sweats, blood.  Hx R lumpectomy/ XRT. CXR 11/13/13- IMPRESSION:  1. Mild cardiomegaly.  2. No evidence for acute cardiopulmonary abnormality.  Electronically Signed  By: Shon Hale M.D.  On: 11/13/2013 14:52   02/10/14- 68 yoF never smoker( hx prolonged second hand smoke) former [place] [facility] pt.w/ chronic bronchitis .  Complicated by breast CA, GERD FOLLOWS FOR: Pt states she has improved since last visit, c/o sometimes prod cough with green/yellow mucus, SOB and chest tightness with exertion.     05/13/14-  68 yoF never smoker( hx prolonged second hand smoke) former [place] [facility] pt.w/ chronic bronchitis .  Complicated by breast CA, GERD FOLLOWS FOR:c/o cough x 2-3 wks.-dry,sob with exertion,no wheezing,no fcs,denies cp or tightness.Felt like Breo sample from last ov worked well. Husband here  ROS-see HPI Constitutional:   No-   weight loss, night sweats, fevers, chills, fatigue, lassitude. HEENT:   No-  headaches, difficulty swallowing, tooth/dental problems, sore throat,       No-  sneezing, itching, ear ache, nasal congestion, post nasal drip,  CV:  No-   chest pain, orthopnea, PND, swelling in lower extremities, anasarca,                                  dizziness, palpitations Resp: +shortness of breath with exertion or at rest.              No-productive cough,  + non-productive cough,  No- coughing up of blood.            No-change in color of mucus.  No- wheezing.    Skin: No-   rash or lesions. GI:  No-   heartburn, indigestion, abdominal pain, nausea, vomiting, diarrhea,                 change in bowel habits, loss of appetite GU: No-   dysuria, change in color of urine, no urgency or frequency.  No- flank pain. MS:  No-   joint pain or swelling.  No- decreased range of motion.  No- back pain. Neuro-     nothing unusual Psych:  No- change in mood or affect. No depression or anxiety.  No memory loss.  OBJ- Physical Exam General- Alert, Oriented, Affect-appropriate, Distress- none acute, overweight Skin- rash-none, lesions- none, excoriation- none Lymphadenopathy- none Head- atraumatic            Eyes- Gross vision intact, PERRLA, conjunctivae and secretions clear            Ears- Hearing, canals-normal            Nose- Clear, no-Septal dev, mucus, polyps, erosion, perforation             Throat- Mallampati III , mucosa clear , drainage- none, tonsils- atrophic Neck- flexible , trachea midline, no stridor , thyroid nl, carotid no bruit Chest - symmetrical excursion , unlabored           Heart/CV-  RRR , no murmur , no gallop  , no rub, nl s1 s2                           - JVD- none , edema- none, stasis changes- none, varices- none           Lung- clear to P&A, wheeze- none, cough-not coughing , dullness-none, rub- none           Chest wall-  +Hx R lumpectomy/XRT Abd-  Br/ Gen/ Rectal- Not done, not indicated Extrem- cyanosis- none, clubbing, none, atrophy- none, strength- nl Neuro- grossly intact to observation

## 2014-05-13 NOTE — Assessment & Plan Note (Signed)
Nonspecific, possibly viral Plan- Script for maintenance Breo. Med talk.

## 2014-05-19 ENCOUNTER — Other Ambulatory Visit: Payer: Self-pay | Admitting: Family Medicine

## 2014-05-19 NOTE — Telephone Encounter (Signed)
Last office visit 02/04/2014.  Last refilled 04/14/2014 for #180.  Ok to refill?

## 2014-06-12 ENCOUNTER — Other Ambulatory Visit: Payer: Self-pay | Admitting: *Deleted

## 2014-06-12 DIAGNOSIS — D0511 Intraductal carcinoma in situ of right breast: Secondary | ICD-10-CM

## 2014-06-12 MED ORDER — TAMOXIFEN CITRATE 20 MG PO TABS: 20.0000 mg | ORAL_TABLET | Freq: Every day | ORAL | Status: DC

## 2014-06-27 ENCOUNTER — Other Ambulatory Visit: Payer: Self-pay | Admitting: Family Medicine

## 2014-06-27 NOTE — Telephone Encounter (Signed)
Last office visit 02/04/2014.  Last refilled 05/20/2014 for #180 with no refills.  Ok to refill?

## 2014-07-10 ENCOUNTER — Encounter: Payer: Self-pay | Admitting: Genetic Counselor

## 2014-07-10 DIAGNOSIS — Z1379 Encounter for other screening for genetic and chromosomal anomalies: Secondary | ICD-10-CM | POA: Insufficient documentation

## 2014-07-18 ENCOUNTER — Telehealth: Payer: Self-pay | Admitting: Genetic Counselor

## 2014-07-18 NOTE — Telephone Encounter (Signed)
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  Her original test is now considered a negative genetic test result.

## 2014-08-01 ENCOUNTER — Other Ambulatory Visit: Payer: Self-pay | Admitting: Family Medicine

## 2014-08-01 NOTE — Telephone Encounter (Signed)
Last office visit 02/04/2014.  Last refilled 06/27/2014 for #180 with no refills.  Ok to refill?

## 2014-08-18 ENCOUNTER — Ambulatory Visit
Admission: RE | Admit: 2014-08-18 | Discharge: 2014-08-18 | Disposition: A | Discharge status: Home / Self Care | Payer: Medicare Other | Source: Ambulatory Visit | Attending: Hematology and Oncology | Admitting: Hematology and Oncology

## 2014-08-18 DIAGNOSIS — D0511 Intraductal carcinoma in situ of right breast: Secondary | ICD-10-CM

## 2014-08-20 ENCOUNTER — Telehealth: Payer: Self-pay | Admitting: *Deleted

## 2014-08-20 NOTE — Telephone Encounter (Signed)
Received Tier Exception Request Form from [facility] for Gabapentin 300 mg.  Form completed and faxed back to [phone].

## 2014-08-27 NOTE — Telephone Encounter (Signed)
Tier Exception Request for Gabapentin has been approved at the Tier 1 Level through August 20, 2015.

## 2014-09-01 ENCOUNTER — Ambulatory Visit: Payer: Medicare Other | Admitting: Hematology and Oncology

## 2014-09-01 ENCOUNTER — Telehealth: Payer: Self-pay | Admitting: Hematology and Oncology

## 2014-09-01 ENCOUNTER — Other Ambulatory Visit: Payer: Self-pay

## 2014-09-01 NOTE — Telephone Encounter (Signed)
Returned patient call as she needed to reschedule her appointment

## 2014-09-05 ENCOUNTER — Other Ambulatory Visit: Payer: Self-pay | Admitting: Family Medicine

## 2014-09-05 ENCOUNTER — Encounter: Payer: Self-pay | Admitting: Hematology and Oncology

## 2014-09-05 NOTE — Progress Notes (Signed)
I placed the tier exception for from [facility] for Tamoxifen on desk of nurse for Dr. Lindi Adie.

## 2014-09-05 NOTE — Telephone Encounter (Signed)
Last office visit 02/04/2014.  Last refilled 08/01/2014 for #180 with no refills.  Ok to refill?

## 2014-09-09 ENCOUNTER — Encounter: Payer: Self-pay | Admitting: Hematology and Oncology

## 2014-09-09 NOTE — Progress Notes (Signed)
I faxed to [facility] hte form for Tamoxifen   [phone]

## 2014-09-11 ENCOUNTER — Other Ambulatory Visit: Payer: Self-pay | Admitting: Hematology and Oncology

## 2014-09-11 DIAGNOSIS — D0510 Intraductal carcinoma in situ of unspecified breast: Secondary | ICD-10-CM

## 2014-09-17 ENCOUNTER — Encounter: Payer: Self-pay | Admitting: Hematology and Oncology

## 2014-09-17 NOTE — Progress Notes (Signed)
Per [facility] Tamoxifen Citrate was approved Tier 1  09/11/14-09/09/15. I will send to medical records.

## 2014-09-22 ENCOUNTER — Telehealth: Payer: Self-pay | Admitting: Hematology and Oncology

## 2014-09-22 NOTE — Telephone Encounter (Signed)
Patient had called in to cancel her 4/26 appointment as she is ill and i left a message with a new appointment

## 2014-09-23 ENCOUNTER — Telehealth: Payer: Self-pay | Admitting: Hematology and Oncology

## 2014-09-23 ENCOUNTER — Ambulatory Visit: Payer: Medicare Other | Admitting: Hematology and Oncology

## 2014-09-23 NOTE — Telephone Encounter (Signed)
Returned a call to patient as she needed to reschedule her appointment

## 2014-10-03 ENCOUNTER — Telehealth: Payer: Self-pay | Admitting: Internal Medicine

## 2014-10-03 MED ORDER — PREDNISONE 10 MG PO TABS: ORAL_TABLET | ORAL | Status: DC

## 2014-10-03 MED ORDER — DOXYCYCLINE HYCLATE 100 MG PO TABS: 100.0000 mg | ORAL_TABLET | Freq: Two times a day (BID) | ORAL | Status: DC

## 2014-10-03 NOTE — Telephone Encounter (Signed)
Called and spoke to pt. Pt c/o increase in SOB, increase use of albuterol hfa, prod cough with yellow and green mucus, chest tightness x 1 week. Pt stated she had a 100 degree temperature on 5/5, pt now afebrile. Pt taking tylenol and tessalon prn. Pt has pending appt with CY on 11/13/2014. CY is unavailable this afternoon, will send to doc of day.   MW please advise.   Allergies  Allergen Reactions  . Penicillins     REACTION: throat swells and rash  . Sulfonamide Derivatives     REACTION: rash

## 2014-10-03 NOTE — Telephone Encounter (Signed)
Called and spoke to pt. Informed pt of the recs per MW. Rx sent to preferred pharmacy. Pt verbalized understanding and denied any further questions or concerns at this time.   

## 2014-10-03 NOTE — Telephone Encounter (Signed)
Prednisone 10 mg take  4 each am x 2 days,   2 each am x 2 days,  1 each am x 2 days and stop  Doxy 100 mg twice daily ac x 10 days, to er over w/e if condition worsens

## 2014-10-06 ENCOUNTER — Telehealth: Payer: Self-pay | Admitting: Hematology and Oncology

## 2014-10-06 NOTE — Telephone Encounter (Signed)
Patient called in to reschedule her appointment °

## 2014-10-07 ENCOUNTER — Ambulatory Visit: Payer: Medicare Other | Admitting: Hematology and Oncology

## 2014-10-09 ENCOUNTER — Ambulatory Visit: Payer: Medicare Other | Admitting: Hematology and Oncology

## 2014-10-12 ENCOUNTER — Other Ambulatory Visit: Payer: Self-pay | Admitting: Family Medicine

## 2014-10-12 NOTE — Telephone Encounter (Signed)
Last office visit 02/04/2014.  Last refilled 09/05/2014 for #180 with no refills.  Ok to refill?

## 2014-10-16 ENCOUNTER — Other Ambulatory Visit: Payer: Self-pay | Admitting: General Surgery

## 2014-10-16 ENCOUNTER — Ambulatory Visit (HOSPITAL_BASED_OUTPATIENT_CLINIC_OR_DEPARTMENT_OTHER): Payer: Medicare Other | Admitting: Hematology and Oncology

## 2014-10-16 ENCOUNTER — Telehealth: Payer: Self-pay | Admitting: Hematology and Oncology

## 2014-10-16 VITALS — BP 137/71 | HR 82 | Temp 97.1°F | Resp 18 | Ht <= 58 in | Wt 165.5 lb

## 2014-10-16 DIAGNOSIS — D0591 Unspecified type of carcinoma in situ of right breast: Secondary | ICD-10-CM

## 2014-10-16 DIAGNOSIS — Z170 Estrogen receptor positive status [ER+]: Secondary | ICD-10-CM

## 2014-10-16 DIAGNOSIS — Z79810 Long term (current) use of selective estrogen receptor modulators (SERMs): Secondary | ICD-10-CM

## 2014-10-16 DIAGNOSIS — D0511 Intraductal carcinoma in situ of right breast: Secondary | ICD-10-CM

## 2014-10-16 NOTE — Progress Notes (Signed)
Patient Care Team: [name], MD as PCP - General  DIAGNOSIS: No matching staging information was found for the patient.  SUMMARY OF ONCOLOGIC HISTORY:   Cancer of right breast, stage 0   01/10/2012 Initial Diagnosis Right breast biopsy: DCIS mammogram ultrasound 9 mm abnormality ER 100% PR 100%   01/26/2012 Surgery Right lumpectomy: DCIS low-grade 1 cm, with a ledge, margins negative, 0/3 sentinel nodes negative, ER 100%, PR 100% stage 0   02/27/2012 - 04/04/2012 Radiation Therapy Adjuvant radiation therapy   05/08/2012 -  Anti-estrogen oral therapy Tamoxifen 20 mg daily    CHIEF COMPLIANT: Follow-up of right breast cancer  INTERVAL HISTORY: Katie Zimmerman is a 69 year old with above-mentioned history of right breast cancer treated with lumpectomy and radiation and is currently on tamoxifen since December 2013. She complains of tamoxifen causes occasional hot flashes but otherwise she is doing very well. She complains of uncomfortable sensation underneath the right arm with some numbness and stiffness in that area. She saw Dr. Donne Hazel earlier this morning who recommended doing an ultrasound.  REVIEW OF SYSTEMS:   Constitutional: Denies fevers, chills or abnormal weight loss Eyes: Denies blurriness of vision Ears, nose, mouth, throat, and face: Denies mucositis or sore throat Respiratory: Denies cough, dyspnea or wheezes Cardiovascular: Denies palpitation, chest discomfort or lower extremity swelling Gastrointestinal:  Denies nausea, heartburn or change in bowel habits Skin: Denies abnormal skin rashes Lymphatics: Denies new lymphadenopathy or easy bruising Neurological:Denies numbness, tingling or new weaknesses Behavioral/Psych: Mood is stable, no new changes  Breast: Discomfort underneath the right axilla All other systems were reviewed with the patient and are negative.  I have reviewed the past medical history, past surgical history, social history and family history with the  patient and they are unchanged from previous note.  ALLERGIES:  is allergic to penicillins and sulfonamide derivatives.  MEDICATIONS:  Current Outpatient Prescriptions  Medication Sig Dispense Refill  . albuterol (PROVENTIL HFA;VENTOLIN HFA) 108 (90 BASE) MCG/ACT inhaler Inhale 2 puffs into the lungs every 6 (six) hours as needed for wheezing. 1 Inhaler prn  . amitriptyline (ELAVIL) 25 MG tablet Take 1 tablet (25 mg total) by mouth at bedtime. Increase to 2 tabs daily, may increase further to 75-100 mg daily if no side effects but not reached maximal benefit. 90 tablet 0  . benzonatate (TESSALON) 200 MG capsule Take 1 capsule (200 mg total) by mouth 2 (two) times daily as needed for cough. 30 capsule 2  . calcium carbonate (OS-CAL) 600 MG TABS tablet Take 600 mg by mouth 2 (two) times daily with a meal.    . Fluticasone Furoate-Vilanterol 100-25 MCG/INH AEPB 1 puff then rinse mouth, once daily 60 each prn  . gabapentin (NEURONTIN) 300 MG capsule TAKE 2 CAPSULES BY MOUTH 3 TIMES A DAY 180 capsule 0  . tamoxifen (NOLVADEX) 20 MG tablet TAKE 1 TABLET (20 MG TOTAL) BY MOUTH DAILY. 90 tablet 3  . vitamin C (ASCORBIC ACID) 500 MG tablet Take 500 mg by mouth daily.    . Vitamin D, Ergocalciferol, (DRISDOL) 50000 UNITS CAPS capsule Take 50,000 Units by mouth. Take 500 units 2 times a day    . promethazine-codeine (PHENERGAN WITH CODEINE) 6.25-10 MG/5ML syrup Take 5 mLs by mouth every 6 (six) hours as needed for cough. (Patient not taking: Reported on 10/16/2014) 180 mL 0   No current facility-administered medications for this visit.    PHYSICAL EXAMINATION: ECOG PERFORMANCE STATUS: 1 - Symptomatic but completely ambulatory  Filed Vitals:  10/16/14 1424  BP: 137/71  Pulse: 82  Temp: 97.1 F (36.2 C)  Resp: 18   Filed Weights   10/16/14 1424  Weight: 165 lb 8 oz (75.07 kg)    GENERAL:alert, no distress and comfortable SKIN: skin color, texture, turgor are normal, no rashes or significant  lesions EYES: normal, Conjunctiva are pink and non-injected, sclera clear OROPHARYNX:no exudate, no erythema and lips, buccal mucosa, and tongue normal  NECK: supple, thyroid normal size, non-tender, without nodularity LYMPH:  no palpable lymphadenopathy in the cervical, axillary or inguinal LUNGS: clear to auscultation and percussion with normal breathing effort HEART: regular rate & rhythm and no murmurs and no lower extremity edema ABDOMEN:abdomen soft, non-tender and normal bowel sounds Musculoskeletal:no cyanosis of digits and no clubbing  NEURO: alert & oriented x 3 with fluent speech, no focal motor/sensory deficits  LABORATORY DATA:  I have reviewed the data as listed   Chemistry      Component Value Date/Time   NA 144 02/20/2014 1133   NA 141 01/16/2014 0905   K 3.9 02/20/2014 1133   K 4.3 01/16/2014 0905   CL 107 01/16/2014 0905   CL 110* 11/12/2012 1216   CO2 26 02/20/2014 1133   CO2 26 01/16/2014 0905   BUN 15.5 02/20/2014 1133   BUN 21 01/16/2014 0905   CREATININE 0.9 02/20/2014 1133   CREATININE 0.9 01/16/2014 0905      Component Value Date/Time   CALCIUM 9.5 02/20/2014 1133   CALCIUM 9.5 01/16/2014 0905   ALKPHOS 73 02/20/2014 1133   ALKPHOS 67 01/16/2014 0905   AST 19 02/20/2014 1133   AST 23 01/16/2014 0905   ALT 21 02/20/2014 1133   ALT 24 01/16/2014 0905   BILITOT 0.34 02/20/2014 1133   BILITOT 0.4 01/16/2014 0905       Lab Results  Component Value Date   WBC 7.5 02/20/2014   HGB 12.5 02/20/2014   HCT 38.8 02/20/2014   MCV 91.7 02/20/2014   PLT 278 02/20/2014   NEUTROABS 4.4 02/20/2014    ASSESSMENT & PLAN:  Cancer of right breast, stage 0 Right breast DCIS status post lumpectomy 01/26/2012 followed by adjuvant radiation and currently on tamoxifen since 05/08/2012, ER 100%, PR 100%, 1 cm DCIS, low-grade  Tamoxifen toxicities: 1. Mild hot flashes which have since resolved 2. Mild myalgias  Breast Cancer Surveillance: 1. Breast exam  10/16/2014: Normal 2. Mammogram 08/18/2014 No abnormalities. Postsurgical changes. Breast Density Category B. I recommended that she get 3-D mammograms for surveillance. Discussed the differences between different breast density categories.   Return to clinic in 6 months for follow-up and after that once a year which we will alternate with Dr. Donne Hazel  No orders of the defined types were placed in this encounter.   The patient has a good understanding of the overall plan. she agrees with it. she will call with any problems that may develop before the next visit here.   Rulon Eisenmenger, MD

## 2014-10-16 NOTE — Telephone Encounter (Signed)
Gave avs & calendar for November.  °

## 2014-10-16 NOTE — Assessment & Plan Note (Signed)
Right breast DCIS status post lumpectomy 01/26/2012 followed by adjuvant radiation and currently on tamoxifen since 05/08/2012, ER 100%, PR 100%, 1 cm DCIS, low-grade  Tamoxifen toxicities: 1. Mild hot flashes which have since resolved 2. Mild myalgias  Breast Cancer Surveillance: 1. Breast exam 10/16/2014: Normal 2. Mammogram 08/18/2014 No abnormalities. Postsurgical changes. Breast Density Category B. I recommended that she get 3-D mammograms for surveillance. Discussed the differences between different breast density categories.   Return to clinic in 1 year for follow-up

## 2014-10-21 ENCOUNTER — Other Ambulatory Visit: Payer: Medicare Other

## 2014-10-23 ENCOUNTER — Ambulatory Visit
Admission: RE | Admit: 2014-10-23 | Discharge: 2014-10-23 | Disposition: A | Discharge status: Home / Self Care | Payer: Medicare Other | Source: Ambulatory Visit | Attending: General Surgery | Admitting: General Surgery

## 2014-10-23 ENCOUNTER — Other Ambulatory Visit: Payer: Self-pay | Admitting: General Surgery

## 2014-10-23 DIAGNOSIS — D0591 Unspecified type of carcinoma in situ of right breast: Secondary | ICD-10-CM

## 2014-11-13 ENCOUNTER — Ambulatory Visit (INDEPENDENT_AMBULATORY_CARE_PROVIDER_SITE_OTHER): Payer: Medicare Other | Admitting: Internal Medicine

## 2014-11-13 ENCOUNTER — Encounter: Payer: Self-pay | Admitting: Internal Medicine

## 2014-11-13 ENCOUNTER — Other Ambulatory Visit: Payer: Self-pay | Admitting: Family Medicine

## 2014-11-13 VITALS — BP 96/80 | HR 82 | Ht 59.0 in | Wt 164.0 lb

## 2014-11-13 DIAGNOSIS — J209 Acute bronchitis, unspecified: Secondary | ICD-10-CM | POA: Diagnosis not present

## 2014-11-13 DIAGNOSIS — J42 Unspecified chronic bronchitis: Secondary | ICD-10-CM

## 2014-11-13 MED ORDER — DOXYCYCLINE HYCLATE 100 MG PO TABS
ORAL_TABLET | ORAL | Status: DC
Start: 2014-11-13 — End: 2015-01-22

## 2014-11-13 MED ORDER — LEVALBUTEROL HCL 0.63 MG/3ML IN NEBU
0.6300 mg | INHALATION_SOLUTION | Freq: Once | RESPIRATORY_TRACT | Status: AC
  Administered 2014-11-13: 0.63 mg via RESPIRATORY_TRACT

## 2014-11-13 MED ORDER — FLUTICASONE FUROATE-VILANTEROL 200-25 MCG/INH IN AEPB: 1.0000 | INHALATION_SPRAY | Freq: Every day | RESPIRATORY_TRACT | Status: AC

## 2014-11-13 MED ORDER — METHYLPREDNISOLONE ACETATE 80 MG/ML IJ SUSP
80.0000 mg | Freq: Once | INTRAMUSCULAR | Status: AC
  Administered 2014-11-13: 80 mg via INTRAMUSCULAR

## 2014-11-13 NOTE — Progress Notes (Signed)
12/06/13- 67 yoF never smoker( hx prolonged second hand smoke) former [facility] pt.w/ chronic bronchitis - Prod cough with green mucus x 1 1/2 mo, SOB when walking and outside, and chest tightness.  Complicated by breast CA, GERD Past hx chronic bronchitis, with pneumonia. Doing very well in recent years. Now increased cough x 6 weeks, non-specific onset. PCP saw in [place], tussionex, then benzonatate.Sputum still green. Some wheeze. Prefers albuterol HFA over Combivent. SOB in heat. Worse cough lying down. Denies fever, sweats, blood.  Hx R lumpectomy/ XRT. CXR 11/13/13- IMPRESSION:  1. Mild cardiomegaly.  2. No evidence for acute cardiopulmonary abnormality.  Electronically Signed  By: Shon Hale M.D.  On: 11/13/2013 14:52   02/10/14- 56 yoF never smoker( hx prolonged second hand smoke) former [facility] pt.w/ chronic bronchitis .  Complicated by breast CA, GERD FOLLOWS FOR: Pt states she has improved since last visit, c/o sometimes prod cough with green/yellow mucus, SOB and chest tightness with exertion.     05/13/14-  68 yoF never smoker( hx prolonged second hand smoke) former [facility] pt.w/ chronic bronchitis .  Complicated by breast CA, GERD FOLLOWS FOR:c/o cough x 2-3 wks.-dry,sob with exertion,no wheezing,no fcs,denies cp or tightness.Felt like Breo sample from last ov worked well. Husband here  11/13/14- 64 yoF never smoker( hx prolonged second hand smoke) former [facility] pt.w/ chronic bronchitis .  Complicated by hx R breast CA/ XRT, GERD Follows For: Pt c/o prod cough with yellow mucus, wheezing,  increased SOB with activity, chest congestion. Symptoms started on 11/10/14. Husband here We had called doxycycline and prednisone for an acute bronchitis in early May. She did well until 3 days ago-onset of the new bronchitis episode with wheeze, cough, yellow sputum. Denies sore throat, fever, swollen glands.  ROS-see HPI Constitutional:   No-   weight loss, night sweats, fevers,  chills, fatigue, lassitude. HEENT:   No-  headaches, difficulty swallowing, tooth/dental problems, sore throat,       No-  sneezing, itching, ear ache, nasal congestion, post nasal drip,  CV:  No-   chest pain, orthopnea, PND, swelling in lower extremities, anasarca,                                  dizziness, palpitations Resp: +shortness of breath with exertion or at rest.              +-productive cough,  + non-productive cough,  No- coughing up of blood.            +change in color of mucus.  + wheezing.   Skin: No-   rash or lesions. GI:  No-   heartburn, indigestion, abdominal pain, nausea, vomiting, diarrhea,                 change in bowel habits, loss of appetite GU:  MS:  No-   joint pain or swelling.   Neuro-     nothing unusual Psych:  No- change in mood or affect. No depression or anxiety.  No memory loss.  OBJ- Physical Exam General- Alert, Oriented, Affect-appropriate, Distress- none acute, overweight Skin- rash-none, lesions- none, excoriation- none Lymphadenopathy- none Head- atraumatic            Eyes- Gross vision intact, PERRLA, conjunctivae and secretions clear            Ears- Hearing, canals-normal  Nose- Clear, no-Septal dev, mucus, polyps, erosion, perforation             Throat- Mallampati III , mucosa clear , drainage- none, tonsils- atrophic Neck- flexible , trachea midline, no stridor , thyroid nl, carotid no bruit Chest - symmetrical excursion , unlabored           Heart/CV- RRR , no murmur , no gallop  , no rub, nl s1 s2                           - JVD- none , edema- none, stasis changes- none, varices- none           Lung- clear to P&A, wheeze+, cough+ , dullness-none, rub- none           Chest wall-  +Hx R lumpectomy/XRT Abd-  Br/ Gen/ Rectal- Not done, not indicated Extrem- cyanosis- none, clubbing, none, atrophy- none, strength- nl Neuro- grossly intact to observation

## 2014-11-13 NOTE — Patient Instructions (Signed)
Script sent for doxycycline   Neb xop 0.63   Bronchitis chronic w acute exacerbation  Depo 80  Sample Breo 200   1 puff then rinse mouth, once daily. When this is used up, go back to your Breo 100 as before.

## 2014-12-07 ENCOUNTER — Other Ambulatory Visit: Payer: Self-pay | Admitting: Family Medicine

## 2014-12-07 NOTE — Telephone Encounter (Signed)
Last office visit 02/04/2014.  Last refilled 11/13/2014 for #180 with no refills.  CPE scheduled 01/22/2015.  Ok to refill?

## 2014-12-11 NOTE — Assessment & Plan Note (Signed)
Watching frequency of exacerbations Plan-nebulizer bronchodilator, Depo-Medrol, sample Breo 200, refill albuterol rescue inhaler with discussion, doxycycline

## 2015-01-11 ENCOUNTER — Other Ambulatory Visit: Payer: Self-pay | Admitting: Family Medicine

## 2015-01-11 NOTE — Telephone Encounter (Signed)
Last office visit 02/04/2014.  CPE scheduled for 01/22/2015.  Last refilled 12/08/2014 for #180 with no refills.  Ok tor refill?

## 2015-01-15 ENCOUNTER — Other Ambulatory Visit (INDEPENDENT_AMBULATORY_CARE_PROVIDER_SITE_OTHER): Payer: Medicare Other

## 2015-01-15 ENCOUNTER — Telehealth: Payer: Self-pay | Admitting: Family Medicine

## 2015-01-15 DIAGNOSIS — R7309 Other abnormal glucose: Secondary | ICD-10-CM

## 2015-01-15 DIAGNOSIS — R7303 Prediabetes: Secondary | ICD-10-CM

## 2015-01-15 DIAGNOSIS — E7800 Pure hypercholesterolemia, unspecified: Secondary | ICD-10-CM

## 2015-01-15 DIAGNOSIS — M810 Age-related osteoporosis without current pathological fracture: Secondary | ICD-10-CM

## 2015-01-15 LAB — COMPREHENSIVE METABOLIC PANEL
ALT: 19 U/L (ref 0–35)
AST: 16 U/L (ref 0–37)
Albumin: 4 g/dL (ref 3.5–5.2)
Alkaline Phosphatase: 66 U/L (ref 39–117)
BUN: 19 mg/dL (ref 6–23)
CO2: 27 mEq/L (ref 19–32)
Calcium: 9.5 mg/dL (ref 8.4–10.5)
Chloride: 108 mEq/L (ref 96–112)
Creatinine, Ser: 0.97 mg/dL (ref 0.40–1.20)
GFR: 60.51 mL/min (ref >60.00)
Glucose, Bld: 97 mg/dL (ref 70–99)
Potassium: 4.3 mEq/L (ref 3.5–5.1)
Sodium: 141 mEq/L (ref 135–145)
Total Bilirubin: 0.2 mg/dL (ref 0.2–1.2)
Total Protein: 6.9 g/dL (ref 6.0–8.3)

## 2015-01-15 LAB — LIPID PANEL
Cholesterol: 176 mg/dL (ref 0–200)
HDL: 54.9 mg/dL (ref >39.00)
LDL Cholesterol: 99 mg/dL (ref 0–99)
NonHDL: 120.71
Total CHOL/HDL Ratio: 3
Triglycerides: 108 mg/dL (ref 0.0–149.0)
VLDL: 21.6 mg/dL (ref 0.0–40.0)

## 2015-01-15 LAB — VITAMIN D 25 HYDROXY (VIT D DEFICIENCY, FRACTURES): VITD: 30.06 ng/mL (ref 30.00–100.00)

## 2015-01-15 LAB — HEMOGLOBIN A1C: Hgb A1c MFr Bld: 6 % (ref 4.6–6.5)

## 2015-01-15 NOTE — Telephone Encounter (Signed)
-----   Message from Ellamae Sia sent at 01/08/2015  2:37 PM EDT ----- Regarding: Lab orders for Thursday, 8.18.16 Patient is scheduled for CPX labs, please order future labs, Thanks , Karna Christmas

## 2015-01-22 ENCOUNTER — Encounter: Payer: Self-pay | Admitting: Family Medicine

## 2015-01-22 ENCOUNTER — Ambulatory Visit (INDEPENDENT_AMBULATORY_CARE_PROVIDER_SITE_OTHER): Payer: Medicare Other | Admitting: Family Medicine

## 2015-01-22 VITALS — BP 124/80 | HR 83 | Temp 98.7°F | Ht <= 58 in | Wt 162.8 lb

## 2015-01-22 DIAGNOSIS — K219 Gastro-esophageal reflux disease without esophagitis: Secondary | ICD-10-CM | POA: Diagnosis not present

## 2015-01-22 DIAGNOSIS — Z23 Encounter for immunization: Secondary | ICD-10-CM

## 2015-01-22 DIAGNOSIS — Z0000 Encounter for general adult medical examination without abnormal findings: Secondary | ICD-10-CM | POA: Diagnosis not present

## 2015-01-22 DIAGNOSIS — E7800 Pure hypercholesterolemia, unspecified: Secondary | ICD-10-CM

## 2015-01-22 DIAGNOSIS — Z7189 Other specified counseling: Secondary | ICD-10-CM | POA: Insufficient documentation

## 2015-01-22 DIAGNOSIS — R7309 Other abnormal glucose: Secondary | ICD-10-CM | POA: Diagnosis not present

## 2015-01-22 DIAGNOSIS — M19049 Primary osteoarthritis, unspecified hand: Secondary | ICD-10-CM

## 2015-01-22 DIAGNOSIS — R7303 Prediabetes: Secondary | ICD-10-CM

## 2015-01-22 HISTORY — DX: Primary osteoarthritis, unspecified hand: M19.049

## 2015-01-22 MED ORDER — GABAPENTIN 300 MG PO CAPS: 600.0000 mg | ORAL_CAPSULE | Freq: Three times a day (TID) | ORAL | Status: DC

## 2015-01-22 MED ORDER — ALBUTEROL SULFATE HFA 108 (90 BASE) MCG/ACT IN AERS: 2.0000 | INHALATION_SPRAY | Freq: Four times a day (QID) | RESPIRATORY_TRACT | Status: DC | PRN

## 2015-01-22 NOTE — Assessment & Plan Note (Signed)
Well controlled on no med. 

## 2015-01-22 NOTE — Patient Instructions (Addendum)
Can try ibuprofen 800 mh three times a day for neuralgia pain. Call insurance about shingles and TDap coverage. Keep on wirking on healthy eating and regualr exercsie for weight loss goals.

## 2015-01-22 NOTE — Progress Notes (Signed)
HPI I have personally reviewed the Medicare Annual Wellness questionnaire and have noted 1. The patient's medical and social history 2. Their use of alcohol, tobacco or illicit drugs 3. Their current medications and supplements 4. The patient's functional ability including ADL's, fall risks, home safety risks and hearing or visual             impairment. 5. Diet and physical activities 6. Evidence for depression or mood disorders 7.         Updated provider list Cognitive evaluation was performed and recorded on pt medicare questionnaire form. The patients weight, height, BMI and visual acuity have been recorded in the chart  I have made referrals, counseling and provided education to the patient based review of the above and I have provided the pt with a written personalized care plan for preventive services.   Chronic bronchitis: Followed by Dr. Annamaria Boots. Plan-nebulizer bronchodilator, Depo-Medrol, sample Breo 200, refill albuterol rescue inhaler with discussion, doxycycline  Seeing [facility] for right CMC osteoarthritis, s/p steroid injection   Elevated Cholesterol: At goal LDL < 130 not on any medication.  Lab Results  Component Value Date   CHOL 176 01/15/2015   HDL 54.90 01/15/2015   LDLCALC 99 01/15/2015   LDLDIRECT 158.7 10/27/2011   TRIG 108.0 01/15/2015   CHOLHDL 3 01/15/2015  Using medications without problems: None  Muscle aches: None  Diet compliance: moderate  Exercise: Going on walks.  Other complaints:  Wt Readings from Last 3 Encounters:  01/22/15 162 lb 12 oz (73.823 kg)  11/13/14 164 lb (74.39 kg)  10/16/14 165 lb 8 oz (75.07 kg)    BP Readings from Last 3 Encounters:  11/13/14 96/80  10/16/14 137/71  05/13/14 128/80   Prediabetes: improved. Lab Results  Component Value Date   HGBA1C 6.0 01/15/2015   Hx of DCIS of the right breast status post lumpectomy.  Now tamoxifen x on yr 2/5  Dr. Sondra Come radiation onc.  Dr. Humphrey Rolls is oncologist.  Reviewed last [place] . Dr. Emmie Niemann in surgeon.   GERD: Stable control, rarely symptomatic.   Post herpetic neuralgia: Pain is fairly well controlled on gabapentin and rarely using amitryptiline. Not using any additional pain med.  Occ having recurrence of pain. Recommended ibuprofen to treat 800 mg TID.   Review of Systems  Constitutional: Negative for fever, fatigue and unexpected weight change.  HENT: Negative for ear pain, congestion, sore throat, sneezing, trouble swallowing and sinus pressure.  Eyes: Negative for pain and itching.  Respiratory: Negative for cough, shortness of breath and wheezing.  Cardiovascular: Negative for chest pain, palpitations and leg swelling.  Gastrointestinal: Negative for nausea, abdominal pain, diarrhea, constipation and blood in stool.  Genitourinary: Negative for dysuria, hematuria, vaginal discharge, difficulty urinating and menstrual problem.  Skin: Negative for rash.  Neurological: Negative for syncope, weakness, light-headedness, numbness and headaches.  Psychiatric/Behavioral: Negative for confusion and dysphoric mood. The patient is not nervous/anxious.  Objective:   Physical Exam  Constitutional: Vital signs are normal. She appears well-developed and well-nourished. She is cooperative. Non-toxic appearance. She does not appear ill. No distress.  HENT:  Head: Normocephalic.  Right Ear: Hearing, tympanic membrane, external ear and ear canal normal.  Left Ear: Hearing, tympanic membrane, external ear and ear canal normal.  Nose: Nose normal.  Eyes: Conjunctivae, EOM and lids are normal. Pupils are equal, round, and reactive to light. No foreign bodies found.  Neck: Trachea normal and normal range of motion. Neck supple. Carotid bruit is not present.  No mass and no thyromegaly present.  Cardiovascular: Normal rate, regular rhythm, S1 normal, S2 normal, normal heart sounds and intact distal pulses. Exam reveals no gallop.  No  murmur heard.  Pulmonary/Chest: Effort normal and breath sounds normal. No respiratory distress. She has no wheezes. She has no rhonchi. She has no rales.  Abdominal: Soft. Normal appearance and bowel sounds are normal. She exhibits no distension, no fluid wave, no abdominal bruit and no mass. There is no hepatosplenomegaly. There is no tenderness. There is no rebound, no guarding and no CVA tenderness. No hernia.  Lymphadenopathy:  She has no cervical adenopathy.  She has no axillary adenopathy.  Neurological: She is alert. She has normal strength. No cranial nerve deficit or sensory deficit.  Skin: Skin is warm, dry and intact. No rash noted.  Psychiatric: Her speech is normal and behavior is normal. Judgment normal. Her mood appears not anxious. Cognition and memory are normal. She does not exhibit a depressed mood.  Assessment & Plan:   The patient's preventative maintenance and recommended screening tests for an annual wellness exam were reviewed in full today.  Brought up to date unless services declined.  Counselled on the importance of diet, exercise, and its role in overall health and mortality.  The patient's FH and SH was reviewed, including their home life, tobacco status, and drug and alcohol status.    Vaccines: Uptodate with pneumovax and prevnar consider TDAP, shingles coverage. Given  flu. 04/2012 Dr. Carlean Purl, colonoscopy 5 adenomas max 6 mm Repeat colon 04/2017  DVE/PAP: Dr. Gertie Fey, GYN last pelvic [planned this year.  Mammo: Breast cancer followed by Dr. Lindi Adie, last mammo 07/2014 DEXA:01/2014; osteoporosis, no change from 2007 on tamoxifen, fosamax not needed. Repeat in 2 years. Non smoker, sig second hand some Hearing loss in right ear. Dr. Warren Danes.

## 2015-01-22 NOTE — Assessment & Plan Note (Signed)
Encouraged exercise, weight loss, healthy eating habits. ? ?

## 2015-01-22 NOTE — Addendum Note (Signed)
Addended by: Carter Kitten on: 01/22/2015 03:04 PM   Modules accepted: Orders

## 2015-01-22 NOTE — Assessment & Plan Note (Signed)
Stable control. 

## 2015-01-22 NOTE — Progress Notes (Signed)
Pre visit review using our clinic review tool, if applicable. No additional management support is needed unless otherwise documented below in the visit note. 

## 2015-02-06 ENCOUNTER — Telehealth: Payer: Self-pay

## 2015-02-06 MED ORDER — GABAPENTIN 300 MG PO CAPS: ORAL_CAPSULE | ORAL | Status: DC

## 2015-02-06 NOTE — Telephone Encounter (Signed)
Pt was seen for annual exam on 01/22/15; ibuprofen was not helping neuralgia pain from nerve damage where pt had shingles on face in 2000 and pain in tongue. Pt taking gabapentin 300 mg two caps tid. If pain is bad in tongue pt will take 2 extra gabapentin. Pt wants to know if Dr Diona Browner wants pt to continue taking the 2 extra gabapentin or what other med can pt take for pain in tongue which causes difficulty for pt to eat food. [facility]. Pt request cb.

## 2015-02-06 NOTE — Telephone Encounter (Signed)
Mrs. Marrufo notified okay to take the 2 extra gabapentin per Dr. Diona Browner.  New Prescription sent to [facility] with new directions and quantity.

## 2015-02-06 NOTE — Telephone Encounter (Signed)
Okay to take extra gabapentin, refill with new rx as pt taking.

## 2015-04-10 ENCOUNTER — Telehealth: Payer: Self-pay | Admitting: Hematology and Oncology

## 2015-04-10 NOTE — Telephone Encounter (Signed)
Patient called in to reschedule her appointments °

## 2015-04-14 ENCOUNTER — Ambulatory Visit: Payer: Medicare Other | Admitting: Hematology and Oncology

## 2015-04-20 NOTE — Assessment & Plan Note (Signed)
Right breast DCIS status post lumpectomy 01/26/2012 followed by adjuvant radiation and currently on tamoxifen since 05/08/2012, ER 100%, PR 100%, 1 cm DCIS, low-grade  Tamoxifen toxicities: 1. Mild hot flashes which have since resolved 2. Mild myalgias  Breast Cancer Surveillance: 1. Breast exam 04/21/2015: Normal 2. Mammogram 08/18/2014 No abnormalities. Postsurgical changes. Breast Density Category B. I recommended that she get 3-D mammograms for surveillance. Discussed the differences between different breast density categories.  Return to clinic in 1 year for follow-up

## 2015-04-21 ENCOUNTER — Encounter: Payer: Self-pay | Admitting: Hematology and Oncology

## 2015-04-21 ENCOUNTER — Telehealth: Payer: Self-pay | Admitting: Hematology and Oncology

## 2015-04-21 ENCOUNTER — Ambulatory Visit (HOSPITAL_BASED_OUTPATIENT_CLINIC_OR_DEPARTMENT_OTHER): Payer: Medicare Other | Admitting: Hematology and Oncology

## 2015-04-21 VITALS — BP 129/75 | HR 89 | Temp 98.4°F | Resp 18 | Ht <= 58 in | Wt 164.8 lb

## 2015-04-21 DIAGNOSIS — D0591 Unspecified type of carcinoma in situ of right breast: Secondary | ICD-10-CM

## 2015-04-21 DIAGNOSIS — D0510 Intraductal carcinoma in situ of unspecified breast: Secondary | ICD-10-CM | POA: Diagnosis not present

## 2015-04-21 MED ORDER — TAMOXIFEN CITRATE 20 MG PO TABS: ORAL_TABLET | ORAL | Status: DC

## 2015-04-21 NOTE — Telephone Encounter (Signed)
Appointments made and avs printed for patient °

## 2015-04-21 NOTE — Progress Notes (Signed)
Patient Care Team: [name], MD as PCP - General  DIAGNOSIS: No matching staging information was found for the patient.  SUMMARY OF ONCOLOGIC HISTORY:   Cancer of right breast, stage 0   01/10/2012 Initial Diagnosis Right breast biopsy: DCIS mammogram ultrasound 9 mm abnormality ER 100% PR 100%   01/26/2012 Surgery Right lumpectomy: DCIS low-grade 1 cm, with a ledge, margins negative, 0/3 sentinel nodes negative, ER 100%, PR 100% stage 0   02/27/2012 - 04/04/2012 Radiation Therapy Adjuvant radiation therapy   05/08/2012 -  Anti-estrogen oral therapy Tamoxifen 20 mg daily    CHIEF COMPLIANT:  Follow-up on tamoxifen  INTERVAL HISTORY: Katie Zimmerman is a  69 year old with above-mentioned history right breast cancer treated with lumpectomy and radiation is currently on tamoxifen. She appears to be tolerating it fairly well. Does not have any hot flashes or myalgias. She had a right breast and right axilla mammogram evaluation  In May 2016 for some discomfort and was found to be normal.  REVIEW OF SYSTEMS:   Constitutional: Denies fevers, chills or abnormal weight loss Eyes: Denies blurriness of vision Ears, nose, mouth, throat, and face: Denies mucositis or sore throat Respiratory: Denies cough, dyspnea or wheezes Cardiovascular: Denies palpitation, chest discomfort or lower extremity swelling Gastrointestinal:  Denies nausea, heartburn or change in bowel habits Skin: Denies abnormal skin rashes Lymphatics: Denies new lymphadenopathy or easy bruising Neurological:Denies numbness, tingling or new weaknesses Behavioral/Psych: Mood is stable, no new changes  Breast:  denies any pain or lumps or nodules in either breasts All other systems were reviewed with the patient and are negative.  I have reviewed the past medical history, past surgical history, social history and family history with the patient and they are unchanged from previous note.  ALLERGIES:  is allergic to penicillins and  sulfonamide derivatives.  MEDICATIONS:  Current Outpatient Prescriptions  Medication Sig Dispense Refill  . albuterol (PROVENTIL HFA;VENTOLIN HFA) 108 (90 BASE) MCG/ACT inhaler Inhale 2 puffs into the lungs every 6 (six) hours as needed for wheezing. 1 Inhaler prn  . amitriptyline (ELAVIL) 25 MG tablet Take 1 tablet (25 mg total) by mouth at bedtime. Increase to 2 tabs daily, may increase further to 75-100 mg daily if no side effects but not reached maximal benefit. 90 tablet 0  . benzonatate (TESSALON) 200 MG capsule Take 1 capsule (200 mg total) by mouth 2 (two) times daily as needed for cough. 30 capsule 2  . calcium carbonate (OS-CAL) 600 MG TABS tablet Take 600 mg by mouth 2 (two) times daily with a meal.    . Fluticasone Furoate-Vilanterol 100-25 MCG/INH AEPB 1 puff then rinse mouth, once daily 60 each prn  . gabapentin (NEURONTIN) 300 MG capsule Take 2 capsules in the morning, 2 capsules at lunch and 4 capsules at bedtime. 240 capsule 5  . tamoxifen (NOLVADEX) 20 MG tablet TAKE 1 TABLET (20 MG TOTAL) BY MOUTH DAILY. 90 tablet 3  . vitamin C (ASCORBIC ACID) 500 MG tablet Take 500 mg by mouth daily.    . Vitamin D, Ergocalciferol, (DRISDOL) 50000 UNITS CAPS capsule Take 50,000 Units by mouth. Take 500 units 2 times a day     No current facility-administered medications for this visit.    PHYSICAL EXAMINATION: ECOG PERFORMANCE STATUS: 0 - Asymptomatic  Filed Vitals:   04/21/15 1501  BP: 129/75  Pulse: 89  Temp: 98.4 F (36.9 C)  Resp: 18   Filed Weights   04/21/15 1501  Weight: 164 lb  12.8 oz (74.753 kg)    GENERAL:alert, no distress and comfortable SKIN: skin color, texture, turgor are normal, no rashes or significant lesions EYES: normal, Conjunctiva are pink and non-injected, sclera clear OROPHARYNX:no exudate, no erythema and lips, buccal mucosa, and tongue normal  NECK: supple, thyroid normal size, non-tender, without nodularity LYMPH:  no palpable lymphadenopathy in  the cervical, axillary or inguinal LUNGS: clear to auscultation and percussion with normal breathing effort HEART: regular rate & rhythm and no murmurs and no lower extremity edema ABDOMEN:abdomen soft, non-tender and normal bowel sounds Musculoskeletal:no cyanosis of digits and no clubbing  NEURO: alert & oriented x 3 with fluent speech, no focal motor/sensory deficits BREAST: No palpable masses or nodules in either right or left breasts. No palpable axillary supraclavicular or infraclavicular adenopathy.  Tenderness is noted in the right breast towards the axilla but no palpable nodularity. (exam performed in the presence of a chaperone)  LABORATORY DATA:  I have reviewed the data as listed   Chemistry      Component Value Date/Time   NA 141 01/15/2015 0833   NA 144 02/20/2014 1133   K 4.3 01/15/2015 0833   K 3.9 02/20/2014 1133   CL 108 01/15/2015 0833   CL 110* 11/12/2012 1216   CO2 27 01/15/2015 0833   CO2 26 02/20/2014 1133   BUN 19 01/15/2015 0833   BUN 15.5 02/20/2014 1133   CREATININE 0.97 01/15/2015 0833   CREATININE 0.9 02/20/2014 1133      Component Value Date/Time   CALCIUM 9.5 01/15/2015 0833   CALCIUM 9.5 02/20/2014 1133   ALKPHOS 66 01/15/2015 0833   ALKPHOS 73 02/20/2014 1133   AST 16 01/15/2015 0833   AST 19 02/20/2014 1133   ALT 19 01/15/2015 0833   ALT 21 02/20/2014 1133   BILITOT 0.2 01/15/2015 0833   BILITOT 0.34 02/20/2014 1133       Lab Results  Component Value Date   WBC 7.5 02/20/2014   HGB 12.5 02/20/2014   HCT 38.8 02/20/2014   MCV 91.7 02/20/2014   PLT 278 02/20/2014   NEUTROABS 4.4 02/20/2014   ASSESSMENT & PLAN:  Cancer of right breast, stage 0 Right breast DCIS status post lumpectomy 01/26/2012 followed by adjuvant radiation and currently on tamoxifen since 05/08/2012, ER 100%, PR 100%, 1 cm DCIS, low-grade  Tamoxifen toxicities: 1. Mild hot flashes which have since resolved 2. Mild myalgias  Breast Cancer Surveillance: 1.  Breast exam 04/21/2015: Normal 2. Mammogram 08/18/2014 No abnormalities. Postsurgical changes. Breast Density Category B. I recommended that she get 3-D mammograms for surveillance. Discussed the differences between different breast density categories.  Return to clinic in 1 year for follow-up     No orders of the defined types were placed in this encounter.   The patient has a good understanding of the overall plan. she agrees with it. she will call with any problems that may develop before the next visit here.   Rulon Eisenmenger, MD 04/21/2015

## 2015-05-18 ENCOUNTER — Ambulatory Visit: Payer: Medicare Other | Admitting: Internal Medicine

## 2015-05-20 ENCOUNTER — Encounter: Payer: Self-pay | Admitting: Internal Medicine

## 2015-05-29 ENCOUNTER — Other Ambulatory Visit: Payer: Self-pay | Admitting: Internal Medicine

## 2015-06-02 ENCOUNTER — Telehealth: Payer: Self-pay | Admitting: Family Medicine

## 2015-06-02 NOTE — Telephone Encounter (Signed)
Patient Name: Katie Zimmerman  DOB: Apr 27, 1946    Initial Comment Caller states has sore throat and it is going into her chest. Coughing up green mucus    Nurse Assessment  Nurse: Leilani Merl, RN, Heather Date/Time (Eastern Time): 06/02/2015 11:21:20 AM  Confirm and document reason for call. If symptomatic, describe symptoms. ---Caller states has sore throat and it is going into her chest. Coughing up green mucus , This started on Sunday  Has the patient traveled out of the country within the last 30 days? ---Not Applicable  Does the patient have any new or worsening symptoms? ---Yes  Will a triage be completed? ---Yes  Related visit to physician within the last 2 weeks? ---No  Does the PT have any chronic conditions? (i.e. diabetes, asthma, etc.) ---Yes  List chronic conditions. ---chronic asthmatic bronchitis  Is this a behavioral health or substance abuse call? ---No     Guidelines    Guideline Title Affirmed Question Affirmed Notes  Cough - Acute Productive SEVERE coughing spells (e.g., whooping sound after coughing, vomiting after coughing)    Final Disposition User   See Physician within [place], RN, Conservator, museum/gallery states that she does not have anyone to take her in to the office to be seen, she wants medication called in.   Referrals  GO TO FACILITY REFUSED   Disagree/Comply: Disagree  Disagree/Comply Reason: Disagree with instructions

## 2015-06-02 NOTE — Telephone Encounter (Signed)
Spoke to pt who believes she needs an abx. appt sched with [facility] for 1/5

## 2015-06-02 NOTE — Telephone Encounter (Signed)
Needs appointment to be seen to determine if antibiotics are indicated if not improving in 3-4 days. If shortness of breath or wheeze needs to be seen ASAP.  She can try  mucinex DM during the day and if interested in cough suppressant at night to help her sleep.. I can send one in.

## 2015-06-04 ENCOUNTER — Ambulatory Visit: Payer: Medicare Other | Admitting: Internal Medicine

## 2015-06-08 ENCOUNTER — Telehealth: Payer: Self-pay | Admitting: Internal Medicine

## 2015-06-08 MED ORDER — AZITHROMYCIN 250 MG PO TABS: ORAL_TABLET | ORAL | Status: DC

## 2015-06-08 MED ORDER — PROMETHAZINE-CODEINE 6.25-10 MG/5ML PO SYRP: 5.0000 mL | ORAL_SOLUTION | Freq: Four times a day (QID) | ORAL | Status: DC | PRN

## 2015-06-08 NOTE — Telephone Encounter (Signed)
Spoke with pt. She c/o prod cough (green phlem), Slight wheezing, chest tx, PND,. Nasal cong x 1 week. Pt has been taking benzonatate (helps very little), tussin cough syrup, OTC cold and flu caps. Please advise Dr. Annamaria Boots thanks  Allergies  Allergen Reactions  . Penicillins     REACTION: throat swells and rash  . Sulfonamide Derivatives     REACTION: rash     Current Outpatient Prescriptions on File Prior to Visit  Medication Sig Dispense Refill  . albuterol (PROVENTIL HFA;VENTOLIN HFA) 108 (90 BASE) MCG/ACT inhaler Inhale 2 puffs into the lungs every 6 (six) hours as needed for wheezing. 1 Inhaler prn  . amitriptyline (ELAVIL) 25 MG tablet Take 1 tablet (25 mg total) by mouth at bedtime. Increase to 2 tabs daily, may increase further to 75-100 mg daily if no side effects but not reached maximal benefit. 90 tablet 0  . benzonatate (TESSALON) 200 MG capsule Take 1 capsule (200 mg total) by mouth 2 (two) times daily as needed for cough. 30 capsule 2  . BREO ELLIPTA 100-25 MCG/INH AEPB INHALE ONE PUFF BY MOUTH THEN RINSE MOUTH DAILY 60 each 2  . calcium carbonate (OS-CAL) 600 MG TABS tablet Take 600 mg by mouth 2 (two) times daily with a meal.    . gabapentin (NEURONTIN) 300 MG capsule Take 2 capsules in the morning, 2 capsules at lunch and 4 capsules at bedtime. 240 capsule 5  . tamoxifen (NOLVADEX) 20 MG tablet TAKE 1 TABLET (20 MG TOTAL) BY MOUTH DAILY. 90 tablet 3  . vitamin C (ASCORBIC ACID) 500 MG tablet Take 500 mg by mouth daily.    . Vitamin D, Ergocalciferol, (DRISDOL) 50000 UNITS CAPS capsule Take 50,000 Units by mouth. Take 500 units 2 times a day     No current facility-administered medications on file prior to visit.

## 2015-06-08 NOTE — Telephone Encounter (Signed)
Spoke with pt, aware of recs.  rx's sent to preferred pharmacy.  Nothing further needed.  

## 2015-06-08 NOTE — Telephone Encounter (Signed)
Offer Z pak If she wants a cough syrup, ok to give prometh codeine 200 ml,   5 ml every 6 hours prn cough

## 2015-07-20 ENCOUNTER — Other Ambulatory Visit: Payer: Self-pay | Admitting: Hematology and Oncology

## 2015-07-20 DIAGNOSIS — Z853 Personal history of malignant neoplasm of breast: Secondary | ICD-10-CM

## 2015-07-21 ENCOUNTER — Other Ambulatory Visit: Payer: Self-pay | Admitting: Family Medicine

## 2015-07-21 NOTE — Telephone Encounter (Signed)
Last office visit 01/22/2015.  Last refilled 02/06/15 for #240 with 5 refills.  Ok to refill?

## 2015-08-06 ENCOUNTER — Telehealth: Payer: Self-pay | Admitting: Internal Medicine

## 2015-08-06 MED ORDER — AZITHROMYCIN 250 MG PO TABS: ORAL_TABLET | ORAL | Status: DC

## 2015-08-06 MED ORDER — PROMETHAZINE-CODEINE 6.25-10 MG/5ML PO SYRP: 5.0000 mL | ORAL_SOLUTION | Freq: Four times a day (QID) | ORAL | Status: DC | PRN

## 2015-08-06 NOTE — Telephone Encounter (Signed)
LM for pt x 1  

## 2015-08-06 NOTE — Telephone Encounter (Signed)
Pt requesting a sooner appt than 08/17/15 Pt c/o worsening cough since January- deep chest cough Pt reports having coughing spasms/spells at times Quency Tober mucus production, wheezing and chest tightness.  Feels that the Breo helps some but is not getting total relief of cough and symptoms.  Please advise Dr Annamaria Boots. Thanks.  Allergies  Allergen Reactions  . Penicillins     REACTION: throat swells and rash  . Sulfonamide Derivatives     REACTION: rash     Medication List       This list is accurate as of: 08/06/15  4:03 PM.  Always use your most recent med list.               albuterol 108 (90 Base) MCG/ACT inhaler  Commonly known as:  PROVENTIL HFA;VENTOLIN HFA  Inhale 2 puffs into the lungs every 6 (six) hours as needed for wheezing.     amitriptyline 25 MG tablet  Commonly known as:  ELAVIL  Take 1 tablet (25 mg total) by mouth at bedtime. Increase to 2 tabs daily, may increase further to 75-100 mg daily if no side effects but not reached maximal benefit.     azithromycin 250 MG tablet  Commonly known as:  ZITHROMAX Z-PAK  Take 2 tabs by mouth today, then 1 tab daily until gone.     benzonatate 200 MG capsule  Commonly known as:  TESSALON  Take 1 capsule (200 mg total) by mouth 2 (two) times daily as needed for cough.     BREO ELLIPTA 100-25 MCG/INH Aepb  Generic drug:  fluticasone furoate-vilanterol  INHALE ONE PUFF BY MOUTH THEN RINSE MOUTH DAILY     calcium carbonate 600 MG Tabs tablet  Commonly known as:  OS-CAL  Take 600 mg by mouth 2 (two) times daily with a meal.     gabapentin 300 MG capsule  Commonly known as:  NEURONTIN  TAKE TWO CAPSULE BY MOUTH IN THE MORNING AND AT LUNCH, AND FOUR CAPSULE at bedtime     promethazine-codeine 6.25-10 MG/5ML syrup  Commonly known as:  PHENERGAN with CODEINE  Take 5 mLs by mouth every 6 (six) hours as needed for cough.     tamoxifen 20 MG tablet  Commonly known as:  NOLVADEX  TAKE 1 TABLET (20 MG TOTAL) BY MOUTH DAILY.      vitamin C 500 MG tablet  Commonly known as:  ASCORBIC ACID  Take 500 mg by mouth daily.     Vitamin D (Ergocalciferol) 50000 units Caps capsule  Commonly known as:  DRISDOL  Take 50,000 Units by mouth. Take 500 units 2 times a day

## 2015-08-06 NOTE — Telephone Encounter (Signed)
Offer Zpak and prometh codeine cough syrup 200 ml,   5 ml every 6 hours as needed  Maybe we can get her in with NP tomorrow?

## 2015-08-06 NOTE — Telephone Encounter (Signed)
Spoke with the pt and notified of recs per CDY  She verbalized understanding  Nothing further needed  Rxs were sent to pharm

## 2015-08-10 DIAGNOSIS — D0510 Intraductal carcinoma in situ of unspecified breast: Secondary | ICD-10-CM | POA: Diagnosis not present

## 2015-08-17 ENCOUNTER — Ambulatory Visit (INDEPENDENT_AMBULATORY_CARE_PROVIDER_SITE_OTHER): Payer: Medicare Other | Admitting: Internal Medicine

## 2015-08-17 ENCOUNTER — Encounter: Payer: Self-pay | Admitting: Internal Medicine

## 2015-08-17 ENCOUNTER — Ambulatory Visit (INDEPENDENT_AMBULATORY_CARE_PROVIDER_SITE_OTHER)
Admission: RE | Admit: 2015-08-17 | Discharge: 2015-08-17 | Disposition: A | Discharge status: Home / Self Care | Payer: Medicare Other | Source: Ambulatory Visit | Attending: Internal Medicine | Admitting: Internal Medicine

## 2015-08-17 VITALS — BP 132/82 | HR 70 | Ht <= 58 in | Wt 166.0 lb

## 2015-08-17 DIAGNOSIS — R05 Cough: Secondary | ICD-10-CM

## 2015-08-17 DIAGNOSIS — R059 Cough, unspecified: Secondary | ICD-10-CM

## 2015-08-17 DIAGNOSIS — J209 Acute bronchitis, unspecified: Secondary | ICD-10-CM | POA: Diagnosis not present

## 2015-08-17 DIAGNOSIS — D0591 Unspecified type of carcinoma in situ of right breast: Secondary | ICD-10-CM

## 2015-08-17 DIAGNOSIS — J42 Unspecified chronic bronchitis: Secondary | ICD-10-CM

## 2015-08-17 DIAGNOSIS — R0602 Shortness of breath: Secondary | ICD-10-CM | POA: Diagnosis not present

## 2015-08-17 MED ORDER — METHYLPREDNISOLONE ACETATE 80 MG/ML IJ SUSP
80.0000 mg | Freq: Once | INTRAMUSCULAR | Status: AC
  Administered 2015-08-17: 80 mg via INTRAMUSCULAR

## 2015-08-17 MED ORDER — BENZONATATE 200 MG PO CAPS: 200.0000 mg | ORAL_CAPSULE | Freq: Two times a day (BID) | ORAL | Status: DC | PRN

## 2015-08-17 MED ORDER — LEVALBUTEROL HCL 0.63 MG/3ML IN NEBU
0.6300 mg | INHALATION_SOLUTION | Freq: Once | RESPIRATORY_TRACT | Status: AC
  Administered 2015-08-17: 0.63 mg via RESPIRATORY_TRACT

## 2015-08-17 MED ORDER — LEVOFLOXACIN 500 MG PO TABS: 500.0000 mg | ORAL_TABLET | Freq: Every day | ORAL | Status: DC

## 2015-08-17 NOTE — Assessment & Plan Note (Signed)
Sustained exacerbation after acute bronchitis in January. It sounds as if she may have had a couple of separate infections without ever having time to clear. Plan-Levaquin, Depo-Medrol, nebulizer Xopenex treatment, CXR

## 2015-08-17 NOTE — Patient Instructions (Signed)
Script sent for levaquin antibiotic  Script sent for benzonatate perles for cough- ok to use in between doses of cough syrup if needed  Order- neb xop 0.63     Dx chronic bronchitis with exacerbation              Depo 55  Order- CXR   Dx chronic bronchitis with exacerbation, hx R breast cancer

## 2015-08-17 NOTE — Assessment & Plan Note (Signed)
There may be some underlying radiation fibrosis on the right side. We will watch for that on CXR.

## 2015-08-17 NOTE — Progress Notes (Signed)
12/06/13- 67 yoF never smoker( hx prolonged second hand smoke) former [place] [facility] pt.w/ chronic bronchitis - Prod cough with green mucus x 1 1/2 mo, SOB when walking and outside, and chest tightness.  Complicated by breast CA, GERD Past hx chronic bronchitis, with pneumonia. Doing very well in recent years. Now increased cough x 6 weeks, non-specific onset. PCP saw in [place], tussionex, then benzonatate.Sputum still green. Some wheeze. Prefers albuterol HFA over Combivent. SOB in heat. Worse cough lying down. Denies fever, sweats, blood.  Hx R lumpectomy/ XRT. CXR 11/13/13- IMPRESSION:  1. Mild cardiomegaly.  2. No evidence for acute cardiopulmonary abnormality.  Electronically Signed  By: Shon Hale M.D.  On: 11/13/2013 14:52   02/10/14- 49 yoF never smoker( hx prolonged second hand smoke) former [place] [facility] pt.w/ chronic bronchitis .  Complicated by breast CA, GERD FOLLOWS FOR: Pt states she has improved since last visit, c/o sometimes prod cough with green/yellow mucus, SOB and chest tightness with exertion.     05/13/14-  68 yoF never smoker( hx prolonged second hand smoke) former [place] [facility] pt.w/ chronic bronchitis .  Complicated by breast CA, GERD FOLLOWS FOR:c/o cough x 2-3 wks.-dry,sob with exertion,no wheezing,no fcs,denies cp or tightness.Felt like Breo sample from last ov worked well. Husband here  11/13/14- 89 yoF never smoker( hx prolonged second hand smoke) former [facility] [facility] pt.w/ chronic bronchitis .  Complicated by hx R breast CA/ XRT, GERD Follows For: Pt c/o prod cough with yellow mucus, wheezing,  increased SOB with activity, chest congestion. Symptoms started on 11/10/14. Husband here We had called doxycycline and prednisone for an acute bronchitis in early May. She did well until 3 days ago-onset of the new bronchitis episode with wheeze, cough, yellow sputum. Denies sore throat, fever, swollen glands.  08/17/2015-70 year old female never smoker (history prolonged second hand  smoke) followed for chronic bronchitis, complicated by R breast cancer/XRT, GERD Follows for: Chronic Bronchitis. Pt c/o cough with yellow/green mucus, some wheeze and SOB when coughing a lot and some chest tightness. Pt denies CP/f/n/v. Pt has finished Zpak but is still using the cough syrup.  She has never really cleared following an acute bronchitis with fever and purulent sputum in January. Has had Z-Pak twice. Sputum still dark but scant. Chest rattles with some wheeze. No more fever and no adenopathy.  ROS-see HPI Constitutional:   No-   weight loss, night sweats, fevers, chills, fatigue, lassitude. HEENT:   No-  headaches, difficulty swallowing, tooth/dental problems, sore throat,       No-  sneezing, itching, ear ache, nasal congestion, post nasal drip,  CV:  No-   chest pain, orthopnea, PND, swelling in lower extremities, anasarca,                                                         dizziness, palpitations Resp: +shortness of breath with exertion or at rest.              +-productive cough,  + non-productive cough,  No- coughing up of blood.            +change in color of mucus.  + wheezing.   Skin: No-   rash or lesions. GI:  No-   heartburn, indigestion, abdominal pain, nausea, vomiting, diarrhea,  change in bowel habits, loss of appetite GU:  MS:  No-   joint pain or swelling.   Neuro-     nothing unusual Psych:  No- change in mood or affect. No depression or anxiety.  No memory loss.  OBJ- Physical Exam General- Alert, Oriented, Affect-appropriate, Distress- none acute, overweight Skin- rash-none, lesions- none, excoriation- none Lymphadenopathy- none Head- atraumatic            Eyes- Gross vision intact, PERRLA, conjunctivae and secretions clear            Ears- Hearing, canals-normal            Nose- Clear, no-Septal dev, mucus, polyps, erosion, perforation             Throat- Mallampati III , mucosa clear , drainage- none, tonsils- atrophic Neck-  flexible , trachea midline, no stridor , thyroid nl, carotid no bruit Chest - symmetrical excursion , unlabored           Heart/CV- RRR , no murmur , no gallop  , no rub, nl s1 s2                           - JVD- none , edema- none, stasis changes- none, varices- none           Lung- clear to P&A, wheeze+, cough+ , dullness-none, rub- none           Chest wall-  +Hx R lumpectomy/XRT Abd-  Br/ Gen/ Rectal- Not done, not indicated Extrem- cyanosis- none, clubbing, none, atrophy- none, strength- nl Neuro- grossly intact to observation

## 2015-08-18 DIAGNOSIS — H524 Presbyopia: Secondary | ICD-10-CM | POA: Diagnosis not present

## 2015-09-03 ENCOUNTER — Telehealth: Payer: Self-pay | Admitting: Internal Medicine

## 2015-09-03 MED ORDER — DOXYCYCLINE HYCLATE 100 MG PO TABS: 100.0000 mg | ORAL_TABLET | Freq: Two times a day (BID) | ORAL | Status: DC

## 2015-09-03 NOTE — Telephone Encounter (Signed)
Doxycycline 100 mg, # 14, 1 twice daily 

## 2015-09-03 NOTE — Telephone Encounter (Signed)
Ok to refill 

## 2015-09-03 NOTE — Telephone Encounter (Signed)
Pt aware that cough syrup will be called into pharmacy.  Promethazine-Codeine #233mL, Sig: Take 5 mLs by mouth every 6 (six) hours as needed for cough. Nothing further needed.

## 2015-09-03 NOTE — Telephone Encounter (Signed)
Pt c/o worsening cough, wheeze, SOB and fever since last OV. Pt states that her cough went away for about 1 week - states that she got stuck in the rain Monday 08/31/15 and since then her cough has returned. Some mucus production yellow in color. Fever has been running about (100.2). Offered patient an appt today with CY - pt refused to schedule states that she would have to drive herself and doesn't feel well. Please advise Dr Annamaria Boots. Thanks.   Allergies  Allergen Reactions  . Penicillins     REACTION: throat swells and rash  . Sulfonamide Derivatives     REACTION: rash     Medication List       This list is accurate as of: 09/03/15  9:28 AM.  Always use your most recent med list.               albuterol 108 (90 Base) MCG/ACT inhaler  Commonly known as:  PROVENTIL HFA;VENTOLIN HFA  Inhale 2 puffs into the lungs every 6 (six) hours as needed for wheezing.     benzonatate 200 MG capsule  Commonly known as:  TESSALON  Take 1 capsule (200 mg total) by mouth 2 (two) times daily as needed for cough.     BREO ELLIPTA 100-25 MCG/INH Aepb  Generic drug:  fluticasone furoate-vilanterol  INHALE ONE PUFF BY MOUTH THEN RINSE MOUTH DAILY     calcium carbonate 600 MG Tabs tablet  Commonly known as:  OS-CAL  Take 600 mg by mouth 2 (two) times daily with a meal.     gabapentin 300 MG capsule  Commonly known as:  NEURONTIN  TAKE TWO CAPSULE BY MOUTH IN THE MORNING AND AT LUNCH, AND FOUR CAPSULE at bedtime     ibuprofen 200 MG tablet  Commonly known as:  ADVIL,MOTRIN  Take 800 mg by mouth 3 (three) times daily.     levofloxacin 500 MG tablet  Commonly known as:  LEVAQUIN  Take 1 tablet (500 mg total) by mouth daily.     promethazine-codeine 6.25-10 MG/5ML syrup  Commonly known as:  PHENERGAN with CODEINE  Take 5 mLs by mouth every 6 (six) hours as needed for cough.     tamoxifen 20 MG tablet  Commonly known as:  NOLVADEX  TAKE 1 TABLET (20 MG TOTAL) BY MOUTH DAILY.     vitamin C 500  MG tablet  Commonly known as:  ASCORBIC ACID  Take 500 mg by mouth daily.     Vitamin D (Ergocalciferol) 50000 units Caps capsule  Commonly known as:  DRISDOL  Take 50,000 Units by mouth. Take 500 units 2 times a day

## 2015-09-03 NOTE — Telephone Encounter (Signed)
Pt aware that script has been called into pharmacy  Pt is requesting a refill of Promethazine-Codeine cough syrup.  Last filled 08/06/15 #213mL Please advise. Thanks.

## 2015-10-27 ENCOUNTER — Ambulatory Visit
Admission: RE | Admit: 2015-10-27 | Discharge: 2015-10-27 | Disposition: A | Discharge status: Home / Self Care | Payer: Medicare Other | Source: Ambulatory Visit | Attending: Hematology and Oncology | Admitting: Hematology and Oncology

## 2015-10-27 DIAGNOSIS — R928 Other abnormal and inconclusive findings on diagnostic imaging of breast: Secondary | ICD-10-CM | POA: Diagnosis not present

## 2015-10-27 DIAGNOSIS — Z853 Personal history of malignant neoplasm of breast: Secondary | ICD-10-CM

## 2015-11-20 ENCOUNTER — Other Ambulatory Visit: Payer: Self-pay | Admitting: Internal Medicine

## 2015-11-26 ENCOUNTER — Encounter: Payer: Self-pay | Admitting: Family Medicine

## 2015-11-26 ENCOUNTER — Ambulatory Visit (INDEPENDENT_AMBULATORY_CARE_PROVIDER_SITE_OTHER): Payer: Medicare Other | Admitting: Family Medicine

## 2015-11-26 VITALS — BP 130/90 | HR 70 | Temp 98.3°F | Ht <= 58 in | Wt 168.5 lb

## 2015-11-26 DIAGNOSIS — B0229 Other postherpetic nervous system involvement: Secondary | ICD-10-CM | POA: Diagnosis not present

## 2015-11-26 MED ORDER — VENLAFAXINE HCL ER 37.5 MG PO CP24: ORAL_CAPSULE | ORAL | Status: DC

## 2015-11-26 NOTE — Progress Notes (Signed)
Pre visit review using our clinic review tool, if applicable. No additional management support is needed unless otherwise documented below in the visit note. 

## 2015-11-26 NOTE — Assessment & Plan Note (Signed)
Continue neurontin.  Add venlafaxine.  Cymblata contraindicated with tamoxifen ( she will be off next year).  Follow up in 4 weeks.  If no improvement.. Try change to lyrica.

## 2015-11-26 NOTE — Patient Instructions (Signed)
Continue gabapentin.  Start venlafaxine and increase after 1 week to 2 capsules daily.

## 2015-11-26 NOTE — Progress Notes (Signed)
   Subjective:    Patient ID: Katie Zimmerman, female    DOB: 23-Nov-1945, 70 y.o.   MRN: [id number]  HPI  70 year old female presents with  jaw pain on right from post herpetic neuralgia.. Ongoing for years. Had shingles in 2000. PAin can fluctuates and flares over the years.  She reports pain in right jaw.. Radiating to right tounge. Pain can interfere with eating at times.  She has noted pain worse in last few weeks.  Gabapentin  high dose ( 2400 mg a day, has tried additional 900 mg in night to help pain keeping her from sleeping) and ibuprofen is not working.  Tried Lyrica in past when seeing [facility].  Had tried amitryptiline in past...made her very sleepy the next day.    Social History /Family History/Past Medical History reviewed and updated if needed.   Review of Systems  Constitutional: Negative for fever and fatigue.  HENT: Negative for ear pain.   Eyes: Negative for pain.  Respiratory: Negative for chest tightness and shortness of breath.   Cardiovascular: Negative for chest pain, palpitations and leg swelling.  Gastrointestinal: Negative for abdominal pain.  Genitourinary: Negative for dysuria.       Objective:   Physical Exam  Constitutional: Vital signs are normal. She appears well-developed and well-nourished. She is cooperative.  Non-toxic appearance. She does not appear ill. No distress.  HENT:  Head: Normocephalic.  Right Ear: Hearing, tympanic membrane, external ear and ear canal normal. Tympanic membrane is not erythematous, not retracted and not bulging.  Left Ear: Hearing, tympanic membrane, external ear and ear canal normal. Tympanic membrane is not erythematous, not retracted and not bulging.  Nose: No mucosal edema or rhinorrhea. Right sinus exhibits no maxillary sinus tenderness and no frontal sinus tenderness. Left sinus exhibits no maxillary sinus tenderness and no frontal sinus tenderness.  Mouth/Throat: Uvula is midline, oropharynx is clear  and moist and mucous membranes are normal.  Eyes: Conjunctivae, EOM and lids are normal. Pupils are equal, round, and reactive to light. Lids are everted and swept, no foreign bodies found.  Neck: Trachea normal and normal range of motion. Neck supple. Carotid bruit is not present. No thyroid mass and no thyromegaly present.  Cardiovascular: Normal rate, regular rhythm, S1 normal, S2 normal, normal heart sounds, intact distal pulses and normal pulses.  Exam reveals no gallop and no friction rub.   No murmur heard. Pulmonary/Chest: Effort normal and breath sounds normal. No tachypnea. No respiratory distress. She has no decreased breath sounds. She has no wheezes. She has no rhonchi. She has no rales.  Abdominal: Soft. Normal appearance and bowel sounds are normal. There is no tenderness.  Neurological: She is alert.   Atrophy of right masseter muscle.  Skin: Skin is warm, dry and intact. No rash noted.  Psychiatric: Her speech is normal and behavior is normal. Judgment and thought content normal. Her mood appears not anxious. Cognition and memory are normal. She does not exhibit a depressed mood.          Assessment & Plan:

## 2015-11-27 MED ORDER — VENLAFAXINE HCL ER 37.5 MG PO CP24: ORAL_CAPSULE | ORAL | Status: DC

## 2015-11-27 NOTE — Addendum Note (Signed)
Addended by: Carter Kitten on: 11/27/2015 08:51 AM   Modules accepted: Orders

## 2015-11-30 ENCOUNTER — Telehealth: Payer: Self-pay | Admitting: *Deleted

## 2015-11-30 NOTE — Telephone Encounter (Signed)
Katie Zimmerman with [facility] Medicare left v/m re: quantity limit for venlafaxine; request has been denied; denial letter and rights to appeal will be mailed to office.

## 2015-11-30 NOTE — Telephone Encounter (Signed)
Received fax from [facility] for Venlafaxine ER 37.5 mg.   Form completed and faxed back at [phone].  Awaiting decision.

## 2015-12-02 NOTE — Telephone Encounter (Addendum)
Spoke with pharmacist at [facility] and she states the insurance did approve the new Rx for Venlafaxine XR 37.5 mg, one capsule daily.

## 2015-12-07 ENCOUNTER — Telehealth: Payer: Self-pay

## 2015-12-07 NOTE — Telephone Encounter (Signed)
Duplicate/error

## 2015-12-07 NOTE — Telephone Encounter (Signed)
Pt called back; pt seen 11/26/15; pt started taking venlafaxine; after taking med pt would become very nauseated and nausea would last the entire day. 2nd day pt did not take med and did not have nausea; third day pt took med and again was nauseated the entire day; last time pt took med was 12/04/15 and was still nauseated on 12/06/15. Pt stated she felt like a zombie on 12/06/15.pt never vomited. Today pt feels better but not completely herself; pt is not going to take any more of the med and request cb. Pt is aware Dr Diona Browner is out of office today. [place].

## 2015-12-07 NOTE — Telephone Encounter (Signed)
Pt left v/m requesting cb. Left v/m requesting pt to cb. 

## 2015-12-08 MED ORDER — PREGABALIN 75 MG PO CAPS: 75.0000 mg | ORAL_CAPSULE | Freq: Two times a day (BID) | ORAL | Status: DC

## 2015-12-08 NOTE — Telephone Encounter (Signed)
Left message for Katie Zimmerman to return my call.

## 2015-12-08 NOTE — Telephone Encounter (Signed)
Spoke with Katie Zimmerman.  She is agreeable to trying Lyrica.  Please call in Rx to [facility].

## 2015-12-08 NOTE — Telephone Encounter (Signed)
Pt returned your call.  

## 2015-12-08 NOTE — Telephone Encounter (Signed)
Given not tolerating.. We can try a different med for post herpetic neuralgia.  Would she like to try lyrica in place of gabapentin?

## 2015-12-08 NOTE — Telephone Encounter (Signed)
Fax in lyrica prescription. HAve her start 1 cap BIDx 1 week then if symptoms not controlled and no SE  increase to 2 cap BID or 150 mg BID.  If not improving, make follow up appt in 2 weeks.

## 2015-12-09 NOTE — Telephone Encounter (Signed)
Pt returned you call  [phone]

## 2015-12-09 NOTE — Telephone Encounter (Addendum)
Rx faxed to [place] at [phone].  Left message for Ms. Sessums that the Lyrica Rx has been faxed to her pharmacy.  Instructed to start with 1 capsule BIDx 1 week then if symptoms are not controlled and no SE, then increase to 2 cap BID.  If not improving, make follow up appt in 2 weeks.

## 2015-12-09 NOTE — Telephone Encounter (Signed)
[name] notified as instructed by telephone.  She is already scheduled to see Dr. Diona Browner on 12/28/2015.  Advised to keep appointment as scheduled.

## 2015-12-21 ENCOUNTER — Other Ambulatory Visit: Payer: Self-pay | Admitting: Hematology and Oncology

## 2015-12-21 DIAGNOSIS — D0510 Intraductal carcinoma in situ of unspecified breast: Secondary | ICD-10-CM

## 2015-12-21 NOTE — Telephone Encounter (Signed)
Chart reviewed.

## 2015-12-28 ENCOUNTER — Ambulatory Visit: Payer: Medicare Other | Admitting: Family Medicine

## 2016-01-01 ENCOUNTER — Ambulatory Visit (INDEPENDENT_AMBULATORY_CARE_PROVIDER_SITE_OTHER): Payer: Medicare Other | Admitting: Family Medicine

## 2016-01-01 ENCOUNTER — Encounter: Payer: Self-pay | Admitting: Family Medicine

## 2016-01-01 DIAGNOSIS — B0229 Other postherpetic nervous system involvement: Secondary | ICD-10-CM

## 2016-01-01 MED ORDER — PREGABALIN 75 MG PO CAPS: ORAL_CAPSULE | ORAL | 5 refills | Status: DC

## 2016-01-01 NOTE — Patient Instructions (Addendum)
Increase Lyrica to 150 in AM and 225 mg at night. 2 tabs of 75 mg in AM and 3 tabs at PM. Keep appt as scheduled.

## 2016-01-01 NOTE — Progress Notes (Signed)
   Subjective:    Patient ID: Katie Zimmerman, female    DOB: August 02, 1945, 70 y.o.   MRN: [id number]   HPI   70 year old female presents for  4 week follow up post herpetic neuralgia after change from Neurontin to lyrica.  At [place] 11/21/2015: Gabapentin  high dose ( 2400 mg a day, has tried additional 900 mg in night to help pain keeping her from sleeping) and ibuprofen is not working. Had tried amitryptiline in past...made her very sleepy the next day.  She tried trial of venlafaxine ( tamoxifen along with cymbalta use is contraindicated)  But had to stop this due to nausea She is started  lyrica 75 mg BID x 1 week, then increased to 150 mg BID in last 2 weeks.  She reports during the day her pain control is good ( Pain scale 0-1/10) , at night she is still having pain in right jaw from PHN Pain scale (7-9/10)  Pain control is about the same as on her previous dose of gabapentin.  She denies and sedation or SE to lyrica.   Social History /Family History/Past Medical History reviewed and updated if needed.  Review of Systems  Constitutional: Negative for fatigue.  HENT: Negative for ear pain.   Eyes: Positive for pain and redness.       Has noted today slight irritation in corner of right eye, wears contacts.  Respiratory: Negative for shortness of breath.   Cardiovascular: Negative for leg swelling.       Objective:   Physical Exam  Constitutional: Vital signs are normal. She appears well-developed and well-nourished. She is cooperative.  Non-toxic appearance. She does not appear ill. No distress.  HENT:  Head: Normocephalic.  Right Ear: Hearing, tympanic membrane, external ear and ear canal normal. Tympanic membrane is not erythematous, not retracted and not bulging.  Left Ear: Hearing, tympanic membrane, external ear and ear canal normal. Tympanic membrane is not erythematous, not retracted and not bulging.  Nose: No mucosal edema or rhinorrhea. Right sinus exhibits no maxillary  sinus tenderness and no frontal sinus tenderness. Left sinus exhibits no maxillary sinus tenderness and no frontal sinus tenderness.  Mouth/Throat: Uvula is midline, oropharynx is clear and moist and mucous membranes are normal.  Eyes: Conjunctivae, EOM and lids are normal. Pupils are equal, round, and reactive to light. Lids are everted and swept, no foreign bodies found.  Neck: Trachea normal and normal range of motion. Neck supple. Carotid bruit is not present. No thyroid mass and no thyromegaly present.  Cardiovascular: Normal rate, regular rhythm, S1 normal, S2 normal, normal heart sounds, intact distal pulses and normal pulses.  Exam reveals no gallop and no friction rub.   No murmur heard. Pulmonary/Chest: Effort normal and breath sounds normal. No tachypnea. No respiratory distress. She has no decreased breath sounds. She has no wheezes. She has no rhonchi. She has no rales.  Abdominal: Soft. Normal appearance and bowel sounds are normal. There is no tenderness.  Neurological: She is alert. She has normal strength. No cranial nerve deficit or sensory deficit. She exhibits normal muscle tone. Gait normal.  Skin: Skin is warm, dry and intact. No rash noted.  Psychiatric: Her speech is normal and behavior is normal. Judgment and thought content normal. Her mood appears not anxious. Cognition and memory are normal. She does not exhibit a depressed mood.          Assessment & Plan:

## 2016-01-01 NOTE — Progress Notes (Signed)
Pre visit review using our clinic review tool, if applicable. No additional management support is needed unless otherwise documented below in the visit note. 

## 2016-01-01 NOTE — Assessment & Plan Note (Signed)
Stable control on lyrica. Will increase dose at night for better control.

## 2016-01-19 ENCOUNTER — Other Ambulatory Visit (INDEPENDENT_AMBULATORY_CARE_PROVIDER_SITE_OTHER): Payer: Medicare Other

## 2016-01-19 ENCOUNTER — Telehealth: Payer: Self-pay | Admitting: Family Medicine

## 2016-01-19 DIAGNOSIS — R7303 Prediabetes: Secondary | ICD-10-CM

## 2016-01-19 DIAGNOSIS — E7800 Pure hypercholesterolemia, unspecified: Secondary | ICD-10-CM

## 2016-01-19 DIAGNOSIS — Z1159 Encounter for screening for other viral diseases: Secondary | ICD-10-CM | POA: Diagnosis not present

## 2016-01-19 DIAGNOSIS — R7989 Other specified abnormal findings of blood chemistry: Secondary | ICD-10-CM | POA: Diagnosis not present

## 2016-01-19 DIAGNOSIS — M810 Age-related osteoporosis without current pathological fracture: Secondary | ICD-10-CM | POA: Diagnosis not present

## 2016-01-19 DIAGNOSIS — N200 Calculus of kidney: Secondary | ICD-10-CM

## 2016-01-19 LAB — LIPID PANEL
Cholesterol: 160 mg/dL (ref 0–200)
HDL: 42 mg/dL (ref >39.00)
NonHDL: 118.06
Total CHOL/HDL Ratio: 4
Triglycerides: 298 mg/dL — ABNORMAL HIGH (ref 0.0–149.0)
VLDL: 59.6 mg/dL — ABNORMAL HIGH (ref 0.0–40.0)

## 2016-01-19 LAB — VITAMIN D 25 HYDROXY (VIT D DEFICIENCY, FRACTURES): VITD: 24.88 ng/mL — ABNORMAL LOW (ref 30.00–100.00)

## 2016-01-19 LAB — COMPREHENSIVE METABOLIC PANEL
ALT: 18 U/L (ref 0–35)
AST: 16 U/L (ref 0–37)
Albumin: 3.9 g/dL (ref 3.5–5.2)
Alkaline Phosphatase: 71 U/L (ref 39–117)
BUN: 20 mg/dL (ref 6–23)
CO2: 25 mEq/L (ref 19–32)
Calcium: 8.9 mg/dL (ref 8.4–10.5)
Chloride: 108 mEq/L (ref 96–112)
Creatinine, Ser: 0.94 mg/dL (ref 0.40–1.20)
GFR: 62.56 mL/min (ref >60.00)
Glucose, Bld: 112 mg/dL — ABNORMAL HIGH (ref 70–99)
Potassium: 3.9 mEq/L (ref 3.5–5.1)
Sodium: 141 mEq/L (ref 135–145)
Total Bilirubin: 0.3 mg/dL (ref 0.2–1.2)
Total Protein: 6.8 g/dL (ref 6.0–8.3)

## 2016-01-19 LAB — LDL CHOLESTEROL, DIRECT: Direct LDL: 89 mg/dL

## 2016-01-19 LAB — HEMOGLOBIN A1C: Hgb A1c MFr Bld: 6.2 % (ref 4.6–6.5)

## 2016-01-19 NOTE — Telephone Encounter (Signed)
-----   Message from Marchia Bond sent at 01/14/2016 10:01 AM EDT ----- Regarding: Cpx labs Tues 8/22, need orders. Thanks! :-) Please order  future cpx labs for pt's upcoming lab appt. Thanks Aniceto Boss

## 2016-01-20 LAB — HEPATITIS C ANTIBODY: HCV Ab: NEGATIVE

## 2016-01-26 ENCOUNTER — Encounter: Payer: Medicare Other | Admitting: Family Medicine

## 2016-02-04 ENCOUNTER — Encounter: Payer: Self-pay | Admitting: Family Medicine

## 2016-02-04 ENCOUNTER — Ambulatory Visit (INDEPENDENT_AMBULATORY_CARE_PROVIDER_SITE_OTHER): Payer: Medicare Other | Admitting: Family Medicine

## 2016-02-04 VITALS — BP 124/84 | HR 80 | Temp 98.5°F | Ht 58.25 in | Wt 168.0 lb

## 2016-02-04 DIAGNOSIS — K219 Gastro-esophageal reflux disease without esophagitis: Secondary | ICD-10-CM

## 2016-02-04 DIAGNOSIS — B0229 Other postherpetic nervous system involvement: Secondary | ICD-10-CM

## 2016-02-04 DIAGNOSIS — R7303 Prediabetes: Secondary | ICD-10-CM

## 2016-02-04 DIAGNOSIS — Z0000 Encounter for general adult medical examination without abnormal findings: Secondary | ICD-10-CM

## 2016-02-04 DIAGNOSIS — Z23 Encounter for immunization: Secondary | ICD-10-CM | POA: Diagnosis not present

## 2016-02-04 DIAGNOSIS — M810 Age-related osteoporosis without current pathological fracture: Secondary | ICD-10-CM | POA: Diagnosis not present

## 2016-02-04 DIAGNOSIS — E7800 Pure hypercholesterolemia, unspecified: Secondary | ICD-10-CM

## 2016-02-04 NOTE — Assessment & Plan Note (Signed)
LDL at goal < 130 on no medication.

## 2016-02-04 NOTE — Progress Notes (Signed)
HPI I have personally reviewed the Medicare Annual Wellness questionnaire and have noted 1.                   The patient's medical and social history 2.                   Their use of alcohol, tobacco or illicit drugs 3.                   Their current medications and supplements 4.                   The patient's functional ability including ADL's, fall risks, home safety risks and hearing or visual             impairment. 5.                   Diet and physical activities 6.                   Evidence for depression or mood disorders 7.         Updated provider list Cognitive evaluation was performed and recorded on pt medicare questionnaire form. The patients weight, height, BMI and visual acuity have been recorded in the chart  I have made referrals, counseling and provided education to the patient based review of the above and I have provided the pt with a written personalized care plan for preventive services.   Chronic bronchitis: Followed by Dr. Annamaria Boots. Plan-nebulizer bronchodilator, Depo-Medrol, sample Breo 200, refill albuterol rescue inhaler with discussion, doxycycline  Seeing [facility] for right CMC osteoarthritis, s/p steroid injection   Elevated Cholesterol: At goal LDL < 130 not on any medication.  Lab Results  Component Value Date   CHOL 160 01/19/2016   HDL 42.00 01/19/2016   LDLCALC 99 01/15/2015   LDLDIRECT 89.0 01/19/2016   TRIG 298.0 (H) 01/19/2016   CHOLHDL 4 01/19/2016  Using medications without problems: None  Muscle aches: None  Diet compliance: moderate  Exercise: Going on walks.  Other complaints:  Wt Readings from Last 3 Encounters:  02/04/16 168 lb (76.2 kg)  01/01/16 168 lb 8 oz (76.4 kg)  11/26/15 168 lb 8 oz (76.4 kg)   BP Readings from Last 3 Encounters:  02/04/16 124/84  01/01/16 113/87  11/26/15 130/90     Vit D low.. Was off for a while.  Prediabetes: improved. Lab Results  Component Value Date   HGBA1C 6.2 01/19/2016   Hx  of DCIS of the right breast status post lumpectomy.  Now tamoxifen x on yr 4/5  Dr. Sondra Come radiation onc.  Dr. Lindi Adie is oncologist. Reviewed last [facility] . Dr. Emmie Niemann in surgeon.   GERD: Stable control, rarely symptomatic.   Post herpetic neuralgia:  Moderate control on lyrica, overall working better than gabapentin.. Some nights good control on current dosing.. Some days has to take an additional lyrica in middle of the night. Total each day is 375 mg and 450mg  ( safe max is 600 mg )   Social History /Family History/Past Medical History reviewed and updated if needed.  Review of Systems  Constitutional: Negative for fever, fatigue and unexpected weight change.  HENT: Negative for ear pain, congestion, sore throat, sneezing, trouble swallowing and sinus pressure.  Eyes: Negative for pain and itching.  Respiratory: Negative for cough, shortness of breath and wheezing.  Cardiovascular: Negative for chest pain, palpitations and leg swelling.  Gastrointestinal: Negative for nausea, abdominal  pain, diarrhea, constipation and blood in stool.  Genitourinary: Negative for dysuria, hematuria, vaginal discharge, difficulty urinating and menstrual problem.  Skin: Negative for rash.  Neurological: Negative for syncope, weakness, light-headedness, numbness and headaches.  Psychiatric/Behavioral: Negative for confusion and dysphoric mood. The patient is not nervous/anxious.  Objective:   Physical Exam  Constitutional: Vital signs are normal. She appears well-developed and well-nourished. She is cooperative. Non-toxic appearance. She does not appear ill. No distress.  HENT:  Head: Normocephalic.  Right Ear: Hearing, tympanic membrane, external ear and ear canal normal.  Left Ear: Hearing, tympanic membrane, external ear and ear canal normal.  Nose: Nose normal.  Eyes: Conjunctivae, EOM and lids are normal. Pupils are equal, round, and reactive to light. No foreign bodies found.   Neck: Trachea normal and normal range of motion. Neck supple. Carotid bruit is not present. No mass and no thyromegaly present.  Cardiovascular: Normal rate, regular rhythm, S1 normal, S2 normal, normal heart sounds and intact distal pulses. Exam reveals no gallop.  No murmur heard.  Pulmonary/Chest: Effort normal and breath sounds normal. No respiratory distress. She has no wheezes. She has no rhonchi. She has no rales.  Abdominal: Soft. Normal appearance and bowel sounds are normal. She exhibits no distension, no fluid wave, no abdominal bruit and no mass. There is no hepatosplenomegaly. There is no tenderness. There is no rebound, no guarding and no CVA tenderness. No hernia.  Lymphadenopathy:  She has no cervical adenopathy.  She has no axillary adenopathy.  Neurological: She is alert. She has normal strength. No cranial nerve deficit or sensory deficit.  Skin: Skin is warm, dry and intact. No rash noted.  Psychiatric: Her speech is normal and behavior is normal. Judgment normal. Her mood appears not anxious. Cognition and memory are normal. She does not exhibit a depressed mood.  Assessment & Plan:   The patient's preventative maintenance and recommended screening tests for an annual wellness exam were reviewed in full today.  Brought up to date unless services declined.  Counselled on the importance of diet, exercise, and its role in overall health and mortality.  The patient's FH and SH was reviewed, including their home life, tobacco status, and drug and alcohol status.    Vaccines: Uptodate with pneumovax and prevnar, consider TDAP, shingles coverage. Given  flu. 04/2012 Dr. Carlean Purl, colonoscopy 5 adenomas max 6 mm Repeat colon 04/2017  DVE/PAP: plans to see GYN. Mammo: Breast cancer followed by Dr. Lindi Adie, last mammo 09/2015 DEXA:01/2014; osteoporosis, no change from 2007 on tamoxifen, fosamax not needed. Repeat in 2 years. Due now. Non smoker, sig second hand  some. Hearing loss in right ear. Dr. Warren Danes.  Hep C: neg

## 2016-02-04 NOTE — Progress Notes (Signed)
Pre visit review using our clinic review tool, if applicable. No additional management support is needed unless otherwise documented below in the visit note. 

## 2016-02-04 NOTE — Assessment & Plan Note (Signed)
Improved with gabapentin, not yet at max. Okay to use additional gabapentin prn at night.

## 2016-02-04 NOTE — Patient Instructions (Addendum)
Low fat, low carb diet, increase exercise as able and work on weight loss. Consider TDAP, shingles Stop at front desk to set up bone density.

## 2016-02-04 NOTE — Assessment & Plan Note (Signed)
Improved control . Encouraged exercise, weight loss, healthy eating habits.  

## 2016-02-04 NOTE — Assessment & Plan Note (Signed)
Well controlled. Continue current medication.  

## 2016-02-12 DIAGNOSIS — M5442 Lumbago with sciatica, left side: Secondary | ICD-10-CM | POA: Diagnosis not present

## 2016-02-13 ENCOUNTER — Emergency Department (HOSPITAL_COMMUNITY)
Admission: EM | Admit: 2016-02-13 | Discharge: 2016-02-13 | Disposition: A | Discharge status: Home / Self Care | Payer: Medicare Other | Attending: Emergency Medicine | Admitting: Emergency Medicine

## 2016-02-13 ENCOUNTER — Encounter (HOSPITAL_COMMUNITY): Payer: Self-pay

## 2016-02-13 DIAGNOSIS — M5432 Sciatica, left side: Secondary | ICD-10-CM | POA: Insufficient documentation

## 2016-02-13 DIAGNOSIS — Z85038 Personal history of other malignant neoplasm of large intestine: Secondary | ICD-10-CM | POA: Insufficient documentation

## 2016-02-13 DIAGNOSIS — Z853 Personal history of malignant neoplasm of breast: Secondary | ICD-10-CM | POA: Insufficient documentation

## 2016-02-13 DIAGNOSIS — M25552 Pain in left hip: Secondary | ICD-10-CM

## 2016-02-13 MED ORDER — ACETAMINOPHEN 500 MG PO TABS
1000.0000 mg | ORAL_TABLET | Freq: Once | ORAL | Status: AC
  Administered 2016-02-13: 1000 mg via ORAL
  Filled 2016-02-13: qty 2

## 2016-02-13 NOTE — ED Triage Notes (Addendum)
Patient complains of left hip pain since Wednesday. Has seen her MD for same and had xrays, to have CT 9/25. States that the pain is worse this am with any movement, denies trauma

## 2016-02-13 NOTE — ED Provider Notes (Signed)
[facility] DEPT Provider Note   CSN: [id number] Arrival date & time: 02/13/16  1635  By signing my name below, I, Katie Zimmerman, attest that this documentation has been prepared under the direction and in the presence of non-physician practitioner, Montine Circle, PA-C. Electronically Signed: Jeanell Zimmerman, Scribe. 02/13/2016. 6:31 PM.  History   Chief Complaint Chief Complaint  Patient presents with  . Hip Pain   The history is provided by the patient. No language interpreter was used.   HPI Comments: Katie Zimmerman is a 70 y.o. female who presents to the Emergency Department complaining of constant moderate left-sided hip pain that started 3 days ago. She states she saw [facility] yesterday for chronic pain and had X-rays done. She reports they are concerned about an injured disc so she is scheduled for an MRI on 02/22/16. She describes the radiating pain to left leg as worsening, exacerbated by movement, and unrelieved by tylenol. Reports LLE mobility has been negatively affected. Currently on lyrica for prior facial pain. Denies any known trauma.   Past Medical History:  Diagnosis Date  . Breast cancer ([place]) 12/2011  . Breast cancer ([place]) 12/2011   DCIS  . Complication of anesthesia    hard to wake up age 66-not since  . Fibromyalgia   . GERD (gastroesophageal reflux disease)   . History of radiation therapy 02/29/12 -04/21/12   right breast, 5040 cGy in 28 fx, boost to total 6240 cGy  . Osteoporosis   . Personal history of colonic polyps-adenomas 05/25/2012  . PHN (postherpetic neuralgia) 2000   Right side of face    Patient Active Problem List   Diagnosis Date Noted  . [place] arthritis 01/22/2015  . Counseling regarding end of life decision making 01/22/2015  . Osteoporosis 01/28/2014  . Hearing loss in right ear 01/20/2014  . Chronic bronchitis with acute exacerbation ([place]) 12/06/2013  . Personal history of colonic polyps-adenomas 05/25/2012  . GERD  (gastroesophageal reflux disease)   . History of radiation therapy   . Cancer of right breast, stage 0 01/26/2012  . Prediabetes 11/22/2011  . High cholesterol 11/22/2011  . TEMPOROMANDIBULAR JOINT PAIN 11/04/2009  . BRONCHITIS, CHRONIC 05/04/2009  . RENAL CALCULUS 05/04/2009  . Post herpetic neuralgia 04/14/2009    Past Surgical History:  Procedure Laterality Date  . APPENDECTOMY    . BIOPSY BREAST  01/10/12   Right breast needle cor biopsy  . BREAST LUMPECTOMY  01/26/2012   Right/ERPR+ DCIS, right ax snbx  . FOOT SURGERY  1996   rt/lt great toes  . GLAUCOMA SURGERY  1993   laser  . OVARIAN CYST SURGERY  1964  . TUBAL LIGATION  1976    OB History    No data available       Home Medications    Prior to Admission medications   Medication Sig Start Date End Date Taking? Authorizing Provider  acetaminophen (TYLENOL) 500 MG tablet Take 1,000 mg by mouth 3 (three) times daily.    Historical Provider, MD  albuterol (PROVENTIL HFA;VENTOLIN HFA) 108 (90 BASE) MCG/ACT inhaler Inhale 2 puffs into the lungs every 6 (six) hours as needed for wheezing. 01/22/15   Amy E Diona Browner, MD  BREO ELLIPTA 100-25 MCG/INH AEPB INHALE ONE PUFF BY MOUTH THEN RINSE MOUTH DAILY 11/20/15   Deneise Lever, MD  calcium carbonate (OS-CAL) 600 MG TABS tablet Take 600 mg by mouth 2 (two) times daily with a meal.    Historical Provider, MD  ibuprofen (ADVIL,MOTRIN) 200  MG tablet Take 800 mg by mouth 3 (three) times daily.    Historical Provider, MD  pregabalin (LYRICA) 75 MG capsule 2 cap in AM and 3 cap at night 01/01/16   Amy E Bedsole, MD  tamoxifen (NOLVADEX) 20 MG tablet TAKE 1 TABLET (20 MG TOTAL) BY MOUTH DAILY. 04/21/15   Nicholas Lose, MD  tamoxifen (NOLVADEX) 20 MG tablet TAKE 1 TABLET (20 MG TOTAL) BY MOUTH DAILY. 12/21/15   Nicholas Lose, MD  vitamin C (ASCORBIC ACID) 500 MG tablet Take 500 mg by mouth daily.    Historical Provider, MD  Vitamin D, Ergocalciferol, (DRISDOL) 50000 UNITS CAPS capsule Take  50,000 Units by mouth. Take 500 units 2 times a day    Historical Provider, MD    Family History Family History  Problem Relation Age of Onset  . Hypertension Mother   . Cancer Mother     LIVER cancer  . Hypertension Father   . Diabetes Father   . Heart failure Father   . Breast cancer Sister 74    NOT hormone receptor breast cancer  . Breast cancer Maternal Aunt 75  . Breast cancer Other     dx in her 64s  . Breast cancer Cousin 74    maternal cousin  . Breast cancer Cousin     maternal cousin  . Breast cancer Cousin     maternal cousin  . Cancer Cousin     unknown form of cancer; maternal cousin  . Cancer Cousin     unknown form of cancer; maternal cousin  . Cancer Cousin 11    ? lung cancer; maternal cousin  . Breast cancer Cousin     dx 40s-50s; paternal cousin  . Breast cancer Cousin     dx in her 35s; paternal cousin  . Colon cancer Neg Hx   . Stomach cancer Neg Hx     Social History Social History  Substance Use Topics  . Smoking status: Never Smoker  . Smokeless tobacco: Never Used  . Alcohol use Yes     Comment: occasional glass of wine     Allergies   Penicillins; Sulfonamide derivatives; and Venlafaxine   Review of Systems Review of Systems  Constitutional: Negative for chills and fever.  Gastrointestinal:       No bowel incontinence  Genitourinary:       No urinary incontinence  Musculoskeletal: Positive for arthralgias, back pain and myalgias.  Neurological:       No saddle anesthesia     Physical Exam Updated Vital Signs BP 161/92 (BP Location: Left Arm)   Pulse 77   Temp 98.3 F (36.8 C) (Oral)   Resp 18   SpO2 100%   Physical Exam Physical Exam  Constitutional: Pt appears well-developed and well-nourished. No distress.  HENT:  Head: Normocephalic and atraumatic.  Mouth/Throat: Oropharynx is clear and moist. No oropharyngeal exudate.  Eyes: Conjunctivae are normal.  Neck: Normal range of motion. Neck supple.  No  meningismus Cardiovascular: Normal rate, regular rhythm and intact distal pulses.   Pulmonary/Chest: Effort normal and breath sounds normal. No respiratory distress. Pt has no wheezes.  Abdominal: Pt exhibits no distension Musculoskeletal:  Lumbar paraspinal muscles tender to palpation, no bony CTLS spine tenderness, deformity, step-off, or crepitus Lymphadenopathy: Pt has no cervical adenopathy.  Neurological: Pt is alert and oriented Speech is clear and goal oriented, follows commands Normal 5/5 strength in upper and lower extremities bilaterally including dorsiflexion and plantar flexion, strong and equal grip  strength Sensation intact Great toe extension intact Moves extremities without ataxia, coordination intact Normal gait Normal balance No Clonus Skin: Skin is warm and dry. No rash noted. Pt is not diaphoretic. No erythema.  Psychiatric: Pt has a normal mood and affect. Behavior is normal.  Nursing note and vitals reviewed.    ED Treatments / Results  DIAGNOSTIC STUDIES: Oxygen Saturation is 100% on RA, normal by my interpretation.   COORDINATION OF CARE: 6:38 PM- Pt advised of plan for treatment and pt agrees.  Labs (all labs ordered are listed, but only abnormal results are displayed) Labs Reviewed - No data to display  EKG  EKG Interpretation None       Radiology No results found.  Procedures Procedures (including critical care time)  Medications Ordered in ED Medications  acetaminophen (TYLENOL) tablet 1,000 mg (not administered)     Initial Impression / Assessment and Plan / ED Course  I have reviewed the triage vital signs and the nursing notes.  Pertinent labs & imaging results that were available during my care of the patient were reviewed by me and considered in my medical decision making (see chart for details).  Clinical Course    Patient with back pain.  No neurological deficits and normal neuro exam.  Patient is ambulatory.  No loss of  bowel or bladder control.  Doubt cauda equina.  Denies fever,  doubt epidural abscess or other lesion. Recommend back exercises, stretching, RICE.  Patient already prescribed prednisone by orthopedics.  Scheduled to have MRI in 10 days.  Encouraged the patient that there could be a need for additional workup and/or imaging such as MRI, if the symptoms do not resolve. Patient advised that if the back pain does not resolve, or radiates, this could progress to more serious conditions and is encouraged to follow-up with PCP or orthopedics within 2 weeks.     Final Clinical Impressions(s) / ED Diagnoses   Final diagnoses:  Left hip pain  Sciatica of left side    New Prescriptions Discharge Medication List as of 02/13/2016  6:40 PM     I personally performed the services described in this documentation, which was scribed in my presence. The recorded information has been reviewed and is accurate.       Montine Circle, PA-C 02/13/16 [place], MD 02/17/16 [phone]

## 2016-02-13 NOTE — ED Notes (Signed)
Pt st's she has been having pain in her left hip for a while.  Was seen by ortho doctor yesterday.

## 2016-02-15 ENCOUNTER — Other Ambulatory Visit: Payer: Medicare Other

## 2016-02-16 ENCOUNTER — Telehealth: Payer: Self-pay | Admitting: Internal Medicine

## 2016-02-17 NOTE — Telephone Encounter (Signed)
Called spoke with pt. She states she canceled her ov for 02/18/16 due to back pain and would like to reschedule with CY. She was told that he has no availability until 05/2016 but feels she should be seen sooner. I explained to her that I would send a message to CY. I offered ov with NP and pt refused stating she would only see CY. She voiced understanding and had no further questions.    CY please advise

## 2016-02-17 NOTE — Telephone Encounter (Signed)
Called spoke with pt. Scheduled ov with CY on 03/09/16. She voiced understanding and had no further questions.

## 2016-02-17 NOTE — Telephone Encounter (Signed)
It would be ok for her this time if you can create a 15 minute work in [place] for her. Please always check with me or Katie.

## 2016-02-18 ENCOUNTER — Ambulatory Visit: Payer: Medicare Other | Admitting: Internal Medicine

## 2016-02-22 DIAGNOSIS — M5442 Lumbago with sciatica, left side: Secondary | ICD-10-CM | POA: Diagnosis not present

## 2016-02-23 ENCOUNTER — Inpatient Hospital Stay: Admission: RE | Admit: 2016-02-23 | Payer: Medicare Other | Source: Ambulatory Visit

## 2016-03-04 ENCOUNTER — Telehealth: Payer: Self-pay | Admitting: Family Medicine

## 2016-03-04 NOTE — Telephone Encounter (Signed)
Patient Name: Katie Zimmerman  DOB: [birth year]    Initial Comment Caller states been vomiting and very nauseated, diarrhea   Nurse Assessment  Nurse: Verlin Fester, RN, Stanton Kidney Date/Time (Eastern Time): 03/04/2016 12:01:05 PM  Confirm and document reason for call. If symptomatic, describe symptoms. You must click the next button to save text entered. ---Patient states she was vomiting and nauseated she is having diarrhea today x 4  Has the patient traveled out of the country within the last 30 days? ---No  Does the patient have any new or worsening symptoms? ---Yes  Will a triage be completed? ---Yes  Related visit to physician within the last 2 weeks? ---No  Does the PT have any chronic conditions? (i.e. diabetes, asthma, etc.) ---Yes  List chronic conditions. ---nerve damage on the right side of her face  Is this a behavioral health or substance abuse call? ---No     Guidelines    Guideline Title Affirmed Question Affirmed Notes  Diarrhea Abdominal pain (Exception: Pain clears with each passage of diarrhea stool)    Final Disposition User   See Physician within [place], RN, Stanton Kidney    Comments  Patient states she does not know if she can get someone to bring her in for appt today and no appt available yet for Saturday. Asked her to call back a little later for Saturday appt.   Referrals  GO TO FACILITY UNDECIDED   Disagree/Comply: Comply

## 2016-03-04 NOTE — Telephone Encounter (Addendum)
I spoke with pt and Morey Hummingbird was going to schedule appt for Sat Clinic 03/05/16 but pt did not want to come at 9 am which is the next appt for Sat clinic so pt will cb later this afternoon and speak with Morey Hummingbird about scheduling Sat clinic.FYI to Dr Diona Browner.

## 2016-03-05 ENCOUNTER — Encounter (HOSPITAL_COMMUNITY): Payer: Self-pay | Admitting: Emergency Medicine

## 2016-03-05 ENCOUNTER — Emergency Department (HOSPITAL_COMMUNITY)
Admission: EM | Admit: 2016-03-05 | Discharge: 2016-03-05 | Disposition: A | Discharge status: Home / Self Care | Payer: Medicare Other | Attending: Emergency Medicine | Admitting: Emergency Medicine

## 2016-03-05 ENCOUNTER — Ambulatory Visit (HOSPITAL_COMMUNITY)
Admission: EM | Admit: 2016-03-05 | Discharge: 2016-03-05 | Disposition: A | Discharge status: Other Acute Inpatient Hospital | Payer: Medicare Other | Attending: Internal Medicine | Admitting: Internal Medicine

## 2016-03-05 DIAGNOSIS — N3000 Acute cystitis without hematuria: Secondary | ICD-10-CM | POA: Insufficient documentation

## 2016-03-05 DIAGNOSIS — R197 Diarrhea, unspecified: Secondary | ICD-10-CM

## 2016-03-05 DIAGNOSIS — A09 Infectious gastroenteritis and colitis, unspecified: Secondary | ICD-10-CM | POA: Insufficient documentation

## 2016-03-05 DIAGNOSIS — R531 Weakness: Secondary | ICD-10-CM

## 2016-03-05 DIAGNOSIS — R112 Nausea with vomiting, unspecified: Secondary | ICD-10-CM | POA: Diagnosis not present

## 2016-03-05 DIAGNOSIS — R1084 Generalized abdominal pain: Secondary | ICD-10-CM | POA: Diagnosis not present

## 2016-03-05 DIAGNOSIS — E860 Dehydration: Secondary | ICD-10-CM | POA: Diagnosis not present

## 2016-03-05 DIAGNOSIS — Z853 Personal history of malignant neoplasm of breast: Secondary | ICD-10-CM | POA: Diagnosis not present

## 2016-03-05 LAB — CBC
HCT: 49.9 % — ABNORMAL HIGH (ref 36.0–46.0)
Hemoglobin: 16.1 g/dL — ABNORMAL HIGH (ref 12.0–15.0)
MCH: 29.9 pg (ref 26.0–34.0)
MCHC: 32.3 g/dL (ref 30.0–36.0)
MCV: 92.6 fL (ref 78.0–100.0)
Platelets: 360 10*3/uL (ref 150–400)
RBC: 5.39 MIL/uL — ABNORMAL HIGH (ref 3.87–5.11)
RDW: 12.5 % (ref 11.5–15.5)
WBC: 10.4 10*3/uL (ref 4.0–10.5)

## 2016-03-05 LAB — COMPREHENSIVE METABOLIC PANEL
ALT: 44 U/L (ref 14–54)
AST: 41 U/L (ref 15–41)
Albumin: 4.1 g/dL (ref 3.5–5.0)
Alkaline Phosphatase: 71 U/L (ref 38–126)
Anion gap: 12 (ref 5–15)
BUN: 16 mg/dL (ref 6–20)
CO2: 19 mmol/L — ABNORMAL LOW (ref 22–32)
Calcium: 9.7 mg/dL (ref 8.9–10.3)
Chloride: 107 mmol/L (ref 101–111)
Creatinine, Ser: 1.05 mg/dL — ABNORMAL HIGH (ref 0.44–1.00)
GFR calc Af Amer: >60 mL/min (ref >60)
GFR calc non Af Amer: 53 mL/min — ABNORMAL LOW (ref >60)
Glucose, Bld: 124 mg/dL — ABNORMAL HIGH (ref 65–99)
Potassium: 4 mmol/L (ref 3.5–5.1)
Sodium: 138 mmol/L (ref 135–145)
Total Bilirubin: 0.6 mg/dL (ref 0.3–1.2)
Total Protein: 7.8 g/dL (ref 6.5–8.1)

## 2016-03-05 LAB — URINALYSIS, ROUTINE W REFLEX MICROSCOPIC
Glucose, UA: NEGATIVE mg/dL
Ketones, ur: 15 mg/dL — AB
Nitrite: POSITIVE — AB
Protein, ur: 100 mg/dL — AB
Specific Gravity, Urine: 1.02 (ref 1.005–1.030)
pH: 6 (ref 5.0–8.0)

## 2016-03-05 LAB — POCT URINALYSIS DIP (DEVICE)
Glucose, UA: NEGATIVE mg/dL
Ketones, ur: 40 mg/dL — AB
Nitrite: POSITIVE — AB
Protein, ur: 100 mg/dL — AB
Specific Gravity, Urine: 1.025 (ref 1.005–1.030)
Urobilinogen, UA: 1 mg/dL (ref 0.0–1.0)
pH: 5.5 (ref 5.0–8.0)

## 2016-03-05 LAB — URINE MICROSCOPIC-ADD ON

## 2016-03-05 LAB — LIPASE, BLOOD: Lipase: 47 U/L (ref 11–51)

## 2016-03-05 MED ORDER — FAMOTIDINE 20 MG PO TABS: 20.0000 mg | ORAL_TABLET | Freq: Two times a day (BID) | ORAL | 0 refills | Status: DC

## 2016-03-05 MED ORDER — CIPROFLOXACIN HCL 500 MG PO TABS
500.0000 mg | ORAL_TABLET | Freq: Once | ORAL | Status: AC
  Administered 2016-03-05: 500 mg via ORAL
  Filled 2016-03-05: qty 1

## 2016-03-05 MED ORDER — SODIUM CHLORIDE 0.9 % IV SOLN
1000.0000 mL | Freq: Once | INTRAVENOUS | Status: AC
  Administered 2016-03-05: 1000 mL via INTRAVENOUS

## 2016-03-05 MED ORDER — ONDANSETRON 4 MG PO TBDP
8.0000 mg | ORAL_TABLET | Freq: Once | ORAL | Status: AC
  Administered 2016-03-05: 8 mg via ORAL

## 2016-03-05 MED ORDER — CIPROFLOXACIN HCL 500 MG PO TABS: 500.0000 mg | ORAL_TABLET | Freq: Two times a day (BID) | ORAL | 0 refills | Status: DC

## 2016-03-05 MED ORDER — ONDANSETRON 4 MG PO TBDP: 4.0000 mg | ORAL_TABLET | ORAL | 0 refills | Status: DC | PRN

## 2016-03-05 MED ORDER — ONDANSETRON 4 MG PO TBDP
ORAL_TABLET | ORAL | Status: AC
  Filled 2016-03-05: qty 2

## 2016-03-05 MED ORDER — SODIUM CHLORIDE 0.9 % IV SOLN
1000.0000 mL | INTRAVENOUS | Status: DC
  Administered 2016-03-05: 1000 mL via INTRAVENOUS

## 2016-03-05 NOTE — ED Triage Notes (Signed)
Complains of nausea, vomiting, and diarrhea.  Onset 9/30.

## 2016-03-05 NOTE — ED Provider Notes (Signed)
[facility] DEPT Provider Note   CSN: [id number] Arrival date & time: 03/05/16  1505     History   Chief Complaint Chief Complaint  Patient presents with  . Nausea  . Emesis  . Diarrhea    HPI Katie Zimmerman is a 70 y.o. female.  HPI Patient poor he's had diarrhea, about 2 episodes per day for the past 3-4 days. She reports yesterday evening she started having vomiting as well. She states she vomited several times. This she has had lower abdominal discomfort. Sometimes cramping in nature. Patient has not had fever that she knows of. She denies sick contact. Patient was initially seen in urgent care referred to the emergency department. Past Medical History:  Diagnosis Date  . Breast cancer ([place]) 12/2011  . Breast cancer ([place]) 12/2011   DCIS  . Complication of anesthesia    hard to wake up age 78-not since  . Fibromyalgia   . GERD (gastroesophageal reflux disease)   . History of radiation therapy 02/29/12 -04/21/12   right breast, 5040 cGy in 28 fx, boost to total 6240 cGy  . Osteoporosis   . Personal history of colonic polyps-adenomas 05/25/2012  . PHN (postherpetic neuralgia) 2000   Right side of face    Patient Active Problem List   Diagnosis Date Noted  . [place] arthritis 01/22/2015  . Counseling regarding end of life decision making 01/22/2015  . Osteoporosis 01/28/2014  . Hearing loss in right ear 01/20/2014  . Chronic bronchitis with acute exacerbation ([place]) 12/06/2013  . Personal history of colonic polyps-adenomas 05/25/2012  . GERD (gastroesophageal reflux disease)   . History of radiation therapy   . Cancer of right breast, stage 0 01/26/2012  . Prediabetes 11/22/2011  . High cholesterol 11/22/2011  . TEMPOROMANDIBULAR JOINT PAIN 11/04/2009  . BRONCHITIS, CHRONIC 05/04/2009  . RENAL CALCULUS 05/04/2009  . Post herpetic neuralgia 04/14/2009    Past Surgical History:  Procedure Laterality Date  . APPENDECTOMY    . BIOPSY BREAST  01/10/12   Right breast  needle cor biopsy  . BREAST LUMPECTOMY  01/26/2012   Right/ERPR+ DCIS, right ax snbx  . FOOT SURGERY  1996   rt/lt great toes  . GLAUCOMA SURGERY  1993   laser  . OVARIAN CYST SURGERY  1964  . TUBAL LIGATION  1976    OB History    No data available       Home Medications    Prior to Admission medications   Medication Sig Start Date End Date Taking? Authorizing Provider  acetaminophen (TYLENOL) 500 MG tablet Take 1,000 mg by mouth daily as needed for mild pain.    Yes Historical Provider, MD  albuterol (PROVENTIL HFA;VENTOLIN HFA) 108 (90 BASE) MCG/ACT inhaler Inhale 2 puffs into the lungs every 6 (six) hours as needed for wheezing. 01/22/15  Yes Amy E Bedsole, MD  BREO ELLIPTA 100-25 MCG/INH AEPB INHALE ONE PUFF BY MOUTH THEN RINSE MOUTH DAILY 11/20/15  Yes Deneise Lever, MD  Cholecalciferol (VITAMIN D PO) Take 2 tablets by mouth 2 (two) times daily. 500mg  tablet   Yes Historical Provider, MD  ibuprofen (ADVIL,MOTRIN) 200 MG tablet Take 800 mg by mouth every 6 (six) hours as needed for moderate pain.    Yes Historical Provider, MD  pregabalin (LYRICA) 75 MG capsule 2 cap in AM and 3 cap at night 01/01/16  Yes Amy E Bedsole, MD  tamoxifen (NOLVADEX) 20 MG tablet TAKE 1 TABLET (20 MG TOTAL) BY MOUTH DAILY. 04/21/15  Yes Nicholas Lose, MD  traMADol (ULTRAM) 5 mg/mL SUSP Take 25 mg by mouth daily as needed for moderate pain.    Yes Historical Provider, MD  vitamin C (ASCORBIC ACID) 500 MG tablet Take 500 mg by mouth daily.   Yes Historical Provider, MD  ciprofloxacin (CIPRO) 500 MG tablet Take 1 tablet (500 mg total) by mouth 2 (two) times daily. One po bid x 7 days 03/05/16   Charlesetta Shanks, MD  famotidine (PEPCID) 20 MG tablet Take 1 tablet (20 mg total) by mouth 2 (two) times daily. 03/05/16   Charlesetta Shanks, MD  ondansetron (ZOFRAN ODT) 4 MG disintegrating tablet Take 1 tablet (4 mg total) by mouth every 4 (four) hours as needed for nausea or vomiting. 03/05/16   Charlesetta Shanks, MD    tamoxifen (NOLVADEX) 20 MG tablet TAKE 1 TABLET (20 MG TOTAL) BY MOUTH DAILY. Patient not taking: Reported on 03/05/2016 12/21/15   Nicholas Lose, MD    Family History Family History  Problem Relation Age of Onset  . Hypertension Mother   . Cancer Mother     LIVER cancer  . Hypertension Father   . Diabetes Father   . Heart failure Father   . Breast cancer Sister 60    NOT hormone receptor breast cancer  . Breast cancer Maternal Aunt 75  . Breast cancer Other     dx in her 68s  . Breast cancer Cousin 64    maternal cousin  . Breast cancer Cousin     maternal cousin  . Breast cancer Cousin     maternal cousin  . Cancer Cousin     unknown form of cancer; maternal cousin  . Cancer Cousin     unknown form of cancer; maternal cousin  . Cancer Cousin 11    ? lung cancer; maternal cousin  . Breast cancer Cousin     dx 47s-50s; paternal cousin  . Breast cancer Cousin     dx in her 19s; paternal cousin  . Colon cancer Neg Hx   . Stomach cancer Neg Hx     Social History Social History  Substance Use Topics  . Smoking status: Never Smoker  . Smokeless tobacco: Never Used  . Alcohol use Yes     Comment: occasional glass of wine     Allergies   Penicillins; Sulfonamide derivatives; and Venlafaxine   Review of Systems Review of Systems 10 Systems reviewed and are negative for acute change except as noted in the HPI.   Physical Exam Updated Vital Signs BP 137/77   Pulse 72   Temp 98.2 F (36.8 C) (Oral)   Resp 16   SpO2 96%   Physical Exam  Constitutional: She is oriented to person, place, and time. She appears well-developed and well-nourished. No distress.  Patient appears slightly fatigued and pale. She is alert and nontoxic. No respiratory distress.  HENT:  Head: Normocephalic and atraumatic.  Mucous memories slightly dry. Posterior or first widely patent.  Eyes: Conjunctivae and EOM are normal. Pupils are equal, round, and reactive to light.  Neck: Neck  supple.  Cardiovascular: Normal rate and regular rhythm.   No murmur heard. Pulmonary/Chest: Effort normal and breath sounds normal. No respiratory distress.  Abdominal: Soft. There is tenderness.  Mild lower abdominal tenderness to palpation no guarding.  Musculoskeletal: Normal range of motion. She exhibits no edema, tenderness or deformity.  Neurological: She is alert and oriented to person, place, and time. No cranial nerve deficit. She exhibits normal  muscle tone. Coordination normal.  Skin: Skin is warm and dry. There is pallor.  Psychiatric: She has a normal mood and affect.  Nursing note and vitals reviewed.    ED Treatments / Results  Labs (all labs ordered are listed, but only abnormal results are displayed) Labs Reviewed  COMPREHENSIVE METABOLIC PANEL - Abnormal; Notable for the following:       Result Value   CO2 19 (*)    Glucose, Bld 124 (*)    Creatinine, Ser 1.05 (*)    GFR calc non Af Amer 53 (*)    All other components within normal limits  CBC - Abnormal; Notable for the following:    RBC 5.39 (*)    Hemoglobin 16.1 (*)    HCT 49.9 (*)    All other components within normal limits  URINALYSIS, ROUTINE W REFLEX MICROSCOPIC (NOT AT [facility]) - Abnormal; Notable for the following:    APPearance CLOUDY (*)    Hgb urine dipstick MODERATE (*)    Bilirubin Urine MODERATE (*)    Ketones, ur 15 (*)    Protein, ur 100 (*)    Nitrite POSITIVE (*)    Leukocytes, UA LARGE (*)    All other components within normal limits  URINE MICROSCOPIC-ADD ON - Abnormal; Notable for the following:    Squamous Epithelial / LPF 6-30 (*)    Bacteria, UA MANY (*)    All other components within normal limits  LIPASE, BLOOD  URINALYSIS, ROUTINE W REFLEX MICROSCOPIC (NOT AT [facility])    EKG  EKG Interpretation None       Radiology No results found.  Procedures Procedures (including critical care time)  Medications Ordered in ED Medications  0.9 %  sodium chloride infusion (0 mLs  Intravenous Stopped 03/05/16 2140)    Followed by  0.9 %  sodium chloride infusion (0 mLs Intravenous Stopped 03/05/16 2140)  ciprofloxacin (CIPRO) tablet 500 mg (500 mg Oral Given 03/05/16 2139)     Initial Impression / Assessment and Plan / ED Course  I have reviewed the triage vital signs and the nursing notes.  Pertinent labs & imaging results that were available during my care of the patient were reviewed by me and considered in my medical decision making (see chart for details).  Clinical Course    Final Clinical Impressions(s) / ED Diagnoses   Final diagnoses:  Dehydration  Diarrhea of presumed infectious origin  Acute cystitis without hematuria   Patient had acute onset of diarrheal illness and then several points of vomiting as well. Lab work suggest dehydration. Urinalysis also is consistent with UTI. The patient is rehydrated in the emergency department and felt much improved. She will be started on Cipro for UTI. Patient poor she has tolerated this but has other antibiotic allergies.   New Prescriptions Discharge Medication List as of 03/05/2016  9:14 PM    START taking these medications   Details  ciprofloxacin (CIPRO) 500 MG tablet Take 1 tablet (500 mg total) by mouth 2 (two) times daily. One po bid x 7 days, Starting Sat 03/05/2016, Print    famotidine (PEPCID) 20 MG tablet Take 1 tablet (20 mg total) by mouth 2 (two) times daily., Starting Sat 03/05/2016, Print    ondansetron (ZOFRAN ODT) 4 MG disintegrating tablet Take 1 tablet (4 mg total) by mouth every 4 (four) hours as needed for nausea or vomiting., Starting Sat 03/05/2016, Print         Charlesetta Shanks, MD 03/06/16 0001

## 2016-03-05 NOTE — ED Provider Notes (Signed)
CSN: [id number]     Arrival date & time 03/05/16  1215 History   First MD Initiated Contact with Patient 03/05/16 1422     Chief Complaint  Patient presents with  . Emesis  . Diarrhea   (Consider location/radiation/quality/duration/timing/severity/associated sxs/prior Treatment) [name] is a 70 y.o female with history of GERD, postherpetic neuralgia, hx of breast cancer, and fibromyalgia presents today for nausea, vomiting, diarrhea for 1 week with no improvement with new onset of abdominal pain onset today. Patient did felt better 4 days ago but got worst again and is now having abdominal pain. She have had 3 episodes of diarrhea today and "about the same" the past few days. She denies hematochezia and melena. She reports her vomiting is non-bilious, non-projectile and denies hematemesis. She reports her abdominal pain is generalized with no particular area of worst pain. She endorses decreased appetite and unable to eat and unable to keep food down. She reports only been able to keep fluid down and reports only have been drinking a little bit. She also endorses that her belly is larger than usual, and reports feeling weak.       Past Medical History:  Diagnosis Date  . Breast cancer ([place]) 12/2011  . Breast cancer ([place]) 12/2011   DCIS  . Complication of anesthesia    hard to wake up age 5-not since  . Fibromyalgia   . GERD (gastroesophageal reflux disease)   . History of radiation therapy 02/29/12 -04/21/12   right breast, 5040 cGy in 28 fx, boost to total 6240 cGy  . Osteoporosis   . Personal history of colonic polyps-adenomas 05/25/2012  . PHN (postherpetic neuralgia) 2000   Right side of face   Past Surgical History:  Procedure Laterality Date  . APPENDECTOMY    . BIOPSY BREAST  01/10/12   Right breast needle cor biopsy  . BREAST LUMPECTOMY  01/26/2012   Right/ERPR+ DCIS, right ax snbx  . FOOT SURGERY  1996   rt/lt great toes  . GLAUCOMA SURGERY  1993   laser  . OVARIAN  CYST SURGERY  1964  . TUBAL LIGATION  1976   Family History  Problem Relation Age of Onset  . Hypertension Mother   . Cancer Mother     LIVER cancer  . Hypertension Father   . Diabetes Father   . Heart failure Father   . Breast cancer Sister 26    NOT hormone receptor breast cancer  . Breast cancer Maternal Aunt 75  . Breast cancer Other     dx in her 72s  . Breast cancer Cousin 89    maternal cousin  . Breast cancer Cousin     maternal cousin  . Breast cancer Cousin     maternal cousin  . Cancer Cousin     unknown form of cancer; maternal cousin  . Cancer Cousin     unknown form of cancer; maternal cousin  . Cancer Cousin 11    ? lung cancer; maternal cousin  . Breast cancer Cousin     dx 77s-50s; paternal cousin  . Breast cancer Cousin     dx in her 28s; paternal cousin  . Colon cancer Neg Hx   . Stomach cancer Neg Hx    Social History  Substance Use Topics  . Smoking status: Never Smoker  . Smokeless tobacco: Never Used  . Alcohol use Yes     Comment: occasional glass of wine   OB History    No data  available     Review of Systems  Constitutional: Positive for appetite change, chills and fatigue. Negative for fever.       +sweating   HENT: Negative for congestion, rhinorrhea, sinus pressure, sneezing and sore throat.   Respiratory: Negative for cough and shortness of breath.   Cardiovascular: Negative for chest pain and palpitations.  Gastrointestinal: Positive for abdominal distention, abdominal pain, diarrhea, nausea and vomiting. Negative for blood in stool.  Genitourinary: Negative for dysuria.       +left flank pain but been present prior to getting sick, +concentrated urine  Neurological: Positive for weakness. Negative for dizziness and headaches.    Allergies  Penicillins; Sulfonamide derivatives; and Venlafaxine  Home Medications   Prior to Admission medications   Medication Sig Start Date End Date Taking? Authorizing Provider  pregabalin  (LYRICA) 75 MG capsule 2 cap in AM and 3 cap at night 01/01/16  Yes Amy E Bedsole, MD  tamoxifen (NOLVADEX) 20 MG tablet TAKE 1 TABLET (20 MG TOTAL) BY MOUTH DAILY. Patient not taking: Reported on 03/05/2016 12/21/15  Yes Nicholas Lose, MD  traMADol (ULTRAM) 5 mg/mL SUSP Take 25 mg by mouth daily as needed for moderate pain.    Yes Historical Provider, MD  vitamin C (ASCORBIC ACID) 500 MG tablet Take 500 mg by mouth daily.   Yes Historical Provider, MD  acetaminophen (TYLENOL) 500 MG tablet Take 1,000 mg by mouth daily as needed for mild pain.     Historical Provider, MD  albuterol (PROVENTIL HFA;VENTOLIN HFA) 108 (90 BASE) MCG/ACT inhaler Inhale 2 puffs into the lungs every 6 (six) hours as needed for wheezing. 01/22/15   Amy E Diona Browner, MD  BREO ELLIPTA 100-25 MCG/INH AEPB INHALE ONE PUFF BY MOUTH THEN RINSE MOUTH DAILY 11/20/15   Deneise Lever, MD  Cholecalciferol (VITAMIN D PO) Take 2 tablets by mouth 2 (two) times daily. 500mg  tablet    Historical Provider, MD  ciprofloxacin (CIPRO) 500 MG tablet Take 1 tablet (500 mg total) by mouth 2 (two) times daily. One po bid x 7 days 03/05/16   Charlesetta Shanks, MD  famotidine (PEPCID) 20 MG tablet Take 1 tablet (20 mg total) by mouth 2 (two) times daily. 03/05/16   Charlesetta Shanks, MD  ibuprofen (ADVIL,MOTRIN) 200 MG tablet Take 800 mg by mouth every 6 (six) hours as needed for moderate pain.     Historical Provider, MD  ondansetron (ZOFRAN ODT) 4 MG disintegrating tablet Take 1 tablet (4 mg total) by mouth every 4 (four) hours as needed for nausea or vomiting. 03/05/16   Charlesetta Shanks, MD  tamoxifen (NOLVADEX) 20 MG tablet TAKE 1 TABLET (20 MG TOTAL) BY MOUTH DAILY. 04/21/15   Nicholas Lose, MD   Meds Ordered and Administered this Visit   Medications  ondansetron (ZOFRAN-ODT) disintegrating tablet 8 mg (8 mg Oral Given 03/05/16 1438)    BP 131/95 (BP Location: Left Arm) Comment (BP Location): Take BP only on Lt arm  Pulse 87   Temp 98.1 F (36.7 C) (Oral)    Resp 18   Ht 4\' 11"  (1.499 m)   Wt 165 lb (74.8 kg)   SpO2 99%   BMI 33.33 kg/m  No data found.   Physical Exam  Constitutional: She is oriented to person, place, and time. No distress.  HENT:  Head: Normocephalic and atraumatic.  Right Ear: External ear normal.  Left Ear: External ear normal.  Nose: Nose normal.  Mouth/Throat: Oropharynx is clear and moist. No oropharyngeal exudate.  Oral mucosa is mildly dry.   Eyes: EOM are normal. Pupils are equal, round, and reactive to light.  Neck: Normal range of motion. Neck supple.  Cardiovascular: Normal rate, regular rhythm and normal heart sounds.   Pulmonary/Chest: Effort normal and breath sounds normal. No respiratory distress. She has no wheezes.  Abdominal: Soft. She exhibits no mass. There is tenderness.  Bowel sounds are hypoactive  Lymphadenopathy:    She has no cervical adenopathy.  Neurological: She is alert and oriented to person, place, and time.  Skin: Skin is warm and dry. She is not diaphoretic.  Nursing note and vitals reviewed.   Urgent Care Course   Clinical Course    Procedures (including critical care time)  Labs Review Labs Reviewed  POCT URINALYSIS DIP (DEVICE) - Abnormal; Notable for the following:       Result Value   Bilirubin Urine SMALL (*)    Ketones, ur 40 (*)    Hgb urine dipstick MODERATE (*)    Protein, ur 100 (*)    Nitrite POSITIVE (*)    Leukocytes, UA LARGE (*)    All other components within normal limits    Imaging Review No results found.   MDM   1. Generalized abdominal pain   2. Nausea vomiting and diarrhea   3. Weakness    Mrs. Zimmerman is a 70 y.o female with history of GERD, postherpetic neuralgia, hx of breast cancer, and fibromyalgia presents today for nausea, vomiting, diarrhea for 1 week with no improvement with new onset of abdominal pain onset today. Physical examination reveals abdominal tenderness and questionable distention (as her friend mentioned that her  abdomen is usually large anyway). Patient transferred to the Emergency Department via shuttle for further workup and evaluation as IV fluid and labs and possible CT scan may be needed to ensure safety of the patient prior to discharging home.     Barry Dienes, NP 03/05/16 2215

## 2016-03-05 NOTE — ED Triage Notes (Signed)
Pt. Transferred from [facility], rn STATED, HER URINE LOOKED BAD. Here for N/V/D for a week.  Given 2 zofran at [facility]

## 2016-03-05 NOTE — ED Notes (Signed)
Report  Phoned  To  scarlett   Triage  Nurse

## 2016-03-05 NOTE — ED Notes (Signed)
Pt reports increase in nausea, received zofran at [facility] around 1500. Informed pt that unable to repeat dosage at this time but will try to get pt back to room assignment. vss

## 2016-03-05 NOTE — ED Triage Notes (Signed)
Patient reports feeling much better on Tuesday, but symptoms reoccurred.  Patient reports abdominal pain started today, general abdominal pain

## 2016-03-07 ENCOUNTER — Telehealth: Payer: Self-pay

## 2016-03-07 NOTE — Telephone Encounter (Signed)
Unable to reach pt by phone; tried both contact #s. Per chart review tab pt seen [place] on 03/05/16; pt is supposed to schedule f/u appt with PCP.

## 2016-03-07 NOTE — Telephone Encounter (Signed)
Pt called back and is scheduled for 10/10 @ 11:15am.   Pt aware

## 2016-03-07 NOTE — Telephone Encounter (Signed)
PLEASE NOTE: All timestamps contained within this report are represented as [place] Standard Time. CONFIDENTIALTY NOTICE: This fax transmission is intended only for the addressee. It contains information that is legally privileged, confidential or otherwise protected from use or disclosure. If you are not the intended recipient, you are strictly prohibited from reviewing, disclosing, copying using or disseminating any of this information or taking any action in reliance on or regarding this information. If you have received this fax in error, please notify us immediately by telephone so that we can arrange for its return to [place]. Phone: [phone], Toll-Free: [phone], Fax: [phone] Page: 1 of 1 Call Id: [id number] [facility] Patient Name: Katie Zimmerman Gender: Female DOB: [birth year] Age: 70 Y 2 M 3 D Return Phone Number: [phone] (Primary) Address: City/State/Zip: [place] Client [place] Night - Client Client Site [place] Physician Eliezer Lofts - MD Contact Type Call Who Is Calling Patient / Member / Family / Caregiver Call Type Triage / Clinical Caller Name Harriet Relationship To Patient Sibling Return Phone Number [phone] (Primary) Chief Complaint Paging or Request for Consult Reason for Call Symptomatic / Request for [place] said her sister has been vomiting, has nausea. Went to Urgent care, then sent to the ER. Has been there 2 hours. Wants to know if anything can be done Translation No Nurse Assessment Guidelines Guideline Title Affirmed Question Affirmed Notes Nurse Date/Time (Eastern Time) Disp. Time Eilene Ghazi Time) Disposition Final User 03/05/2016 9:01:02 PM Attempt made - message left Rhona Raider 03/05/2016 10:00:47 PM FINAL ATTEMPT MADE - message left Yes Georgina Peer, RN,  Kendrick Fries

## 2016-03-08 ENCOUNTER — Ambulatory Visit: Payer: Medicare Other | Admitting: Family Medicine

## 2016-03-08 ENCOUNTER — Telehealth: Payer: Self-pay | Admitting: *Deleted

## 2016-03-08 NOTE — Telephone Encounter (Signed)
Rica Mote of [employer] called for prior authorization for ondansetron (ZOFRAN ODT) 4 MG disintegrating tablet.  Rica Mote asked about the symptoms/ diagnosis leading to pt being prescribed ondansetron (ZOFRAN ODT) 4 MG disintegrating tablet.  Information given was: 3-4 days of nausea and vomiting with lower abdominal tenderness.

## 2016-03-09 ENCOUNTER — Ambulatory Visit: Payer: Medicare Other | Admitting: Internal Medicine

## 2016-03-15 ENCOUNTER — Ambulatory Visit (INDEPENDENT_AMBULATORY_CARE_PROVIDER_SITE_OTHER): Payer: Medicare Other | Admitting: Family Medicine

## 2016-03-15 ENCOUNTER — Ambulatory Visit (HOSPITAL_COMMUNITY)
Admission: RE | Admit: 2016-03-15 | Discharge: 2016-03-15 | Disposition: A | Discharge status: Home / Self Care | Payer: Medicare Other | Source: Ambulatory Visit | Attending: Family Medicine | Admitting: Family Medicine

## 2016-03-15 ENCOUNTER — Telehealth: Payer: Self-pay | Admitting: Family Medicine

## 2016-03-15 ENCOUNTER — Encounter: Payer: Self-pay | Admitting: Family Medicine

## 2016-03-15 DIAGNOSIS — R935 Abnormal findings on diagnostic imaging of other abdominal regions, including retroperitoneum: Secondary | ICD-10-CM | POA: Diagnosis not present

## 2016-03-15 DIAGNOSIS — N281 Cyst of kidney, acquired: Secondary | ICD-10-CM | POA: Diagnosis not present

## 2016-03-15 DIAGNOSIS — R1032 Left lower quadrant pain: Secondary | ICD-10-CM

## 2016-03-15 DIAGNOSIS — K8020 Calculus of gallbladder without cholecystitis without obstruction: Secondary | ICD-10-CM | POA: Diagnosis not present

## 2016-03-15 DIAGNOSIS — K760 Fatty (change of) liver, not elsewhere classified: Secondary | ICD-10-CM | POA: Diagnosis not present

## 2016-03-15 DIAGNOSIS — K449 Diaphragmatic hernia without obstruction or gangrene: Secondary | ICD-10-CM | POA: Diagnosis not present

## 2016-03-15 DIAGNOSIS — N3001 Acute cystitis with hematuria: Secondary | ICD-10-CM

## 2016-03-15 DIAGNOSIS — N390 Urinary tract infection, site not specified: Secondary | ICD-10-CM | POA: Insufficient documentation

## 2016-03-15 LAB — POC URINALSYSI DIPSTICK (AUTOMATED)
Bilirubin, UA: NEGATIVE
Blood, UA: NEGATIVE
Glucose, UA: NEGATIVE
Nitrite, UA: NEGATIVE
Spec Grav, UA: >=1.03
Urobilinogen, UA: 1
pH, UA: 6

## 2016-03-15 MED ORDER — IOPAMIDOL (ISOVUE-300) INJECTION 61%
INTRAVENOUS | Status: DC
Start: 2016-03-15 — End: 2016-03-16
  Filled 2016-03-15: qty 30

## 2016-03-15 MED ORDER — PROMETHAZINE HCL 25 MG PO TABS: 25.0000 mg | ORAL_TABLET | Freq: Three times a day (TID) | ORAL | 0 refills | Status: DC | PRN

## 2016-03-15 MED ORDER — ONDANSETRON HCL 4 MG PO TABS: 4.0000 mg | ORAL_TABLET | Freq: Four times a day (QID) | ORAL | 0 refills | Status: DC | PRN

## 2016-03-15 MED ORDER — IOPAMIDOL (ISOVUE-300) INJECTION 61%
INTRAVENOUS | Status: AC
  Administered 2016-03-15: 90 mL
  Filled 2016-03-15: qty 100

## 2016-03-15 NOTE — Progress Notes (Signed)
Pre visit review using our clinic review tool, if applicable. No additional management support is needed unless otherwise documented below in the visit note. 

## 2016-03-15 NOTE — Progress Notes (Signed)
   Subjective:    Patient ID: Katie Zimmerman, female    DOB: 1945/09/05, 70 y.o.   MRN: [id number]  HPI  70 year old female renal calculus, COPD, hx of breast cancer with history of  presents for follow up ER visit on 03/05/2016 for N/V, bilateral lower abd pain and diarrhea. No dysuria, no freq, no urgency. BMET essentially, nml wbc nml  Vitals stable UA pos nit and LE, epi 6-30  Dx with UTI, dehydration  Started on cipro 500 mg BID x 7 days to cover for systemic symptoms. Urine not sent for culture.  Today she reports  She continues to be nausea. Last emesis was 2 days ago. She is able to keep up with water and liquids. Drinking a lot of gingerale.  No diarrhea. No heartburn , no pain over stomach. She is now more constipated.. 2 BM in last week. Still has off and on pain in left lower abd, No pain with moving leg.  She had also been seen in ER on 9/16 for left hip pain.  MRI left hip or possibly spine at [facility]: not sure of results.    Review of Systems  Constitutional: Positive for fatigue. Negative for fever.  HENT: Negative for ear pain.   Eyes: Negative for pain.  Respiratory: Negative for chest tightness and shortness of breath.   Cardiovascular: Negative for chest pain, palpitations and leg swelling.  Gastrointestinal: Negative for abdominal pain.  Genitourinary: Negative for dysuria.       Objective:   Physical Exam  Constitutional: Vital signs are normal. She appears well-developed and well-nourished. She is cooperative.  Non-toxic appearance. She appears ill. No distress.  HENT:  Head: Normocephalic.  Right Ear: Hearing, tympanic membrane, external ear and ear canal normal. Tympanic membrane is not erythematous, not retracted and not bulging.  Left Ear: Hearing, tympanic membrane, external ear and ear canal normal. Tympanic membrane is not erythematous, not retracted and not bulging.  Nose: No mucosal edema or rhinorrhea. Right sinus exhibits no maxillary  sinus tenderness and no frontal sinus tenderness. Left sinus exhibits no maxillary sinus tenderness and no frontal sinus tenderness.  Mouth/Throat: Uvula is midline, oropharynx is clear and moist and mucous membranes are normal.  Eyes: Conjunctivae, EOM and lids are normal. Pupils are equal, round, and reactive to light. Lids are everted and swept, no foreign bodies found.  Neck: Trachea normal and normal range of motion. Neck supple. Carotid bruit is not present. No thyroid mass and no thyromegaly present.  Cardiovascular: Normal rate, regular rhythm, S1 normal, S2 normal, normal heart sounds, intact distal pulses and normal pulses.  Exam reveals no gallop and no friction rub.   No murmur heard. Pulmonary/Chest: Effort normal and breath sounds normal. No tachypnea. No respiratory distress. She has no decreased breath sounds. She has no wheezes. She has no rhonchi. She has no rales.  Abdominal: Soft. Normal appearance and bowel sounds are normal. There is no hepatosplenomegaly. There is tenderness in the suprapubic area and left lower quadrant. There is rebound. There is no CVA tenderness. No hernia. Hernia confirmed negative in the ventral area.  Neurological: She is alert.  Skin: Skin is warm, dry and intact. No rash noted.  Psychiatric: Her speech is normal and behavior is normal. Judgment and thought content normal. Her mood appears not anxious. Cognition and memory are normal. She does not exhibit a depressed mood.          Assessment & Plan:

## 2016-03-15 NOTE — Patient Instructions (Addendum)
Stop at front desk for CT abd pelvis.  Can use zofran as needed for nausea.  Use miralax for constipation.  Push fluids as tolerated to avoiud dehydration.  We will call with urine culture.. But it appears it will be clear.

## 2016-03-15 NOTE — Telephone Encounter (Signed)
Discussed results in detail with pt.  Needs pelvic US eval of irregularity in uterus.  Other findings not clearly cause of her symptoms but reviewed with pt.  Pt will push fluids and  Use phenergan prn.  Call pt in AM let her know she can also use  ibuprofen for pain or have MAr[facility] let her know when [place] set up. Ibuprofen may help with the inflammation seen in the fat near her transverse colon.  I have ordered pelvic US as well.

## 2016-03-15 NOTE — Assessment & Plan Note (Signed)
Unclear if pt had a true UTI or if symptoms were from something else as she had no specific urinary complaints. ASlso UA appeared contaminated and no culture was sent.  Today after cipro.. UA appear better but not sure if just better sample. Pt feels no better after cipro.

## 2016-03-15 NOTE — Assessment & Plan Note (Signed)
Unclear if other issue ongoing such as constipation, diverticulitis vs other.  Treat with miralax and send for CT as pt appears ill and , rebound in LLQ.

## 2016-03-16 NOTE — Telephone Encounter (Signed)
Katie Zimmerman notified [name] that it is okay to use ibuprofen for pain.

## 2016-03-17 ENCOUNTER — Ambulatory Visit (HOSPITAL_COMMUNITY)
Admission: RE | Admit: 2016-03-17 | Discharge: 2016-03-17 | Disposition: A | Discharge status: Home / Self Care | Payer: Medicare Other | Source: Ambulatory Visit | Attending: Family Medicine | Admitting: Family Medicine

## 2016-03-17 DIAGNOSIS — R1032 Left lower quadrant pain: Secondary | ICD-10-CM | POA: Insufficient documentation

## 2016-03-17 DIAGNOSIS — R935 Abnormal findings on diagnostic imaging of other abdominal regions, including retroperitoneum: Secondary | ICD-10-CM | POA: Insufficient documentation

## 2016-03-17 DIAGNOSIS — D259 Leiomyoma of uterus, unspecified: Secondary | ICD-10-CM | POA: Diagnosis not present

## 2016-03-17 LAB — URINE CULTURE: Organism ID, Bacteria: NO GROWTH

## 2016-03-18 ENCOUNTER — Telehealth: Payer: Self-pay | Admitting: Family Medicine

## 2016-03-18 DIAGNOSIS — R1032 Left lower quadrant pain: Secondary | ICD-10-CM

## 2016-03-18 DIAGNOSIS — R9389 Abnormal findings on diagnostic imaging of other specified body structures: Secondary | ICD-10-CM

## 2016-03-18 HISTORY — DX: Abnormal findings on diagnostic imaging of other specified body structures: R93.89

## 2016-03-18 NOTE — Telephone Encounter (Signed)
Called pt to discuss results in  detail with pt. She is feeling better still weak..no nausea, no pain, no vag bleeding.  Rec referral to GYN for eval of endometrial thickness  She will call next week to give [place] an update on how she is feeling.

## 2016-03-24 ENCOUNTER — Other Ambulatory Visit: Payer: Self-pay | Admitting: Obstetrics & Gynecology

## 2016-03-24 DIAGNOSIS — N8500 Endometrial hyperplasia, unspecified: Secondary | ICD-10-CM | POA: Diagnosis not present

## 2016-03-24 DIAGNOSIS — Z124 Encounter for screening for malignant neoplasm of cervix: Secondary | ICD-10-CM | POA: Diagnosis not present

## 2016-03-24 DIAGNOSIS — R938 Abnormal findings on diagnostic imaging of other specified body structures: Secondary | ICD-10-CM | POA: Diagnosis not present

## 2016-03-25 LAB — CYTOLOGY - PAP

## 2016-04-06 ENCOUNTER — Encounter: Payer: Self-pay | Admitting: Internal Medicine

## 2016-04-06 ENCOUNTER — Ambulatory Visit (INDEPENDENT_AMBULATORY_CARE_PROVIDER_SITE_OTHER): Payer: Medicare Other | Admitting: Internal Medicine

## 2016-04-06 DIAGNOSIS — J209 Acute bronchitis, unspecified: Secondary | ICD-10-CM | POA: Diagnosis not present

## 2016-04-06 DIAGNOSIS — J42 Unspecified chronic bronchitis: Secondary | ICD-10-CM

## 2016-04-06 DIAGNOSIS — K219 Gastro-esophageal reflux disease without esophagitis: Secondary | ICD-10-CM

## 2016-04-06 MED ORDER — FLUTICASONE FUROATE-VILANTEROL 100-25 MCG/INH IN AEPB: INHALATION_SPRAY | RESPIRATORY_TRACT | 12 refills | Status: DC

## 2016-04-06 MED ORDER — AZITHROMYCIN 250 MG PO TABS: ORAL_TABLET | ORAL | 0 refills | Status: DC

## 2016-04-06 MED ORDER — BENZONATATE 200 MG PO CAPS: 200.0000 mg | ORAL_CAPSULE | Freq: Three times a day (TID) | ORAL | 1 refills | Status: DC | PRN

## 2016-04-06 NOTE — Progress Notes (Signed)
12/06/13- 67 yoF never smoker( hx prolonged second hand smoke) former [place] Chest pt.w/ chronic bronchitis  08/17/2015-70 year old female never smoker (history prolonged second hand smoke) followed for chronic bronchitis, complicated by R breast cancer/XRT, GERD Follows for: Chronic Bronchitis. Pt c/o cough with yellow/green mucus, some wheeze and SOB when coughing a lot and some chest tightness. Pt denies CP/f/n/v. Pt has finished Zpak but is still using the cough syrup.  She has never really cleared following an acute bronchitis with fever and purulent sputum in January. Has had Z-Pak twice. Sputum still dark but scant. Chest rattles with some wheeze. No more fever and no adenopathy.  39/63//2173-70 year old female never smoker(prolonged second hand smoke) followed for chronic bronchitis, complicated by right breast cancer/XRT, GERD ACUTE VISIT: Cough-productive-yellow in color since Sunday morning; wheezing, SOB, Chest tightness. Pt denies any fever or chills. Woke with cough 3 days ago. Husband also sick. No fever but did have sore throat. CXR 08/17/2015- NAD, mild CE  ROS-see HPI Constitutional:   No-   weight loss, night sweats, fevers, chills, fatigue, lassitude. HEENT:   No-  headaches, difficulty swallowing, tooth/dental problems, sore throat,       No-  sneezing, itching, ear ache, nasal congestion, post nasal drip,  CV:  No-   chest pain, orthopnea, PND, swelling in lower extremities, anasarca,                                                         dizziness, palpitations Resp: +shortness of breath with exertion or at rest.              +-productive cough,  + non-productive cough,  No- coughing up of blood.            +change in color of mucus.  + wheezing.   Skin: No-   rash or lesions. GI:  No-   heartburn, indigestion, abdominal pain, nausea, vomiting, diarrhea,                 change in bowel habits, loss of appetite GU:  MS:  No-   joint pain or swelling.   Neuro-     nothing  unusual Psych:  No- change in mood or affect. No depression or anxiety.  No memory loss.  OBJ- Physical Exam General- Alert, Oriented, Affect-appropriate, Distress- none acute, overweight Skin- rash-none, lesions- none, excoriation- none Lymphadenopathy- none Head- atraumatic            Eyes- Gross vision intact, PERRLA, conjunctivae and secretions clear            Ears- Hearing, canals-normal            Nose- Clear, no-Septal dev, mucus, polyps, erosion, perforation             Throat- Mallampati III , mucosa clear , drainage- none, tonsils- atrophic Neck- flexible , trachea midline, no stridor , thyroid nl, carotid no bruit Chest - symmetrical excursion , unlabored           Heart/CV- RRR , no murmur , no gallop  , no rub, nl s1 s2                           - JVD- none , edema- none, stasis changes- none, varices- none  Lung- clear to P&A, wheeze+little, cough+ harsh , dullness-none, rub- none           Chest wall-  +Hx R lumpectomy/XRT Abd-  Br/ Gen/ Rectal- Not done, not indicated Extrem- cyanosis- none, clubbing, none, atrophy- none, strength- nl Neuro- grossly intact to observation

## 2016-04-06 NOTE — Patient Instructions (Signed)
Script sent refilling [facility] manager sent for Z pak antibiotic  Script sent for Tessalon perles for cough  Neb xop 0.63     Dx acute bronchitis  Depo 80  Please call as needed

## 2016-04-07 NOTE — Assessment & Plan Note (Signed)
Acute tracheobronchitis exacerbation Plan-Z-Pak, Xopenex nebulizer treatment, Depo-Medrol, Tessalon

## 2016-04-07 NOTE — Assessment & Plan Note (Signed)
Review reflux precautions

## 2016-04-12 ENCOUNTER — Encounter (HOSPITAL_COMMUNITY): Admission: RE | Payer: Self-pay | Source: Ambulatory Visit

## 2016-04-12 ENCOUNTER — Ambulatory Visit (HOSPITAL_COMMUNITY)
Admission: RE | Admit: 2016-04-12 | Payer: Medicare Other | Source: Ambulatory Visit | Admitting: Obstetrics & Gynecology

## 2016-04-12 SURGERY — DILATATION & CURETTAGE/HYSTEROSCOPY WITH MYOSURE
Anesthesia: Choice

## 2016-04-17 NOTE — Assessment & Plan Note (Signed)
Right breast DCIS status post lumpectomy 01/26/2012 followed by adjuvant radiation and currently on tamoxifen since 05/08/2012, ER 100%, PR 100%, 1 cm DCIS, low-grade  Tamoxifen toxicities: 1. Mild hot flashes which have since resolved 2. Mild myalgias  Breast Cancer Surveillance: 1. Breast exam 04/18/2016: Normal 2. Mammogram 10/27/15 No abnormalities. Postsurgical changes. Breast Density Category B. I recommended that she get 3-D mammograms for surveillance. Discussed the differences between different breast density categories.  Return to clinic in 1 year for follow-up

## 2016-04-19 ENCOUNTER — Ambulatory Visit (HOSPITAL_BASED_OUTPATIENT_CLINIC_OR_DEPARTMENT_OTHER): Payer: Medicare Other | Admitting: Hematology and Oncology

## 2016-04-19 ENCOUNTER — Encounter: Payer: Self-pay | Admitting: Hematology and Oncology

## 2016-04-19 DIAGNOSIS — Z79810 Long term (current) use of selective estrogen receptor modulators (SERMs): Secondary | ICD-10-CM

## 2016-04-19 DIAGNOSIS — D0511 Intraductal carcinoma in situ of right breast: Secondary | ICD-10-CM | POA: Diagnosis not present

## 2016-04-19 DIAGNOSIS — Z170 Estrogen receptor positive status [ER+]: Secondary | ICD-10-CM

## 2016-04-19 DIAGNOSIS — D0591 Unspecified type of carcinoma in situ of right breast: Secondary | ICD-10-CM

## 2016-04-19 NOTE — Progress Notes (Signed)
Patient Care Team: [name], MD as PCP - General  DIAGNOSIS:  Encounter Diagnosis  Name Primary?  . Cancer of right breast, stage 0     SUMMARY OF ONCOLOGIC HISTORY:   Cancer of right breast, stage 0   01/10/2012 Initial Diagnosis    Right breast biopsy: DCIS mammogram ultrasound 9 mm abnormality ER 100% PR 100%      01/26/2012 Surgery    Right lumpectomy: DCIS low-grade 1 cm, with a ledge, margins negative, 0/3 sentinel nodes negative, ER 100%, PR 100% stage 0      02/27/2012 - 04/04/2012 Radiation Therapy    Adjuvant radiation therapy      05/08/2012 -  Anti-estrogen oral therapy    Tamoxifen 20 mg daily       CHIEF COMPLIANT: Follow-up on tamoxifen therapy  INTERVAL HISTORY: Katie Zimmerman is a 70 year old with above-mentioned history of right breast cancer treated with lumpectomy adjuvant radiation and is currently on tamoxifen. She will finish 5 years of tamoxifen therapy with December 2018. She is tolerating tamoxifen fairly well except for mild hot flashes and arthralgias. Denies any lumps or nodules in the breasts.Patient has extensive problems in October with urinary infection and bronchitis. She also had vaginal exams which revealed uterine thickening and hyperplasia. She had a biopsy.  REVIEW OF SYSTEMS:   Constitutional: Denies fevers, chills or abnormal weight loss Eyes: Denies blurriness of vision Ears, nose, mouth, throat, and face: Denies mucositis or sore throat Respiratory: Denies cough, dyspnea or wheezes Cardiovascular: Denies palpitation, chest discomfort Gastrointestinal:  Denies nausea, heartburn or change in bowel habits Skin: Denies abnormal skin rashes Lymphatics: Denies new lymphadenopathy or easy bruising Neurological:Denies numbness, tingling or new weaknesses Behavioral/Psych: Mood is stable, no new changes  Extremities: No lower extremity edema Breast:  denies any pain or lumps or nodules in either breasts All other systems were reviewed  with the patient and are negative.  I have reviewed the past medical history, past surgical history, social history and family history with the patient and they are unchanged from previous note.  ALLERGIES:  is allergic to penicillins; venlafaxine; and sulfonamide derivatives.  MEDICATIONS:  Current Outpatient Prescriptions  Medication Sig Dispense Refill  . acetaminophen (TYLENOL) 500 MG tablet Take 1,000 mg by mouth every 4 (four) hours as needed for mild pain, moderate pain or headache.     . albuterol (PROVENTIL HFA;VENTOLIN HFA) 108 (90 BASE) MCG/ACT inhaler Inhale 2 puffs into the lungs every 6 (six) hours as needed for wheezing. 1 Inhaler prn  . azithromycin (ZITHROMAX) 250 MG tablet 2 today then one daily 6 tablet 0  . benzonatate (TESSALON) 200 MG capsule Take 1 capsule (200 mg total) by mouth 3 (three) times daily as needed for cough. 30 capsule 1  . cholecalciferol (VITAMIN D) 1000 units tablet Take 4,000 Units by mouth daily.    . fluticasone furoate-vilanterol (BREO ELLIPTA) 100-25 MCG/INH AEPB Inhale 1 puff then rinse mouth, once daily 60 each 12  . gabapentin (NEURONTIN) 300 MG capsule Take 300 mg by mouth 2 (two) times daily.    . promethazine (PHENERGAN) 25 MG tablet Take 1 tablet (25 mg total) by mouth every 8 (eight) hours as needed for nausea or vomiting. 20 tablet 0  . tamoxifen (NOLVADEX) 20 MG tablet Take 20 mg by mouth daily.     No current facility-administered medications for this visit.     PHYSICAL EXAMINATION: ECOG PERFORMANCE STATUS: 0 - Asymptomatic  Vitals:   04/19/16 1432  BP: 137/77  Pulse: 85  Resp: 18  Temp: 98.9 F (37.2 C)   Filed Weights   04/19/16 1432  Weight: 160 lb 1.6 oz (72.6 kg)    GENERAL:alert, no distress and comfortable SKIN: skin color, texture, turgor are normal, no rashes or significant lesions EYES: normal, Conjunctiva are pink and non-injected, sclera clear OROPHARYNX:no exudate, no erythema and lips, buccal mucosa, and  tongue normal  NECK: supple, thyroid normal size, non-tender, without nodularity LYMPH:  no palpable lymphadenopathy in the cervical, axillary or inguinal LUNGS: clear to auscultation and percussion with normal breathing effort HEART: regular rate & rhythm and no murmurs and no lower extremity edema ABDOMEN:abdomen soft, non-tender and normal bowel sounds MUSCULOSKELETAL:no cyanosis of digits and no clubbing  NEURO: alert & oriented x 3 with fluent speech, no focal motor/sensory deficits EXTREMITIES: No lower extremity edema BREAST: No palpable masses or nodules in either right or left breasts. No palpable axillary supraclavicular or infraclavicular adenopathy no breast tenderness or nipple discharge. (exam performed in the presence of a chaperone)  LABORATORY DATA:  I have reviewed the data as listed   Chemistry      Component Value Date/Time   NA 138 03/05/2016 1541   NA 144 02/20/2014 1133   K 4.0 03/05/2016 1541   K 3.9 02/20/2014 1133   CL 107 03/05/2016 1541   CL 110 (H) 11/12/2012 1216   CO2 19 (L) 03/05/2016 1541   CO2 26 02/20/2014 1133   BUN 16 03/05/2016 1541   BUN 15.5 02/20/2014 1133   CREATININE 1.05 (H) 03/05/2016 1541   CREATININE 0.9 02/20/2014 1133      Component Value Date/Time   CALCIUM 9.7 03/05/2016 1541   CALCIUM 9.5 02/20/2014 1133   ALKPHOS 71 03/05/2016 1541   ALKPHOS 73 02/20/2014 1133   AST 41 03/05/2016 1541   AST 19 02/20/2014 1133   ALT 44 03/05/2016 1541   ALT 21 02/20/2014 1133   BILITOT 0.6 03/05/2016 1541   BILITOT 0.34 02/20/2014 1133       Lab Results  Component Value Date   WBC 10.4 03/05/2016   HGB 16.1 (H) 03/05/2016   HCT 49.9 (H) 03/05/2016   MCV 92.6 03/05/2016   PLT 360 03/05/2016   NEUTROABS 4.4 02/20/2014     ASSESSMENT & PLAN:  Cancer of right breast, stage 0 Right breast DCIS status post lumpectomy 01/26/2012 followed by adjuvant radiation and currently on tamoxifen since 05/08/2012, ER 100%, PR 100%, 1 cm DCIS,  low-grade  Tamoxifen toxicities: 1. Mild hot flashes which have since resolved 2. Mild myalgias 3. Uterine hyperplasia: Patient follows GYN.  Breast Cancer Surveillance: 1. Breast exam 04/18/2016: Normal 2. Mammogram 10/27/15 No abnormalities. Postsurgical changes. Breast Density Category B. I recommended that she get 3-D mammograms for surveillance. Discussed the differences between different breast density categories.  Return to clinic in 1 year for follow-up and that would be the end of her tamoxifen therapy.   No orders of the defined types were placed in this encounter.  The patient has a good understanding of the overall plan. she agrees with it. she will call with any problems that may develop before the next visit here.   Rulon Eisenmenger, MD 04/19/16

## 2016-04-25 ENCOUNTER — Other Ambulatory Visit: Payer: Self-pay | Admitting: Hematology and Oncology

## 2016-04-25 DIAGNOSIS — D0510 Intraductal carcinoma in situ of unspecified breast: Secondary | ICD-10-CM

## 2016-05-02 ENCOUNTER — Telehealth: Payer: Self-pay | Admitting: Internal Medicine

## 2016-05-02 MED ORDER — DOXYCYCLINE HYCLATE 100 MG PO TABS: 100.0000 mg | ORAL_TABLET | Freq: Two times a day (BID) | ORAL | 0 refills | Status: DC

## 2016-05-02 MED ORDER — PREDNISONE 20 MG PO TABS: 20.0000 mg | ORAL_TABLET | Freq: Every day | ORAL | 0 refills | Status: DC

## 2016-05-02 NOTE — Telephone Encounter (Signed)
Patient is returning phone call.  °

## 2016-05-02 NOTE — Telephone Encounter (Signed)
Spoke with pt, states she is still feeling unwell- c/o unchanged SOB, chest tightness, prod cough with yellow/green mucus. Denies fever, sinus congestion.   Pt was seen 11/8 for the same symptoms and given zpak, tessalon perles, depo 80 and xopenex tx in office, which improved symptoms initially but now she is back to how she felt at office visit.  Requesting further recs.   Pt uses [facility] please advise on recs.  Thanks!   Allergies  Allergen Reactions  . Penicillins Anaphylaxis, Rash and Other (See Comments)    Has patient had a PCN reaction causing immediate rash, facial/tongue/throat swelling, SOB or lightheadedness with hypotension: Yes Has patient had a PCN reaction causing severe rash involving mucus membranes or skin necrosis: No Has patient had a PCN reaction that required hospitalization No Has patient had a PCN reaction occurring within the last 10 years: No If all of the above answers are "NO", then may proceed with Cephalosporin use.  . Venlafaxine Nausea Only  . Sulfonamide Derivatives Rash   Current Outpatient Prescriptions on File Prior to Visit  Medication Sig Dispense Refill  . acetaminophen (TYLENOL) 500 MG tablet Take 1,000 mg by mouth every 4 (four) hours as needed for mild pain, moderate pain or headache.     . albuterol (PROVENTIL HFA;VENTOLIN HFA) 108 (90 BASE) MCG/ACT inhaler Inhale 2 puffs into the lungs every 6 (six) hours as needed for wheezing. 1 Inhaler prn  . azithromycin (ZITHROMAX) 250 MG tablet 2 today then one daily 6 tablet 0  . benzonatate (TESSALON) 200 MG capsule Take 1 capsule (200 mg total) by mouth 3 (three) times daily as needed for cough. 30 capsule 1  . cholecalciferol (VITAMIN D) 1000 units tablet Take 4,000 Units by mouth daily.    . fluticasone furoate-vilanterol (BREO ELLIPTA) 100-25 MCG/INH AEPB Inhale 1 puff then rinse mouth, once daily 60 each 12  . gabapentin (NEURONTIN) 300 MG capsule Take 300 mg by mouth 2 (two) times  daily.    . promethazine (PHENERGAN) 25 MG tablet Take 1 tablet (25 mg total) by mouth every 8 (eight) hours as needed for nausea or vomiting. 20 tablet 0  . tamoxifen (NOLVADEX) 20 MG tablet Take 20 mg by mouth daily.    . tamoxifen (NOLVADEX) 20 MG tablet TAKE 1 TABLET (20 MG TOTAL) BY MOUTH DAILY. 90 tablet 0   No current facility-administered medications on file prior to visit.

## 2016-05-02 NOTE — Telephone Encounter (Signed)
Offer doxycycline 100 mg, # 14, 1 twice daily            Prednisone 20 mg, # 5, 1 daily

## 2016-05-02 NOTE — Telephone Encounter (Signed)
lmtcb X1 for patient 

## 2016-05-10 ENCOUNTER — Telehealth: Payer: Self-pay | Admitting: Internal Medicine

## 2016-05-10 MED ORDER — LEVOFLOXACIN 500 MG PO TABS: 500.0000 mg | ORAL_TABLET | Freq: Every day | ORAL | 0 refills | Status: DC

## 2016-05-10 NOTE — Telephone Encounter (Signed)
Offer levaquin 500 mg, # 7, 1 daily 

## 2016-05-10 NOTE — Telephone Encounter (Addendum)
CY  Please Advise-Sick Message  Pt. States she has finsihed the medicaiton that awas prescribed to her but she does not feel any better. She is still coughing with thick yellow mucus, c/o congestion, hears a rattling in her chest, SOB has remained unchanged, Denies fever,chest tightness   Conversation: sick  (Newest Message First)  Deneise Lever, MD  to [facility] Triage Pool     05/02/16 11:55 AM  Note    Offer doxycycline 100 mg, # 14, 1 twice daily            Prednisone 20 mg, # 5, 1 daily

## 2016-05-10 NOTE — Telephone Encounter (Signed)
Pt called back and she is aware of rx for the levaquin that has been sent to the pharmacy. Nothing further is needed.

## 2016-05-10 NOTE — Telephone Encounter (Signed)
LMOMTCB x 1 

## 2016-05-24 ENCOUNTER — Other Ambulatory Visit: Payer: Self-pay | Admitting: Family Medicine

## 2016-05-25 NOTE — Telephone Encounter (Signed)
Ok to refill? Electronically refill request for   gabapentin (NEURONTIN) 300 MG capsule  Last prescribed on 07/21/2015. This medication was discontinued by Dr Diona Browner on 12/08/2015. However, it is back on the current medication list.

## 2016-05-27 NOTE — Telephone Encounter (Signed)
Call pt to verify if she is taking lyrica or gabapentin for post herpetic neuralgia? If she is using gabapentin.Loistine Chance lyrica from list and okay to refill as she is using for 6 months.

## 2016-05-27 NOTE — Telephone Encounter (Signed)
Lm on pts vm requesting a call back 

## 2016-05-31 NOTE — Telephone Encounter (Signed)
Spoke with Ms. Vetsch.  She states she went without the Lyrica and Gabapentin for the month of October.  She states she was not having the pain.  She went ahead and refilled the Gabapentin due to she had refills and this pharmacy would deliver the medications to her since she had not been getting out much.  She states she does not currently need refills on the Gabapentin.  She wants to wait and see how things go.  Medication list updated.  Refill denied.

## 2016-06-15 ENCOUNTER — Other Ambulatory Visit: Payer: Self-pay | Admitting: Family Medicine

## 2016-07-13 ENCOUNTER — Other Ambulatory Visit: Payer: Self-pay | Admitting: Family Medicine

## 2016-07-13 NOTE — Telephone Encounter (Signed)
Last office visit 03/15/2016.  Ok to refill?

## 2016-07-23 ENCOUNTER — Emergency Department (HOSPITAL_COMMUNITY): Payer: Medicare Other

## 2016-07-23 ENCOUNTER — Emergency Department (HOSPITAL_COMMUNITY)
Admission: EM | Admit: 2016-07-23 | Discharge: 2016-07-23 | Disposition: A | Discharge status: Home / Self Care | Payer: Medicare Other | Attending: Emergency Medicine | Admitting: Emergency Medicine

## 2016-07-23 ENCOUNTER — Encounter (HOSPITAL_COMMUNITY): Payer: Self-pay

## 2016-07-23 DIAGNOSIS — S63502A Unspecified sprain of left wrist, initial encounter: Secondary | ICD-10-CM | POA: Diagnosis not present

## 2016-07-23 DIAGNOSIS — W01198A Fall on same level from slipping, tripping and stumbling with subsequent striking against other object, initial encounter: Secondary | ICD-10-CM | POA: Insufficient documentation

## 2016-07-23 DIAGNOSIS — Y92410 Unspecified street and highway as the place of occurrence of the external cause: Secondary | ICD-10-CM | POA: Insufficient documentation

## 2016-07-23 DIAGNOSIS — S6992XA Unspecified injury of left wrist, hand and finger(s), initial encounter: Secondary | ICD-10-CM | POA: Diagnosis not present

## 2016-07-23 DIAGNOSIS — M25532 Pain in left wrist: Secondary | ICD-10-CM | POA: Diagnosis not present

## 2016-07-23 DIAGNOSIS — Y999 Unspecified external cause status: Secondary | ICD-10-CM | POA: Insufficient documentation

## 2016-07-23 DIAGNOSIS — Z853 Personal history of malignant neoplasm of breast: Secondary | ICD-10-CM | POA: Insufficient documentation

## 2016-07-23 DIAGNOSIS — M79632 Pain in left forearm: Secondary | ICD-10-CM | POA: Diagnosis not present

## 2016-07-23 DIAGNOSIS — Z79899 Other long term (current) drug therapy: Secondary | ICD-10-CM | POA: Diagnosis not present

## 2016-07-23 DIAGNOSIS — Y939 Activity, unspecified: Secondary | ICD-10-CM | POA: Diagnosis not present

## 2016-07-23 DIAGNOSIS — S59912A Unspecified injury of left forearm, initial encounter: Secondary | ICD-10-CM | POA: Diagnosis not present

## 2016-07-23 MED ORDER — HYDROCODONE-ACETAMINOPHEN 5-325 MG PO TABS: 1.0000 | ORAL_TABLET | Freq: Four times a day (QID) | ORAL | 0 refills | Status: DC | PRN

## 2016-07-23 NOTE — ED Triage Notes (Signed)
Pt endorses left hand/wrist/forearm pain that began after the pt had a mechanical fall this morning. Pt unable to make a fist, has full ROM at the elbow. CMS intact. VSS. Pt denies dizziness prior to fall.

## 2016-07-23 NOTE — Progress Notes (Signed)
Orthopedic Tech Progress Note Patient Details:  Katie Zimmerman 1945-08-05 [id number]  Ortho Devices Type of Ortho Device: Thumb velcro splint Ortho Device/Splint Location: lue Ortho Device/Splint Interventions: Ordered, Application   Karolee Stamps 07/23/2016, 11:10 PM

## 2016-07-23 NOTE — ED Notes (Signed)
Pt has splint from home applied to left wrist and thumb

## 2016-07-23 NOTE — Discharge Instructions (Signed)
You need to have a repeat x-ray in 1 week to ensure that you don't have a scaphoid bone injury.

## 2016-07-23 NOTE — ED Notes (Signed)
Patient Alert and oriented X4. Stable and ambulatory. Patient verbalized understanding of the discharge instructions.  Patient belongings were taken by the patient.  

## 2016-07-23 NOTE — ED Provider Notes (Signed)
[facility] DEPT Provider Note   CSN: [id number] Arrival date & time: 07/23/16  1919  By signing my name below, I, Margit Banda and Hansel Feinstein, attest that this documentation has been prepared under the direction and in the presence of Montine Circle, PA-C.  Electronically Signed: Margit Banda and Hansel Feinstein, ED Scribe. 07/23/16. 9:50 PM.   History   Chief Complaint Chief Complaint  Patient presents with  . Wrist Injury    HPI Katie Zimmerman is a 71 y.o. female who presents to the Emergency Department complaining moderate left wrist pain and swelling s/p mechanical fall this morning. Pt reports she slipped on a wet metal wheelchair ramp at her house causing her to fall backward and land on her buttock and outstretched left wrist. Pt denies dizziness prior to falling, LOC or head injury. Secondary to left wrist pain, pt complains of mild buttock pain resulting from her fall this morning. Pt states she applied a brace at home with some relief and notes her pain is worsened with wrist movement. She denies elbow or shoulder pain, additional injuries.    The history is provided by the patient. No language interpreter was used.    Past Medical History:  Diagnosis Date  . Breast cancer ([place]) 12/2011  . Breast cancer ([place]) 12/2011   DCIS  . Complication of anesthesia    hard to wake up age 24-not since  . Fibromyalgia   . GERD (gastroesophageal reflux disease)   . History of radiation therapy 02/29/12 -04/21/12   right breast, 5040 cGy in 28 fx, boost to total 6240 cGy  . Osteoporosis   . Personal history of colonic polyps-adenomas 05/25/2012  . PHN (postherpetic neuralgia) 2000   Right side of face    Patient Active Problem List   Diagnosis Date Noted  . Increased endometrial stripe thickness 03/18/2016  . LLQ pain 03/15/2016  . Urinary tract infection 03/15/2016  . [place] arthritis 01/22/2015  . Counseling regarding end of life decision making 01/22/2015  . Osteoporosis  01/28/2014  . Hearing loss in right ear 01/20/2014  . Chronic bronchitis with acute exacerbation ([place]) 12/06/2013  . Personal history of colonic polyps-adenomas 05/25/2012  . GERD (gastroesophageal reflux disease)   . History of radiation therapy   . Cancer of right breast, stage 0 01/26/2012  . Prediabetes 11/22/2011  . High cholesterol 11/22/2011  . TEMPOROMANDIBULAR JOINT PAIN 11/04/2009  . BRONCHITIS, CHRONIC 05/04/2009  . RENAL CALCULUS 05/04/2009  . Post herpetic neuralgia 04/14/2009    Past Surgical History:  Procedure Laterality Date  . APPENDECTOMY    . BIOPSY BREAST  01/10/12   Right breast needle cor biopsy  . BREAST LUMPECTOMY  01/26/2012   Right/ERPR+ DCIS, right ax snbx  . FOOT SURGERY  1996   rt/lt great toes  . GLAUCOMA SURGERY  1993   laser  . OVARIAN CYST SURGERY  1964  . TUBAL LIGATION  1976    OB History    No data available       Home Medications    Prior to Admission medications   Medication Sig Start Date End Date Taking? Authorizing Provider  acetaminophen (TYLENOL) 500 MG tablet Take 1,000 mg by mouth every 4 (four) hours as needed for mild pain, moderate pain or headache.     Historical Provider, MD  albuterol (PROVENTIL HFA;VENTOLIN HFA) 108 (90 BASE) MCG/ACT inhaler Inhale 2 puffs into the lungs every 6 (six) hours as needed for wheezing. 01/22/15   Amy Cletis [place], MD  azithromycin (ZITHROMAX) 250 MG tablet 2 today then one daily 04/06/16   Deneise Lever, MD  benzonatate (TESSALON) 200 MG capsule Take 1 capsule (200 mg total) by mouth 3 (three) times daily as needed for cough. 04/06/16   Deneise Lever, MD  cholecalciferol (VITAMIN D) 1000 units tablet Take 4,000 Units by mouth daily.    Historical Provider, MD  doxycycline (VIBRA-TABS) 100 MG tablet Take 1 tablet (100 mg total) by mouth 2 (two) times daily. 05/02/16   Deneise Lever, MD  fluticasone furoate-vilanterol (BREO ELLIPTA) 100-25 MCG/INH AEPB Inhale 1 puff then rinse mouth, once daily  04/06/16   Deneise Lever, MD  gabapentin (NEURONTIN) 300 MG capsule Take 300 mg by mouth 2 (two) times daily.    Historical Provider, MD  gabapentin (NEURONTIN) 300 MG capsule TAKE TWO CAPSULES BY MOUTH IN THE MORNING AND FOUR CAPSULE AT BEDTIME 07/14/16   Amy E Diona Browner, MD  levofloxacin (LEVAQUIN) 500 MG tablet Take 1 tablet (500 mg total) by mouth daily. 05/10/16   Deneise Lever, MD  predniSONE (DELTASONE) 20 MG tablet Take 1 tablet (20 mg total) by mouth daily with breakfast. 05/02/16   Deneise Lever, MD  promethazine (PHENERGAN) 25 MG tablet Take 1 tablet (25 mg total) by mouth every 8 (eight) hours as needed for nausea or vomiting. 03/15/16   Amy Cletis [place], MD  tamoxifen (NOLVADEX) 20 MG tablet Take 20 mg by mouth daily.    Historical Provider, MD  tamoxifen (NOLVADEX) 20 MG tablet TAKE 1 TABLET (20 MG TOTAL) BY MOUTH DAILY. 04/26/16   Nicholas Lose, MD    Family History Family History  Problem Relation Age of Onset  . Hypertension Mother   . Cancer Mother     LIVER cancer  . Hypertension Father   . Diabetes Father   . Heart failure Father   . Breast cancer Sister 81    NOT hormone receptor breast cancer  . Breast cancer Maternal Aunt 75  . Breast cancer Other     dx in her 38s  . Breast cancer Cousin 66    maternal cousin  . Breast cancer Cousin     maternal cousin  . Breast cancer Cousin     maternal cousin  . Cancer Cousin     unknown form of cancer; maternal cousin  . Cancer Cousin     unknown form of cancer; maternal cousin  . Cancer Cousin 11    ? lung cancer; maternal cousin  . Breast cancer Cousin     dx 49s-50s; paternal cousin  . Breast cancer Cousin     dx in her 79s; paternal cousin  . Colon cancer Neg Hx   . Stomach cancer Neg Hx     Social History Social History  Substance Use Topics  . Smoking status: Never Smoker  . Smokeless tobacco: Never Used  . Alcohol use Yes     Comment: occasional glass of wine     Allergies   Penicillins;  Venlafaxine; and Sulfonamide derivatives   Review of Systems Review of Systems  Musculoskeletal: Positive for arthralgias (left wrist) and joint swelling (left wrist).  Neurological: Negative for dizziness, syncope and headaches.     Physical Exam Updated Vital Signs BP 156/92 (BP Location: Right Arm)   Pulse 91   Temp 98 F (36.7 C) (Oral)   Resp 19   Ht 4\' 11"  (1.499 m)   Wt 160 lb (72.6 kg)   SpO2 98%  BMI 32.32 kg/m   Physical Exam Nursing note and vitals reviewed.  Constitutional: Pt appears well-developed and well-nourished. No distress.  HENT:  Head: Normocephalic and atraumatic.  Eyes: Conjunctivae are normal.  Neck: Normal range of motion.  Cardiovascular: Normal rate, regular rhythm. Intact distal pulses.   Capillary refill < 3 sec.  Pulmonary/Chest: Effort normal and breath sounds normal.  Musculoskeletal:  Left Wrist Pt exhibits tenderness to palpation about left wrist, also tender to palpation over the anatomical snuff box, no obvious bony deformity or abnormality.   ROM: 4/5 limited by pain  Strength: 4/5 limited by pain  She ambulates without diffiulty Neurological: Pt  is alert. Coordination normal.  Sensation: 5/5 Skin: Skin is warm and dry. Pt is not diaphoretic.  No evidence of open wound or skin tenting Psychiatric: Pt has a normal mood and affect.     ED Treatments / Results  DIAGNOSTIC STUDIES: Oxygen Saturation is 98% on RA, normal by my interpretation.   COORDINATION OF CARE: 9:39 PM-Discussed next steps with pt which includes a thumb spica. Pt verbalized understanding and is agreeable with the plan.    Labs (all labs ordered are listed, but only abnormal results are displayed) Labs Reviewed - No data to display  EKG  EKG Interpretation None       Radiology Dg Forearm Left  Result Date: 07/23/2016 CLINICAL DATA:  Fall tonight with left forearm pain. EXAM: LEFT FOREARM - 2 VIEW COMPARISON:  None. FINDINGS: There is no  evidence of fracture or other focal bone lesions. Soft tissues are unremarkable. IMPRESSION: Negative. Electronically Signed   By: Marin Olp M.D.   On: 07/23/2016 21:11   Dg Wrist Complete Left  Result Date: 07/23/2016 CLINICAL DATA:  Fall tonight with left forearm, wrist and thumb pain. EXAM: LEFT WRIST - COMPLETE 3+ VIEW COMPARISON:  None. FINDINGS: Mild degenerate change of the radiocarpal joint and carpal bones. Moderate degenerate change of the first carpometacarpal joint. No evidence of acute fracture or dislocation. IMPRESSION: No acute findings. Electronically Signed   By: Marin Olp M.D.   On: 07/23/2016 21:10   Dg Hand Complete Left  Result Date: 07/23/2016 CLINICAL DATA:  Fall tonight with left forearm and thumb pain. EXAM: LEFT HAND - COMPLETE 3+ VIEW COMPARISON:  None. FINDINGS: Moderate degenerative change over the first carpometacarpal joint. Mild degenerate changes of the radiocarpal joint and carpal bones. Mild degenerate changes of the interphalangeal joints. No acute fracture or dislocation. IMPRESSION: No acute fracture. Degenerative changes as described. Electronically Signed   By: Marin Olp M.D.   On: 07/23/2016 21:09    Procedures Procedures (including critical care time)  Medications Ordered in ED Medications - No data to display   Initial Impression / Assessment and Plan / ED Course  I have reviewed the triage vital signs and the nursing notes.  Pertinent labs & imaging results that were available during my care of the patient were reviewed by me and considered in my medical decision making (see chart for details).     Patient X-Ray negative for obvious fracture or dislocation. She does have some tenderness to palpation over the anatomical snuffbox and scaphoid injury cannot be ruled out. I recommend that she follow-up with her orthopedic hand specialist in one week for repeat imaging. I will place patient in a thumb spica splint and prescribe Vicodin for  pain control. Patient will be discharged home & is agreeable with above plan. Returns precautions discussed. Pt appears safe for discharge.  Final Clinical Impressions(s) / ED Diagnoses   Final diagnoses:  Sprain of left wrist, initial encounter    New Prescriptions New Prescriptions   HYDROCODONE-ACETAMINOPHEN (NORCO/VICODIN) 5-325 MG TABLET    Take 1-2 tablets by mouth every 6 (six) hours as needed.   I personally performed the services described in this documentation, which was scribed in my presence. The recorded information has been reviewed and is accurate.       Montine Circle, PA-C 07/23/16 [place], MD 07/25/16 [phone]

## 2016-07-29 DIAGNOSIS — M1812 Unilateral primary osteoarthritis of first carpometacarpal joint, left hand: Secondary | ICD-10-CM | POA: Diagnosis not present

## 2016-08-01 ENCOUNTER — Other Ambulatory Visit: Payer: Self-pay | Admitting: Emergency Medicine

## 2016-08-01 MED ORDER — TAMOXIFEN CITRATE 20 MG PO TABS: 20.0000 mg | ORAL_TABLET | Freq: Every day | ORAL | 3 refills | Status: DC

## 2016-08-04 ENCOUNTER — Ambulatory Visit: Payer: Medicare Other | Admitting: Family Medicine

## 2016-08-05 ENCOUNTER — Other Ambulatory Visit: Payer: Self-pay | Admitting: Family Medicine

## 2016-08-05 NOTE — Telephone Encounter (Signed)
Last office visit 03/15/2016.  Last refilled 07/14/2016 for #240 with 1 refill.  Ok to refill?

## 2016-08-09 ENCOUNTER — Encounter: Payer: Self-pay | Admitting: Family Medicine

## 2016-08-09 ENCOUNTER — Ambulatory Visit (INDEPENDENT_AMBULATORY_CARE_PROVIDER_SITE_OTHER): Payer: Medicare Other | Admitting: Family Medicine

## 2016-08-09 DIAGNOSIS — Z6836 Body mass index (BMI) 36.0-36.9, adult: Secondary | ICD-10-CM | POA: Insufficient documentation

## 2016-08-09 DIAGNOSIS — Z6833 Body mass index (BMI) 33.0-33.9, adult: Secondary | ICD-10-CM

## 2016-08-09 DIAGNOSIS — B0229 Other postherpetic nervous system involvement: Secondary | ICD-10-CM | POA: Diagnosis not present

## 2016-08-09 NOTE — Assessment & Plan Note (Signed)
Good control on gabapentin.

## 2016-08-09 NOTE — Progress Notes (Signed)
Pre visit review using our clinic review tool, if applicable. No additional management support is needed unless otherwise documented below in the visit note. 

## 2016-08-09 NOTE — Progress Notes (Signed)
   Subjective:    Patient ID: Katie Zimmerman, female    DOB: 12-Sep-1945, 71 y.o.   MRN: [id number]  HPI  71 year old female presents for follow up 6 months.  Bronchitis and UTI resolved.   Recent sprain of left wrist. Slowly improving.  Wearing brace thumb spica.  Has follow up with Dr. Apolonio Schneiders upcoming.  Post herpetic neuralgia: She went back to gabapentin given she has some refills. Seems to work as well at this point. Plans to continue. If flares back up .Marland Kitchen May need to return to lyrica.  Working on [employer], occ exercise.  Blood pressure 130/88, pulse 81, temperature 99.1 F (37.3 C), temperature source Oral, height 4' 10.25" (1.48 m), weight 163 lb 8 oz (74.2 kg).  Review of Systems  Constitutional: Negative for fatigue and fever.  HENT: Negative for congestion.   Eyes: Negative for pain.  Respiratory: Negative for cough and shortness of breath.   Cardiovascular: Negative for chest pain, palpitations and leg swelling.  Gastrointestinal: Negative for abdominal pain.  Genitourinary: Negative for dysuria and vaginal bleeding.  Musculoskeletal: Negative for back pain.  Neurological: Negative for syncope, light-headedness and headaches.  Psychiatric/Behavioral: Negative for dysphoric mood.       Objective:   Physical Exam  Constitutional: Vital signs are normal. She appears well-developed and well-nourished. She is cooperative.  Non-toxic appearance. She does not appear ill. No distress.  obese  HENT:  Head: Normocephalic.  Right Ear: Hearing, tympanic membrane, external ear and ear canal normal. Tympanic membrane is not erythematous, not retracted and not bulging.  Left Ear: Hearing, tympanic membrane, external ear and ear canal normal. Tympanic membrane is not erythematous, not retracted and not bulging.  Nose: No mucosal edema or rhinorrhea. Right sinus exhibits no maxillary sinus tenderness and no frontal sinus tenderness. Left sinus exhibits no maxillary sinus tenderness  and no frontal sinus tenderness.  Mouth/Throat: Uvula is midline, oropharynx is clear and moist and mucous membranes are normal.  Eyes: Conjunctivae, EOM and lids are normal. Pupils are equal, round, and reactive to light. Lids are everted and swept, no foreign bodies found.  Neck: Trachea normal and normal range of motion. Neck supple. Carotid bruit is not present. No thyroid mass and no thyromegaly present.  Cardiovascular: Normal rate, regular rhythm, S1 normal, S2 normal, normal heart sounds, intact distal pulses and normal pulses.  Exam reveals no gallop and no friction rub.   No murmur heard. Pulmonary/Chest: Effort normal and breath sounds normal. No tachypnea. No respiratory distress. She has no decreased breath sounds. She has no wheezes. She has no rhonchi. She has no rales.  Abdominal: Soft. Normal appearance and bowel sounds are normal. There is no tenderness.  Musculoskeletal:  Brace on left forearm  Neurological: She is alert.  Skin: Skin is warm, dry and intact. No rash noted.  Psychiatric: Her speech is normal and behavior is normal. Judgment and thought content normal. Her mood appears not anxious. Cognition and memory are normal. She does not exhibit a depressed mood.    Body mass index is 33.88 kg/m.      Assessment & Plan:

## 2016-08-09 NOTE — Assessment & Plan Note (Signed)
Encouraged exercise, weight loss, healthy eating habits. ? ?

## 2016-08-23 ENCOUNTER — Telehealth: Payer: Self-pay

## 2016-08-23 ENCOUNTER — Emergency Department (HOSPITAL_COMMUNITY): Payer: Medicare Other

## 2016-08-23 ENCOUNTER — Emergency Department (HOSPITAL_COMMUNITY)
Admission: EM | Admit: 2016-08-23 | Discharge: 2016-08-23 | Disposition: A | Discharge status: Home / Self Care | Payer: Medicare Other | Attending: Emergency Medicine | Admitting: Emergency Medicine

## 2016-08-23 ENCOUNTER — Encounter (HOSPITAL_COMMUNITY): Payer: Self-pay

## 2016-08-23 DIAGNOSIS — Z853 Personal history of malignant neoplasm of breast: Secondary | ICD-10-CM | POA: Insufficient documentation

## 2016-08-23 DIAGNOSIS — R112 Nausea with vomiting, unspecified: Secondary | ICD-10-CM | POA: Diagnosis not present

## 2016-08-23 DIAGNOSIS — R109 Unspecified abdominal pain: Secondary | ICD-10-CM | POA: Diagnosis not present

## 2016-08-23 DIAGNOSIS — R197 Diarrhea, unspecified: Secondary | ICD-10-CM | POA: Diagnosis not present

## 2016-08-23 DIAGNOSIS — R1110 Vomiting, unspecified: Secondary | ICD-10-CM | POA: Diagnosis not present

## 2016-08-23 DIAGNOSIS — Z79899 Other long term (current) drug therapy: Secondary | ICD-10-CM | POA: Diagnosis not present

## 2016-08-23 LAB — COMPREHENSIVE METABOLIC PANEL
ALT: 43 U/L (ref 14–54)
AST: 41 U/L (ref 15–41)
Albumin: 4 g/dL (ref 3.5–5.0)
Alkaline Phosphatase: 61 U/L (ref 38–126)
Anion gap: 12 (ref 5–15)
BUN: 14 mg/dL (ref 6–20)
CO2: 24 mmol/L (ref 22–32)
Calcium: 9.6 mg/dL (ref 8.9–10.3)
Chloride: 106 mmol/L (ref 101–111)
Creatinine, Ser: 0.93 mg/dL (ref 0.44–1.00)
GFR calc Af Amer: >60 mL/min (ref >60)
GFR calc non Af Amer: >60 mL/min (ref >60)
Glucose, Bld: 163 mg/dL — ABNORMAL HIGH (ref 65–99)
Potassium: 4.6 mmol/L (ref 3.5–5.1)
Sodium: 142 mmol/L (ref 135–145)
Total Bilirubin: 0.6 mg/dL (ref 0.3–1.2)
Total Protein: 7.1 g/dL (ref 6.5–8.1)

## 2016-08-23 LAB — CBC WITH DIFFERENTIAL/PLATELET
Basophils Absolute: 0 10*3/uL (ref 0.0–0.1)
Basophils Relative: 0 %
Eosinophils Absolute: 0 10*3/uL (ref 0.0–0.7)
Eosinophils Relative: 0 %
HCT: 43.5 % (ref 36.0–46.0)
Hemoglobin: 14.2 g/dL (ref 12.0–15.0)
Lymphocytes Relative: 5 %
Lymphs Abs: 0.5 10*3/uL — ABNORMAL LOW (ref 0.7–4.0)
MCH: 30.1 pg (ref 26.0–34.0)
MCHC: 32.6 g/dL (ref 30.0–36.0)
MCV: 92.4 fL (ref 78.0–100.0)
Monocytes Absolute: 0.2 10*3/uL (ref 0.1–1.0)
Monocytes Relative: 2 %
Neutro Abs: 9.3 10*3/uL — ABNORMAL HIGH (ref 1.7–7.7)
Neutrophils Relative %: 93 %
Platelets: 302 10*3/uL (ref 150–400)
RBC: 4.71 MIL/uL (ref 3.87–5.11)
RDW: 12.6 % (ref 11.5–15.5)
WBC: 10 10*3/uL (ref 4.0–10.5)

## 2016-08-23 LAB — LIPASE, BLOOD: Lipase: 19 U/L (ref 11–51)

## 2016-08-23 MED ORDER — IOPAMIDOL (ISOVUE-300) INJECTION 61%
INTRAVENOUS | Status: AC
  Administered 2016-08-23: 100 mL
  Filled 2016-08-23: qty 100

## 2016-08-23 MED ORDER — ONDANSETRON 4 MG PO TBDP
4.0000 mg | ORAL_TABLET | Freq: Once | ORAL | Status: AC
  Administered 2016-08-23: 4 mg via ORAL

## 2016-08-23 MED ORDER — ONDANSETRON 4 MG PO TBDP
ORAL_TABLET | ORAL | Status: AC
  Filled 2016-08-23: qty 1

## 2016-08-23 MED ORDER — ONDANSETRON HCL 4 MG/2ML IJ SOLN
4.0000 mg | Freq: Once | INTRAMUSCULAR | Status: AC
  Administered 2016-08-23: 4 mg via INTRAVENOUS
  Filled 2016-08-23: qty 2

## 2016-08-23 MED ORDER — SODIUM CHLORIDE 0.9 % IV BOLUS (SEPSIS)
500.0000 mL | Freq: Once | INTRAVENOUS | Status: AC
  Administered 2016-08-23: 500 mL via INTRAVENOUS

## 2016-08-23 MED ORDER — SODIUM CHLORIDE 0.9 % IV SOLN
INTRAVENOUS | Status: DC
  Administered 2016-08-23: 19:00:00 via INTRAVENOUS

## 2016-08-23 MED ORDER — PROMETHAZINE HCL 25 MG PO TABS: 25.0000 mg | ORAL_TABLET | Freq: Four times a day (QID) | ORAL | 0 refills | Status: DC | PRN

## 2016-08-23 NOTE — ED Notes (Signed)
Patient transported to CT 

## 2016-08-23 NOTE — ED Provider Notes (Signed)
[facility] DEPT Provider Note   CSN: [id number] Arrival date & time: 08/23/16  1358     History   Chief Complaint Chief Complaint  Patient presents with  . Emesis    HPI Katie Zimmerman is a 71 y.o. female.  She presents for evaluation of abdominal pain, vomiting, and diarrhea, for 2 days.  She denies fever although she has felt warm.  No known sick contacts, prior to illness.  There is a child living in her home, who began to be ill last night.  No known abnormal fluid contacts.  No history of similar problem.  She is recovering from a left wrist injury, in February.  She follows with her doctor regularly.  She had a colonoscopy in 2013, which showed diverticulosis.  No history of diverticulitis.  There are no other known modifying factors.  HPI  Past Medical History:  Diagnosis Date  . Breast cancer ([place]) 12/2011  . Breast cancer ([place]) 12/2011   DCIS  . Complication of anesthesia    hard to wake up age 63-not since  . Fibromyalgia   . GERD (gastroesophageal reflux disease)   . History of radiation therapy 02/29/12 -04/21/12   right breast, 5040 cGy in 28 fx, boost to total 6240 cGy  . Osteoporosis   . Personal history of colonic polyps-adenomas 05/25/2012  . PHN (postherpetic neuralgia) 2000   Right side of face    Patient Active Problem List   Diagnosis Date Noted  . BMI 33.0-33.9,adult 08/09/2016  . Increased endometrial stripe thickness 03/18/2016  . [place] arthritis 01/22/2015  . Counseling regarding end of life decision making 01/22/2015  . Osteoporosis 01/28/2014  . Hearing loss in right ear 01/20/2014  . Personal history of colonic polyps-adenomas 05/25/2012  . GERD (gastroesophageal reflux disease)   . History of radiation therapy   . Cancer of right breast, stage 0 01/26/2012  . Prediabetes 11/22/2011  . High cholesterol 11/22/2011  . TEMPOROMANDIBULAR JOINT PAIN 11/04/2009  . BRONCHITIS, CHRONIC 05/04/2009  . RENAL CALCULUS 05/04/2009  . Post herpetic  neuralgia 04/14/2009    Past Surgical History:  Procedure Laterality Date  . APPENDECTOMY    . BIOPSY BREAST  01/10/12   Right breast needle cor biopsy  . BREAST LUMPECTOMY  01/26/2012   Right/ERPR+ DCIS, right ax snbx  . FOOT SURGERY  1996   rt/lt great toes  . GLAUCOMA SURGERY  1993   laser  . OVARIAN CYST SURGERY  1964  . TUBAL LIGATION  1976    OB History    No data available       Home Medications    Prior to Admission medications   Medication Sig Start Date End Date Taking? Authorizing Provider  acetaminophen (TYLENOL) 500 MG tablet Take 1,000 mg by mouth every 4 (four) hours as needed for mild pain, moderate pain or headache.     Historical Provider, MD  albuterol (PROVENTIL HFA;VENTOLIN HFA) 108 (90 BASE) MCG/ACT inhaler Inhale 2 puffs into the lungs every 6 (six) hours as needed for wheezing. 01/22/15   Amy Cletis Athens, MD  cholecalciferol (VITAMIN D) 1000 units tablet Take 4,000 Units by mouth daily.    Historical Provider, MD  fluticasone furoate-vilanterol (BREO ELLIPTA) 100-25 MCG/INH AEPB Inhale 1 puff then rinse mouth, once daily 04/06/16   Deneise Lever, MD  gabapentin (NEURONTIN) 300 MG capsule TAKE TWO CAPSULES BY MOUTH IN THE MORNING AND TAKE FOUR CAPSULES at bedtime 08/05/16   Amy Cletis Athens, MD  HYDROcodone-acetaminophen (NORCO/VICODIN)  5-325 MG tablet Take 1-2 tablets by mouth every 6 (six) hours as needed. 07/23/16   Montine Circle, PA-C  promethazine (PHENERGAN) 25 MG tablet Take 1 tablet (25 mg total) by mouth every 8 (eight) hours as needed for nausea or vomiting. 03/15/16   Amy Cletis Athens, MD  tamoxifen (NOLVADEX) 20 MG tablet Take 1 tablet (20 mg total) by mouth daily. 08/01/16   Nicholas Lose, MD    Family History Family History  Problem Relation Age of Onset  . Hypertension Mother   . Cancer Mother     LIVER cancer  . Hypertension Father   . Diabetes Father   . Heart failure Father   . Breast cancer Sister 4    NOT hormone receptor breast cancer  .  Breast cancer Maternal Aunt 75  . Breast cancer Other     dx in her 21s  . Breast cancer Cousin 71    maternal cousin  . Breast cancer Cousin     maternal cousin  . Breast cancer Cousin     maternal cousin  . Cancer Cousin     unknown form of cancer; maternal cousin  . Cancer Cousin     unknown form of cancer; maternal cousin  . Cancer Cousin 11    ? lung cancer; maternal cousin  . Breast cancer Cousin     dx 31s-50s; paternal cousin  . Breast cancer Cousin     dx in her 23s; paternal cousin  . Colon cancer Neg Hx   . Stomach cancer Neg Hx     Social History Social History  Substance Use Topics  . Smoking status: Never Smoker  . Smokeless tobacco: Never Used  . Alcohol use Yes     Comment: occasional glass of wine     Allergies   Penicillins; Venlafaxine; and Sulfonamide derivatives   Review of Systems Review of Systems  All other systems reviewed and are negative.    Physical Exam Updated Vital Signs BP (!) 156/69   Pulse 91   Temp 98.9 F (37.2 C) (Oral)   Resp 18   SpO2 95%   Physical Exam  Constitutional: She is oriented to person, place, and time. She appears well-developed. No distress.  Elderly, frail  HENT:  Head: Normocephalic and atraumatic.  Eyes: Conjunctivae and EOM are normal. Pupils are equal, round, and reactive to light.  Neck: Normal range of motion and phonation normal. Neck supple.  Cardiovascular: Normal rate and regular rhythm.   Pulmonary/Chest: Effort normal and breath sounds normal. She exhibits no tenderness.  Abdominal: Soft. She exhibits no distension and no mass. There is tenderness (Right upper lower quadrants, mild). There is no rebound and no guarding. No hernia.  Musculoskeletal: Normal range of motion.  Neurological: She is alert and oriented to person, place, and time. She exhibits normal muscle tone.  Skin: Skin is warm and dry.  Psychiatric: She has a normal mood and affect. Her behavior is normal. Judgment and  thought content normal.  Nursing note and vitals reviewed.    ED Treatments / Results  Labs (all labs ordered are listed, but only abnormal results are displayed) Labs Reviewed  CBC WITH DIFFERENTIAL/PLATELET - Abnormal; Notable for the following:       Result Value   Neutro Abs 9.3 (*)    Lymphs Abs 0.5 (*)    All other components within normal limits  COMPREHENSIVE METABOLIC PANEL - Abnormal; Notable for the following:    Glucose, Bld 163 (*)  All other components within normal limits  LIPASE, BLOOD    EKG  EKG Interpretation None       Radiology Ct Abdomen Pelvis W Contrast  Result Date: 08/23/2016 CLINICAL DATA:  Left-sided abdominal pain with nausea and diarrhea beginning last night. Vomiting. EXAM: CT ABDOMEN AND PELVIS WITH CONTRAST TECHNIQUE: Multidetector CT imaging of the abdomen and pelvis was performed using the standard protocol following bolus administration of intravenous contrast. CONTRAST:  1 ISOVUE-300 IOPAMIDOL (ISOVUE-300) INJECTION 61% COMPARISON:  03/17/2016 pelvic ultrasound. 03/15/2016 abdominopelvic CT. FINDINGS: Lower chest: Clear lung bases. Mild cardiomegaly, without pericardial or pleural effusion. Tiny hiatal hernia. Hepatobiliary: Moderate hepatic steatosis, without focal liver lesion. Hepatomegaly at 19.1 cm craniocaudal. Mild motion degradation in the mid to lower abdomen. 1.9 cm stone within the gallbladder fundus. No pericholecystic inflammation or biliary duct dilatation. Pancreas: Normal, without mass or ductal dilatation. Spleen: Normal in size, without focal abnormality. Adrenals/Urinary Tract: Normal adrenal glands. Right renal cyst or minimally complex cyst at 1.9 cm. Moderate left renal cortical thinning/scarring. Lower pole left renal collecting system 9 mm stone again identified. Too small to characterize lower pole left renal lesion. No hydronephrosis. Normal urinary bladder. Stomach/Bowel: Normal remainder of the stomach. Normal colon.  Appendectomy. Normal small bowel. Vascular/Lymphatic: Advanced aortic and branch vessel atherosclerosis. No abdominopelvic adenopathy. Reproductive: Retroverted uterus. Re- demonstration of central uterine hypoattenuation, including at 13 mm on sagittal image 55. No adnexal mass. Other: No significant free fluid. Musculoskeletal: No acute osseous abnormality. IMPRESSION: 1. Mild motion degradation in the lower abdomen. 2.  No acute process in the abdomen or pelvis. 3. Cholelithiasis. 4. Left renal scarring/atrophy and nonobstructive lower pole stone. 5. Abnormal appearance of the uterus, which is again suspicious for endometrial carcinoma. Please see ultrasound report of 03/17/2016. Consider sampling or sonohysterogram. 6. Hepatic steatosis. 7.  Tiny hiatal hernia. Electronically Signed   By: Abigail Miyamoto M.D.   On: 08/23/2016 19:30    Procedures Procedures (including critical care time)  Medications Ordered in ED Medications  0.9 %  sodium chloride infusion ( Intravenous New Bag/Given 08/23/16 1832)  ondansetron (ZOFRAN-ODT) disintegrating tablet 4 mg (4 mg Oral Given 08/23/16 1436)  ondansetron (ZOFRAN) injection 4 mg (4 mg Intravenous Given 08/23/16 1832)  sodium chloride 0.9 % bolus 500 mL (500 mLs Intravenous New Bag/Given 08/23/16 1832)  iopamidol (ISOVUE-300) 61 % injection (100 mLs  Contrast Given 08/23/16 1856)     Initial Impression / Assessment and Plan / ED Course  I have reviewed the triage vital signs and the nursing notes.  Pertinent labs & imaging results that were available during my care of the patient were reviewed by me and considered in my medical decision making (see chart for details).     Medications  0.9 %  sodium chloride infusion ( Intravenous New Bag/Given 08/23/16 1832)  ondansetron (ZOFRAN-ODT) disintegrating tablet 4 mg (4 mg Oral Given 08/23/16 1436)  ondansetron (ZOFRAN) injection 4 mg (4 mg Intravenous Given 08/23/16 1832)  sodium chloride 0.9 % bolus 500 mL (500  mLs Intravenous New Bag/Given 08/23/16 1832)  iopamidol (ISOVUE-300) 61 % injection (100 mLs  Contrast Given 08/23/16 1856)    Patient Vitals for the past 24 hrs:  BP Temp Temp src Pulse Resp SpO2  08/23/16 1930 (!) 156/69 - - 91 - 95 %  08/23/16 1830 135/67 - - 90 - 99 %  08/23/16 1815 (!) 156/72 - - 94 - 99 %  08/23/16 1429 135/89 98.9 F (37.2 C) Oral 95 18 99 %  8:04 PM Reevaluation with update and discussion. After initial assessment and treatment, an updated evaluation reveals she is more comfortable, now.  Findings discussed with patient and her sister.  The patient is aware of her gallstones.  The patient is aware of the uterine abnormality, has had a uterine biopsy and plans on working with her gynecologist for further testing and follow-up. Marvelene Stoneberg L    Final Clinical Impressions(s) / ED Diagnoses   Final diagnoses:  Nausea vomiting and diarrhea    Nonspecific nausea vomiting and diarrhea with reassuring evaluation.  Doubt colitis, diverticulitis, serious bacterial infection, metabolic instability or impending vascular collapse.  Known abnormalities that are present on CT, discussed with the patient and her sister  Nursing Notes Reviewed/ Care Coordinated Applicable Imaging Reviewed Interpretation of Laboratory Data incorporated into ED treatment  The patient appears reasonably screened and/or stabilized for discharge and I doubt any other medical condition or other [facility] requiring further screening, evaluation, or treatment in the ED at this time prior to discharge.  Plan: Home Medications-continue usual medication; Home Treatments-rest, gradually advance diet; return here if the recommended treatment, does not improve the symptoms; Recommended follow up-PCP, as needed   Specific nausea vomiting diarrhea with reassuring imaging.  New Prescriptions New Prescriptions   No medications on file     Daleen Bo, MD 08/23/16 2011

## 2016-08-23 NOTE — ED Triage Notes (Signed)
Pt presents with onset of L sided abdominal pain, nausea, vomiting and diarrhea.  Pt drinking ginger ale but reports it comes back up.

## 2016-08-23 NOTE — Telephone Encounter (Signed)
Pt said she started last night with vomiting and diarrhea; pt just vomited and had diarrhea few mins ago. Pt mouth is very dry. Pt will get someone to take her to ED. May have dehydrated and pt is having pain in abd now. FYI to Dr Diona Browner.

## 2016-08-23 NOTE — ED Notes (Signed)
Pt stable, understands discharge instructions, and reasons for return.   

## 2016-08-23 NOTE — Telephone Encounter (Signed)
Pt is having sister take her to [facility]

## 2016-09-05 DIAGNOSIS — M1812 Unilateral primary osteoarthritis of first carpometacarpal joint, left hand: Secondary | ICD-10-CM | POA: Diagnosis not present

## 2016-09-22 ENCOUNTER — Other Ambulatory Visit: Payer: Self-pay | Admitting: Hematology and Oncology

## 2016-09-22 ENCOUNTER — Other Ambulatory Visit: Payer: Self-pay

## 2016-09-22 DIAGNOSIS — Z853 Personal history of malignant neoplasm of breast: Secondary | ICD-10-CM

## 2016-10-10 DIAGNOSIS — M1812 Unilateral primary osteoarthritis of first carpometacarpal joint, left hand: Secondary | ICD-10-CM | POA: Diagnosis not present

## 2016-10-27 ENCOUNTER — Ambulatory Visit
Admission: RE | Admit: 2016-10-27 | Discharge: 2016-10-27 | Disposition: A | Discharge status: Home / Self Care | Payer: Medicare Other | Source: Ambulatory Visit | Attending: Hematology and Oncology | Admitting: Hematology and Oncology

## 2016-10-27 ENCOUNTER — Ambulatory Visit
Admission: RE | Admit: 2016-10-27 | Discharge: 2016-10-27 | Disposition: A | Discharge status: Home / Self Care | Payer: Medicare Other | Source: Ambulatory Visit | Attending: Family Medicine | Admitting: Family Medicine

## 2016-10-27 DIAGNOSIS — M810 Age-related osteoporosis without current pathological fracture: Secondary | ICD-10-CM

## 2016-10-27 DIAGNOSIS — R928 Other abnormal and inconclusive findings on diagnostic imaging of breast: Secondary | ICD-10-CM | POA: Diagnosis not present

## 2016-10-27 DIAGNOSIS — Z853 Personal history of malignant neoplasm of breast: Secondary | ICD-10-CM

## 2016-10-27 DIAGNOSIS — M8589 Other specified disorders of bone density and structure, multiple sites: Secondary | ICD-10-CM | POA: Diagnosis not present

## 2016-10-27 HISTORY — DX: Personal history of irradiation: Z92.3

## 2016-11-04 DIAGNOSIS — H5711 Ocular pain, right eye: Secondary | ICD-10-CM | POA: Diagnosis not present

## 2016-11-14 ENCOUNTER — Other Ambulatory Visit: Payer: Self-pay | Admitting: Family Medicine

## 2016-11-14 ENCOUNTER — Other Ambulatory Visit: Payer: Self-pay

## 2016-11-14 MED ORDER — GABAPENTIN 300 MG PO CAPS: ORAL_CAPSULE | ORAL | 0 refills | Status: DC

## 2016-11-14 NOTE — Telephone Encounter (Signed)
Katie Zimmerman with [facility] left v/m requesting refill on gabapentin. Last refilled # 240 on 08/05/16; pt last seen 08/09/16.Please advise.

## 2016-12-24 ENCOUNTER — Encounter (HOSPITAL_COMMUNITY): Payer: Self-pay

## 2016-12-24 ENCOUNTER — Emergency Department (HOSPITAL_COMMUNITY)
Admission: EM | Admit: 2016-12-24 | Discharge: 2016-12-24 | Disposition: A | Discharge status: Home / Self Care | Payer: Medicare Other | Attending: Emergency Medicine | Admitting: Emergency Medicine

## 2016-12-24 ENCOUNTER — Emergency Department (HOSPITAL_COMMUNITY): Payer: Medicare Other

## 2016-12-24 DIAGNOSIS — Z79899 Other long term (current) drug therapy: Secondary | ICD-10-CM | POA: Diagnosis not present

## 2016-12-24 DIAGNOSIS — R05 Cough: Secondary | ICD-10-CM | POA: Insufficient documentation

## 2016-12-24 DIAGNOSIS — R062 Wheezing: Secondary | ICD-10-CM | POA: Diagnosis not present

## 2016-12-24 DIAGNOSIS — R112 Nausea with vomiting, unspecified: Secondary | ICD-10-CM | POA: Diagnosis not present

## 2016-12-24 DIAGNOSIS — E860 Dehydration: Secondary | ICD-10-CM | POA: Diagnosis not present

## 2016-12-24 DIAGNOSIS — R059 Cough, unspecified: Secondary | ICD-10-CM

## 2016-12-24 DIAGNOSIS — R197 Diarrhea, unspecified: Secondary | ICD-10-CM

## 2016-12-24 LAB — CBC WITH DIFFERENTIAL/PLATELET
Basophils Absolute: 0 10*3/uL (ref 0.0–0.1)
Basophils Relative: 1 %
Eosinophils Absolute: 0 10*3/uL (ref 0.0–0.7)
Eosinophils Relative: 0 %
HCT: 41.3 % (ref 36.0–46.0)
Hemoglobin: 13.7 g/dL (ref 12.0–15.0)
Lymphocytes Relative: 9 %
Lymphs Abs: 0.8 10*3/uL (ref 0.7–4.0)
MCH: 30 pg (ref 26.0–34.0)
MCHC: 33.2 g/dL (ref 30.0–36.0)
MCV: 90.6 fL (ref 78.0–100.0)
Monocytes Absolute: 0.7 10*3/uL (ref 0.1–1.0)
Monocytes Relative: 8 %
Neutro Abs: 7.2 10*3/uL (ref 1.7–7.7)
Neutrophils Relative %: 82 %
Platelets: 244 10*3/uL (ref 150–400)
RBC: 4.56 MIL/uL (ref 3.87–5.11)
RDW: 12.7 % (ref 11.5–15.5)
WBC: 8.8 10*3/uL (ref 4.0–10.5)

## 2016-12-24 LAB — BASIC METABOLIC PANEL
Anion gap: 12 (ref 5–15)
BUN: 8 mg/dL (ref 6–20)
CO2: 20 mmol/L — ABNORMAL LOW (ref 22–32)
Calcium: 8.9 mg/dL (ref 8.9–10.3)
Chloride: 101 mmol/L (ref 101–111)
Creatinine, Ser: 0.99 mg/dL (ref 0.44–1.00)
GFR calc Af Amer: >60 mL/min (ref >60)
GFR calc non Af Amer: 56 mL/min — ABNORMAL LOW (ref >60)
Glucose, Bld: 144 mg/dL — ABNORMAL HIGH (ref 65–99)
Potassium: 3.9 mmol/L (ref 3.5–5.1)
Sodium: 133 mmol/L — ABNORMAL LOW (ref 135–145)

## 2016-12-24 LAB — I-STAT CG4 LACTIC ACID, ED
Lactic Acid, Venous: 2.94 mmol/L (ref 0.5–1.9)
Lactic Acid, Venous: 1.64 mmol/L (ref 0.5–1.9)

## 2016-12-24 MED ORDER — DEXTROSE 5 % IV SOLN
1.0000 g | Freq: Once | INTRAVENOUS | Status: AC
  Administered 2016-12-24: 1 g via INTRAVENOUS
  Filled 2016-12-24: qty 10

## 2016-12-24 MED ORDER — ALBUTEROL SULFATE (2.5 MG/3ML) 0.083% IN NEBU
5.0000 mg | INHALATION_SOLUTION | Freq: Once | RESPIRATORY_TRACT | Status: AC
  Administered 2016-12-24: 5 mg via RESPIRATORY_TRACT
  Filled 2016-12-24: qty 6

## 2016-12-24 MED ORDER — AZITHROMYCIN 250 MG PO TABS: ORAL_TABLET | ORAL | 0 refills | Status: DC

## 2016-12-24 MED ORDER — DEXTROSE 5 % IV SOLN
500.0000 mg | Freq: Once | INTRAVENOUS | Status: AC
  Administered 2016-12-24: 500 mg via INTRAVENOUS
  Filled 2016-12-24: qty 500

## 2016-12-24 MED ORDER — GUAIFENESIN 100 MG/5ML PO SOLN
10.0000 mL | Freq: Once | ORAL | Status: AC
  Administered 2016-12-24: 200 mg via ORAL
  Filled 2016-12-24 (×2): qty 10

## 2016-12-24 MED ORDER — SODIUM CHLORIDE 0.9 % IV BOLUS (SEPSIS)
500.0000 mL | Freq: Once | INTRAVENOUS | Status: AC
  Administered 2016-12-24: 500 mL via INTRAVENOUS

## 2016-12-24 MED ORDER — SODIUM CHLORIDE 0.9 % IV BOLUS (SEPSIS)
1000.0000 mL | Freq: Once | INTRAVENOUS | Status: AC
  Administered 2016-12-24: 1000 mL via INTRAVENOUS

## 2016-12-24 MED ORDER — IPRATROPIUM BROMIDE 0.02 % IN SOLN
0.5000 mg | Freq: Once | RESPIRATORY_TRACT | Status: AC
  Administered 2016-12-24: 0.5 mg via RESPIRATORY_TRACT
  Filled 2016-12-24: qty 2.5

## 2016-12-24 MED ORDER — ALBUTEROL SULFATE HFA 108 (90 BASE) MCG/ACT IN AERS: 2.0000 | INHALATION_SPRAY | Freq: Four times a day (QID) | RESPIRATORY_TRACT | 0 refills | Status: DC | PRN

## 2016-12-24 MED ORDER — SODIUM CHLORIDE 0.9 % IV SOLN
INTRAVENOUS | Status: DC
  Administered 2016-12-24: 10 mL/h via INTRAVENOUS

## 2016-12-24 MED ORDER — ACETAMINOPHEN 500 MG PO TABS
1000.0000 mg | ORAL_TABLET | Freq: Once | ORAL | Status: AC
  Administered 2016-12-24: 1000 mg via ORAL
  Filled 2016-12-24: qty 2

## 2016-12-24 NOTE — Discharge Instructions (Addendum)
It was our pleasure to provide your ER care today - we hope that you feel better.  Rest. Drink adequate fluids.  Take antibiotic as prescribed.  Take robitussin or mucinex as need.   Use albuterol inhaler as need.   Follow up with your doctor in 1 week if symptoms fail to improve/resolve.  Return to ER if worse, trouble breathing, chest pain, other concern.

## 2016-12-24 NOTE — ED Notes (Signed)
This nurse encouraged pt to go home for the evening in order to get rest and recover. Pt insisted she be wheeled to husband's inpatient room on the 5th floor. Pt states "I wish you would have just kept me too." Explained to pt that based on her work up she is in good condition to go home and the prescriptions she was given will help along with rest and fluids. Pt verbalized understanding. Mask placed on pt to go upstairs due to cough. Pt given saltine crackers and gingerale.

## 2016-12-24 NOTE — ED Notes (Signed)
Pt given ginger ale and turkey sandwich 

## 2016-12-24 NOTE — ED Triage Notes (Signed)
Per Pt, Pt is coming from home with her husband who was on the ambulance and reports cough since Thursday along with N/V/D. Pt is diaphoretic and reports painful cough.

## 2016-12-24 NOTE — ED Provider Notes (Signed)
[facility] DEPT Provider Note   CSN: [id number] Arrival date & time: 12/24/16  1017     History   Chief Complaint Chief Complaint  Patient presents with  . Cough  . Diarrhea    HPI Katie Zimmerman is a 71 y.o. female.  Patient c/o non prod cough, and fever for the past 2 days. Symptoms episodic, persistent, worse this AM. Denies sore throat or runny nose. No sob. No chest pain. Hx recurrent bronchitis. Non smoker. Also w vomiting and diarrhea this AM, couple episodes. No blood. Denies abd pain.    The history is provided by the patient.  Cough  Pertinent negatives include no chest pain, no headaches, no sore throat, no shortness of breath and no eye redness.  Diarrhea   Associated symptoms include cough. Pertinent negatives include no abdominal pain and no headaches.    Past Medical History:  Diagnosis Date  . Breast cancer ([place]) 12/2011  . Breast cancer ([place]) 12/2011   DCIS  . Complication of anesthesia    hard to wake up age 28-not since  . Fibromyalgia   . GERD (gastroesophageal reflux disease)   . History of radiation therapy 02/29/12 -04/21/12   right breast, 5040 cGy in 28 fx, boost to total 6240 cGy  . Osteoporosis   . Personal history of colonic polyps-adenomas 05/25/2012  . Personal history of radiation therapy   . PHN (postherpetic neuralgia) 2000   Right side of face    Patient Active Problem List   Diagnosis Date Noted  . BMI 33.0-33.9,adult 08/09/2016  . Increased endometrial stripe thickness 03/18/2016  . [place] arthritis 01/22/2015  . Counseling regarding end of life decision making 01/22/2015  . Osteoporosis 01/28/2014  . Hearing loss in right ear 01/20/2014  . Personal history of colonic polyps-adenomas 05/25/2012  . GERD (gastroesophageal reflux disease)   . History of radiation therapy   . Cancer of right breast, stage 0 01/26/2012  . Prediabetes 11/22/2011  . High cholesterol 11/22/2011  . TEMPOROMANDIBULAR JOINT PAIN 11/04/2009  .  BRONCHITIS, CHRONIC 05/04/2009  . RENAL CALCULUS 05/04/2009  . Post herpetic neuralgia 04/14/2009    Past Surgical History:  Procedure Laterality Date  . APPENDECTOMY    . BIOPSY BREAST  01/10/12   Right breast needle cor biopsy  . BREAST BIOPSY  01/10/2012  . BREAST CYST ASPIRATION  01/10/2012  . BREAST LUMPECTOMY Right 01/26/2012   Right/ERPR+ DCIS, right ax snbx  . FOOT SURGERY  1996   rt/lt great toes  . GLAUCOMA SURGERY  1993   laser  . OVARIAN CYST SURGERY  1964  . TUBAL LIGATION  1976    OB History    No data available       Home Medications    Prior to Admission medications   Medication Sig Start Date End Date Taking? Authorizing Provider  acetaminophen (TYLENOL) 500 MG tablet Take 1,000 mg by mouth every 4 (four) hours as needed for mild pain, moderate pain or headache.     [provider]  albuterol (PROVENTIL HFA;VENTOLIN HFA) 108 (90 BASE) MCG/ACT inhaler Inhale 2 puffs into the lungs every 6 (six) hours as needed for wheezing. 01/22/15   Diona Browner, Amy E, MD  cholecalciferol (VITAMIN D) 1000 units tablet Take 4,000 Units by mouth daily.    [provider]  fluticasone furoate-vilanterol (BREO ELLIPTA) 100-25 MCG/INH AEPB Inhale 1 puff then rinse mouth, once daily 04/06/16   Baird Lyons D, MD  gabapentin (NEURONTIN) 300 MG capsule TAKE TWO  CAPSULES BY MOUTH IN THE MORNING AND TAKE FOUR CAPSULES at bedtime 11/14/16   Bedsole, Amy E, MD  HYDROcodone-acetaminophen (NORCO/VICODIN) 5-325 MG tablet Take 1-2 tablets by mouth every 6 (six) hours as needed. 07/23/16   Montine Circle, PA-C  promethazine (PHENERGAN) 25 MG tablet Take 1 tablet (25 mg total) by mouth every 6 (six) hours as needed for nausea or vomiting. 08/23/16   Daleen Bo, MD  tamoxifen (NOLVADEX) 20 MG tablet Take 1 tablet (20 mg total) by mouth daily. 08/01/16   Nicholas Lose, MD    Family History Family History  Problem Relation Age of Onset  . Hypertension Mother   . Cancer Mother          LIVER cancer  . Hypertension Father   . Diabetes Father   . Heart failure Father   . Breast cancer Sister 38       NOT hormone receptor breast cancer  . Breast cancer Maternal Aunt 75  . Breast cancer Other        dx in her 71s  . Breast cancer Cousin 36       maternal cousin  . Breast cancer Cousin        maternal cousin  . Breast cancer Cousin        maternal cousin  . Cancer Cousin        unknown form of cancer; maternal cousin  . Cancer Cousin        unknown form of cancer; maternal cousin  . Cancer Cousin 11       ? lung cancer; maternal cousin  . Breast cancer Cousin        dx 59s-50s; paternal cousin  . Breast cancer Cousin        dx in her 16s; paternal cousin  . Colon cancer Neg Hx   . Stomach cancer Neg Hx     Social History Social History  Substance Use Topics  . Smoking status: Never Smoker  . Smokeless tobacco: Never Used  . Alcohol use Yes     Comment: occasional glass of wine     Allergies   Penicillins; Venlafaxine; and Sulfonamide derivatives   Review of Systems Review of Systems  Constitutional: Positive for fever.  HENT: Negative for sore throat.   Eyes: Negative for redness.  Respiratory: Positive for cough. Negative for shortness of breath.   Cardiovascular: Negative for chest pain.  Gastrointestinal: Positive for diarrhea. Negative for abdominal pain.  Endocrine: Negative for polyuria.  Genitourinary: Negative for flank pain.  Musculoskeletal: Negative for back pain and neck pain.  Skin: Negative for rash.  Neurological: Negative for headaches.  Hematological: Does not bruise/bleed easily.  Psychiatric/Behavioral: Negative for confusion.     Physical Exam Updated Vital Signs BP (!) 152/80 (BP Location: Right Arm)   Pulse 96   Temp (!) 100.8 F (38.2 C) (Oral)   Resp 20   Ht 1.499 m (4\' 11" )   Wt 72.6 kg (160 lb)   SpO2 99%   BMI 32.32 kg/m   Physical Exam  Constitutional: She appears well-developed and  well-nourished. No distress.  HENT:  Mouth/Throat: Oropharynx is clear and moist.  Eyes: Conjunctivae are normal. No scleral icterus.  Neck: Neck supple. No tracheal deviation present.  Cardiovascular: Normal rate, regular rhythm, normal heart sounds and intact distal pulses.  Exam reveals no gallop and no friction rub.   No murmur heard. Pulmonary/Chest: Effort normal. No respiratory distress. She has wheezes.  Abdominal: Soft. Normal appearance and  bowel sounds are normal. She exhibits no distension. There is no tenderness.  Genitourinary:  Genitourinary Comments: No cva tenderness  Musculoskeletal: She exhibits no edema.  Neurological: She is alert.  Skin: Skin is warm and dry. No rash noted. She is not diaphoretic.  Psychiatric: She has a normal mood and affect.  Nursing note and vitals reviewed.    ED Treatments / Results  Labs (all labs ordered are listed, but only abnormal results are displayed) Results for orders placed or performed during the hospital encounter of 12/24/16  CBC with Differential/Platelet  Result Value Ref Range   WBC 8.8 4.0 - 10.5 K/uL   RBC 4.56 3.87 - 5.11 MIL/uL   Hemoglobin 13.7 12.0 - 15.0 g/dL   HCT 41.3 36.0 - 46.0 %   MCV 90.6 78.0 - 100.0 fL   MCH 30.0 26.0 - 34.0 pg   MCHC 33.2 30.0 - 36.0 g/dL   RDW 12.7 11.5 - 15.5 %   Platelets 244 150 - 400 K/uL   Neutrophils Relative % 82 %   Neutro Abs 7.2 1.7 - 7.7 K/uL   Lymphocytes Relative 9 %   Lymphs Abs 0.8 0.7 - 4.0 K/uL   Monocytes Relative 8 %   Monocytes Absolute 0.7 0.1 - 1.0 K/uL   Eosinophils Relative 0 %   Eosinophils Absolute 0.0 0.0 - 0.7 K/uL   Basophils Relative 1 %   Basophils Absolute 0.0 0.0 - 0.1 K/uL  Basic metabolic panel  Result Value Ref Range   Sodium 133 (L) 135 - 145 mmol/L   Potassium 3.9 3.5 - 5.1 mmol/L   Chloride 101 101 - 111 mmol/L   CO2 20 (L) 22 - 32 mmol/L   Glucose, Bld 144 (H) 65 - 99 mg/dL   BUN 8 6 - 20 mg/dL   Creatinine, Ser 0.99 0.44 - 1.00  mg/dL   Calcium 8.9 8.9 - 10.3 mg/dL   GFR calc non Af Amer 56 (L) >60 mL/min   GFR calc Af Amer >60 >60 mL/min   Anion gap 12 5 - 15  I-Stat CG4 Lactic Acid, ED  Result Value Ref Range   Lactic Acid, Venous 2.94 (HH) 0.5 - 1.9 mmol/L   Comment NOTIFIED PHYSICIAN    Dg Chest 2 View  Result Date: 12/24/2016 CLINICAL DATA:  No patient with cough. Nausea, vomiting and diarrhea. Diaphoresis. EXAM: CHEST  2 VIEW COMPARISON:  Chest radiograph 08/17/2015 FINDINGS: Patient is mildly rotated. Cardiac contours upper limits of normal. Tortuosity of the thoracic aorta. No consolidative pulmonary opacities. No pleural effusion or pneumothorax. Right chest wall postsurgical change. Thoracic spine degenerative changes. IMPRESSION: No acute cardiopulmonary process. Electronically Signed   By: Lovey Newcomer M.D.   On: 12/24/2016 11:14    EKG  EKG Interpretation None       Radiology No results found.  Procedures Procedures (including critical care time)  Medications Ordered in ED Medications  0.9 %  sodium chloride infusion (not administered)     Initial Impression / Assessment and Plan / ED Course  I have reviewed the triage vital signs and the nursing notes.  Pertinent labs & imaging results that were available during my care of the patient were reviewed by me and considered in my medical decision making (see chart for details).  Iv ns bolus.   Cxr. Albuterol neb.   Reviewed nursing notes and prior charts for additional history.   Patient given fluids in ed, tolerating po well.  Additional ivf.  Recheck, no  increased wob.  occ non prod cough.   Pt notes hx recurrent sev bronchitis and needs abx tx.   Given rhonchi/exam, will tx w abx.   Pt currently appears stable for d/c.   Return precautions provided.   Final Clinical Impressions(s) / ED Diagnoses   Final diagnoses:  None    New Prescriptions New Prescriptions   No medications on file     Lajean Saver,  MD 12/24/16 1416

## 2016-12-24 NOTE — ED Notes (Signed)
Pt able to drink half of gingerale and a few bites of a sandwich. Pt reports mild nausea but denies vomiting. Emesis bag empty at this time, no emesis noted.

## 2016-12-30 ENCOUNTER — Ambulatory Visit (INDEPENDENT_AMBULATORY_CARE_PROVIDER_SITE_OTHER): Payer: Medicare Other | Admitting: Family Medicine

## 2016-12-30 ENCOUNTER — Encounter: Payer: Self-pay | Admitting: Family Medicine

## 2016-12-30 DIAGNOSIS — J441 Chronic obstructive pulmonary disease with (acute) exacerbation: Secondary | ICD-10-CM

## 2016-12-30 DIAGNOSIS — J449 Chronic obstructive pulmonary disease, unspecified: Secondary | ICD-10-CM | POA: Insufficient documentation

## 2016-12-30 MED ORDER — METHYLPREDNISOLONE ACETATE 40 MG/ML IJ SUSP
40.0000 mg | Freq: Once | INTRAMUSCULAR | Status: AC
  Administered 2016-12-30: 40 mg via INTRAMUSCULAR

## 2016-12-30 MED ORDER — ALBUTEROL SULFATE (2.5 MG/3ML) 0.083% IN NEBU
5.0000 mg | INHALATION_SOLUTION | Freq: Once | RESPIRATORY_TRACT | Status: AC
  Administered 2016-12-30: 5 mg via RESPIRATORY_TRACT

## 2016-12-30 MED ORDER — BENZONATATE 200 MG PO CAPS: 200.0000 mg | ORAL_CAPSULE | Freq: Three times a day (TID) | ORAL | 0 refills | Status: DC | PRN

## 2016-12-30 MED ORDER — PREDNISONE 20 MG PO TABS: ORAL_TABLET | ORAL | 0 refills | Status: DC

## 2016-12-30 MED ORDER — PROMETHAZINE HCL 25 MG PO TABS: 25.0000 mg | ORAL_TABLET | Freq: Four times a day (QID) | ORAL | 0 refills | Status: DC | PRN

## 2016-12-30 NOTE — Assessment & Plan Note (Signed)
Treat with  DepoMedrol 40  X 1  Albuterol neb 5 mg given.  No clear indication for additional antibiotics at this time. Treat with albuterol , benzonatate, prednisone taper.   Close follow up early next week.

## 2016-12-30 NOTE — Patient Instructions (Signed)
Complete prednisone taper.  Use benzonatate for cough as needed.  Use albuterol as needed.  Rest, push fluids, return to regular diet.

## 2016-12-30 NOTE — Progress Notes (Signed)
   Subjective:    Patient ID: Katie Zimmerman, female    DOB: 12-02-1945, 71 y.o.   MRN: [id number]  HPI   71 year old female presents for ER follow up.   She was seen on  7/28 with cough and diarrhea. Was confused at the time.  Dx with  Recurrent bronchitis  Given IVF,  Nebs, ceftriaxone CBC: wnl  NA 133  glucose 144  Cr 0.99  CXR: no acute changes   Sent hone with azithromycin.  Completed antibiotic.  She has had some improvement in cough.. Very harsh cough, productive yellow sputum. No further fever. Mild shortness of breath, chest tigtness.  No further confusion. She continues to be weak and tired. She has been able to keep down liquids.  Diarrhea improving, one BM yesterday ( no emesis  but still some nausea)  Using mucinex D. Decreased appetite.  Lower chest sore form coughng  Using albuterol inh prn every 6 hours.  He husband fell, she has been trying to care for him  Blood pressure 134/84, pulse 87, temperature 98.3 F (36.8 C), temperature source Oral, height 4' 10.25" (1.48 m), weight 153 lb 8 oz (69.6 kg), SpO2 94 %.  Review of Systems  Constitutional: Positive for appetite change and fatigue. Negative for fever.  Respiratory: Positive for cough and shortness of breath. Negative for wheezing.   Cardiovascular: Negative for chest pain, palpitations and leg swelling.  Gastrointestinal: Positive for diarrhea. Negative for abdominal pain.       Objective:   Physical Exam  Constitutional: Vital signs are normal. She appears well-developed and well-nourished. She is cooperative.  Non-toxic appearance. She does not appear ill. No distress.  HENT:  Head: Normocephalic.  Right Ear: Hearing, tympanic membrane, external ear and ear canal normal. Tympanic membrane is not erythematous, not retracted and not bulging.  Left Ear: Hearing, tympanic membrane, external ear and ear canal normal. Tympanic membrane is not erythematous, not retracted and not bulging.  Nose: Mucosal  edema and rhinorrhea present. Right sinus exhibits no maxillary sinus tenderness and no frontal sinus tenderness. Left sinus exhibits no maxillary sinus tenderness and no frontal sinus tenderness.  Mouth/Throat: Uvula is midline, oropharynx is clear and moist and mucous membranes are normal.  Eyes: Pupils are equal, round, and reactive to light. Conjunctivae, EOM and lids are normal. Lids are everted and swept, no foreign bodies found.  Neck: Trachea normal and normal range of motion. Neck supple. Carotid bruit is not present. No thyroid mass and no thyromegaly present.  Cardiovascular: Normal rate, regular rhythm, S1 normal, S2 normal, normal heart sounds, intact distal pulses and normal pulses.  Exam reveals no gallop and no friction rub.   No murmur heard. Pulmonary/Chest: Effort normal. No tachypnea. No respiratory distress. She has no decreased breath sounds. She has wheezes in the right upper field, the right middle field, the right lower field, the left upper field, the left middle field and the left lower field. She has no rhonchi. She has no rales.  Air movement improved S/P albuterol inhaler  Neurological: She is alert.  Skin: Skin is warm, dry and intact. No rash noted.  Psychiatric: Her speech is normal and behavior is normal. Judgment normal. Her mood appears not anxious. Cognition and memory are normal. She does not exhibit a depressed mood.          Assessment & Plan:

## 2016-12-30 NOTE — Addendum Note (Signed)
Addended by: Magdalen Spatz C on: 12/30/2016 05:56 PM   Modules accepted: Orders

## 2017-01-03 ENCOUNTER — Encounter: Payer: Self-pay | Admitting: Family Medicine

## 2017-01-03 ENCOUNTER — Ambulatory Visit (INDEPENDENT_AMBULATORY_CARE_PROVIDER_SITE_OTHER): Payer: Medicare Other | Admitting: Family Medicine

## 2017-01-03 VITALS — BP 100/66 | HR 88 | Temp 98.4°F | Ht 58.25 in | Wt 154.2 lb

## 2017-01-03 DIAGNOSIS — J441 Chronic obstructive pulmonary disease with (acute) exacerbation: Secondary | ICD-10-CM

## 2017-01-03 NOTE — Patient Instructions (Signed)
Call if fever , cough worsening or shortness of breath returns.  Continue current medications.

## 2017-01-03 NOTE — Assessment & Plan Note (Signed)
Improving significantly after antibiotics, prednisone taper and cough suppressant.  Using albuterol prn.  Given still some wheeze on exam.. If symptoms return after pred taper completed.. Made need longer slower pred course extension.

## 2017-01-03 NOTE — Progress Notes (Addendum)
   Subjective:    Patient ID: Katie Zimmerman, female    DOB: 03-Dec-1945, 71 y.o.   MRN: [id number]  HPI  71 year old female pt presents for  3 day follow up COPD exacerbation. 3 days ago she was given depo IM 40 x 1  And started on prednisone.  She had completed azithromycin course.  She was instructed to use albuterol and benzonatate prn.   Today she reports significant improvement in energy and cough. Still with dry cough, only occ productive cough lighter yellow  Improvement in wheezing and shortness   Sleeping better at night.  No further fever.    no ear pain, no face pain.     Social History /Family History/Past Medical History reviewed in detail and updated in EMR if needed. Blood pressure 100/66, pulse 88, temperature 98.4 F (36.9 C), temperature source Oral, height 4' 10.25" (1.48 m), weight 154 lb 4 oz (70 kg), SpO2 93 %.  Review of Systems  Constitutional: Positive for fatigue. Negative for fever.  HENT: Negative for ear pain.   Eyes: Negative for pain.  Respiratory: Positive for cough. Negative for chest tightness and shortness of breath.   Cardiovascular: Negative for chest pain, palpitations and leg swelling.  Gastrointestinal: Negative for abdominal pain.  Genitourinary: Negative for dysuria.       Objective:   Physical Exam  Constitutional: Vital signs are normal. She appears well-developed and well-nourished. She is cooperative.  Non-toxic appearance. She does not appear ill. No distress.  HENT:  Head: Normocephalic.  Right Ear: Hearing, tympanic membrane, external ear and ear canal normal. Tympanic membrane is not erythematous, not retracted and not bulging.  Left Ear: Hearing, tympanic membrane, external ear and ear canal normal. Tympanic membrane is not erythematous, not retracted and not bulging.  Nose: Mucosal edema and rhinorrhea present. Right sinus exhibits no maxillary sinus tenderness and no frontal sinus tenderness. Left sinus exhibits no maxillary  sinus tenderness and no frontal sinus tenderness.  Mouth/Throat: Uvula is midline, oropharynx is clear and moist and mucous membranes are normal.  Eyes: Pupils are equal, round, and reactive to light. Conjunctivae, EOM and lids are normal. Lids are everted and swept, no foreign bodies found.  Neck: Trachea normal and normal range of motion. Neck supple. Carotid bruit is not present. No thyroid mass and no thyromegaly present.  Cardiovascular: Normal rate, regular rhythm, S1 normal, S2 normal, normal heart sounds, intact distal pulses and normal pulses.  Exam reveals no gallop and no friction rub.   No murmur heard. Pulmonary/Chest: Effort normal. No tachypnea. No respiratory distress. She has no decreased breath sounds. She has wheezes. She has no rales.  Scattered wheeze, occ rhonchi in right  Neurological: She is alert.  Skin: Skin is warm, dry and intact. No rash noted.  Psychiatric: Her speech is normal and behavior is normal. Judgment normal. Her mood appears not anxious. Cognition and memory are normal. She does not exhibit a depressed mood.          Assessment & Plan:

## 2017-01-05 ENCOUNTER — Telehealth: Payer: Self-pay

## 2017-01-05 MED ORDER — PREDNISONE 20 MG PO TABS: ORAL_TABLET | ORAL | 0 refills | Status: DC

## 2017-01-05 NOTE — Telephone Encounter (Signed)
Will prednisone course.  New rx for pt sent in.

## 2017-01-05 NOTE — Telephone Encounter (Signed)
Pt was seen on 01/03/17;today pt is feeling slightly better; pt still coughing; pt using inhaler due to hot temps and then has coughing; pt still has chest congestion and some wheezing..pt wonders if should get rx for slower prednisone course extension. Pt last dose of prednisone is 01/06/17. [facility] [place]. Pt request cb from Dr Diona Browner when finishes pts.

## 2017-02-07 ENCOUNTER — Ambulatory Visit (INDEPENDENT_AMBULATORY_CARE_PROVIDER_SITE_OTHER): Payer: Medicare Other

## 2017-02-07 ENCOUNTER — Other Ambulatory Visit: Payer: Self-pay

## 2017-02-07 ENCOUNTER — Telehealth: Payer: Self-pay | Admitting: Family Medicine

## 2017-02-07 VITALS — BP 108/70 | HR 68 | Temp 97.9°F | Ht 58.5 in | Wt 157.2 lb

## 2017-02-07 DIAGNOSIS — R7303 Prediabetes: Secondary | ICD-10-CM

## 2017-02-07 DIAGNOSIS — M818 Other osteoporosis without current pathological fracture: Secondary | ICD-10-CM | POA: Diagnosis not present

## 2017-02-07 DIAGNOSIS — R7309 Other abnormal glucose: Secondary | ICD-10-CM | POA: Diagnosis not present

## 2017-02-07 DIAGNOSIS — M810 Age-related osteoporosis without current pathological fracture: Secondary | ICD-10-CM

## 2017-02-07 DIAGNOSIS — E559 Vitamin D deficiency, unspecified: Secondary | ICD-10-CM | POA: Diagnosis not present

## 2017-02-07 DIAGNOSIS — Z0000 Encounter for general adult medical examination without abnormal findings: Secondary | ICD-10-CM

## 2017-02-07 DIAGNOSIS — E7800 Pure hypercholesterolemia, unspecified: Secondary | ICD-10-CM

## 2017-02-07 DIAGNOSIS — R944 Abnormal results of kidney function studies: Secondary | ICD-10-CM

## 2017-02-07 DIAGNOSIS — Z23 Encounter for immunization: Secondary | ICD-10-CM

## 2017-02-07 LAB — COMPREHENSIVE METABOLIC PANEL
ALT: 12 U/L (ref 0–35)
AST: 13 U/L (ref 0–37)
Albumin: 3.8 g/dL (ref 3.5–5.2)
Alkaline Phosphatase: 61 U/L (ref 39–117)
BUN: 9 mg/dL (ref 6–23)
CO2: 26 mEq/L (ref 19–32)
Calcium: 9.4 mg/dL (ref 8.4–10.5)
Chloride: 109 mEq/L (ref 96–112)
Creatinine, Ser: 0.79 mg/dL (ref 0.40–1.20)
GFR: 76.23 mL/min (ref >60.00)
Glucose, Bld: 116 mg/dL — ABNORMAL HIGH (ref 70–99)
Potassium: 4.8 mEq/L (ref 3.5–5.1)
Sodium: 142 mEq/L (ref 135–145)
Total Bilirubin: 0.3 mg/dL (ref 0.2–1.2)
Total Protein: 6.1 g/dL (ref 6.0–8.3)

## 2017-02-07 LAB — LIPID PANEL
Cholesterol: 166 mg/dL (ref 0–200)
HDL: 46.3 mg/dL (ref >39.00)
LDL Cholesterol: 87 mg/dL (ref 0–99)
NonHDL: 119.47
Total CHOL/HDL Ratio: 4
Triglycerides: 164 mg/dL — ABNORMAL HIGH (ref 0.0–149.0)
VLDL: 32.8 mg/dL (ref 0.0–40.0)

## 2017-02-07 LAB — HEMOGLOBIN A1C: Hgb A1c MFr Bld: 6.2 % (ref 4.6–6.5)

## 2017-02-07 LAB — LDL CHOLESTEROL, DIRECT: Direct LDL: 87 mg/dL

## 2017-02-07 LAB — VITAMIN D 25 HYDROXY (VIT D DEFICIENCY, FRACTURES): VITD: 40.75 ng/mL (ref 30.00–100.00)

## 2017-02-07 MED ORDER — ALBUTEROL SULFATE HFA 108 (90 BASE) MCG/ACT IN AERS: 2.0000 | INHALATION_SPRAY | Freq: Four times a day (QID) | RESPIRATORY_TRACT | 0 refills | Status: DC | PRN

## 2017-02-07 NOTE — Progress Notes (Signed)
Pre visit review using our clinic review tool, if applicable. No additional management support is needed unless otherwise documented below in the visit note. 

## 2017-02-07 NOTE — Telephone Encounter (Signed)
-----   Message from Eustace Pen, LPN sent at 01/29/7920  4:10 PM EDT ----- Regarding: Labs 9/11 Please place lab orders.   [employer]

## 2017-02-07 NOTE — Addendum Note (Signed)
Addended by: Carter Kitten on: 02/07/2017 10:24 AM   Modules accepted: Orders

## 2017-02-07 NOTE — Patient Instructions (Signed)
Katie Zimmerman , Thank you for taking time to come for your Medicare Wellness Visit. I appreciate your ongoing commitment to your health goals. Please review the following plan we discussed and let me know if I can assist you in the future.   These are the goals we discussed: Goals    . Increase physical activity          Starting 02/07/2017, I will continue to walk at least 30 min 4-5 days per week.        This is a list of the screening recommended for you and due dates:  Health Maintenance  Topic Date Due  . Colon Cancer Screening  05/16/2018*  . Tetanus Vaccine  07/01/2018*  . Mammogram  10/28/2018  . Flu Shot  Completed  . DEXA scan (bone density measurement)  Completed  .  Hepatitis C: One time screening is recommended by Center for Disease Control  (CDC) for  adults born from [birth year] through 1965.   Completed  . Pneumonia vaccines  Completed  *Topic was postponed. The date shown is not the original due date.   Preventive Care for Adults  A healthy lifestyle and preventive care can promote health and wellness. Preventive health guidelines for adults include the following key practices.  . A routine yearly physical is a good way to check with your health care provider about your health and preventive screening. It is a chance to share any concerns and updates on your health and to receive a thorough exam.  . Visit your dentist for a routine exam and preventive care every 6 months. Brush your teeth twice a day and floss once a day. Good oral hygiene prevents tooth decay and gum disease.  . The frequency of eye exams is based on your age, health, family medical history, use  of contact lenses, and other factors. Follow your health care provider's ecommendations for frequency of eye exams.  . Eat a healthy diet. Foods like vegetables, fruits, whole grains, low-fat dairy products, and lean protein foods contain the nutrients you need without too many calories. Decrease your intake of  foods high in solid fats, added sugars, and salt. Eat the right amount of calories for you. Get information about a proper diet from your health care provider, if necessary.  . Regular physical exercise is one of the most important things you can do for your health. Most adults should get at least 150 minutes of moderate-intensity exercise (any activity that increases your heart rate and causes you to sweat) each week. In addition, most adults need muscle-strengthening exercises on 2 or more days a week.  Silver Sneakers may be a benefit available to you. To determine eligibility, you may visit the website: www.silversneakers.com or contact program at (820) 692-0872 Mon-Fri between 8AM-8PM.   . Maintain a healthy weight. The body mass index (BMI) is a screening tool to identify possible weight problems. It provides an estimate of body fat based on height and weight. Your health care provider can find your BMI and can help you achieve or maintain a healthy weight.   For adults 20 years and older: ? A BMI below 18.5 is considered underweight. ? A BMI of 18.5 to 24.9 is normal. ? A BMI of 25 to 29.9 is considered overweight. ? A BMI of 30 and above is considered obese.   . Maintain normal blood lipids and cholesterol levels by exercising and minimizing your intake of saturated fat. Eat a balanced diet with plenty of fruit  and vegetables. Blood tests for lipids and cholesterol should begin at age 27 and be repeated every 5 years. If your lipid or cholesterol levels are high, you are over 50, or you are at high risk for heart disease, you may need your cholesterol levels checked more frequently. Ongoing high lipid and cholesterol levels should be treated with medicines if diet and exercise are not working.  . If you smoke, find out from your health care provider how to quit. If you do not use tobacco, please do not start.  . If you choose to drink alcohol, please do not consume more than 2 drinks per  day. One drink is considered to be 12 ounces (355 mL) of beer, 5 ounces (148 mL) of wine, or 1.5 ounces (44 mL) of liquor.  . If you are 40-69 years old, ask your health care provider if you should take aspirin to prevent strokes.  . Use sunscreen. Apply sunscreen liberally and repeatedly throughout the day. You should seek shade when your shadow is shorter than you. Protect yourself by wearing long sleeves, pants, a wide-brimmed hat, and sunglasses year round, whenever you are outdoors.  . Once a month, do a whole body skin exam, using a mirror to look at the skin on your back. Tell your health care provider of new moles, moles that have irregular borders, moles that are larger than a pencil eraser, or moles that have changed in shape or color.

## 2017-02-07 NOTE — Telephone Encounter (Signed)
Patient has requested refill for albuterol inhaler. Please refill to [facility].

## 2017-02-07 NOTE — Progress Notes (Signed)
I reviewed health advisor's note, was available for consultation, and agree with documentation and plan.  

## 2017-02-07 NOTE — Progress Notes (Signed)
PCP notes:   Health maintenance:  Flu vaccine - administered  Abnormal screenings:   Hearing - failed  Patient concerns:   None  Nurse concerns:  None  Next PCP appt:   02/21/17 @ 1400

## 2017-02-07 NOTE — Progress Notes (Signed)
Subjective:   Katie Zimmerman is a 71 y.o. female who presents for Medicare Annual (Subsequent) preventive examination.  Review of Systems:  N/A Cardiac Risk Factors include: advanced age (>52men, >5 women);obesity (BMI >30kg/m2);dyslipidemia     Objective:     Vitals: BP 108/70 (BP Location: Right Arm, Patient Position: Sitting, Cuff Size: Normal)   Pulse 68   Temp 97.9 F (36.6 C) (Oral)   Ht 4' 10.5" (1.486 m) Comment: no shoes  Wt 157 lb 4 oz (71.3 kg)   SpO2 99%   BMI 32.31 kg/m   Body mass index is 32.31 kg/m.   Tobacco History  Smoking Status  . Never Smoker  Smokeless Tobacco  . Never Used     Counseling given: No   Past Medical History:  Diagnosis Date  . Breast cancer ([place]) 12/2011  . Breast cancer ([place]) 12/2011   DCIS  . Complication of anesthesia    hard to wake up age 65-not since  . Fibromyalgia   . GERD (gastroesophageal reflux disease)   . History of radiation therapy 02/29/12 -04/21/12   right breast, 5040 cGy in 28 fx, boost to total 6240 cGy  . Osteoporosis   . Personal history of colonic polyps-adenomas 05/25/2012  . Personal history of radiation therapy   . PHN (postherpetic neuralgia) 2000   Right side of face   Past Surgical History:  Procedure Laterality Date  . APPENDECTOMY    . BIOPSY BREAST  01/10/12   Right breast needle cor biopsy  . BREAST BIOPSY  01/10/2012  . BREAST CYST ASPIRATION  01/10/2012  . BREAST LUMPECTOMY Right 01/26/2012   Right/ERPR+ DCIS, right ax snbx  . FOOT SURGERY  1996   rt/lt great toes  . GLAUCOMA SURGERY  1993   laser  . OVARIAN CYST SURGERY  1964  . TUBAL LIGATION  1976   Family History  Problem Relation Age of Onset  . Hypertension Mother   . Cancer Mother        LIVER cancer  . Hypertension Father   . Diabetes Father   . Heart failure Father   . Breast cancer Sister 86       NOT hormone receptor breast cancer  . Breast cancer Maternal Aunt 75  . Breast cancer Other        dx in her 28s  .  Breast cancer Cousin 49       maternal cousin  . Breast cancer Cousin        maternal cousin  . Breast cancer Cousin        maternal cousin  . Cancer Cousin        unknown form of cancer; maternal cousin  . Cancer Cousin        unknown form of cancer; maternal cousin  . Cancer Cousin 11       ? lung cancer; maternal cousin  . Breast cancer Cousin        dx 6s-50s; paternal cousin  . Breast cancer Cousin        dx in her 51s; paternal cousin  . Colon cancer Neg Hx   . Stomach cancer Neg Hx    History  Sexual Activity  . Sexual activity: Not Currently    Outpatient Encounter Prescriptions as of 02/07/2017  Medication Sig  . acetaminophen (TYLENOL) 500 MG tablet Take 1,000 mg by mouth every 4 (four) hours as needed for mild pain, moderate pain or headache.   . cholecalciferol (VITAMIN D)  1000 units tablet Take 4,000 Units by mouth daily.  . fluticasone furoate-vilanterol (BREO ELLIPTA) 100-25 MCG/INH AEPB Inhale 1 puff then rinse mouth, once daily  . promethazine (PHENERGAN) 25 MG tablet Take 1 tablet (25 mg total) by mouth every 6 (six) hours as needed for nausea or vomiting.  . tamoxifen (NOLVADEX) 20 MG tablet Take 1 tablet (20 mg total) by mouth daily.  . [DISCONTINUED] albuterol (PROVENTIL HFA;VENTOLIN HFA) 108 (90 Base) MCG/ACT inhaler Inhale 2 puffs into the lungs every 6 (six) hours as needed for wheezing or shortness of breath.  . gabapentin (NEURONTIN) 300 MG capsule TAKE TWO CAPSULES BY MOUTH IN THE MORNING AND TAKE FOUR CAPSULES at bedtime (Patient not taking: Reported on 12/24/2016)  . [DISCONTINUED] benzonatate (TESSALON) 200 MG capsule Take 1 capsule (200 mg total) by mouth 3 (three) times daily as needed for cough.  . [DISCONTINUED] HYDROcodone-acetaminophen (NORCO/VICODIN) 5-325 MG tablet Take 1-2 tablets by mouth every 6 (six) hours as needed. (Patient not taking: Reported on 12/24/2016)  . [DISCONTINUED] predniSONE (DELTASONE) 20 MG tablet 3 tabs po daily x 5 days,  then 2 tabs daily x 3 days then 1 tab daily x 3 days , then 1/2 tab daily x 2 days   No facility-administered encounter medications on file as of 02/07/2017.     Activities of Daily Living In your present state of health, do you have any difficulty performing the following activities: 02/07/2017  Hearing? Y  Comment partial hearing loss in right ear  Vision? Y  Comment cataracts in both eyes  Difficulty concentrating or making decisions? N  Walking or climbing stairs? N  Dressing or bathing? N  Doing errands, shopping? N  Preparing Food and eating ? N  Using the Toilet? N  In the past six months, have you accidently leaked urine? Y  Do you have problems with loss of bowel control? N  Managing your Medications? N  Managing your Finances? N  Housekeeping or managing your Housekeeping? N  Some recent data might be hidden    Patient Care Team: [name], MD as PCP - General    Assessment:     Hearing Screening   125Hz  250Hz  500Hz  1000Hz  2000Hz  3000Hz  4000Hz  6000Hz  8000Hz   Right ear:   0 0 0  0    Left ear:   40 40 40  40    Vision Screening Comments: Last vision exam in Oct 2017 with Dr. Satira Sark   Exercise Activities and Dietary recommendations Current Exercise Habits: Home exercise routine, Type of exercise: walking, Time (Minutes): 30, Frequency (Times/Week): 5, Weekly Exercise (Minutes/Week): 150, Intensity: Mild, Exercise limited by: None identified  Goals    . Increase physical activity          Starting 02/07/2017, I will continue to walk at least 30 min 4-5 days per week.       Fall Risk Fall Risk  02/07/2017 02/04/2016 01/22/2015 02/20/2014 01/20/2014  Falls in the past year? No No No No No   Depression Screen PHQ 2/9 Scores 02/07/2017 02/04/2016 01/22/2015 01/20/2014  PHQ - 2 Score 0 0 0 0  PHQ- 9 Score 0 - - -     Cognitive Function MMSE - Mini Mental State Exam 02/07/2017  Orientation to time 5  Orientation to Place 5  Registration 3  Attention/ Calculation 0   Recall 3  Language- name 2 objects 0  Language- repeat 1  Language- follow 3 step command 3  Language- read & follow direction 0  Write a sentence 0  Copy design 0  Total score 20     PLEASE NOTE: A Mini-Cog screen was completed. Maximum score is 20. A value of 0 denotes this part of Folstein MMSE was not completed or the patient failed this part of the Mini-Cog screening.   Mini-Cog Screening Orientation to Time - Max 5 pts Orientation to Place - Max 5 pts Registration - Max 3 pts Recall - Max 3 pts Language Repeat - Max 1 pts Language Follow 3 Step Command - Max 3 pts     Immunization History  Administered Date(s) Administered  . Influenza Split 05/29/2012  . Influenza Whole 03/07/2005  . Influenza,inj,Quad PF,6+ Mos 01/20/2014, 01/22/2015, 02/04/2016, 02/07/2017  . Pneumococcal Conjugate-13 01/20/2014  . Pneumococcal Polysaccharide-23 10/21/2011  . Td 07/02/1998   Screening Tests Health Maintenance  Topic Date Due  . COLONOSCOPY  05/16/2018 (Originally 05/18/2015)  . TETANUS/TDAP  07/01/2018 (Originally 07/02/2008)  . MAMMOGRAM  10/28/2018  . INFLUENZA VACCINE  Completed  . DEXA SCAN  Completed  . Hepatitis C Screening  Completed  . PNA vac Low Risk Adult  Completed      Plan:     I have personally reviewed and addressed the Medicare Annual Wellness questionnaire and have noted the following in the patient's chart:  A. Medical and social history B. Use of alcohol, tobacco or illicit drugs  C. Current medications and supplements D. Functional ability and status E.  Nutritional status F.  Physical activity G. Advance directives H. List of other physicians I.  Hospitalizations, surgeries, and ER visits in previous 12 months J.  [place] to include hearing, vision, cognitive, depression L. Referrals and appointments - none  In addition, I have reviewed and discussed with patient certain preventive protocols, quality metrics, and best practice  recommendations. A written personalized care plan for preventive services as well as general preventive health recommendations were provided to patient.  See attached scanned questionnaire for additional information.   Signed,   Lindell Noe, MHA, BS, LPN Health Coach

## 2017-02-14 ENCOUNTER — Encounter: Payer: Medicare Other | Admitting: Family Medicine

## 2017-02-16 DIAGNOSIS — D0510 Intraductal carcinoma in situ of unspecified breast: Secondary | ICD-10-CM | POA: Diagnosis not present

## 2017-02-21 ENCOUNTER — Ambulatory Visit (INDEPENDENT_AMBULATORY_CARE_PROVIDER_SITE_OTHER): Payer: Medicare Other | Admitting: Family Medicine

## 2017-02-21 ENCOUNTER — Encounter: Payer: Self-pay | Admitting: Family Medicine

## 2017-02-21 VITALS — BP 120/80 | HR 82 | Temp 98.8°F | Ht 58.5 in | Wt 159.5 lb

## 2017-02-21 DIAGNOSIS — J449 Chronic obstructive pulmonary disease, unspecified: Secondary | ICD-10-CM

## 2017-02-21 DIAGNOSIS — R519 Headache, unspecified: Secondary | ICD-10-CM | POA: Insufficient documentation

## 2017-02-21 DIAGNOSIS — R51 Headache: Secondary | ICD-10-CM | POA: Diagnosis not present

## 2017-02-21 DIAGNOSIS — R7303 Prediabetes: Secondary | ICD-10-CM

## 2017-02-21 DIAGNOSIS — Z0000 Encounter for general adult medical examination without abnormal findings: Secondary | ICD-10-CM | POA: Diagnosis not present

## 2017-02-21 DIAGNOSIS — E7800 Pure hypercholesterolemia, unspecified: Secondary | ICD-10-CM

## 2017-02-21 DIAGNOSIS — B0229 Other postherpetic nervous system involvement: Secondary | ICD-10-CM

## 2017-02-21 HISTORY — DX: Headache, unspecified: R51.9

## 2017-02-21 MED ORDER — FLUTICASONE FUROATE-VILANTEROL 100-25 MCG/INH IN AEPB: INHALATION_SPRAY | RESPIRATORY_TRACT | 12 refills | Status: DC

## 2017-02-21 NOTE — Patient Instructions (Addendum)
Look into shingles and tetanus vaccine coverage.  Restart gabapentin.  Keep headache diary. If continued headaches.. Make follow up appt.   Make appt if abdominal pain returns.

## 2017-02-21 NOTE — Assessment & Plan Note (Signed)
Stable control. Encouraged exercise, weight loss, healthy eating habits.  

## 2017-02-21 NOTE — Assessment & Plan Note (Signed)
LDL at goal on no med. Encouraged exercise, weight loss, healthy eating habits.

## 2017-02-21 NOTE — Progress Notes (Signed)
Subjective:    Patient ID: Katie Zimmerman, female    DOB: 1945-12-12, 71 y.o.   MRN: [id number]  HPI   The patient presents for complete physical and review of chronic health problems. He/She also has the following acute concerns today: headaches, noted ever since sick  In 12/2016  The patient saw Candis Musa, LPN for medicare wellness. Note reviewed in detail and important notes copied below. Health maintenance: Flu vaccine - administered Abnormal screenings:  Hearing - failed  02/21/17  Headaches ongoing x 1 months. Every 2 weeks, over whole head No improvement with tylenol. Noted since was sick with COPD exacerbations. Occ nauseous. She used her nieces fioricet.Marland Kitchen Helps.  No med changes except she is off gabapentin for PHN pain.. But this pain is in right face. No new vision changes, no new neuro changes.   COPD: Resolved COPD exacerbation, only occ congestion.  Followed by Dr. Annamaria Boots. Has not bee using Breo ellipta.. Ran out. Nebulizer bronchodilator prn.  Elevated Cholesterol:  Lab Results  Component Value Date   CHOL 166 02/07/2017   HDL 46.30 02/07/2017   LDLCALC 87 02/07/2017   LDLDIRECT 87.0 02/07/2017   TRIG 164.0 (H) 02/07/2017   CHOLHDL 4 02/07/2017   Uing medications without problems: Muscle aches:  Diet compliance: Exercise: Other complaints:  Prediabetes  Stable. Lab Results  Component Value Date   HGBA1C 6.2 02/07/2017   HX OF BREAST CANCER Hx of DCIS of the right breast status post lumpectomy.  Now tamoxifen x on yr 4/5  Dr. Sondra Come radiation onc.  Dr. Lindi Adie is oncologist. Reviewed last [place] . Dr. Emmie Niemann in surgeon.   Post herpetic neuralgia:   no issue at moment off gabapentin.  Social History /Family History/Past Medical History reviewed in detail and updated in EMR if needed. Blood pressure 120/80, pulse 82, temperature 98.8 F (37.1 C), temperature source Oral, height 4' 10.5" (1.486 m), weight 159 lb 8 oz (72.3 kg). Review of  Systems  Gastrointestinal: Positive for abdominal pain.  Neurological: Positive for headaches.       Objective:   Physical Exam  Constitutional: She is oriented to person, place, and time. Vital signs are normal. She appears well-developed and well-nourished. She is cooperative.  Non-toxic appearance. She does not appear ill. No distress.  HENT:  Head: Normocephalic.  Right Ear: Hearing, tympanic membrane, external ear and ear canal normal. Tympanic membrane is not erythematous, not retracted and not bulging.  Left Ear: Hearing, tympanic membrane, external ear and ear canal normal. Tympanic membrane is not erythematous, not retracted and not bulging.  Nose: No mucosal edema or rhinorrhea. Right sinus exhibits no maxillary sinus tenderness and no frontal sinus tenderness. Left sinus exhibits no maxillary sinus tenderness and no frontal sinus tenderness.  Mouth/Throat: Uvula is midline, oropharynx is clear and moist and mucous membranes are normal.  Eyes: Pupils are equal, round, and reactive to light. Conjunctivae, EOM and lids are normal. Lids are everted and swept, no foreign bodies found.  Neck: Trachea normal and normal range of motion. Neck supple. Carotid bruit is not present. No thyroid mass and no thyromegaly present.  Cardiovascular: Normal rate, regular rhythm, S1 normal, S2 normal, normal heart sounds, intact distal pulses and normal pulses.  Exam reveals no gallop and no friction rub.   No murmur heard. Pulmonary/Chest: Effort normal and breath sounds normal. No tachypnea. No respiratory distress. She has no decreased breath sounds. She has no wheezes. She has no rhonchi. She has no rales.  Abdominal: Soft. Normal appearance and bowel sounds are normal. There is no tenderness.  Neurological: She is alert and oriented to person, place, and time. She has normal strength and normal reflexes. No cranial nerve deficit or sensory deficit. She exhibits normal muscle tone. She displays a  negative Romberg sign. Coordination and gait normal. GCS eye subscore is 4. GCS verbal subscore is 5. GCS motor subscore is 6.  Nml cerebellar exam   No papilledema  Skin: Skin is warm, dry and intact. No rash noted.  Psychiatric: She has a normal mood and affect. Her speech is normal and behavior is normal. Judgment and thought content normal. Her mood appears not anxious. Cognition and memory are normal. Cognition and memory are not impaired. She does not exhibit a depressed mood. She exhibits normal recent memory and normal remote memory.          Assessment & Plan:  The patient's preventative maintenance and recommended screening tests for an annual wellness exam were reviewed in full today. Brought up to date unless services declined.  Counselled on the importance of diet, exercise, and its role in overall health and mortality. The patient's FH and SH was reviewed, including their home life, tobacco status, and drug and alcohol status.   Vaccines: Uptodate with pneumovax and prevnar, consider TDAP, shingles coverage. Given flu at [facility] 04/2012 Dr. Carlean Purl, colonoscopy 5 adenomas max 6 mm Repeat colon 04/2017  DVE/PAP: plans to see GYN. Mammo: Breast cancer followed by Dr. Lindi Adie, last mammo 09/2016 DEXA:09/2016; osteopenia, on tamoxifen ( almost done 5 years), fosamax not needed. Repeat in 2 years. Due now. Non smoker, sig second hand some. Hearing loss in right ear. Dr. Warren Danes.  Hep C: neg

## 2017-02-21 NOTE — Assessment & Plan Note (Signed)
No clear cause but started when off gabapentin.  Keep headache diary.. ? Migraine variant.

## 2017-02-21 NOTE — Assessment & Plan Note (Signed)
Restart gabapentin as PHN returning.

## 2017-02-21 NOTE — Assessment & Plan Note (Signed)
Pt to restart Breo given recent increase in exacerbations.

## 2017-03-02 ENCOUNTER — Encounter: Payer: Self-pay | Admitting: Physical Therapy

## 2017-03-02 ENCOUNTER — Ambulatory Visit: Payer: Medicare Other | Attending: General Surgery | Admitting: Physical Therapy

## 2017-03-02 DIAGNOSIS — M25611 Stiffness of right shoulder, not elsewhere classified: Secondary | ICD-10-CM | POA: Insufficient documentation

## 2017-03-02 DIAGNOSIS — M25612 Stiffness of left shoulder, not elsewhere classified: Secondary | ICD-10-CM | POA: Insufficient documentation

## 2017-03-02 DIAGNOSIS — L599 Disorder of the skin and subcutaneous tissue related to radiation, unspecified: Secondary | ICD-10-CM | POA: Diagnosis not present

## 2017-03-02 DIAGNOSIS — R293 Abnormal posture: Secondary | ICD-10-CM | POA: Diagnosis not present

## 2017-03-02 DIAGNOSIS — M6281 Muscle weakness (generalized): Secondary | ICD-10-CM | POA: Insufficient documentation

## 2017-03-02 NOTE — Therapy (Signed)
[place], [place], [address] Phone: [phone]   Fax:  [phone]  Physical Therapy Evaluation  Patient Details  Name: Katie Zimmerman MRN: [id number] Date of Birth: 1946/04/23 Referring Provider: Dr. Donne Hazel  Encounter Date: 03/02/2017      PT End of Session - 03/02/17 1656    Visit Number 1   Number of Visits 9   Date for PT Re-Evaluation 03/30/17   PT Start Time 6237   PT Stop Time 1645   PT Time Calculation (min) 38 min   Activity Tolerance Patient tolerated treatment well   Behavior During Therapy [facility] for tasks assessed/performed      Past Medical History:  Diagnosis Date  . Breast cancer ([place]) 12/2011  . Breast cancer ([place]) 12/2011   DCIS  . Complication of anesthesia    hard to wake up age 19-not since  . Fibromyalgia   . GERD (gastroesophageal reflux disease)   . History of radiation therapy 02/29/12 -04/21/12   right breast, 5040 cGy in 28 fx, boost to total 6240 cGy  . Osteoporosis   . Personal history of colonic polyps-adenomas 05/25/2012  . Personal history of radiation therapy   . PHN (postherpetic neuralgia) 2000   Right side of face    Past Surgical History:  Procedure Laterality Date  . APPENDECTOMY    . BIOPSY BREAST  01/10/12   Right breast needle cor biopsy  . BREAST BIOPSY  01/10/2012  . BREAST CYST ASPIRATION  01/10/2012  . BREAST LUMPECTOMY Right 01/26/2012   Right/ERPR+ DCIS, right ax snbx  . FOOT SURGERY  1996   rt/lt great toes  . GLAUCOMA SURGERY  1993   laser  . OVARIAN CYST SURGERY  1964  . TUBAL LIGATION  1976    There were no vitals filed for this visit.       Subjective Assessment - 03/02/17 1611    Subjective The tightness has been there since my surgery and for a long time I did not have any feeling in that area. Sometimes when I reach back or reach in a certain direction it will catch under my armpit.    Pertinent History DCIS R lumpectomy and  SLNB in August 2013 followed by radiation and currently taking Tamoxifen   Patient Stated Goals to get to where I don't have that catch in my armpit   Currently in Pain? No/denies   Multiple Pain Sites No            [facility] PT Assessment - 03/02/17 0001      Assessment   Medical Diagnosis right breast DCIS   Referring Provider Dr. Donne Hazel   Onset Date/Surgical Date 12/29/11   Hand Dominance Right   Prior Therapy none     Precautions   Precautions Other (comment)  at risk for lymphedema     Restrictions   Weight Bearing Restrictions No     Balance Screen   Has the patient fallen in the past 6 months Yes   How many times? 1  passed out in kitchen when sick   Has the patient had a decrease in activity level because of a fear of falling?  No   Is the patient reluctant to leave their home because of a fear of falling?  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   Leisure pt states she walks everyday for 30 min     Cognition   Overall Cognitive Status Within Functional Limits for tasks assessed     Observation/Other Assessments   Observations fibrosis under right breast in area of lumpectomy scar and in right axilla     ROM / Strength   AROM / PROM / Strength AROM     AROM   AROM Assessment Site Shoulder   Right/Left Shoulder Right;Left   Right Shoulder Flexion 149 Degrees   Right Shoulder ABduction 132 Degrees   Right Shoulder Internal Rotation 48 Degrees   Right Shoulder External Rotation 67 Degrees   Left Shoulder Flexion 157 Degrees   Left Shoulder ABduction 131 Degrees   Left Shoulder Internal  Rotation 75 Degrees   Left Shoulder External Rotation 171 Degrees           LYMPHEDEMA/ONCOLOGY QUESTIONNAIRE - 03/02/17 1629      Type   Cancer Type right breast DCIS     Surgeries   Lumpectomy Date 12/29/11   Sentinel Lymph Node Biopsy Date 12/29/11   Number Lymph Nodes Removed 2     Treatment   Active Chemotherapy Treatment No   Past Chemotherapy Treatment No   Active Radiation Treatment No   Past Radiation Treatment Yes   Current Hormone Treatment Yes   Drug Name Tamoxifen   Past Hormone Therapy No     What other symptoms do you have   Are you Having Heaviness or Tightness Yes   Are you having Pain Yes  when she reaches a certain way   Are you having pitting edema No   Is it Hard or Difficult finding clothes that fit No   Do you have infections No   Is there Decreased scar mobility Yes  in area of lumpectomy scar     Lymphedema Assessments   Lymphedema Assessments Upper extremities     Right Upper Extremity Lymphedema   15 cm Proximal to Olecranon Process 34.2 cm   Olecranon Process 26.8 cm   15 cm Proximal to Ulnar Styloid Process 25.9 cm   Just Proximal to Ulnar Styloid Process 16.5 cm   Across Hand at [employer] 17.8 cm   At [place] of 2nd Digit 6 cm     Left Upper Extremity Lymphedema   15 cm Proximal to Olecranon Process 33 cm   Olecranon Process 27 cm   15 cm Proximal to Ulnar Styloid Process 25.2 cm   Just Proximal to Ulnar Styloid Process 17 cm   Across Hand at [employer] 17.5 cm   At [place] of 2nd Digit 6 cm           Quick Dash - 03/02/17 0001    Open a tight or new jar Mild difficulty   Do heavy household chores (wash walls, wash floors) Mild difficulty   Carry a shopping bag or briefcase No difficulty   Wash your back Moderate difficulty   Use a knife to cut food No difficulty   Recreational activities in which you take some force or impact through your arm, shoulder, or hand (golf, hammering, tennis) Severe difficulty   During  the past week, to what extent has  your arm, shoulder or hand problem interfered with your normal social activities with family, friends, neighbors, or groups? Not at all   During the past week, to what extent has your arm, shoulder or hand problem limited your work or other regular daily activities [place]   Arm, shoulder, or hand pain. Severe   Tingling (pins and needles) in your arm, shoulder, or hand None   Difficulty Sleeping No difficulty   DASH Score 27.27 %      Objective measurements completed on examination: See above findings.                  PT Education - 03/02/17 1708    Education provided Yes   Education Details anatomy and physiology of lymphatic system and lymphedema risk reduction practices   Person(s) Educated Patient   Methods Explanation;Handout   Comprehension Verbalized understanding                Long Term Clinic Goals - 03/02/17 1704      CC Long Term Goal  #1   Title Pt will demonstrate 165 degrees of bilateral shoulder flexion to allow pt to reach upwards without axillary stretching on R   Baseline R 149, L 157   Time 4   Period Weeks   Status New   Target Date 03/30/17     CC Long Term Goal  #2   Title Pt will demonstrate 165 degrees of bilateral shoulder abduction to allow pt to reach out to sides   Baseline R 132, L 131   Time 4   Period Weeks   Status New   Target Date 03/30/17     CC Long Term Goal  #3   Title Pt will report a 75% improvement in right axillary tightness to allow improved comfort.    Time 4   Period Weeks   Status New   Target Date 03/30/17     CC Long Term Goal  #4   Title Pt will report no pain in right axilla when moving in directions that previously evoked pain   Time 4   Period Weeks   Status New   Target Date 03/30/17     CC Long Term Goal  #5   Title Pt will be independent in a home exercise program for continued strengthening and stretching   Time 4   Period Weeks   Status New              Plan - 03/02/17 1657    Clinical Impression Statement Pt presents to PT following R lumpectomy and SLNB in August 2013 for treatment of right breast cancer. She underwent radiation and is currently taking Tamoxifen. She has been having tightness in her right axilla since surgery. She has sharp pains occasionally when she moves a certain way in this area. Palpation reveals increased tightness in right axilla and under right breast. She has deccreased bilateral shoulder ROM with right worse than left. 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 She also presents with some possible swelling in right inferior breast in area of tightness. Pt would benefit from skilled PT services to increase bilateral shoulder ROM, decrease pain, decrease right axillary and breast tightness.   History and Personal Factors relevant to plan of care: has been a long time since lumpectomy - 5 years   Clinical Presentation Stable   Clinical Presentation due to: done with treatments   Clinical Decision Making  Low   Rehab Potential Good   Clinical Impairments Affecting Rehab Potential 5 years since surgery   PT Frequency 2x / week   PT Duration 4 weeks   PT Treatment/Interventions ADLs/Self Care Home Management;Therapeutic activities;Therapeutic exercise;Orthotic Fit/Training;Patient/family education;Manual techniques;Manual lymph drainage;Scar mobilization;Passive range of motion;Taping   PT Next Visit Plan issue HEP with supine cane, do P/AA/AROM to bilateral shoulders, myofascial to Right axilla and inferior breast   Consulted and Agree with Plan of Care Patient      Patient will benefit from skilled therapeutic intervention in order to improve the following deficits and impairments:  Pain, Increased fascial restricitons, Decreased scar mobility, Postural dysfunction, Decreased strength, Decreased range of motion, Decreased knowledge of precautions, Increased edema  Visit  Diagnosis: Stiffness of right shoulder, not elsewhere classified - Plan: PT plan of care cert/re-cert  Disorder of the skin and subcutaneous tissue related to radiation, unspecified - Plan: PT plan of care cert/re-cert  Stiffness of left shoulder, not elsewhere classified - Plan: PT plan of care cert/re-cert  Muscle weakness (generalized) - Plan: PT plan of care cert/re-cert  Abnormal posture - Plan: PT plan of care cert/re-cert      G-Codes - 67/61/95 1707    Functional Assessment Tool Used (Outpatient Only) Quick Dash   Functional Limitation Carrying, moving and handling objects   Carrying, Moving and Handling Objects Current Status (K9326) At least 20 percent but less than 40 percent impaired, limited or restricted   Carrying, Moving and Handling Objects Goal Status (Z1245) At least 1 percent but less than 20 percent impaired, limited or restricted       Problem List Patient Active Problem List   Diagnosis Date Noted  . Headache 02/21/2017  . BMI 33.0-33.9,adult 08/09/2016  . Increased endometrial stripe thickness 03/18/2016  . [place] arthritis 01/22/2015  . Counseling regarding end of life decision making 01/22/2015  . Osteoporosis 01/28/2014  . Hearing loss in right ear 01/20/2014  . Personal history of colonic polyps-adenomas 05/25/2012  . GERD (gastroesophageal reflux disease)   . History of radiation therapy   . Cancer of right breast, stage 0 01/26/2012  . Prediabetes 11/22/2011  . High cholesterol 11/22/2011  . TEMPOROMANDIBULAR JOINT PAIN 11/04/2009  . COPD (chronic obstructive pulmonary disease) with chronic bronchitis ([place]) 05/04/2009  . RENAL CALCULUS 05/04/2009  . Post herpetic neuralgia 04/14/2009    Allyson Sabal [facility] 03/02/2017, 5:10 PM  [place], [place], [address] Phone: [phone]   Fax:  [phone]  Name: Katie Zimmerman MRN: [id number] Date of Birth:  [birth year]  Manus Gunning, PT 03/02/17 5:10 PM

## 2017-03-06 ENCOUNTER — Ambulatory Visit: Payer: Medicare Other | Admitting: Physical Therapy

## 2017-03-06 ENCOUNTER — Encounter: Payer: Self-pay | Admitting: Physical Therapy

## 2017-03-06 DIAGNOSIS — L599 Disorder of the skin and subcutaneous tissue related to radiation, unspecified: Secondary | ICD-10-CM | POA: Diagnosis not present

## 2017-03-06 DIAGNOSIS — M25611 Stiffness of right shoulder, not elsewhere classified: Secondary | ICD-10-CM | POA: Diagnosis not present

## 2017-03-06 DIAGNOSIS — M25612 Stiffness of left shoulder, not elsewhere classified: Secondary | ICD-10-CM | POA: Diagnosis not present

## 2017-03-06 DIAGNOSIS — M6281 Muscle weakness (generalized): Secondary | ICD-10-CM | POA: Diagnosis not present

## 2017-03-06 DIAGNOSIS — R293 Abnormal posture: Secondary | ICD-10-CM | POA: Diagnosis not present

## 2017-03-06 NOTE — Therapy (Signed)
[place], [place], [address] Phone: [phone]   Fax:  [phone]  Physical Therapy Treatment  Patient Details  Name: Katie Zimmerman MRN: [id number] Date of Birth: 07-Dec-1945 Referring Provider: Dr. Donne Hazel  Encounter Date: 03/06/2017      PT End of Session - 03/06/17 1703    Visit Number 2   Number of Visits 9   Date for PT Re-Evaluation 03/30/17   PT Start Time 5102   PT Stop Time 1517   PT Time Calculation (min) 41 min   Activity Tolerance Patient tolerated treatment well   Behavior During Therapy [facility] for tasks assessed/performed      Past Medical History:  Diagnosis Date  . Breast cancer ([place]) 12/2011  . Breast cancer ([place]) 12/2011   DCIS  . Complication of anesthesia    hard to wake up age 71-not since  . Fibromyalgia   . GERD (gastroesophageal reflux disease)   . History of radiation therapy 02/29/12 -04/21/12   right breast, 5040 cGy in 28 fx, boost to total 6240 cGy  . Osteoporosis   . Personal history of colonic polyps-adenomas 05/25/2012  . Personal history of radiation therapy   . PHN (postherpetic neuralgia) 2000   Right side of face    Past Surgical History:  Procedure Laterality Date  . APPENDECTOMY    . BIOPSY BREAST  01/10/12   Right breast needle cor biopsy  . BREAST BIOPSY  01/10/2012  . BREAST CYST ASPIRATION  01/10/2012  . BREAST LUMPECTOMY Right 01/26/2012   Right/ERPR+ DCIS, right ax snbx  . FOOT SURGERY  1996   rt/lt great toes  . GLAUCOMA SURGERY  1993   laser  . OVARIAN CYST SURGERY  1964  . TUBAL LIGATION  1976    There were no vitals filed for this visit.      Subjective Assessment - 03/06/17 1438    Subjective My arm is feeling pretty good. On Friday or Saturday I reached a certain way and it really hurt again.    Pertinent History DCIS R lumpectomy and SLNB in August 2013 followed by radiation and currently taking Tamoxifen   Patient Stated Goals  to get to where I don't have that catch in my armpit   Currently in Pain? No/denies   Multiple Pain Sites No                         [facility] Adult PT Treatment/Exercise - 03/06/17 0001      Shoulder Exercises: Supine   Flexion AAROM;Both;10 reps  with dowel with 10 sec holds   ABduction AAROM;Both;10 reps  with dowel with 10 sec holds     Manual Therapy   Manual Therapy Soft tissue mobilization;Passive ROM;Myofascial release   Soft tissue mobilization to inferior breast, right lateral trunk and right axilla in areas of fibrosis   Myofascial Release pulling into abduction to help stretch    Passive ROM to right shoulder in direction of flexion and abduction                        Long Term Clinic Goals - 03/02/17 1704      CC Long Term Goal  #1   Title Pt will demonstrate 165 degrees of bilateral shoulder flexion to allow pt to reach upwards without axillary stretching on R   Baseline R 149, L 157   Time 4   Period  Weeks   Status New   Target Date 03/30/17     CC Long Term Goal  #2   Title Pt will demonstrate 165 degrees of bilateral shoulder abduction to allow pt to reach out to sides   Baseline R 132, L 131   Time 4   Period Weeks   Status New   Target Date 03/30/17     CC Long Term Goal  #3   Title Pt will report a 75% improvement in right axillary tightness to allow improved comfort.    Time 4   Period Weeks   Status New   Target Date 03/30/17     CC Long Term Goal  #4   Title Pt will report no pain in right axilla when moving in directions that previously evoked pain   Time 4   Period Weeks   Status New   Target Date 03/30/17     CC Long Term Goal  #5   Title Pt will be independent in a home exercise program for continued strengthening and stretching   Time 4   Period Weeks   Status New            Plan - 03/06/17 1703    Clinical Impression Statement Pt demonstrated full R shoulder PROM today with tightness in her  right axilla at end range. She has increased fibrosis in right lateral trunk, right axilla and right inferior breast. Performed soft tissue mobilization to these areas with softening noticable at end of session. Pt stated it felt better at end of session.    Rehab Potential Good   Clinical Impairments Affecting Rehab Potential 5 years since surgery   PT Frequency 2x / week   PT Duration 4 weeks   PT Treatment/Interventions ADLs/Self Care Home Management;Therapeutic activities;Therapeutic exercise;Orthotic Fit/Training;Patient/family education;Manual techniques;Manual lymph drainage;Scar mobilization;Passive range of motion;Taping   PT Next Visit Plan  do P/AA/AROM to bilateral shoulders, myofascial to Right axilla and inferior breast   PT Home Exercise Plan supine cane exercises   Consulted and Agree with Plan of Care Patient      Patient will benefit from skilled therapeutic intervention in order to improve the following deficits and impairments:  Pain, Increased fascial restricitons, Decreased scar mobility, Postural dysfunction, Decreased strength, Decreased range of motion, Decreased knowledge of precautions, Increased edema  Visit Diagnosis: Stiffness of right shoulder, not elsewhere classified  Disorder of the skin and subcutaneous tissue related to radiation, unspecified     Problem List Patient Active Problem List   Diagnosis Date Noted  . Headache 02/21/2017  . BMI 33.0-33.9,adult 08/09/2016  . Increased endometrial stripe thickness 03/18/2016  . [place] arthritis 01/22/2015  . Counseling regarding end of life decision making 01/22/2015  . Osteoporosis 01/28/2014  . Hearing loss in right ear 01/20/2014  . Personal history of colonic polyps-adenomas 05/25/2012  . GERD (gastroesophageal reflux disease)   . History of radiation therapy   . Cancer of right breast, stage 0 01/26/2012  . Prediabetes 11/22/2011  . High cholesterol 11/22/2011  . TEMPOROMANDIBULAR JOINT PAIN  11/04/2009  . COPD (chronic obstructive pulmonary disease) with chronic bronchitis ([place]) 05/04/2009  . RENAL CALCULUS 05/04/2009  . Post herpetic neuralgia 04/14/2009    Allyson Sabal [facility] 03/06/2017, 5:05 PM  [place], [place], [address] Phone: [phone]   Fax:  [phone]  Name: Katie Zimmerman MRN: [id number] Date of Birth: [birth year]  Manus Gunning, PT 03/06/17 5:05 PM

## 2017-03-06 NOTE — Patient Instructions (Signed)
Shoulder: Flexion (Supine)    With hands shoulder width apart, slowly lower dowel to floor behind head. Do not let elbows bend. Keep back flat. Hold _10___ seconds. Repeat _10___ times. Do __2__ sessions per day. CAUTION: Stretch slowly and gently.  Copyright  VHI. All rights reserved.  Shoulder: Abduction (Supine)    With right arm flat on floor, hold dowel in palm. Slowly move arm up to side of head by pushing with opposite arm. Do not let elbow bend. Hold _10___ seconds. Repeat __10__ times. Do __2__ sessions per day. Repeat on opposite arm.  CAUTION: Stretch slowly and gently.  Copyright  VHI. All rights reserved.

## 2017-03-09 ENCOUNTER — Ambulatory Visit: Payer: Medicare Other | Admitting: Physical Therapy

## 2017-03-09 ENCOUNTER — Encounter: Payer: Self-pay | Admitting: Physical Therapy

## 2017-03-09 DIAGNOSIS — R293 Abnormal posture: Secondary | ICD-10-CM | POA: Diagnosis not present

## 2017-03-09 DIAGNOSIS — M25611 Stiffness of right shoulder, not elsewhere classified: Secondary | ICD-10-CM

## 2017-03-09 DIAGNOSIS — L599 Disorder of the skin and subcutaneous tissue related to radiation, unspecified: Secondary | ICD-10-CM

## 2017-03-09 DIAGNOSIS — M6281 Muscle weakness (generalized): Secondary | ICD-10-CM | POA: Diagnosis not present

## 2017-03-09 DIAGNOSIS — M25612 Stiffness of left shoulder, not elsewhere classified: Secondary | ICD-10-CM | POA: Diagnosis not present

## 2017-03-09 NOTE — Therapy (Signed)
[place], [place], [address] Phone: [phone]   Fax:  [phone]  Physical Therapy Treatment  Patient Details  Name: Katie Zimmerman MRN: [id number] Date of Birth: 1945/06/15 Referring Provider: Dr. Donne Hazel  Encounter Date: 03/09/2017      PT End of Session - 03/09/17 1432    Visit Number 3   Number of Visits 9   Date for PT Re-Evaluation 03/30/17   PT Start Time 3536   PT Stop Time 1432   PT Time Calculation (min) 44 min   Activity Tolerance Patient tolerated treatment well   Behavior During Therapy [facility] for tasks assessed/performed      Past Medical History:  Diagnosis Date  . Breast cancer ([place]) 12/2011  . Breast cancer ([place]) 12/2011   DCIS  . Complication of anesthesia    hard to wake up age 40-not since  . Fibromyalgia   . GERD (gastroesophageal reflux disease)   . History of radiation therapy 02/29/12 -04/21/12   right breast, 5040 cGy in 28 fx, boost to total 6240 cGy  . Osteoporosis   . Personal history of colonic polyps-adenomas 05/25/2012  . Personal history of radiation therapy   . PHN (postherpetic neuralgia) 2000   Right side of face    Past Surgical History:  Procedure Laterality Date  . APPENDECTOMY    . BIOPSY BREAST  01/10/12   Right breast needle cor biopsy  . BREAST BIOPSY  01/10/2012  . BREAST CYST ASPIRATION  01/10/2012  . BREAST LUMPECTOMY Right 01/26/2012   Right/ERPR+ DCIS, right ax snbx  . FOOT SURGERY  1996   rt/lt great toes  . GLAUCOMA SURGERY  1993   laser  . OVARIAN CYST SURGERY  1964  . TUBAL LIGATION  1976    There were no vitals filed for this visit.      Subjective Assessment - 03/09/17 1351    Subjective My back pain was better after I was here last time. I think this has helped my back. My shoulder felt good. The tightness came back a little but not like it was.    Pertinent History DCIS R lumpectomy and SLNB in August 2013 followed by  radiation and currently taking Tamoxifen   Patient Stated Goals to get to where I don't have that catch in my armpit   Currently in Pain? No/denies                         [facility] Adult PT Treatment/Exercise - 03/09/17 0001      Shoulder Exercises: Pulleys   Flexion 2 minutes   ABduction 2 minutes     Shoulder Exercises: Therapy Ball   Flexion 10 reps  with stretch at end range   ABduction 10 reps  with stretch at end range     Manual Therapy   Manual Therapy Soft tissue mobilization;Passive ROM;Myofascial release   Soft tissue mobilization to inferior breast, right lateral trunk and right axilla in areas of fibrosis   Myofascial Release pulling into abduction to help stretch to bilateral shoulders   Passive ROM to right and left shoulder in direction of flexion and abduction                        Long Term Clinic Goals - 03/02/17 1704      CC Long Term Goal  #1   Title Pt will demonstrate 165 degrees of bilateral  shoulder flexion to allow pt to reach upwards without axillary stretching on R   Baseline R 149, L 157   Time 4   Period Weeks   Status New   Target Date 03/30/17     CC Long Term Goal  #2   Title Pt will demonstrate 165 degrees of bilateral shoulder abduction to allow pt to reach out to sides   Baseline R 132, L 131   Time 4   Period Weeks   Status New   Target Date 03/30/17     CC Long Term Goal  #3   Title Pt will report a 75% improvement in right axillary tightness to allow improved comfort.    Time 4   Period Weeks   Status New   Target Date 03/30/17     CC Long Term Goal  #4   Title Pt will report no pain in right axilla when moving in directions that previously evoked pain   Time 4   Period Weeks   Status New   Target Date 03/30/17     CC Long Term Goal  #5   Title Pt will be independent in a home exercise program for continued strengthening and stretching   Time 4   Period Weeks   Status New             Plan - 03/09/17 1618    Clinical Impression Statement Continued with soft tissue mobilization to breast with increased softening noted. Also performed soft tissue mobilization to her axilla while performing PROM to right shoulder. Added AAROM exercises today including ball up wall and pulleys.    Rehab Potential Good   Clinical Impairments Affecting Rehab Potential 5 years since surgery   PT Frequency 2x / week   PT Duration 4 weeks   PT Treatment/Interventions ADLs/Self Care Home Management;Therapeutic activities;Therapeutic exercise;Orthotic Fit/Training;Patient/family education;Manual techniques;Manual lymph drainage;Scar mobilization;Passive range of motion;Taping   PT Next Visit Plan  do P/AA/AROM to bilateral shoulders, myofascial to Right axilla and inferior breast   PT Home Exercise Plan supine cane exercises   Consulted and Agree with Plan of Care Patient      Patient will benefit from skilled therapeutic intervention in order to improve the following deficits and impairments:  Pain, Increased fascial restricitons, Decreased scar mobility, Postural dysfunction, Decreased strength, Decreased range of motion, Decreased knowledge of precautions, Increased edema  Visit Diagnosis: Stiffness of right shoulder, not elsewhere classified  Disorder of the skin and subcutaneous tissue related to radiation, unspecified  Stiffness of left shoulder, not elsewhere classified     Problem List Patient Active Problem List   Diagnosis Date Noted  . Headache 02/21/2017  . BMI 33.0-33.9,adult 08/09/2016  . Increased endometrial stripe thickness 03/18/2016  . [place] arthritis 01/22/2015  . Counseling regarding end of life decision making 01/22/2015  . Osteoporosis 01/28/2014  . Hearing loss in right ear 01/20/2014  . Personal history of colonic polyps-adenomas 05/25/2012  . GERD (gastroesophageal reflux disease)   . History of radiation therapy   . Cancer of right breast, stage 0  01/26/2012  . Prediabetes 11/22/2011  . High cholesterol 11/22/2011  . TEMPOROMANDIBULAR JOINT PAIN 11/04/2009  . COPD (chronic obstructive pulmonary disease) with chronic bronchitis ([place]) 05/04/2009  . RENAL CALCULUS 05/04/2009  . Post herpetic neuralgia 04/14/2009    Allyson Sabal [facility] 03/09/2017, 4:19 PM  [place], [place], [address] Phone: [phone]   Fax:  [phone]  Name: Bich Mchaney  [place] MRN: [id number] Date of Birth: 06-24-1945  Manus Gunning, PT 03/09/17 4:20 PM

## 2017-03-13 ENCOUNTER — Encounter: Payer: Medicare Other | Admitting: Physical Therapy

## 2017-03-22 ENCOUNTER — Encounter: Payer: Self-pay | Admitting: Physical Therapy

## 2017-03-22 ENCOUNTER — Ambulatory Visit: Payer: Medicare Other | Admitting: Physical Therapy

## 2017-03-22 DIAGNOSIS — R293 Abnormal posture: Secondary | ICD-10-CM

## 2017-03-22 DIAGNOSIS — M25612 Stiffness of left shoulder, not elsewhere classified: Secondary | ICD-10-CM

## 2017-03-22 DIAGNOSIS — M25611 Stiffness of right shoulder, not elsewhere classified: Secondary | ICD-10-CM | POA: Diagnosis not present

## 2017-03-22 DIAGNOSIS — M6281 Muscle weakness (generalized): Secondary | ICD-10-CM

## 2017-03-22 NOTE — Patient Instructions (Signed)

## 2017-03-22 NOTE — Therapy (Signed)
[place], [place], [address] Phone: [phone]   Fax:  [phone]  Physical Therapy Treatment  Patient Details  Name: Katie Zimmerman MRN: [id number] Date of Birth: [birth year] Referring Provider: Dr. Donne Hazel  Encounter Date: 03/22/2017      PT End of Session - 03/22/17 1615    Visit Number 4   Number of Visits 9   Date for PT Re-Evaluation 03/30/17   PT Start Time 6144   PT Stop Time 1650   PT Time Calculation (min) 45 min   Activity Tolerance Patient tolerated treatment well   Behavior During Therapy [facility] for tasks assessed/performed      Past Medical History:  Diagnosis Date  . Breast cancer ([place]) 12/2011  . Breast cancer ([place]) 12/2011   DCIS  . Complication of anesthesia    hard to wake up age 71-not since  . Fibromyalgia   . GERD (gastroesophageal reflux disease)   . History of radiation therapy 02/29/12 -04/21/12   right breast, 5040 cGy in 28 fx, boost to total 6240 cGy  . Osteoporosis   . Personal history of colonic polyps-adenomas 05/25/2012  . Personal history of radiation therapy   . PHN (postherpetic neuralgia) 2000   Right side of face    Past Surgical History:  Procedure Laterality Date  . APPENDECTOMY    . BIOPSY BREAST  01/10/12   Right breast needle cor biopsy  . BREAST BIOPSY  01/10/2012  . BREAST CYST ASPIRATION  01/10/2012  . BREAST LUMPECTOMY Right 01/26/2012   Right/ERPR+ DCIS, right ax snbx  . FOOT SURGERY  1996   rt/lt great toes  . GLAUCOMA SURGERY  1993   laser  . OVARIAN CYST SURGERY  1964  . TUBAL LIGATION  1976    There were no vitals filed for this visit.      Subjective Assessment - 03/22/17 1607    Subjective I can reach better with my shoulder. I could not come last week because I didn't have a vehicle.    Pertinent History DCIS R lumpectomy and SLNB in August 2013 followed by radiation and currently taking Tamoxifen   Patient Stated Goals to  get to where I don't have that catch in my armpit   Currently in Pain? No/denies   Multiple Pain Sites No            [facility] PT Assessment - 03/22/17 0001      AROM   Right Shoulder Flexion 160 Degrees   Right Shoulder ABduction 173 Degrees   Left Shoulder Flexion 160 Degrees   Left Shoulder ABduction 160 Degrees                     [facility] Adult PT Treatment/Exercise - 03/22/17 0001      Shoulder Exercises: Supine   Horizontal ABduction Strengthening;Both;10 reps;Theraband   Theraband Level (Shoulder Horizontal ABduction) Level 1 (Yellow)   External Rotation Strengthening;Both;10 reps;Theraband   Theraband Level (Shoulder External Rotation) Level 1 (Yellow)   Flexion Strengthening;Both;10 reps;Theraband  narrow and wide grip   Theraband Level (Shoulder Flexion) Level 1 (Yellow)   Other Supine Exercises D2 with yellow theraband x 10 reps bilaterally     Shoulder Exercises: Pulleys   Flexion 2 minutes   ABduction 2 minutes     Shoulder Exercises: Therapy Ball   Flexion 10 reps  with stretch at end range   ABduction 10 reps  with stretch at end range  Manual Therapy   Passive ROM briefly to right and left shoulder in direction of flexion and abduction                        Long Term Clinic Goals - 03/22/17 1608      CC Long Term Goal  #1   Title Pt will demonstrate 165 degrees of bilateral shoulder flexion to allow pt to reach upwards without axillary stretching on R   Baseline R 149, L 157, 03/22/17- R 160, L 160    Time 4   Period Weeks   Status On-going     CC Long Term Goal  #2   Title Pt will demonstrate 165 degrees of bilateral shoulder abduction to allow pt to reach out to sides   Baseline R 132, L 131, 03/22/17- 173 on R, 160 on L   Time 4   Period Weeks   Status On-going     CC Long Term Goal  #3   Title Pt will report a 75% improvement in right axillary tightness to allow improved comfort.    Baseline 03/22/17- 50%  improvement   Time 4   Period Weeks   Status On-going     CC Long Term Goal  #4   Title Pt will report no pain in right axilla when moving in directions that previously evoked pain   Baseline 03/22/17- pt states this has improved greatly but she still has a little pain   Time 4   Period Weeks   Status On-going     CC Long Term Goal  #5   Title Pt will be independent in a home exercise program for continued strengthening and stretching   Time 4   Period Weeks   Status On-going            Plan - 03/22/17 1653    Clinical Impression Statement Assessed pt's progress towards goals in therapy. She is progressing towards all of her goals. Issued supine scapular strengthening exercises today and added these to pt's HEP since her ROM has improved so much.    Rehab Potential Good   Clinical Impairments Affecting Rehab Potential 5 years since surgery   PT Frequency 2x / week   PT Duration 4 weeks   PT Treatment/Interventions ADLs/Self Care Home Management;Therapeutic activities;Therapeutic exercise;Orthotic Fit/Training;Patient/family education;Manual techniques;Manual lymph drainage;Scar mobilization;Passive range of motion;Taping   PT Next Visit Plan  strengthening exercises, assess indep with supine scap, do P/AA/AROM to bilateral shoulders, myofascial to Right axilla and inferior breast   PT Home Exercise Plan supine cane exercises, supine scap exercises   Consulted and Agree with Plan of Care Patient      Patient will benefit from skilled therapeutic intervention in order to improve the following deficits and impairments:  Pain, Increased fascial restricitons, Decreased scar mobility, Postural dysfunction, Decreased strength, Decreased range of motion, Decreased knowledge of precautions, Increased edema  Visit Diagnosis: Stiffness of right shoulder, not elsewhere classified  Stiffness of left shoulder, not elsewhere classified  Muscle weakness (generalized)  Abnormal  posture     Problem List Patient Active Problem List   Diagnosis Date Noted  . Headache 02/21/2017  . BMI 33.0-33.9,adult 08/09/2016  . Increased endometrial stripe thickness 03/18/2016  . [place] arthritis 01/22/2015  . Counseling regarding end of life decision making 01/22/2015  . Osteoporosis 01/28/2014  . Hearing loss in right ear 01/20/2014  . Personal history of colonic polyps-adenomas 05/25/2012  . GERD (gastroesophageal reflux disease)   .  History of radiation therapy   . Cancer of right breast, stage 0 01/26/2012  . Prediabetes 11/22/2011  . High cholesterol 11/22/2011  . TEMPOROMANDIBULAR JOINT PAIN 11/04/2009  . COPD (chronic obstructive pulmonary disease) with chronic bronchitis ([place]) 05/04/2009  . RENAL CALCULUS 05/04/2009  . Post herpetic neuralgia 04/14/2009    Allyson Sabal [facility] 03/22/2017, 4:58 PM  [place], [place], [address] Phone: [phone]   Fax:  [phone]  Name: ALANIA OVERHOLT MRN: [id number] Date of Birth: 1945/12/13  Manus Gunning, PT 03/22/17 4:58 PM

## 2017-03-24 ENCOUNTER — Ambulatory Visit: Payer: Medicare Other | Admitting: Physical Therapy

## 2017-03-27 ENCOUNTER — Encounter: Payer: Medicare Other | Admitting: Physical Therapy

## 2017-03-29 ENCOUNTER — Ambulatory Visit: Payer: Medicare Other

## 2017-04-04 ENCOUNTER — Ambulatory Visit: Payer: Medicare Other | Attending: General Surgery | Admitting: Physical Therapy

## 2017-04-04 DIAGNOSIS — M6281 Muscle weakness (generalized): Secondary | ICD-10-CM | POA: Diagnosis not present

## 2017-04-04 DIAGNOSIS — L599 Disorder of the skin and subcutaneous tissue related to radiation, unspecified: Secondary | ICD-10-CM | POA: Insufficient documentation

## 2017-04-04 DIAGNOSIS — R293 Abnormal posture: Secondary | ICD-10-CM | POA: Diagnosis not present

## 2017-04-04 DIAGNOSIS — M25611 Stiffness of right shoulder, not elsewhere classified: Secondary | ICD-10-CM | POA: Diagnosis not present

## 2017-04-04 DIAGNOSIS — M25612 Stiffness of left shoulder, not elsewhere classified: Secondary | ICD-10-CM | POA: Insufficient documentation

## 2017-04-04 NOTE — Therapy (Signed)
[place], [place], [address] Phone: [phone]   Fax:  [phone]  Physical Therapy Treatment  Patient Details  Name: Katie Zimmerman MRN: [id number] Date of Birth: Mar 25, 1946 Referring Provider: Dr. Donne Hazel   Encounter Date: 04/04/2017  PT End of Session - 04/04/17 1655    Visit Number  5    Number of Visits  9    Date for PT Re-Evaluation  04/28/17    PT Start Time  4650    PT Stop Time  1517    PT Time Calculation (min)  45 min    Activity Tolerance  Patient tolerated treatment well    Behavior During Therapy  [facility] for tasks assessed/performed       Past Medical History:  Diagnosis Date  . Breast cancer ([place]) 12/2011  . Breast cancer ([place]) 12/2011   DCIS  . Complication of anesthesia    hard to wake up age 62-not since  . Fibromyalgia   . GERD (gastroesophageal reflux disease)   . History of radiation therapy 02/29/12 -04/21/12   right breast, 5040 cGy in 28 fx, boost to total 6240 cGy  . Osteoporosis   . Personal history of colonic polyps-adenomas 05/25/2012  . Personal history of radiation therapy   . PHN (postherpetic neuralgia) 2000   Right side of face    Past Surgical History:  Procedure Laterality Date  . APPENDECTOMY    . BIOPSY BREAST  01/10/12   Right breast needle cor biopsy  . BREAST BIOPSY  01/10/2012  . BREAST CYST ASPIRATION  01/10/2012  . BREAST LUMPECTOMY Right 01/26/2012   Right/ERPR+ DCIS, right ax snbx  . FOOT SURGERY  1996   rt/lt great toes  . GLAUCOMA SURGERY  1993   laser  . OVARIAN CYST SURGERY  1964  . TUBAL LIGATION  1976    There were no vitals filed for this visit.  Subjective Assessment - 04/04/17 1436    Subjective  "She worked me so hard the last time I was in here I was sore.  Soreness only lasted about a day. I'm not getting the catches as bad as I was. These are both less frequent and less intense. Rt. axilla and upper arm is not feeling as  weird as it did--now feels like a smaller ball there.    Pertinent History  DCIS R lumpectomy and SLNB in August 2013 followed by radiation and currently taking Tamoxifen    Currently in Pain?  No/denies                      [facility] Adult PT Treatment/Exercise - 04/04/17 0001      Shoulder Exercises: Supine   Horizontal ABduction  Strengthening;Both;10 reps;Theraband    Theraband Level (Shoulder Horizontal ABduction)  Level 1 (Yellow)    External Rotation  Strengthening;Both;10 reps;Theraband    Theraband Level (Shoulder External Rotation)  Level 1 (Yellow)    Flexion  Strengthening;Both;10 reps;Theraband narrow and wide grip   narrow and wide grip   Theraband Level (Shoulder Flexion)  Level 1 (Yellow)    Other Supine Exercises  D2 with yellow theraband x 10 reps bilaterally      Shoulder Exercises: Seated   Other Seated Exercises  backward shoulder rolls at end of session x 10      Shoulder Exercises: Sidelying   Other Sidelying Exercises  Pt. in left sidelying, active D2 with right arm, held 10 counts each x 5  Shoulder Exercises: Pulleys   Flexion  2 minutes    ABduction  2 minutes      Shoulder Exercises: Therapy Ball   Flexion  10 reps with stretch at end range   with stretch at end range   ABduction  10 reps with stretch at end range   with stretch at end range     Manual Therapy   Manual Therapy  Scapular mobilization    Myofascial Release  in left sidelying with one hand on right upper arm and one hand at right flank in four different spots (more posterior or more anterior on flank), release with two way stretch    Scapular Mobilization  to right in left sidelying with gentle rocking motions into protraction and depression                     Long Term Clinic Goals - 04/04/17 1659      CC Long Term Goal  #1   Title  Pt will demonstrate 165 degrees of bilateral shoulder flexion to allow pt to reach upwards without axillary stretching on  R    Baseline  R 149, L 157, 03/22/17- R 160, L 160     Status  Partially Met      CC Long Term Goal  #2   Title  Pt will demonstrate 165 degrees of bilateral shoulder abduction to allow pt to reach out to sides    Baseline  R 132, L 131, 03/22/17- 173 on R, 160 on L    Status  Partially Met      CC Long Term Goal  #3   Title  Pt will report a 75% improvement in right axillary tightness to allow improved comfort.     Baseline  03/22/17- 50% improvement    Status  Partially Met      CC Long Term Goal  #4   Title  Pt will report no pain in right axilla when moving in directions that previously evoked pain    Baseline  03/22/17- pt states this has improved greatly but she still has a little pain    Status  Partially Met      CC Long Term Goal  #5   Title  Pt will be independent in a home exercise program for continued strengthening and stretching    Status  On-going         Plan - 04/04/17 1656    Clinical Impression Statement  Pt. is doing well but will benefit from continued therapy to reach her goals, which she has made progress toward but has not met.  She reports significant decrease in frequency and intensity of the "catches" she was having and came her for. We reviewed the newer HEP with supine scapular series strengthening with yellow Theraband, and she benefitted from a few cues and corrections.    Rehab Potential  Good    PT Frequency  2x / week    PT Duration  4 weeks    PT Treatment/Interventions  ADLs/Self Care Home Management;Therapeutic activities;Therapeutic exercise;Orthotic Fit/Training;Patient/family education;Manual techniques;Manual lymph drainage;Scar mobilization;Passive range of motion;Taping    PT Next Visit Plan   strengthening exercises, do P/AA/AROM to bilateral shoulders, myofascial to Right axilla and inferior breast    PT Home Exercise Plan  supine cane exercises, supine scap exercises    Consulted and Agree with Plan of Care  Patient       Patient  will benefit from skilled therapeutic intervention  in order to improve the following deficits and impairments:  Pain, Increased fascial restricitons, Decreased scar mobility, Postural dysfunction, Decreased strength, Decreased range of motion, Decreased knowledge of precautions, Increased edema  Visit Diagnosis: Stiffness of right shoulder, not elsewhere classified - Plan: PT plan of care cert/re-cert  Stiffness of left shoulder, not elsewhere classified - Plan: PT plan of care cert/re-cert  Muscle weakness (generalized) - Plan: PT plan of care cert/re-cert  Abnormal posture - Plan: PT plan of care cert/re-cert  Disorder of the skin and subcutaneous tissue related to radiation, unspecified - Plan: PT plan of care cert/re-cert     Problem List Patient Active Problem List   Diagnosis Date Noted  . Headache 02/21/2017  . BMI 33.0-33.9,adult 08/09/2016  . Increased endometrial stripe thickness 03/18/2016  . [place] arthritis 01/22/2015  . Counseling regarding end of life decision making 01/22/2015  . Osteoporosis 01/28/2014  . Hearing loss in right ear 01/20/2014  . Personal history of colonic polyps-adenomas 05/25/2012  . GERD (gastroesophageal reflux disease)   . History of radiation therapy   . Cancer of right breast, stage 0 01/26/2012  . Prediabetes 11/22/2011  . High cholesterol 11/22/2011  . TEMPOROMANDIBULAR JOINT PAIN 11/04/2009  . COPD (chronic obstructive pulmonary disease) with chronic bronchitis ([place]) 05/04/2009  . RENAL CALCULUS 05/04/2009  . Post herpetic neuralgia 04/14/2009    Lindberg Zenon 04/04/2017, 5:04 PM  [place], [place], [address] Phone: [phone]   Fax:  [phone]  Name: Katie Zimmerman MRN: [id number] Date of Birth: 1945/08/12  Serafina Royals, PT 04/04/17 5:04 PM

## 2017-04-13 ENCOUNTER — Ambulatory Visit: Payer: Medicare Other | Admitting: Physical Therapy

## 2017-04-13 ENCOUNTER — Encounter: Payer: Self-pay | Admitting: Physical Therapy

## 2017-04-13 DIAGNOSIS — R293 Abnormal posture: Secondary | ICD-10-CM | POA: Diagnosis not present

## 2017-04-13 DIAGNOSIS — M25611 Stiffness of right shoulder, not elsewhere classified: Secondary | ICD-10-CM

## 2017-04-13 DIAGNOSIS — M6281 Muscle weakness (generalized): Secondary | ICD-10-CM

## 2017-04-13 DIAGNOSIS — L599 Disorder of the skin and subcutaneous tissue related to radiation, unspecified: Secondary | ICD-10-CM | POA: Diagnosis not present

## 2017-04-13 DIAGNOSIS — M25612 Stiffness of left shoulder, not elsewhere classified: Secondary | ICD-10-CM | POA: Diagnosis not present

## 2017-04-13 NOTE — Therapy (Addendum)
[place], [place], [address] Phone: [phone]   Fax:  [phone]  Physical Therapy Treatment  Patient Details  Name: Katie Zimmerman MRN: [id number] Date of Birth: Sep 10, 1945 Referring Provider: Dr. Donne Hazel   Encounter Date: 04/13/2017  PT End of Session - 04/13/17 1708    Visit Number  6    Number of Visits  9    Date for PT Re-Evaluation  04/28/17    PT Start Time  1440 pt arrived late    PT Stop Time  1518    PT Time Calculation (min)  38 min    Activity Tolerance  Patient tolerated treatment well    Behavior During Therapy  [facility] for tasks assessed/performed       Past Medical History:  Diagnosis Date  . Breast cancer ([place]) 12/2011  . Breast cancer ([place]) 12/2011   DCIS  . Complication of anesthesia    hard to wake up age 71-not since  . Fibromyalgia   . GERD (gastroesophageal reflux disease)   . History of radiation therapy 02/29/12 -04/21/12   right breast, 5040 cGy in 28 fx, boost to total 6240 cGy  . Osteoporosis   . Personal history of colonic polyps-adenomas 05/25/2012  . Personal history of radiation therapy   . PHN (postherpetic neuralgia) 2000   Right side of face    Past Surgical History:  Procedure Laterality Date  . APPENDECTOMY    . BIOPSY BREAST  01/10/12   Right breast needle cor biopsy  . BREAST BIOPSY  01/10/2012  . BREAST CYST ASPIRATION  01/10/2012  . BREAST LUMPECTOMY Right 01/26/2012   Right/ERPR+ DCIS, right ax snbx  . FOOT SURGERY  1996   rt/lt great toes  . GLAUCOMA SURGERY  1993   laser  . OVARIAN CYST SURGERY  1964  . TUBAL LIGATION  1976    There were no vitals filed for this visit.  Subjective Assessment - 04/13/17 1442    Subjective  I am supposed to see Dr. Lindi Adie next week for my yearly follow up. My shoulder is feeling pretty good. I have not had any of those catches lately. It has really been helping.     Pertinent History  DCIS R lumpectomy and  SLNB in August 2013 followed by radiation and currently taking Tamoxifen    Patient Stated Goals  to get to where I don't have that catch in my armpit    Currently in Pain?  No/denies                      [facility] Adult PT Treatment/Exercise - 04/13/17 0001      Shoulder Exercises: Supine   Horizontal ABduction  Strengthening;Both;10 reps;Theraband    Theraband Level (Shoulder Horizontal ABduction)  Level 2 (Red)    External Rotation  Strengthening;Both;10 reps;Theraband    Theraband Level (Shoulder External Rotation)  Level 2 (Red)    Flexion  Strengthening;Both;10 reps;Theraband narrow and wide grip    Theraband Level (Shoulder Flexion)  Level 2 (Red)    Other Supine Exercises  D2 with red theraband x 10 reps bilaterally      Manual Therapy   Manual Therapy  Scapular mobilization    Myofascial Release  in left sidelying with one hand on right upper arm and one hand at right flank, also did myofascial release in supine to right axilla    Scapular Mobilization  to right in left sidelying with gentle rocking  motions into protraction and depression                     Long Term Clinic Goals - 04/13/17 1510      CC Long Term Goal  #1   Title  Pt will demonstrate 165 degrees of bilateral shoulder flexion to allow pt to reach upwards without axillary stretching on R    Baseline  R 149, L 157, 03/22/17- R 160, L 160, 04/13/17- R 166, L 166    Time  4    Period  Weeks    Status  Achieved      CC Long Term Goal  #2   Title  Pt will demonstrate 165 degrees of bilateral shoulder abduction to allow pt to reach out to sides    Baseline  R 132, L 131, 03/22/17- 173 on R, 160 on L, 04/13/17- R 165, L 166    Time  4    Period  Weeks    Status  Achieved      CC Long Term Goal  #3   Title  Pt will report a 75% improvement in right axillary tightness to allow improved comfort.     Baseline  03/22/17- 50% improvement, 04/13/17- 75% improvement    Time  4    Period   Weeks    Status  Achieved      CC Long Term Goal  #4   Title  Pt will report no pain in right axilla when moving in directions that previously evoked pain    Baseline  03/22/17- pt states this has improved greatly but she still has a little pain, 04/13/17- pt reports no pain     Time  4    Period  Weeks    Status  Achieved      CC Long Term Goal  #5   Title  Pt will be independent in a home exercise program for continued strengthening and stretching    Time  4    Period  Weeks    Status  On-going         Plan - 04/13/17 1709    Clinical Impression Statement  Assessed pt's progress towards goals in therapy. She has now met all goals except for her home exercise program goal. Will begin instruction in Strength ABC program and rockwood exercises next visit. Pt has full PROM and 165 degrees of flexion and abduction today. She did not have any pain in her right axilla.     Rehab Potential  Good    Clinical Impairments Affecting Rehab Potential  5 years since surgery    PT Frequency  2x / week    PT Duration  4 weeks    PT Treatment/Interventions  ADLs/Self Care Home Management;Therapeutic activities;Therapeutic exercise;Orthotic Fit/Training;Patient/family education;Manual techniques;Manual lymph drainage;Scar mobilization;Passive range of motion;Taping    PT Next Visit Plan   strengthening exercises, strength ABC program, rockwood    PT Home Exercise Plan  supine cane exercises, supine scap exercises    Consulted and Agree with Plan of Care  Patient       Patient will benefit from skilled therapeutic intervention in order to improve the following deficits and impairments:  Pain, Increased fascial restricitons, Decreased scar mobility, Postural dysfunction, Decreased strength, Decreased range of motion, Decreased knowledge of precautions, Increased edema  Visit Diagnosis: Stiffness of right shoulder, not elsewhere classified  Muscle weakness (generalized)  Abnormal  posture     Problem List Patient Active Problem  List   Diagnosis Date Noted  . Headache 02/21/2017  . BMI 33.0-33.9,adult 08/09/2016  . Increased endometrial stripe thickness 03/18/2016  . [place] arthritis 01/22/2015  . Counseling regarding end of life decision making 01/22/2015  . Osteoporosis 01/28/2014  . Hearing loss in right ear 01/20/2014  . Personal history of colonic polyps-adenomas 05/25/2012  . GERD (gastroesophageal reflux disease)   . History of radiation therapy   . Cancer of right breast, stage 0 01/26/2012  . Prediabetes 11/22/2011  . High cholesterol 11/22/2011  . TEMPOROMANDIBULAR JOINT PAIN 11/04/2009  . COPD (chronic obstructive pulmonary disease) with chronic bronchitis ([place]) 05/04/2009  . RENAL CALCULUS 05/04/2009  . Post herpetic neuralgia 04/14/2009    Allyson Sabal [facility] 04/13/2017, 5:14 PM  [place], [place], [address] Phone: [phone]   Fax:  [phone]  Name: Katie Zimmerman MRN: [id number] Date of Birth: 03-06-1946  Manus Gunning, PT 04/13/17 5:14 PM  PHYSICAL THERAPY DISCHARGE SUMMARY  Visits from Start of Care: 6  Current functional level related to goals / functional outcomes: See above   Remaining deficits: See above   Education / Equipment: See above  Plan: Patient agrees to discharge.  Patient goals were partially met. Patient is being discharged due to not returning since the last visit.  ?????     [facility] [place], [place] 06/26/17 10:50 AM

## 2017-04-17 ENCOUNTER — Other Ambulatory Visit: Payer: Self-pay | Admitting: Hematology and Oncology

## 2017-04-18 ENCOUNTER — Ambulatory Visit (HOSPITAL_BASED_OUTPATIENT_CLINIC_OR_DEPARTMENT_OTHER): Payer: Medicare Other | Admitting: Hematology and Oncology

## 2017-04-18 ENCOUNTER — Telehealth: Payer: Self-pay

## 2017-04-18 DIAGNOSIS — Z170 Estrogen receptor positive status [ER+]: Secondary | ICD-10-CM

## 2017-04-18 DIAGNOSIS — Z79810 Long term (current) use of selective estrogen receptor modulators (SERMs): Secondary | ICD-10-CM | POA: Diagnosis not present

## 2017-04-18 DIAGNOSIS — D0591 Unspecified type of carcinoma in situ of right breast: Secondary | ICD-10-CM

## 2017-04-18 DIAGNOSIS — D0511 Intraductal carcinoma in situ of right breast: Secondary | ICD-10-CM | POA: Diagnosis not present

## 2017-04-18 NOTE — Telephone Encounter (Signed)
Printed avs and callender for upcoming appointment. Added patient to schedule tomorrow  For labs. "ONLY" to drop off specimen already in drop box. Place a lable on package for identification.

## 2017-04-18 NOTE — Progress Notes (Signed)
Patient Care Team: [name], MD as PCP - General  DIAGNOSIS:  Encounter Diagnosis  Name Primary?  . Cancer of right breast, stage 0     SUMMARY OF ONCOLOGIC HISTORY:   Cancer of right breast, stage 0   01/10/2012 Initial Diagnosis    Right breast biopsy: DCIS mammogram ultrasound 9 mm abnormality ER 100% PR 100%      01/26/2012 Surgery    Right lumpectomy: DCIS low-grade 1 cm, with a ledge, margins negative, 0/3 sentinel nodes negative, ER 100%, PR 100% stage 0      02/27/2012 - 04/04/2012 Radiation Therapy    Adjuvant radiation therapy      05/08/2012 -  Anti-estrogen oral therapy    Tamoxifen 20 mg daily       CHIEF COMPLIANT: Follow-up on tamoxifen therapy  INTERVAL HISTORY: Katie Zimmerman is a 71 year old with above-mentioned history of right breast DCIS who underwent lumpectomy radiation is taken 5 years of tamoxifen therapy.  She will complete 5 years per December.  She is tolerating tamoxifen extremely well.  She denies any hot flashes or myalgias.  She denies any lumps or nodules in the breast.  REVIEW OF SYSTEMS:   Constitutional: Denies fevers, chills or abnormal weight loss Eyes: Denies blurriness of vision Ears, nose, mouth, throat, and face: Denies mucositis or sore throat Respiratory: Denies cough, dyspnea or wheezes Cardiovascular: Denies palpitation, chest discomfort Gastrointestinal:  Denies nausea, heartburn or change in bowel habits Skin: Denies abnormal skin rashes Lymphatics: Denies new lymphadenopathy or easy bruising Neurological:Denies numbness, tingling or new weaknesses Behavioral/Psych: Mood is stable, no new changes  Extremities: No lower extremity edema Breast:  denies any pain or lumps or nodules in either breasts All other systems were reviewed with the patient and are negative.  I have reviewed the past medical history, past surgical history, social history and family history with the patient and they are unchanged from previous  note.  ALLERGIES:  is allergic to penicillins; venlafaxine; and sulfonamide derivatives.  MEDICATIONS:  Current Outpatient Medications  Medication Sig Dispense Refill  . acetaminophen (TYLENOL) 500 MG tablet Take 1,000 mg by mouth every 4 (four) hours as needed for mild pain, moderate pain or headache.     . albuterol (PROVENTIL HFA;VENTOLIN HFA) 108 (90 Base) MCG/ACT inhaler Inhale 2 puffs into the lungs every 6 (six) hours as needed for wheezing or shortness of breath. 1 Inhaler 0  . cholecalciferol (VITAMIN D) 1000 units tablet Take 4,000 Units by mouth daily.    . fluticasone furoate-vilanterol (BREO ELLIPTA) 100-25 MCG/INH AEPB Inhale 1 puff then rinse mouth, once daily 60 each 12  . gabapentin (NEURONTIN) 300 MG capsule TAKE TWO CAPSULES BY MOUTH IN THE MORNING AND TAKE FOUR CAPSULES at bedtime 240 capsule 0  . promethazine (PHENERGAN) 25 MG tablet Take 1 tablet (25 mg total) by mouth every 6 (six) hours as needed for nausea or vomiting. (Patient not taking: Reported on 04/04/2017) 20 tablet 0  . tamoxifen (NOLVADEX) 20 MG tablet Take 1 tablet (20 mg total) by mouth daily. 90 tablet 3   No current facility-administered medications for this visit.     PHYSICAL EXAMINATION: ECOG PERFORMANCE STATUS: 0 - Asymptomatic  There were no vitals filed for this visit. There were no vitals filed for this visit.  GENERAL:alert, no distress and comfortable SKIN: skin color, texture, turgor are normal, no rashes or significant lesions EYES: normal, Conjunctiva are pink and non-injected, sclera clear OROPHARYNX:no exudate, no erythema and lips,  buccal mucosa, and tongue normal  NECK: supple, thyroid normal size, non-tender, without nodularity LYMPH:  no palpable lymphadenopathy in the cervical, axillary or inguinal LUNGS: clear to auscultation and percussion with normal breathing effort HEART: regular rate & rhythm and no murmurs and no lower extremity edema ABDOMEN:abdomen soft, non-tender and  normal bowel sounds MUSCULOSKELETAL:no cyanosis of digits and no clubbing  NEURO: alert & oriented x 3 with fluent speech, no focal motor/sensory deficits EXTREMITIES: No lower extremity edema BREAST: No palpable masses or nodules in either right or left breasts. No palpable axillary supraclavicular or infraclavicular adenopathy no breast tenderness or nipple discharge. (exam performed in the presence of a chaperone)  LABORATORY DATA:  I have reviewed the data as listed   Chemistry      Component Value Date/Time   NA 142 02/07/2017 0904   NA 144 02/20/2014 1133   K 4.8 02/07/2017 0904   K 3.9 02/20/2014 1133   CL 109 02/07/2017 0904   CL 110 (H) 11/12/2012 1216   CO2 26 02/07/2017 0904   CO2 26 02/20/2014 1133   BUN 9 02/07/2017 0904   BUN 15.5 02/20/2014 1133   CREATININE 0.79 02/07/2017 0904   CREATININE 0.9 02/20/2014 1133      Component Value Date/Time   CALCIUM 9.4 02/07/2017 0904   CALCIUM 9.5 02/20/2014 1133   ALKPHOS 61 02/07/2017 0904   ALKPHOS 73 02/20/2014 1133   AST 13 02/07/2017 0904   AST 19 02/20/2014 1133   ALT 12 02/07/2017 0904   ALT 21 02/20/2014 1133   BILITOT 0.3 02/07/2017 0904   BILITOT 0.34 02/20/2014 1133       Lab Results  Component Value Date   WBC 8.8 12/24/2016   HGB 13.7 12/24/2016   HCT 41.3 12/24/2016   MCV 90.6 12/24/2016   PLT 244 12/24/2016   NEUTROABS 7.2 12/24/2016    ASSESSMENT & PLAN:  Cancer of right breast, stage 0 Right breast DCIS status post lumpectomy 01/26/2012 followed by adjuvant radiation and currently on tamoxifen since 05/08/2012, ER 100%, PR 100%, 1 cm DCIS, low-grade  Tamoxifen toxicities: 1. Mild hot flashes which have since resolved 2. Mild myalgias 3. Uterine hyperplasia: Patient follows GYN. Patient completed 5 years of tamoxifen therapy and does not need any extended adjuvant therapy because of DCIS.  Patient will stop this December 2018.  Breast Cancer Surveillance: 1. Breast exam 04/18/2016:  Normal 2. Mammogram 10/27/15 No abnormalities. Postsurgical changes. Breast Density Category B. I recommended that she get 3-D mammograms for surveillance. Discussed the differences between different breast density categories.  I offered the patient follow-up with survivorship clinic.  Patient preferred to follow with her primary care physician and her GYN.  She will call us if she has any problems or concerns with her breasts.   I spent 25 minutes talking to the patient of which more than half was spent in counseling and coordination of care.  No orders of the defined types were placed in this encounter.  The patient has a good understanding of the overall plan. she agrees with it. she will call with any problems that may develop before the next visit here.   Rulon Eisenmenger, MD 04/18/17

## 2017-04-18 NOTE — Assessment & Plan Note (Signed)
Right breast DCIS status post lumpectomy 01/26/2012 followed by adjuvant radiation and currently on tamoxifen since 05/08/2012, ER 100%, PR 100%, 1 cm DCIS, low-grade  Tamoxifen toxicities: 1. Mild hot flashes which have since resolved 2. Mild myalgias 3. Uterine hyperplasia: Patient follows GYN. Patient completed 5 years of tamoxifen therapy and does not need any extended adjuvant therapy because of DCIS.  Patient will stop this December 2018.  Breast Cancer Surveillance: 1. Breast exam 04/18/2016: Normal 2. Mammogram 10/27/15 No abnormalities. Postsurgical changes. Breast Density Category B. I recommended that she get 3-D mammograms for surveillance. Discussed the differences between different breast density categories.  Return to clinic in 1 year for follow-up  with survivorship clinic

## 2017-04-21 ENCOUNTER — Ambulatory Visit: Payer: Medicare Other | Admitting: Family Medicine

## 2017-04-21 ENCOUNTER — Encounter: Payer: Self-pay | Admitting: Family Medicine

## 2017-04-21 VITALS — BP 146/90 | HR 84 | Temp 98.9°F | Ht 58.5 in | Wt 163.2 lb

## 2017-04-21 DIAGNOSIS — J449 Chronic obstructive pulmonary disease, unspecified: Secondary | ICD-10-CM

## 2017-04-21 DIAGNOSIS — J4489 Other specified chronic obstructive pulmonary disease: Secondary | ICD-10-CM

## 2017-04-21 MED ORDER — PREDNISONE 10 MG PO TABS: ORAL_TABLET | ORAL | 0 refills | Status: DC

## 2017-04-21 MED ORDER — DOXYCYCLINE HYCLATE 100 MG PO TABS: 100.0000 mg | ORAL_TABLET | Freq: Two times a day (BID) | ORAL | 0 refills | Status: DC

## 2017-04-21 MED ORDER — METHYLPREDNISOLONE ACETATE 80 MG/ML IJ SUSP
80.0000 mg | Freq: Once | INTRAMUSCULAR | Status: AC
  Administered 2017-04-21: 80 mg via INTRAMUSCULAR

## 2017-04-21 MED ORDER — GUAIFENESIN-CODEINE 100-10 MG/5ML PO SYRP: 10.0000 mL | ORAL_SOLUTION | Freq: Three times a day (TID) | ORAL | 0 refills | Status: DC | PRN

## 2017-04-21 MED ORDER — IPRATROPIUM-ALBUTEROL 0.5-2.5 (3) MG/3ML IN SOLN
3.0000 mL | Freq: Once | RESPIRATORY_TRACT | Status: AC
  Administered 2017-04-21: 3 mL via RESPIRATORY_TRACT

## 2017-04-21 NOTE — Progress Notes (Signed)
   Subjective:    Patient ID: Katie Zimmerman, female    DOB: 12-16-1945, 70 y.o.   MRN: [id number]  HPI URI- pt has hx of 'bronchitis quite a bit'.  Cough has been worsening over the past few days.  Yesterday had post-tussive emesis.  No fevers.  + sick contacts.  No sinus pain/pressure.  + HA last night from coughing.  Cough is wet and productive of yellow sputum.  + SOB.  Currently on Breo (sees Dr Annamaria Boots) and albuterol prn.  Increased use of rescue inhaler.  Pt reports 'it always takes a breathing treatment, steroid shot, antibiotic, and prednisone to get this better'.   Review of Systems For ROS see HPI     Objective:   Physical Exam  Constitutional: She appears well-developed and well-nourished. No distress.  HENT:  Head: Normocephalic and atraumatic.  TMs normal bilaterally Mild nasal congestion Throat w/out erythema, edema, or exudate  Eyes: Conjunctivae and EOM are normal. Pupils are equal, round, and reactive to light.  Neck: Normal range of motion. Neck supple.  Cardiovascular: Normal rate, regular rhythm, normal heart sounds and intact distal pulses.  No murmur heard. Pulmonary/Chest: Effort normal. No respiratory distress. She has no wheezes.  + hacking cough Decreased air movement throughout- improved s/p neb tx  Lymphadenopathy:    She has no cervical adenopathy.  Vitals reviewed.         Assessment & Plan:

## 2017-04-21 NOTE — Assessment & Plan Note (Signed)
Deteriorated w/ current exacerbation.  Air movement and cough improved s/p neb tx.  Depomedrol injxn given in office.  Pred taper to start tomorrow.  Cough syrup as needed.  Doxycycline twice daily.  Reviewed supportive care and red flags that should prompt return.  Pt expressed understanding and is in agreement w/ plan.

## 2017-04-21 NOTE — Patient Instructions (Signed)
Follow up as needed or as scheduled Start the Doxycycline twice daily- take w/ food Start the Prednisone as directed tomorrow w/ breakfast- 3 tabs at the same time x3 days, then 2 tabs at the same time x3 days, and then 1 tab daily Use the Albuterol as needed Call with any questions or concerns Hang in there!!!

## 2017-04-21 NOTE — Addendum Note (Signed)
Addended by: Marian Sorrow on: 04/21/2017 01:27 PM   Modules accepted: Orders

## 2017-04-25 ENCOUNTER — Telehealth: Payer: Self-pay | Admitting: Family Medicine

## 2017-04-25 MED ORDER — GABAPENTIN 300 MG PO CAPS: ORAL_CAPSULE | ORAL | 0 refills | Status: DC

## 2017-04-25 NOTE — Telephone Encounter (Signed)
Refill sent as instructed by Dr. Bedsole. 

## 2017-04-25 NOTE — Telephone Encounter (Signed)
That is correct dose.Faythe Ghee to refill.

## 2017-04-25 NOTE — Telephone Encounter (Signed)
Copied from [facility] #[id number]. Topic: Quick Communication - See Telephone Encounter >> Apr 25, 2017 11:46 AM Arletha Grippe wrote: CRM for notification. See Telephone encounter for:   04/25/17. Pt needs refill on gabapentin (NEURONTIN) 300 MG capsule. She would like to have it called into [facility]. This is a different pharmacy  Cb number is [phone]

## 2017-04-25 NOTE — Telephone Encounter (Signed)
Pt. requesting refill on Gabapentin; noted her follow-up with Dr. Diona Browner is current. Phone call to pt.; advised pt. that will verify the dose with Dr. Diona Browner; Gabapentin 300 mg 2 caps in AM, and 4 caps at bedtime.  Pt. agreed with plan.  Pt. stated that is what is takes to manage the pain from previous shingles.  Pt. agreed with plan.

## 2017-04-28 ENCOUNTER — Encounter: Payer: Medicare Other | Admitting: Physical Therapy

## 2017-06-06 ENCOUNTER — Other Ambulatory Visit: Payer: Self-pay | Admitting: Family Medicine

## 2017-06-06 NOTE — Telephone Encounter (Signed)
Last office visit 02/21/2017.  Last refilled 04/25/2017 for #[id number] with no refills.  Ok to refill?

## 2017-08-11 ENCOUNTER — Other Ambulatory Visit: Payer: Self-pay | Admitting: Family Medicine

## 2017-08-11 NOTE — Telephone Encounter (Signed)
Refill  Request  Neurontin  LOV 02/21/2017  Pharmacy  Requested  [address]  [place]

## 2017-08-11 NOTE — Telephone Encounter (Signed)
Pt called because she is completely out of her gabapentin (NEURONTIN) 300 MG capsule  Pt did not realize because her husband fixes her pill each day and did not tell her.  Also pt would like the dr to consider putting refills on her prescription.  [facility] [address] [place] [phone] (Phone) [phone] (Fax)

## 2017-08-22 ENCOUNTER — Ambulatory Visit (INDEPENDENT_AMBULATORY_CARE_PROVIDER_SITE_OTHER): Payer: Medicare HMO | Admitting: Family Medicine

## 2017-08-22 ENCOUNTER — Encounter: Payer: Self-pay | Admitting: Family Medicine

## 2017-08-22 VITALS — BP 138/82 | HR 82 | Temp 98.1°F | Wt 171.5 lb

## 2017-08-22 DIAGNOSIS — J449 Chronic obstructive pulmonary disease, unspecified: Secondary | ICD-10-CM

## 2017-08-22 DIAGNOSIS — J440 Chronic obstructive pulmonary disease with acute lower respiratory infection: Secondary | ICD-10-CM | POA: Diagnosis not present

## 2017-08-22 DIAGNOSIS — J040 Acute laryngitis: Secondary | ICD-10-CM | POA: Diagnosis not present

## 2017-08-22 DIAGNOSIS — J209 Acute bronchitis, unspecified: Secondary | ICD-10-CM

## 2017-08-22 MED ORDER — BENZONATATE 100 MG PO CAPS: 100.0000 mg | ORAL_CAPSULE | Freq: Three times a day (TID) | ORAL | 0 refills | Status: DC | PRN

## 2017-08-22 MED ORDER — ALBUTEROL SULFATE HFA 108 (90 BASE) MCG/ACT IN AERS: 2.0000 | INHALATION_SPRAY | Freq: Four times a day (QID) | RESPIRATORY_TRACT | 3 refills | Status: DC | PRN

## 2017-08-22 MED ORDER — AZITHROMYCIN 250 MG PO TABS: ORAL_TABLET | ORAL | 0 refills | Status: DC

## 2017-08-22 MED ORDER — PREDNISONE 20 MG PO TABS: ORAL_TABLET | ORAL | 0 refills | Status: DC

## 2017-08-22 NOTE — Progress Notes (Signed)
BP 138/82   Pulse 82   Temp 98.1 F (36.7 C) (Oral)   Wt 171 lb 8 oz (77.8 kg)   SpO2 98%   BMI 35.23 kg/m    CC: cough Subjective:    Patient ID: Katie Zimmerman, female    DOB: 03-13-1946, 72 y.o.   MRN: [id number]  HPI: MAY MANRIQUE is a 72 y.o. female presenting on 08/22/2017 for Cough   Here with husband.   COPD flare 03/2017 - persistent cough since then.  3d h/o worsening productive cough, increased sputum production, increased dyspnea, chest congestion, 1d h/o hoarse voice. Throat feels sore, with PNdrainage. Increased wheezing.   Denies fevers/chills, ear or tooth pain, body aches.   H/o chronic bronchitis on breo elipta - currently taking albuterol only PRN.  No sick contacts at home. Non smoker - smoker at home.  She did receive flu shot this year.   Relevant past medical, surgical, family and social history reviewed and updated as indicated. Interim medical history since our last visit reviewed. Allergies and medications reviewed and updated. Outpatient Medications Prior to Visit  Medication Sig Dispense Refill  . acetaminophen (TYLENOL) 500 MG tablet Take 1,000 mg by mouth every 4 (four) hours as needed for mild pain, moderate pain or headache.     . cholecalciferol (VITAMIN D) 1000 units tablet Take 4,000 Units by mouth daily.    . fluticasone furoate-vilanterol (BREO ELLIPTA) 100-25 MCG/INH AEPB Inhale 1 puff then rinse mouth, once daily 60 each 12  . gabapentin (NEURONTIN) 300 MG capsule TAKE 2 CAPSULES BY MOUTH IN THE MORNING AND 4 AT BEDTIME 240 capsule 0  . promethazine (PHENERGAN) 25 MG tablet Take 1 tablet (25 mg total) by mouth every 6 (six) hours as needed for nausea or vomiting. 20 tablet 0  . albuterol (PROVENTIL HFA;VENTOLIN HFA) 108 (90 Base) MCG/ACT inhaler Inhale 2 puffs into the lungs every 6 (six) hours as needed for wheezing or shortness of breath. 1 Inhaler 0  . doxycycline (VIBRA-TABS) 100 MG tablet Take 1 tablet (100 mg total) by mouth 2 (two)  times daily. 20 tablet 0  . guaiFENesin-codeine (ROBITUSSIN AC) 100-10 MG/5ML syrup Take 10 mLs by mouth 3 (three) times daily as needed for cough. 240 mL 0  . predniSONE (DELTASONE) 10 MG tablet 3 tabs x3 days and then 2 tabs x3 days and then 1 tab x3 days.  Take w/ food. 18 tablet 0   No facility-administered medications prior to visit.      Per HPI unless specifically indicated in ROS section below Review of Systems     Objective:    BP 138/82   Pulse 82   Temp 98.1 F (36.7 C) (Oral)   Wt 171 lb 8 oz (77.8 kg)   SpO2 98%   BMI 35.23 kg/m   Wt Readings from Last 3 Encounters:  08/22/17 171 lb 8 oz (77.8 kg)  04/21/17 163 lb 4 oz (74 kg)  04/18/17 164 lb 6.4 oz (74.6 kg)    Physical Exam  Constitutional: She appears well-developed and well-nourished. No distress.  Hoarse voice  HENT:  Head: Normocephalic and atraumatic.  Right Ear: Hearing, tympanic membrane, external ear and ear canal normal.  Left Ear: Hearing, tympanic membrane, external ear and ear canal normal.  Nose: Mucosal edema (mild nasal mucosal congestion) present. No rhinorrhea. Right sinus exhibits no maxillary sinus tenderness and no frontal sinus tenderness. Left sinus exhibits no maxillary sinus tenderness and no frontal sinus tenderness.  Mouth/Throat: Uvula is midline, oropharynx is clear and moist and mucous membranes are normal. No oropharyngeal exudate, posterior oropharyngeal edema, posterior oropharyngeal erythema or tonsillar abscesses.  Eyes: Pupils are equal, round, and reactive to light. Conjunctivae and EOM are normal. No scleral icterus.  Neck: Normal range of motion. Neck supple.  Cardiovascular: Normal rate, regular rhythm, normal heart sounds and intact distal pulses.  No murmur heard. Pulmonary/Chest: Effort normal. No respiratory distress. She has decreased breath sounds. She has no wheezes. She has no rhonchi. She has no rales.  Coarse breath sounds bilaterally but no wheezing    Lymphadenopathy:    She has no cervical adenopathy.  Skin: Skin is warm and dry. No rash noted.  Nursing note and vitals reviewed.     Assessment & Plan:   Problem List Items Addressed This Visit    Acute bronchitis with COPD ([place]) - Primary    Acute exacerbation of chronic bronchitis - treat with zpack, tessalon perls, scheduled albuterol, WASP for prednisone taper provided with indications when to fill.  Discussed supportive care for laryngitis as well.       Relevant Medications   benzonatate (TESSALON) 100 MG capsule   azithromycin (ZITHROMAX) 250 MG tablet   albuterol (PROVENTIL HFA;VENTOLIN HFA) 108 (90 Base) MCG/ACT inhaler   predniSONE (DELTASONE) 20 MG tablet   COPD (chronic obstructive pulmonary disease) with chronic bronchitis (H[facility])   Relevant Medications   benzonatate (TESSALON) 100 MG capsule   azithromycin (ZITHROMAX) 250 MG tablet   albuterol (PROVENTIL HFA;VENTOLIN HFA) 108 (90 Base) MCG/ACT inhaler   predniSONE (DELTASONE) 20 MG tablet    Other Visit Diagnoses    Laryngitis, acute           Meds ordered this encounter  Medications  . benzonatate (TESSALON) 100 MG capsule    Sig: Take 1 capsule (100 mg total) by mouth 3 (three) times daily as needed for cough.    Dispense:  30 capsule    Refill:  0  . azithromycin (ZITHROMAX) 250 MG tablet    Sig: Take two tablets on day one followed by one tablet on days 2-5    Dispense:  6 each    Refill:  0  . albuterol (PROVENTIL HFA;VENTOLIN HFA) 108 (90 Base) MCG/ACT inhaler    Sig: Inhale 2 puffs into the lungs every 6 (six) hours as needed for wheezing or shortness of breath.    Dispense:  1 Inhaler    Refill:  3  . predniSONE (DELTASONE) 20 MG tablet    Sig: Take two tablets daily for 3 days followed by one tablet daily for 4 days    Dispense:  10 tablet    Refill:  0   No orders of the defined types were placed in this encounter.   Follow up plan: No follow-ups on file.  Ria Bush, MD

## 2017-08-22 NOTE — Assessment & Plan Note (Signed)
Acute exacerbation of chronic bronchitis - treat with zpack, tessalon perls, scheduled albuterol, WASP for prednisone taper provided with indications when to fill.  Discussed supportive care for laryngitis as well.

## 2017-08-22 NOTE — Patient Instructions (Addendum)
I do think you have chronic bronchitis flare - treat with zpack, tessalon perls, albuterol inhaler scheduled for next few days.  If not improving with treatment, fill prednisone taper (printed out today).  For hoarse voice - voice rest, suck on hard candy, drink plenty of fluids and rest.  Let us know if fever >101, worsening productive cough or not improving with treatment.

## 2017-09-19 ENCOUNTER — Other Ambulatory Visit: Payer: Self-pay | Admitting: Family Medicine

## 2017-09-19 NOTE — Telephone Encounter (Signed)
Last office visit 08/22/2017 with Dr. Darnell Level for bronchitis.  Last refilled 08/11/2017 for #240 with no refills.  Ok to refill?

## 2017-10-03 DIAGNOSIS — H2513 Age-related nuclear cataract, bilateral: Secondary | ICD-10-CM | POA: Diagnosis not present

## 2017-10-03 DIAGNOSIS — H524 Presbyopia: Secondary | ICD-10-CM | POA: Diagnosis not present

## 2017-10-03 DIAGNOSIS — H25013 Cortical age-related cataract, bilateral: Secondary | ICD-10-CM | POA: Diagnosis not present

## 2017-10-03 DIAGNOSIS — H40033 Anatomical narrow angle, bilateral: Secondary | ICD-10-CM | POA: Diagnosis not present

## 2017-10-17 ENCOUNTER — Ambulatory Visit (INDEPENDENT_AMBULATORY_CARE_PROVIDER_SITE_OTHER)
Admission: RE | Admit: 2017-10-17 | Discharge: 2017-10-17 | Disposition: A | Discharge status: Home / Self Care | Payer: Medicare HMO | Source: Ambulatory Visit | Attending: Family Medicine | Admitting: Family Medicine

## 2017-10-17 ENCOUNTER — Encounter: Payer: Self-pay | Admitting: Family Medicine

## 2017-10-17 ENCOUNTER — Ambulatory Visit (INDEPENDENT_AMBULATORY_CARE_PROVIDER_SITE_OTHER): Payer: Medicare HMO | Admitting: Family Medicine

## 2017-10-17 VITALS — BP 138/86 | HR 83 | Temp 98.9°F | Ht 58.5 in | Wt 174.5 lb

## 2017-10-17 DIAGNOSIS — J449 Chronic obstructive pulmonary disease, unspecified: Secondary | ICD-10-CM | POA: Diagnosis not present

## 2017-10-17 DIAGNOSIS — R053 Chronic cough: Secondary | ICD-10-CM

## 2017-10-17 DIAGNOSIS — K219 Gastro-esophageal reflux disease without esophagitis: Secondary | ICD-10-CM

## 2017-10-17 DIAGNOSIS — R05 Cough: Secondary | ICD-10-CM

## 2017-10-17 DIAGNOSIS — J4489 Other specified chronic obstructive pulmonary disease: Secondary | ICD-10-CM

## 2017-10-17 MED ORDER — BUDESONIDE-FORMOTEROL FUMARATE 160-4.5 MCG/ACT IN AERO: 2.0000 | INHALATION_SPRAY | Freq: Two times a day (BID) | RESPIRATORY_TRACT | 3 refills | Status: DC

## 2017-10-17 MED ORDER — PREDNISONE 20 MG PO TABS
ORAL_TABLET | ORAL | 0 refills | Status: DC
Start: 2017-10-17 — End: 2017-10-27

## 2017-10-17 NOTE — Assessment & Plan Note (Addendum)
>   6 months of cough in pt with heavy second hand smoke x 20 years.  Eval with CXR  and spirometry.  Treat possible GERD and COPD as cause.   Spirometry showed restriction, but effort was difficult given coughing.

## 2017-10-17 NOTE — Progress Notes (Signed)
Subjective:    Patient ID: Katie Zimmerman, female    DOB: 1946/03/17, 72 y.o.   MRN: [id number]  HPI   72 year old female presents for continued cough... Has had cough off and on since 03/2018. Fluctuating cough   She was seen on 3/26 for acute bronchitis with COPD  By D.r G.  She was treated with azithromycin, pred taper and benzonatate  Today she reports that after the above treatment  Cough improved some but never went away complete.   Has flared up off off and on in last 2 months. Productive cough,  yellow greenish off and on.  No blood in mucus.  no recent fever measured last night felt hot and she was sweaty.  no ST, no ear pain, some tightness in head, minimal congestion. No sneeze. SOb at rest, increased with walking. Hears self wheeze.  Using albuterol 2 times daily  As needed.  Slight increase in heart burn. Using ranitidine as needed. Occurs at night. Using 1-2 times a month.   No longer using breo ellipta ( felt it did not help), albuterol prn. Nonsmoker, but smoker at home.  Blood pressure 138/86, pulse 83, temperature 98.9 F (37.2 C), temperature source Oral, height 4' 10.5" (1.486 m), weight 174 lb 8 oz (79.2 kg), SpO2 95 %.  Review of Systems  Constitutional: Negative for fatigue and fever.  HENT: Negative for congestion.   Eyes: Negative for pain.  Respiratory: Positive for cough, shortness of breath and wheezing.   Cardiovascular: Negative for chest pain, palpitations and leg swelling.  Gastrointestinal: Negative for abdominal pain.  Genitourinary: Negative for dysuria and vaginal bleeding.  Musculoskeletal: Negative for back pain.  Neurological: Negative for syncope, light-headedness and headaches.  Psychiatric/Behavioral: Negative for dysphoric mood.       Objective:   Physical Exam  Constitutional: Vital signs are normal. She appears well-developed and well-nourished. She is cooperative.  Non-toxic appearance. She does not appear ill. No distress.    Obesity central.  HENT:  Head: Normocephalic.  Right Ear: Hearing, tympanic membrane, external ear and ear canal normal. Tympanic membrane is not erythematous, not retracted and not bulging.  Left Ear: Hearing, tympanic membrane, external ear and ear canal normal. Tympanic membrane is not erythematous, not retracted and not bulging.  Nose: No mucosal edema or rhinorrhea. Right sinus exhibits no maxillary sinus tenderness and no frontal sinus tenderness. Left sinus exhibits no maxillary sinus tenderness and no frontal sinus tenderness.  Mouth/Throat: Uvula is midline, oropharynx is clear and moist and mucous membranes are normal.  Eyes: Pupils are equal, round, and reactive to light. Conjunctivae, EOM and lids are normal. Lids are everted and swept, no foreign bodies found.  Neck: Trachea normal and normal range of motion. Neck supple. Carotid bruit is not present. No thyroid mass and no thyromegaly present.  Cardiovascular: Normal rate, regular rhythm, S1 normal, S2 normal, normal heart sounds, intact distal pulses and normal pulses. Exam reveals no gallop and no friction rub.  No murmur heard. Pulmonary/Chest: Effort normal and breath sounds normal. No tachypnea. No respiratory distress. She has no decreased breath sounds. She has no wheezes. She has no rhonchi. She has no rales.  Abdominal: Soft. Normal appearance and bowel sounds are normal. There is no tenderness.  Neurological: She is alert.  Skin: Skin is warm, dry and intact. No rash noted.  Psychiatric: Her speech is normal and behavior is normal. Judgment and thought content normal. Her mood appears not anxious. Cognition and memory  are normal. She does not exhibit a depressed mood.          Assessment & Plan:

## 2017-10-17 NOTE — Patient Instructions (Addendum)
Start symbicort now.  Complete prednisone taper.  Use albuterol as needed.  We will call with the X-ray results. Start omeprazole 20 mg for possible reflux X 2-4 weeks if cough not totally under control with above treatments.

## 2017-10-19 ENCOUNTER — Telehealth: Payer: Self-pay | Admitting: Family Medicine

## 2017-10-19 NOTE — Telephone Encounter (Signed)
Copied from [place] [id number] [id number]. Topic: General - Other >> Oct 19, 2017  2:26 PM Yvette Rack wrote: Reason for CRM: pt calling about xray results

## 2017-10-19 NOTE — Telephone Encounter (Signed)
Patient notified

## 2017-10-20 ENCOUNTER — Other Ambulatory Visit: Payer: Self-pay | Admitting: Family Medicine

## 2017-10-20 DIAGNOSIS — R0609 Other forms of dyspnea: Principal | ICD-10-CM

## 2017-10-27 ENCOUNTER — Encounter: Payer: Self-pay | Admitting: Family

## 2017-10-27 ENCOUNTER — Ambulatory Visit (INDEPENDENT_AMBULATORY_CARE_PROVIDER_SITE_OTHER): Payer: Medicare HMO | Admitting: Family

## 2017-10-27 VITALS — BP 148/98 | HR 88 | Temp 98.3°F | Ht 58.5 in | Wt 168.1 lb

## 2017-10-27 DIAGNOSIS — J440 Chronic obstructive pulmonary disease with acute lower respiratory infection: Secondary | ICD-10-CM

## 2017-10-27 DIAGNOSIS — J209 Acute bronchitis, unspecified: Secondary | ICD-10-CM

## 2017-10-27 MED ORDER — PREDNISONE 20 MG PO TABS: ORAL_TABLET | ORAL | 0 refills | Status: DC

## 2017-10-27 MED ORDER — IPRATROPIUM-ALBUTEROL 0.5-2.5 (3) MG/3ML IN SOLN
3.0000 mL | Freq: Once | RESPIRATORY_TRACT | Status: AC
  Administered 2017-10-27: 3 mL via RESPIRATORY_TRACT

## 2017-10-27 MED ORDER — DOXYCYCLINE HYCLATE 100 MG PO TABS: 100.0000 mg | ORAL_TABLET | Freq: Two times a day (BID) | ORAL | 0 refills | Status: DC

## 2017-10-27 NOTE — Progress Notes (Signed)
Katie Zimmerman is a 72 y.o. female with the following history as recorded in [employer]:  Patient Active Problem List   Diagnosis Date Noted  . Cough, persistent 10/17/2017  . Headache 02/21/2017  . BMI 33.0-33.9,adult 08/09/2016  . Increased endometrial stripe thickness 03/18/2016  . [place] arthritis 01/22/2015  . Counseling regarding end of life decision making 01/22/2015  . Osteoporosis 01/28/2014  . Hearing loss in right ear 01/20/2014  . Acute bronchitis with COPD ([place]) 12/24/2012  . Personal history of colonic polyps-adenomas 05/25/2012  . GERD (gastroesophageal reflux disease)   . History of radiation therapy   . Cancer of right breast, stage 0 01/26/2012  . Prediabetes 11/22/2011  . High cholesterol 11/22/2011  . TEMPOROMANDIBULAR JOINT PAIN 11/04/2009  . COPD (chronic obstructive pulmonary disease) with chronic bronchitis ([place]) 05/04/2009  . RENAL CALCULUS 05/04/2009  . Post herpetic neuralgia 04/14/2009    Current Outpatient Medications  Medication Sig Dispense Refill  . acetaminophen (TYLENOL) 500 MG tablet Take 1,000 mg by mouth every 4 (four) hours as needed for mild pain, moderate pain or headache.     . albuterol (PROVENTIL HFA;VENTOLIN HFA) 108 (90 Base) MCG/ACT inhaler Inhale 2 puffs into the lungs every 6 (six) hours as needed for wheezing or shortness of breath. 1 Inhaler 3  . benzonatate (TESSALON) 100 MG capsule Take 1 capsule (100 mg total) by mouth 3 (three) times daily as needed for cough. 30 capsule 0  . cholecalciferol (VITAMIN D) 1000 units tablet Take 4,000 Units by mouth daily.    Marland Kitchen gabapentin (NEURONTIN) 300 MG capsule TAKE 2 CAPSULES BY MOUTH IN THE MORNING AND 4 AT BEDTIME 240 capsule 0  . promethazine (PHENERGAN) 25 MG tablet Take 1 tablet (25 mg total) by mouth every 6 (six) hours as needed for nausea or vomiting. 20 tablet 0  . budesonide-formoterol (SYMBICORT) 160-4.5 MCG/ACT inhaler Inhale 2 puffs into the lungs 2 (two) times daily. (Patient not taking:  Reported on 10/27/2017) 1 Inhaler 3  . doxycycline (VIBRA-TABS) 100 MG tablet Take 1 tablet (100 mg total) by mouth 2 (two) times daily. 20 tablet 0  . predniSONE (DELTASONE) 20 MG tablet 3 tabs by mouth daily x 3 days, then 2 tabs by mouth daily x 2 days then 1 tab by mouth daily x 2 days 15 tablet 0   No current facility-administered medications for this visit.     Allergies: Penicillins; Venlafaxine; and Sulfonamide derivatives  Past Medical History:  Diagnosis Date  . Breast cancer ([place]) 12/2011  . Breast cancer ([place]) 12/2011   DCIS  . Complication of anesthesia    hard to wake up age 76-not since  . Fibromyalgia   . GERD (gastroesophageal reflux disease)   . History of radiation therapy 02/29/12 -04/21/12   right breast, 5040 cGy in 28 fx, boost to total 6240 cGy  . Osteoporosis   . Personal history of colonic polyps-adenomas 05/25/2012  . Personal history of radiation therapy   . PHN (postherpetic neuralgia) 2000   Right side of face    Past Surgical History:  Procedure Laterality Date  . APPENDECTOMY    . BIOPSY BREAST  01/10/12   Right breast needle cor biopsy  . BREAST BIOPSY  01/10/2012  . BREAST CYST ASPIRATION  01/10/2012  . BREAST LUMPECTOMY Right 01/26/2012   Right/ERPR+ DCIS, right ax snbx  . FOOT SURGERY  1996   rt/lt great toes  . GLAUCOMA SURGERY  1993   laser  . OVARIAN CYST SURGERY  [facility]    Family History  Problem Relation Age of Onset  . Hypertension Mother   . Cancer Mother        LIVER cancer  . Hypertension Father   . Diabetes Father   . Heart failure Father   . Breast cancer Sister 53       NOT hormone receptor breast cancer  . Breast cancer Maternal Aunt 75  . Breast cancer Other        dx in her 66s  . Breast cancer Cousin 53       maternal cousin  . Breast cancer Cousin        maternal cousin  . Breast cancer Cousin        maternal cousin  . Cancer Cousin        unknown form of cancer; maternal cousin  . Cancer  Cousin        unknown form of cancer; maternal cousin  . Cancer Cousin 11       ? lung cancer; maternal cousin  . Breast cancer Cousin        dx 23s-50s; paternal cousin  . Breast cancer Cousin        dx in her 15s; paternal cousin  . Colon cancer Neg Hx   . Stomach cancer Neg Hx     Social History   Tobacco Use  . Smoking status: Never Smoker  . Smokeless tobacco: Never Used  Substance Use Topics  . Alcohol use: Yes    Comment: occasional glass of wine    Subjective:  Patient presents today with concerns for persisting cough. Saw her PCP with similar concerns on 10/17/2017 and was prescribed Symbicort and Prednisone- could not afford Symbicort and she chose not to start the Prednisone; now feels like congestion is worse in her head and chest today; Feels like she needs an antibiotic today; has been under the care of pulmonology in the past- has seen Dr. Annamaria Boots in the past;       Objective:  Vitals:   10/27/17 1557  BP: (!) 148/98  Pulse: 88  Temp: 98.3 F (36.8 C)  TempSrc: Oral  SpO2: 95%  Weight: 168 lb 1.3 oz (76.2 kg)  Height: 4' 10.5" (1.486 m)    General: Well developed, well nourished, in no acute distress  Skin : Warm and dry.  Head: Normocephalic and atraumatic  Eyes: Sclera and conjunctiva clear; pupils round and reactive to light; extraocular movements intact  Ears: External normal; canals clear; tympanic membranes normal  Oropharynx: Pink, supple. No suspicious lesions  Neck: Supple without thyromegaly, adenopathy  Lungs: Respirations unlabored; clear to auscultation bilaterally without wheeze, rales, rhonchi  CVS exam: normal rate and regular rhythm.  Abdomen: Soft; nontender; nondistended; normoactive bowel sounds; no masses or hepatosplenomegaly  Musculoskeletal: No deformities; no active joint inflammation  Extremities: No edema, cyanosis, clubbing  Vessels: Symmetric bilaterally  Neurologic: Alert and oriented; speech intact; face symmetrical; moves  all extremities well; CNII-XII intact without focal deficit  Assessment:  1. Acute bronchitis with COPD ([place])     Plan:  Duo-neb given in office with good relief of symptoms;  Rx for Doxycycline 100 mg bid x 10 days and prednisone taper; continue to use albuterol inhaler q 4-6 hours; she is given a sample of BREO 200 as she cannot afford Symbicort previously prescribed; increase fluids, rest; will refer back to her pulmonologist and her PCP;   No follow-ups on file.  Orders Placed This Encounter  Procedures  . Ambulatory referral to Pulmonology    Referral Priority:   Routine    Referral Type:   Consultation    Referral Reason:   Specialty Services Required    Referred to Provider:   Deneise Lever, MD    Requested Specialty:   Pulmonary Disease    Number of Visits Requested:   1    Requested Prescriptions   Signed Prescriptions Disp Refills  . doxycycline (VIBRA-TABS) 100 MG tablet 20 tablet 0    Sig: Take 1 tablet (100 mg total) by mouth 2 (two) times daily.  . predniSONE (DELTASONE) 20 MG tablet 15 tablet 0    Sig: 3 tabs by mouth daily x 3 days, then 2 tabs by mouth daily x 2 days then 1 tab by mouth daily x 2 days

## 2017-10-30 ENCOUNTER — Telehealth: Payer: Self-pay | Admitting: *Deleted

## 2017-10-30 MED ORDER — OPTICHAMBER DIAMOND DEVI: 0 refills | Status: DC

## 2017-10-30 NOTE — Telephone Encounter (Signed)
Received fax from [facility] stating patient is having difficulties with inhaler.  Coordination, tasting medication, need more time to inhale, misdirection of spray and/or failure to exhale before use.  May benefit from spacer.  Rx for spacer sent to [facility] as requested.

## 2017-10-31 ENCOUNTER — Other Ambulatory Visit (HOSPITAL_COMMUNITY): Payer: Medicare HMO

## 2017-10-31 NOTE — Telephone Encounter (Signed)
Agreed -

## 2017-11-08 ENCOUNTER — Other Ambulatory Visit: Payer: Self-pay | Admitting: Family Medicine

## 2017-11-09 ENCOUNTER — Ambulatory Visit (HOSPITAL_COMMUNITY): Payer: Medicare HMO | Attending: Cardiology

## 2017-11-09 ENCOUNTER — Other Ambulatory Visit: Payer: Self-pay

## 2017-11-09 DIAGNOSIS — I081 Rheumatic disorders of both mitral and tricuspid valves: Secondary | ICD-10-CM | POA: Insufficient documentation

## 2017-11-09 DIAGNOSIS — I5030 Unspecified diastolic (congestive) heart failure: Secondary | ICD-10-CM | POA: Diagnosis not present

## 2017-11-09 DIAGNOSIS — R0609 Other forms of dyspnea: Secondary | ICD-10-CM | POA: Diagnosis not present

## 2017-11-09 NOTE — Telephone Encounter (Signed)
Last office visit 10/17/2017.  Last refilled 09/19/2017 for #240 with no refills.  Ok to refill?

## 2017-11-17 ENCOUNTER — Ambulatory Visit: Payer: Medicare HMO | Admitting: Internal Medicine

## 2017-11-17 ENCOUNTER — Encounter: Payer: Self-pay | Admitting: Internal Medicine

## 2017-11-17 ENCOUNTER — Ambulatory Visit (INDEPENDENT_AMBULATORY_CARE_PROVIDER_SITE_OTHER)
Admission: RE | Admit: 2017-11-17 | Discharge: 2017-11-17 | Disposition: A | Discharge status: Home / Self Care | Payer: Medicare HMO | Source: Ambulatory Visit | Attending: Internal Medicine | Admitting: Internal Medicine

## 2017-11-17 ENCOUNTER — Other Ambulatory Visit (INDEPENDENT_AMBULATORY_CARE_PROVIDER_SITE_OTHER): Payer: Medicare HMO

## 2017-11-17 VITALS — BP 124/70 | HR 73 | Ht 58.5 in | Wt 171.6 lb

## 2017-11-17 DIAGNOSIS — J42 Unspecified chronic bronchitis: Secondary | ICD-10-CM | POA: Diagnosis not present

## 2017-11-17 DIAGNOSIS — K219 Gastro-esophageal reflux disease without esophagitis: Secondary | ICD-10-CM | POA: Diagnosis not present

## 2017-11-17 DIAGNOSIS — J449 Chronic obstructive pulmonary disease, unspecified: Secondary | ICD-10-CM

## 2017-11-17 DIAGNOSIS — R05 Cough: Secondary | ICD-10-CM | POA: Diagnosis not present

## 2017-11-17 LAB — CBC WITH DIFFERENTIAL/PLATELET
Basophils Absolute: 0.1 10*3/uL (ref 0.0–0.1)
Basophils Relative: 1.1 % (ref 0.0–3.0)
Eosinophils Absolute: 0.5 10*3/uL (ref 0.0–0.7)
Eosinophils Relative: 4.4 % (ref 0.0–5.0)
HCT: 39.9 % (ref 36.0–46.0)
Hemoglobin: 13.5 g/dL (ref 12.0–15.0)
Lymphocytes Relative: 23.3 % (ref 12.0–46.0)
Lymphs Abs: 2.5 10*3/uL (ref 0.7–4.0)
MCHC: 33.7 g/dL (ref 30.0–36.0)
MCV: 89.2 fl (ref 78.0–100.0)
Monocytes Absolute: 0.7 10*3/uL (ref 0.1–1.0)
Monocytes Relative: 6.7 % (ref 3.0–12.0)
Neutro Abs: 6.8 10*3/uL (ref 1.4–7.7)
Neutrophils Relative %: 64.5 % (ref 43.0–77.0)
Platelets: 284 10*3/uL (ref 150.0–400.0)
RBC: 4.48 Mil/uL (ref 3.87–5.11)
RDW: 13.6 % (ref 11.5–15.5)
WBC: 10.6 10*3/uL — ABNORMAL HIGH (ref 4.0–10.5)

## 2017-11-17 LAB — BASIC METABOLIC PANEL
BUN: 11 mg/dL (ref 6–23)
CO2: 25 mEq/L (ref 19–32)
Calcium: 9.6 mg/dL (ref 8.4–10.5)
Chloride: 106 mEq/L (ref 96–112)
Creatinine, Ser: 0.84 mg/dL (ref 0.40–1.20)
GFR: 70.86 mL/min (ref >60.00)
Glucose, Bld: 124 mg/dL — ABNORMAL HIGH (ref 70–99)
Potassium: 4 mEq/L (ref 3.5–5.1)
Sodium: 141 mEq/L (ref 135–145)

## 2017-11-17 LAB — BRAIN NATRIURETIC PEPTIDE: Pro B Natriuretic peptide (BNP): 69 pg/mL (ref 0.0–100.0)

## 2017-11-17 MED ORDER — FLUTICASONE-UMECLIDIN-VILANT 100-62.5-25 MCG/INH IN AEPB: 1.0000 | INHALATION_SPRAY | Freq: Every day | RESPIRATORY_TRACT | 0 refills | Status: DC

## 2017-11-17 NOTE — Progress Notes (Signed)
-HPI  female never smoker(prolonged second hand smoke) followed for chronic bronchitis, complicated by right breast cancer/XRT, GERD Office Spirometry 10/16/2017-moderate restriction of exhaled volume without obvious obstruction.  FVC 1.57/68%, FEV1 1.27/73%, ratio 0.81, FEF 25-75% 1.37/88% -------------------------------------------------------------- 41/71//5769-72 year old female never smoker(prolonged second hand smoke) followed for chronic bronchitis, complicated by right breast cancer/XRT, GERD ACUTE VISIT: Cough-productive-yellow in color since Sunday morning; wheezing, SOB, Chest tightness. Pt denies any fever or chills. Woke with cough 3 days ago. Husband also sick. No fever but did have sore throat. CXR 08/17/2015- NAD, mild CE  11/17/2017- 72 year old female never smoker(prolonged second hand smoke) followed for chronic bronchitis, complicated by right breast cancer/XRT, GERD -----Chronic Bronchitis: Pt told to follow up per Rosann Auerbach since last seen 2017. Pt continues to have bronchitis flares. Pt was given Doxycycline, prednisone taper, and breathing treatments. Pt was also givne Breo but wanted to speak about Symbicort.  Symbicort 160/spacer, Proventil HFA Has noted more coughing over the past year, ER last summer "bronchitis", worse last fall with wheezing.  Got prednisone and antibiotic in March.  Spirometry and CXR in May with no treatment at that visit.  Worse after that.  Never completely clear of cough productive of clear to thick yellow sputum.  Not a lot of shortness of breath.  Occasional night sweat but denies chest pain, palpitation, fever, edema, blood. Occasionally aware of some reflux but feels diet controls this. Office Spirometry 10/16/2017-moderate restriction of exhaled volume obstruction.  FVC 1.57/68%, FEV1 1.27/73%, ratio 0.81, FEF 25-75% 1.37/88% CXR 10/18/2017-  Cardiac shadow is enlarged. Tortuosity of the thoracic aorta is again noted. Mild vascular  congestion is noted without interstitial edema. Mild left basilar atelectasis is seen. No sizable effusion is noted. No bony abnormality is noted. Mild left basilar atelectasis and vascular congestion. Echocardiogram EF 96-22, grade 1 diastolic dysfunction  ROS-see HPI  + = positive Constitutional:   No-   weight loss, night sweats, fevers, chills, fatigue, lassitude. HEENT:   No-  headaches, difficulty swallowing, tooth/dental problems, sore throat,       No-  sneezing, itching, ear ache, nasal congestion, post nasal drip,  CV:  No-   chest pain, orthopnea, PND, swelling in lower extremities, anasarca,                                     dizziness, palpitations Resp: +shortness of breath with exertion or at rest.              +-productive cough,  + non-productive cough,  No- coughing up of blood.            +change in color of mucus.  + wheezing.   Skin: No-   rash or lesions. GI:  No-   heartburn, indigestion, abdominal pain, nausea, vomiting, diarrhea,                 change in bowel habits, loss of appetite GU:  MS:  No-   joint pain or swelling.   Neuro-     nothing unusual Psych:  No- change in mood or affect. No depression or anxiety.  No memory loss.  OBJ- Physical Exam General- Alert, Oriented, Affect-appropriate, Distress- none acute, + obese Skin- rash-none, lesions- none, excoriation- none Lymphadenopathy- none Head- atraumatic            Eyes- Gross vision intact, PERRLA, conjunctivae and secretions clear  Ears- Hearing, canals-normal            Nose- Clear, no-Septal dev, mucus, polyps, erosion, perforation             Throat- Mallampati III , mucosa clear , drainage- none, tonsils- atrophic Neck- flexible , trachea midline, no stridor , thyroid nl, carotid no bruit Chest - symmetrical excursion , unlabored           Heart/CV- RRR , no murmur , no gallop  , no rub, nl s1 s2                           - JVD- none , edema- none, stasis changes- none, varices-  none           Lung- + rhonchi R>L base, wheeze+little, cough+ harsh , dullness-none, rub- none           Chest wall-  +Hx R lumpectomy/XRT Abd-  Br/ Gen/ Rectal- Not done, not indicated Extrem- cyanosis- none, clubbing, none, atrophy- none, strength- nl Neuro- grossly intact to observation

## 2017-11-17 NOTE — Patient Instructions (Signed)
Order- CXR  Dx chronic bronchitis             Lab- CBC w diff, IgE, BNP, BMET   Order- schedule PFT   Dx chronic bronchitis  Sample x 2 Trelegy    Inhale 1 puff, then rinse mouth, once daily    Try this instead of Breo. When the sample runs out, go back to B reo for comparison.

## 2017-11-20 LAB — IGE: IgE (Immunoglobulin E), Serum: 241 kU/L — ABNORMAL HIGH (ref <=114)

## 2017-11-20 NOTE — Assessment & Plan Note (Signed)
Chronic bronchitis syndrome with known secondhand smoke exposure.  There may be an obstructive component not revealed by simple spirometry.  Body habitus favors obesity hypoventilation is a basis for restriction.  CXR had shown cardiac enlargement and suggested borderline fluid overload, which may be an additional reason for cough.  She is also had a history of radiation therapy.  If she fails to clear we may need to consider CT chest. Plan-reassess fluid status with CXR, BNP, BMET. Check for allergic inflammation with CBCdiff, IgE, schedule full PFT. Try Trelegy. Emphasize reflux precautions.

## 2017-11-20 NOTE — Assessment & Plan Note (Signed)
Emphasize reflux precautions. She may need trial of acid blocker if recurrent reflux is contributing to her cough.

## 2017-11-22 ENCOUNTER — Telehealth: Payer: Self-pay | Admitting: Internal Medicine

## 2017-11-22 NOTE — Progress Notes (Signed)
LMTCB

## 2017-11-22 NOTE — Telephone Encounter (Signed)
Notes recorded by Deneise Lever, MD on 11/17/2017 at 3:44 PM EDT CXR- heart remains enlarged, but lungs are clear, without vascular congestion. ------------------------------- Attempted to call pt. I did not receive an answer. I have left a message for pt to return our call.

## 2017-11-23 NOTE — Telephone Encounter (Signed)
Pt is returning call. Cb is [phone].

## 2017-11-23 NOTE — Telephone Encounter (Signed)
Attempted to call patient today regarding results. I did not receive an answer at time of call. I have left a voicemail message for pt to return call. X1  

## 2017-11-24 ENCOUNTER — Telehealth: Payer: Self-pay | Admitting: Internal Medicine

## 2017-11-24 NOTE — Telephone Encounter (Signed)
Called and spoke with patient regarding results.  Informed the patient of results and recommendations today. Pt verbalized understanding and denied any questions or concerns at this time.  Nothing further needed.  

## 2017-12-06 ENCOUNTER — Other Ambulatory Visit: Payer: Self-pay | Admitting: Family Medicine

## 2017-12-06 NOTE — Telephone Encounter (Signed)
Last office visit 10/17/2017 for persistent cough. CPE scheduled for 02/23/2018.  Ok to refill?

## 2017-12-18 ENCOUNTER — Ambulatory Visit: Payer: Medicare HMO | Admitting: Internal Medicine

## 2017-12-18 ENCOUNTER — Ambulatory Visit (INDEPENDENT_AMBULATORY_CARE_PROVIDER_SITE_OTHER): Payer: Medicare HMO | Admitting: Internal Medicine

## 2017-12-18 ENCOUNTER — Encounter: Payer: Self-pay | Admitting: Internal Medicine

## 2017-12-18 DIAGNOSIS — K219 Gastro-esophageal reflux disease without esophagitis: Secondary | ICD-10-CM

## 2017-12-18 DIAGNOSIS — J449 Chronic obstructive pulmonary disease, unspecified: Secondary | ICD-10-CM

## 2017-12-18 DIAGNOSIS — J42 Unspecified chronic bronchitis: Secondary | ICD-10-CM | POA: Diagnosis not present

## 2017-12-18 LAB — PULMONARY FUNCTION TEST
DL/VA % pred: 108 %
DL/VA: 4.51 ml/min/mmHg/L
DLCO cor % pred: 74 %
DLCO cor: 13.48 ml/min/mmHg
DLCO unc % pred: 74 %
DLCO unc: 13.52 ml/min/mmHg
FEF 25-75 Post: 1.84 L/sec
FEF 25-75 Pre: 1.7 L/sec
FEF2575-%Change-Post: 8 %
FEF2575-%Pred-Post: 118 %
FEF2575-%Pred-Pre: 108 %
FEV1-%Change-Post: 1 %
FEV1-%Pred-Post: 85 %
FEV1-%Pred-Pre: 83 %
FEV1-Post: 1.53 L
FEV1-Pre: 1.5 L
FEV1FVC-%Change-Post: 6 %
FEV1FVC-%Pred-Pre: 109 %
FEV6-%Change-Post: -4 %
FEV6-%Pred-Post: 75 %
FEV6-%Pred-Pre: 78 %
FEV6-Post: 1.71 L
FEV6-Pre: 1.8 L
FEV6FVC-%Pred-Post: 105 %
FEV6FVC-%Pred-Pre: 105 %
FVC-%Change-Post: -4 %
FVC-%Pred-Post: 71 %
FVC-%Pred-Pre: 75 %
FVC-Post: 1.72 L
FVC-Pre: 1.8 L
Post FEV1/FVC ratio: 89 %
Post FEV6/FVC ratio: 100 %
Pre FEV1/FVC ratio: 83 %
Pre FEV6/FVC Ratio: 100 %
RV % pred: 59 %
RV: 1.19 L
TLC % pred: 69 %
TLC: 3.06 L

## 2017-12-18 MED ORDER — FLUTICASONE-UMECLIDIN-VILANT 100-62.5-25 MCG/INH IN AEPB: 1.0000 | INHALATION_SPRAY | Freq: Every day | RESPIRATORY_TRACT | 12 refills | Status: DC

## 2017-12-18 NOTE — Progress Notes (Signed)
PFT done today. 

## 2017-12-18 NOTE — Progress Notes (Signed)
-HPI  female never smoker(prolonged second hand smoke) followed for chronic bronchitis, complicated by right breast cancer/XRT, GERD Office Spirometry 10/16/2017-moderate restriction of exhaled volume without obvious obstruction.  FVC 1.57/68%, FEV1 1.27/73%, ratio 0.81, FEF 25-75% 1.37/88% Echocardiogram EF 14-97, grade 1 diastolic dysfunction PFT 0/26/3785-YIFO restriction, minimal diffusion defect.  FVC 1.72/71%, FEV1 1.53/85%, ratio 0.89, TLC 69%, DLCO 74% --------------------------------------------------------------  11/17/2017- 72 year old female never smoker(prolonged second hand smoke) followed for chronic bronchitis, complicated by right breast cancer/XRT, GERD -----Chronic Bronchitis: Pt told to follow up per Rosann Auerbach since last seen 2017. Pt continues to have bronchitis flares. Pt was given Doxycycline, prednisone taper, and breathing treatments. Pt was also givne Breo but wanted to speak about Symbicort.  Symbicort 160/spacer, Proventil HFA Has noted more coughing over the past year, ER last summer "bronchitis", worse last fall with wheezing.  Got prednisone and antibiotic in March.  Spirometry and CXR in May with no treatment at that visit.  Worse after that.  Never completely clear of cough productive of clear to thick yellow sputum.  Not a lot of shortness of breath.  Occasional night sweat but denies chest pain, palpitation, fever, edema, blood. Occasionally aware of some reflux but feels diet controls this. Office Spirometry 10/16/2017-moderate restriction of exhaled volume obstruction.  FVC 1.57/68%, FEV1 1.27/73%, ratio 0.81, FEF 25-75% 1.37/88% CXR 10/18/2017-  Cardiac shadow is enlarged. Tortuosity of the thoracic aorta is again noted. Mild vascular congestion is noted without interstitial edema. Mild left basilar atelectasis is seen. No sizable effusion is noted. No bony abnormality is noted. Mild left basilar atelectasis and vascular congestion. Echocardiogram EF  27-74, grade 1 diastolic dysfunction  06/26/7865- 72 year old female never smoker(prolonged second hand smoke) followed for chronic bronchitis, complicated by right breast cancer/XRT, GERD -----Chronic Bronchitis: Pt completed PFT today. Pt feels that Trelegy is working well. Slight cough-productive at times but not as bad as it has been in past.  Feels Trelegy works significantly better than Breo for her.  Little reflux-does not wake choking. Reviewed chest x-rays showing persistent cardiac enlargement. PFT results reviewed with her. CXR 11/17/2017- IMPRESSION: 1.  No acute pulmonary disease. 2.  Cardiomegaly. PFT 12/18/2017-mild restriction, minimal diffusion defect.  FVC 1.72/71%, FEV1 1.53/85%, ratio 0.89, TLC 69%, DLCO 74%  ROS-see HPI  + = positive Constitutional:   No-   weight loss, night sweats, fevers, chills, fatigue, lassitude. HEENT:   No-  headaches, difficulty swallowing, tooth/dental problems, sore throat,       No-  sneezing, itching, ear ache, nasal congestion, post nasal drip,  CV:  No-   chest pain, orthopnea, PND, swelling in lower extremities, anasarca,                                     dizziness, palpitations Resp: +shortness of breath with exertion or at rest.             -productive cough,  + non-productive cough,  No- coughing up of blood.            change in color of mucus.   wheezing.   Skin: No-   rash or lesions. GI:  No-   heartburn, indigestion, abdominal pain, nausea, vomiting, diarrhea,                 change in bowel habits, loss of appetite GU:  MS:  No-   joint pain or swelling.   Neuro-  nothing unusual Psych:  No- change in mood or affect. No depression or anxiety.  No memory loss.  OBJ- Physical Exam General- Alert, Oriented, Affect-appropriate, Distress- none acute, + obese Skin- rash-none, lesions- none, excoriation- none Lymphadenopathy- none Head- atraumatic            Eyes- Gross vision intact, PERRLA, conjunctivae and secretions  clear            Ears- Hearing, canals-normal            Nose- Clear, no-Septal dev, mucus, polyps, erosion, perforation             Throat- Mallampati III , mucosa clear , drainage- none, tonsils- atrophic Neck- flexible , trachea midline, no stridor , thyroid nl, carotid no bruit Chest - symmetrical excursion , unlabored           Heart/CV- RRR , no murmur , no gallop  , no rub, nl s1 s2                           - JVD- none , edema- none, stasis changes- none, varices- none           Lung- + clear, wheeze+little, cough+  , dullness-none, rub- none           Chest wall-  +Hx R lumpectomy/XRT Abd-  Br/ Gen/ Rectal- Not done, not indicated Extrem- cyanosis- none, clubbing, none, atrophy- none, strength- nl Neuro- grossly intact to observation

## 2017-12-18 NOTE — Patient Instructions (Addendum)
Prescription for Trelegy with coupon or patient assistance if available.  Please call as needed

## 2017-12-19 ENCOUNTER — Ambulatory Visit: Payer: Medicare HMO | Admitting: Internal Medicine

## 2017-12-20 NOTE — Assessment & Plan Note (Signed)
Mild restriction partly reflects her obesity.  Trelegy is controlling reactive airways component better. Plan-continue for Trelegy-coupons if available.

## 2017-12-20 NOTE — Assessment & Plan Note (Signed)
Now well controlled.  I emphasized potential for reflux to contribute to cough and to lower respiratory problems.  Emphasize reflux precautions.

## 2017-12-21 ENCOUNTER — Telehealth: Payer: Self-pay

## 2017-12-21 NOTE — Telephone Encounter (Signed)
Copied from [place] [id number]. Topic: Referral - Request >> Dec 21, 2017  1:05 PM Katie Zimmerman B wrote: Reason for CRM: pt called to request a referral to Dr. Delice Lesch (neurology) done asap, call pt if needed  >> Dec 21, 2017  1:24 PM Modena Nunnery, CMA wrote: Lm on pts vm requesting a call back to confirm why referral is needed. Advised OV may be required as she has not been seen since 09/2017. >> Dec 21, 2017  2:27 PM Katie Zimmerman B wrote: Pt states that she is having issues w/ her face; states that she cannot eat anything, contact pt to help; pt is aware that Earl Lagos is gone for the day

## 2017-12-21 NOTE — Telephone Encounter (Signed)
I spoke with pt; pt has nerve damage in side of face;Dr Diona Browner is aware; hard to make mouth move the right way to talk or eat for a couple of weeks. Gabapentin works long enough for pt to eat. Pt request referral rather than appt with Dr Diona Browner. Pt recently saw pulmonologist and pt was advised to see neurologist. Pt request cb. Pt said she needs appt right away.

## 2017-12-21 NOTE — Telephone Encounter (Signed)
I am aware that she has post herpetic neuralgia that she is on gabapentin for...   Does she believe these symptoms are  due to pain from post herpetic neuralgia she has had in the past? Is that what she wants a referral for... Or is she having facial weakness or numbness that is new?

## 2017-12-22 ENCOUNTER — Other Ambulatory Visit: Payer: Self-pay | Admitting: Family Medicine

## 2017-12-22 DIAGNOSIS — B0229 Other postherpetic nervous system involvement: Secondary | ICD-10-CM

## 2017-12-22 NOTE — Telephone Encounter (Signed)
Patient notified by telephone that one of the referral coordinators will be in touch to get this set up for her. 

## 2017-12-22 NOTE — Telephone Encounter (Signed)
Referral made 

## 2017-12-22 NOTE — Progress Notes (Signed)
Referral made as requested. If cannot be seen quickly.. Pt can make and appt here to be seen to eval and treat.

## 2017-12-22 NOTE — Telephone Encounter (Signed)
She said is pain similar to what she has always had, but it is not going away as it has in the past with gabapentin. Dr Annamaria Boots had recommended she see a neurologist. Would like a referral. [facility] Ellouise Newer was on her insurance list.

## 2017-12-29 ENCOUNTER — Encounter: Payer: Self-pay | Admitting: Neurology

## 2018-01-09 ENCOUNTER — Telehealth: Payer: Self-pay | Admitting: Family Medicine

## 2018-01-09 MED ORDER — GABAPENTIN 300 MG PO CAPS: ORAL_CAPSULE | ORAL | 0 refills | Status: DC

## 2018-01-09 NOTE — Telephone Encounter (Signed)
Spoke with Ms. Francom.  She states she is scheduled to see the neurologist on Monday but does not have enough gabapentin to make it to that appointment.  Would like a 30 day supply sent to [facility].

## 2018-01-09 NOTE — Telephone Encounter (Signed)
Rx sent in for 30 day supply of gabapentin.

## 2018-01-09 NOTE — Telephone Encounter (Signed)
Copied from [place] [id number]. Topic: Quick Communication - See Telephone Encounter >> Jan 09, 2018  4:33 PM Rutherford Nail, [place] wrote: CRM for notification. See Telephone encounter for: 01/09/18. Patient calling and states that she would like a call back today from Dr Rometta Emery nurse, Butch Penny. States that it is regarding her gabapentin. States that she needs a refill, but she has to speak to Butch Penny before she calls to get the refill. CB#: [phone]

## 2018-01-11 NOTE — Telephone Encounter (Signed)
Pt called about the medication; pt states the pharmacy is needing verbal approval to refill the medication early; contact pharmacy to advise

## 2018-01-11 NOTE — Telephone Encounter (Signed)
Spoke with pharmacist at [facility] and gave verbal okay to refill Gabapentin early.  Left message for [name] Zimmerman that Suzie Portela is getting her prescription ready for her.

## 2018-01-15 ENCOUNTER — Ambulatory Visit: Payer: Medicare HMO | Admitting: Neurology

## 2018-01-15 ENCOUNTER — Encounter

## 2018-01-15 ENCOUNTER — Encounter: Payer: Self-pay | Admitting: Neurology

## 2018-01-15 VITALS — BP 140/80 | HR 80 | Ht 59.5 in | Wt 170.4 lb

## 2018-01-15 DIAGNOSIS — B0229 Other postherpetic nervous system involvement: Secondary | ICD-10-CM

## 2018-01-15 DIAGNOSIS — R200 Anesthesia of skin: Secondary | ICD-10-CM

## 2018-01-15 MED ORDER — GABAPENTIN 600 MG PO TABS: ORAL_TABLET | ORAL | 11 refills | Status: DC

## 2018-01-15 NOTE — Patient Instructions (Addendum)
1. Schedule MRI face (trigeminal protocol) with and without contrast  We have sent a referral to [facility] for your MRI and they will call you directly to schedule your appt. They are located at [place]. If you need to contact them directly please call [phone].  2. Increase Gabapentin to 900mg  three times a day for a week. If still a lot of pain, increase to 900mg  in AM, 900mg  at supper, 1200mg  at bedtime for a week, can increase further every week until we get to 1200mg  three times a day  3. Keep me updated on how you are feeling, we can adjust medications further as needed  4. Follow-up in 5-6 months, call for any changes

## 2018-01-15 NOTE — Progress Notes (Signed)
NEUROLOGY CONSULTATION NOTE  Katie Zimmerman MRN: [id number] DOB: December 03, 1945  Referring provider: Dr. Eliezer Lofts Primary care provider: Dr. Eliezer Lofts  Reason for consult:  Postherpetic neuralgia  Dear Dr Diona Browner:  Thank you for your kind referral of Katie Zimmerman for consultation of the above symptoms. Although her history is well known to you, please allow me to reiterate it for the purpose of our medical record. The patient was accompanied to the clinic by her husband who also provides collateral information. Records and images were personally reviewed where available.   HISTORY OF PRESENT ILLNESS: This is a pleasant 72 year old right-handed woman with a history of breast cancer, presenting for postherpetic neuralgia. She had shingles affecting the right side of her face in 2000. She did well for a year or 2 with no symptoms then started having significant pain in the same distribution. She describes pain as pressure and tenderness when she washes or touches her cheek on the right side of her nose. It is painful to talk, with pain radiating from the right temporal region down the right side of her tongue. It is hard to eat and talk. Bending down seems to worsen pain. She does have a diagnosis of TMJ on the right as well. She was tried on different medications in the past few years and found gabapentin helped for a while, then stopped working. She tried Lyrica several years ago which also helped for a little while, then she saw a neurologist who increased to the dose to 200mg  but she never filled the higher dose because she developed kidney stones in 2011. Around that time, the pain was not bothering her so much, but when pain recurred, she was restarted on gabapentin which worked pretty good until recently. For the past few months, she has had a constant 10/10 pain (appears comfortable in the office today), when she has sharp pains, the pain would be more than a 10. Pain is worse at night, really  bad around 9pm. She takes her gabapentin and can sleep. The last few days she has increased gabapentin 300mg  taking 3 in AM, 2 at supper, 3 at bedtime. No drowsiness. She denies any tinnitus but feels she has lost some hearing in the right ear. No vision changes. For a time she was having headaches with pressure over the vertex, but none lately. No nausea/vomiting, dizziness, diplopia, dysarthria/dysphagia, neck pain, focal numbness/tingling/weakness in the extremities, bowel/bladder dysfunction. Left side of face is unaffected. She has chronic back pain.   PAST MEDICAL HISTORY: Past Medical History:  Diagnosis Date  . Breast cancer ([place]) 12/2011  . Breast cancer ([place]) 12/2011   DCIS  . Complication of anesthesia    hard to wake up age 89-not since  . Fibromyalgia   . GERD (gastroesophageal reflux disease)   . History of radiation therapy 02/29/12 -04/21/12   right breast, 5040 cGy in 28 fx, boost to total 6240 cGy  . Osteoporosis   . Personal history of colonic polyps-adenomas 05/25/2012  . Personal history of radiation therapy   . PHN (postherpetic neuralgia) 2000   Right side of face    PAST SURGICAL HISTORY: Past Surgical History:  Procedure Laterality Date  . APPENDECTOMY    . BIOPSY BREAST  01/10/12   Right breast needle cor biopsy  . BREAST BIOPSY  01/10/2012  . BREAST CYST ASPIRATION  01/10/2012  . BREAST LUMPECTOMY Right 01/26/2012   Right/ERPR+ DCIS, right ax snbx  . FOOT SURGERY  1996   rt/lt great toes  . GLAUCOMA SURGERY  1993   laser  . OVARIAN CYST SURGERY  1964  . TUBAL LIGATION  1976    MEDICATIONS: Current Outpatient Medications on File Prior to Visit  Medication Sig Dispense Refill  . acetaminophen (TYLENOL) 500 MG tablet Take 1,000 mg by mouth every 4 (four) hours as needed for mild pain, moderate pain or headache.     . albuterol (PROVENTIL HFA;VENTOLIN HFA) 108 (90 Base) MCG/ACT inhaler Inhale 2 puffs into the lungs every 6 (six) hours as needed for  wheezing or shortness of breath. 1 Inhaler 3  . cholecalciferol (VITAMIN D) 1000 units tablet Take 4,000 Units by mouth daily.    . Fluticasone-Umeclidin-Vilant (TRELEGY ELLIPTA) 100-62.5-25 MCG/INH AEPB Inhale 1 puff into the lungs daily. Rinse mouth. 1 each 12  . gabapentin (NEURONTIN) 300 MG capsule Take 2 capsules by mouth in the morning and 4 capsules by mouth at bedtime. 180 capsule 0  . Spacer/Aero-Holding Chambers (OPTICHAMBER DIAMOND) DEVI Use with Symbicort Inhaler 1 Device 0   No current facility-administered medications on file prior to visit.     ALLERGIES: Allergies  Allergen Reactions  . Penicillins Anaphylaxis, Rash and Other (See Comments)    Has patient had a PCN reaction causing immediate rash, facial/tongue/throat swelling, SOB or lightheadedness with hypotension: Yes Has patient had a PCN reaction causing severe rash involving mucus membranes or skin necrosis: No Has patient had a PCN reaction that required hospitalization No Has patient had a PCN reaction occurring within the last 10 years: No If all of the above answers are "NO", then may proceed with Cephalosporin use.  . Venlafaxine Nausea Only  . Sulfonamide Derivatives Rash    FAMILY HISTORY: Family History  Problem Relation Age of Onset  . Hypertension Mother   . Cancer Mother        LIVER cancer  . Hypertension Father   . Diabetes Father   . Heart failure Father   . Breast cancer Sister 56       NOT hormone receptor breast cancer  . Breast cancer Maternal Aunt 75  . Breast cancer Other        dx in her 55s  . Breast cancer Cousin 60       maternal cousin  . Breast cancer Cousin        maternal cousin  . Breast cancer Cousin        maternal cousin  . Cancer Cousin        unknown form of cancer; maternal cousin  . Cancer Cousin        unknown form of cancer; maternal cousin  . Cancer Cousin 11       ? lung cancer; maternal cousin  . Breast cancer Cousin        dx 50s-50s; paternal cousin  .  Breast cancer Cousin        dx in her 78s; paternal cousin  . Colon cancer Neg Hx   . Stomach cancer Neg Hx     SOCIAL HISTORY: Social History   Socioeconomic History  . Marital status: Married    Spouse name: Not on file  . Number of children: 2  . Years of education: Not on file  . Highest education level: Not on file  Occupational History  . Occupation: CONSULTANT    Employer: [employer]: Retired  Scientific laboratory technician  . Financial resource strain: Not on file  . Food insecurity:  Worry: Not on file    Inability: Not on file  . Transportation needs:    Medical: Not on file    Non-medical: Not on file  Tobacco Use  . Smoking status: Never Smoker  . Smokeless tobacco: Never Used  Substance and Sexual Activity  . Alcohol use: Yes    Comment: occasional glass of wine  . Drug use: No  . Sexual activity: Not Currently  Lifestyle  . Physical activity:    Days per week: Not on file    Minutes per session: Not on file  . Stress: Not on file  Relationships  . Social connections:    Talks on phone: Not on file    Gets together: Not on file    Attends religious service: Not on file    Active member of club or organization: Not on file    Attends meetings of clubs or organizations: Not on file    Relationship status: Not on file  . Intimate partner violence:    Fear of current or ex partner: Not on file    Emotionally abused: Not on file    Physically abused: Not on file    Forced sexual activity: Not on file  Other Topics Concern  . Not on file  Social History Narrative   Reviewed 12/2013   Regular exercise-yes-intermittantly at [facility] gym   Diet: fruits and veggies    REVIEW OF SYSTEMS: Constitutional: No fevers, chills, or sweats, no generalized fatigue, change in appetite Eyes: No visual changes, double vision, eye pain Ear, nose and throat: No hearing loss, ear pain, nasal congestion, sore throat Cardiovascular: No chest pain, palpitations Respiratory:  No  shortness of breath at rest or with exertion, wheezes GastrointestinaI: No nausea, vomiting, diarrhea, abdominal pain, fecal incontinence Genitourinary:  No dysuria, urinary retention or frequency Musculoskeletal:  No neck pain,+ back pain Integumentary: No rash, pruritus, skin lesions Neurological: as above Psychiatric: No depression, insomnia, anxiety Endocrine: No palpitations, fatigue, diaphoresis, mood swings, change in appetite, change in weight, increased thirst Hematologic/Lymphatic:  No anemia, purpura, petechiae. Allergic/Immunologic: no itchy/runny eyes, nasal congestion, recent allergic reactions, rashes  PHYSICAL EXAM: Vitals:   01/15/18 1318  BP: 140/80  Pulse: 80   General: No acute distress Head:  Normocephalic/atraumatic Eyes: Fundoscopic exam shows bilateral sharp discs, no vessel changes, exudates, or hemorrhages Neck: supple, no paraspinal tenderness, full range of motion Back: No paraspinal tenderness Heart: regular rate and rhythm Lungs: Clear to auscultation bilaterally. Vascular: No carotid bruits. Skin/Extremities: No rash, no edema Neurological Exam: Mental status: alert and oriented to person, place, and time, no dysarthria or aphasia, Fund of knowledge is appropriate.  Recent and remote memory are intact.  Attention and concentration are normal.    Able to name objects and repeat phrases. Cranial nerves: CN I: not tested CN II: pupils equal, round and reactive to light, visual fields intact, fundi unremarkable. CN III, IV, VI:  full range of motion, no nystagmus, no ptosis CN V: decreased cold on right V1-2, decreased pin right V2, decreased light touch V1-2. She has pain on light touch at right V3 CN VII: upper and lower face symmetric CN VIII: hearing intact to finger rub CN IX, X: gag intact, uvula midline CN XI: sternocleidomastoid and trapezius muscles intact CN XII: tongue midline Bulk & Tone: normal, no fasciculations. Motor: 5/5 throughout  with no pronator drift. Sensation: intact to light touch, cold, pin, vibration and joint position sense.  No extinction to double simultaneous stimulation.  Romberg test negative Deep Tendon Reflexes: +2 throughout, no ankle clonus Plantar responses: downgoing bilaterally Cerebellar: no incoordination on finger to nose, heel to shin. No dysdiadochokinesia Gait: narrow-based and steady, able to tandem walk adequately. Tremor: none  IMPRESSION: This is a pleasant 72 year old right-handed woman with a history of breast cancer, presenting for worsening right facial pain. She reports facial pain started 1-2 years after a bout of shingles on the same distribution, suggestive of postherpetic neuralgia. She also has numbness on the right side of her face. Pain has been worsening recently, we discussed increasing gabapentin to 900mg  TID, and depending on response and side effects, potentially titrating up to 1200mg  TID. If she does not tolerate this, we may add on nortriptyline or oxcarbazepine. MRI face (trigeminal protocol) with and without contrast will be ordered to assess for underlying structural abnormality. Follow-up in 5-6 months, she knows to call for any changes.   Thank you for allowing me to participate in the care of this patient. Please do not hesitate to call for any questions or concerns.   Ellouise Newer, M.D.  CC: Dr. Diona Browner

## 2018-01-19 ENCOUNTER — Encounter: Payer: Self-pay | Admitting: Neurology

## 2018-01-28 ENCOUNTER — Ambulatory Visit
Admission: RE | Admit: 2018-01-28 | Discharge: 2018-01-28 | Disposition: A | Discharge status: Home / Self Care | Payer: Medicare HMO | Source: Ambulatory Visit | Attending: Neurology | Admitting: Neurology

## 2018-01-28 DIAGNOSIS — R51 Headache: Secondary | ICD-10-CM | POA: Diagnosis not present

## 2018-01-28 DIAGNOSIS — R200 Anesthesia of skin: Secondary | ICD-10-CM

## 2018-01-28 DIAGNOSIS — B0229 Other postherpetic nervous system involvement: Secondary | ICD-10-CM

## 2018-01-28 MED ORDER — GADOBENATE DIMEGLUMINE 529 MG/ML IV SOLN
15.0000 mL | Freq: Once | INTRAVENOUS | Status: AC | PRN
  Administered 2018-01-28: 15 mL via INTRAVENOUS

## 2018-02-09 ENCOUNTER — Telehealth: Payer: Self-pay | Admitting: Neurology

## 2018-02-09 NOTE — Telephone Encounter (Signed)
Patient wants to talk to Dr Delice Lesch herself, she states that she is having a lot of pain in the her face still. She had the MRI and has not heard anything back from [place] please call

## 2018-02-09 NOTE — Telephone Encounter (Signed)
Patient has not called back. Mychart message sent to patient.   Katie Zimmerman- can you follow up on Monday.

## 2018-02-09 NOTE — Telephone Encounter (Signed)
States she wants to speak with you, but if you will let me know about MR results I would be happy to call her back. Thanks.

## 2018-02-09 NOTE — Telephone Encounter (Signed)
Left message on machine for patient to call back.

## 2018-02-09 NOTE — Telephone Encounter (Signed)
Pls let her know that the MRI did not show any changes from her prior scan in 2014, there is no evidence of tumor, stroke, or active changes. It would really be medication management at this point, can she tolerate further increasing the gabapentin to 1200mg  TID? If too drowsy, we can add on low dose nortriptyline 10mg  qhs, main side effect is drowsiness. thanks

## 2018-02-12 MED ORDER — NORTRIPTYLINE HCL 10 MG PO CAPS: ORAL_CAPSULE | ORAL | 6 refills | Status: DC

## 2018-02-12 NOTE — Telephone Encounter (Signed)
Let's add on nortriptyline to the gabapantin, main side effect is drowsiness. Start nortripyline 10mg  qhs for a week, then increase to 20mg  every night. We can increase further if needed. Thanks

## 2018-02-12 NOTE — Telephone Encounter (Signed)
Spoke with pt relaying message below. Verified preferred pharmacy.  Rx   Nortriptyline 10mg , #60 with 6 refills  Sent to [facility] in [place].

## 2018-02-12 NOTE — Telephone Encounter (Signed)
Spoke with pt.  She states that she is in excruciating pain, to the point that it is effecting her speech.  Pt states that the increase of Gabapentin "has done nothing".  Advised that Dr. Delice Lesch may change medications.  Please advise.

## 2018-02-12 NOTE — Addendum Note (Signed)
Addended by: Lenny Pastel on: 02/12/2018 04:15 PM   Modules accepted: Orders

## 2018-02-20 ENCOUNTER — Encounter: Payer: Self-pay | Admitting: Family Medicine

## 2018-02-20 ENCOUNTER — Ambulatory Visit (INDEPENDENT_AMBULATORY_CARE_PROVIDER_SITE_OTHER): Payer: Medicare HMO

## 2018-02-20 ENCOUNTER — Ambulatory Visit: Payer: Medicare HMO

## 2018-02-20 ENCOUNTER — Ambulatory Visit (INDEPENDENT_AMBULATORY_CARE_PROVIDER_SITE_OTHER): Payer: Medicare HMO | Admitting: Family Medicine

## 2018-02-20 VITALS — BP 124/90 | HR 90 | Temp 98.8°F | Ht 59.0 in | Wt 171.2 lb

## 2018-02-20 DIAGNOSIS — M810 Age-related osteoporosis without current pathological fracture: Secondary | ICD-10-CM

## 2018-02-20 DIAGNOSIS — J449 Chronic obstructive pulmonary disease, unspecified: Secondary | ICD-10-CM | POA: Diagnosis not present

## 2018-02-20 DIAGNOSIS — J441 Chronic obstructive pulmonary disease with (acute) exacerbation: Secondary | ICD-10-CM | POA: Diagnosis not present

## 2018-02-20 DIAGNOSIS — R7303 Prediabetes: Secondary | ICD-10-CM | POA: Diagnosis not present

## 2018-02-20 DIAGNOSIS — E7800 Pure hypercholesterolemia, unspecified: Secondary | ICD-10-CM | POA: Diagnosis not present

## 2018-02-20 DIAGNOSIS — Z0000 Encounter for general adult medical examination without abnormal findings: Secondary | ICD-10-CM

## 2018-02-20 LAB — COMPREHENSIVE METABOLIC PANEL
ALT: 28 U/L (ref 0–35)
AST: 22 U/L (ref 0–37)
Albumin: 4 g/dL (ref 3.5–5.2)
Alkaline Phosphatase: 96 U/L (ref 39–117)
BUN: 13 mg/dL (ref 6–23)
CO2: 28 mEq/L (ref 19–32)
Calcium: 9.6 mg/dL (ref 8.4–10.5)
Chloride: 103 mEq/L (ref 96–112)
Creatinine, Ser: 0.86 mg/dL (ref 0.40–1.20)
GFR: 68.91 mL/min (ref >60.00)
Glucose, Bld: 130 mg/dL — ABNORMAL HIGH (ref 70–99)
Potassium: 3.9 mEq/L (ref 3.5–5.1)
Sodium: 138 mEq/L (ref 135–145)
Total Bilirubin: 0.4 mg/dL (ref 0.2–1.2)
Total Protein: 7.4 g/dL (ref 6.0–8.3)

## 2018-02-20 LAB — LIPID PANEL
Cholesterol: 164 mg/dL (ref 0–200)
HDL: 41.7 mg/dL (ref >39.00)
LDL Cholesterol: 88 mg/dL (ref 0–99)
NonHDL: 122.61
Total CHOL/HDL Ratio: 4
Triglycerides: 173 mg/dL — ABNORMAL HIGH (ref 0.0–149.0)
VLDL: 34.6 mg/dL (ref 0.0–40.0)

## 2018-02-20 LAB — HEMOGLOBIN A1C: Hgb A1c MFr Bld: 6.5 % (ref 4.6–6.5)

## 2018-02-20 LAB — LDL CHOLESTEROL, DIRECT: Direct LDL: 102 mg/dL

## 2018-02-20 LAB — VITAMIN D 25 HYDROXY (VIT D DEFICIENCY, FRACTURES): VITD: 61.06 ng/mL (ref 30.00–100.00)

## 2018-02-20 MED ORDER — PREDNISONE 20 MG PO TABS: ORAL_TABLET | ORAL | 0 refills | Status: DC

## 2018-02-20 MED ORDER — AZITHROMYCIN 250 MG PO TABS: ORAL_TABLET | ORAL | 0 refills | Status: DC

## 2018-02-20 NOTE — Progress Notes (Signed)
   Subjective:    Patient ID: Katie Zimmerman, female    DOB: 03/10/1946, 72 y.o.   MRN: [id number]  Cough  This is a new problem. The current episode started yesterday. The problem has been gradually worsening. The cough is productive of sputum and productive of purulent sputum. Associated symptoms include ear congestion, ear pain, headaches, nasal congestion, rhinorrhea and shortness of breath. Pertinent negatives include no wheezing. The symptoms are aggravated by lying down. Risk factors: nonsmoking. She has tried nothing for the symptoms. Her past medical history is significant for COPD and environmental allergies.    Blood pressure 124/90, pulse 90, temperature 98.8 F (37.1 C), temperature source Oral, height 4\' 11"  (1.499 m), weight 171 lb 4 oz (77.7 kg), SpO2 96 %.  Review of Systems  HENT: Positive for ear pain and rhinorrhea.   Respiratory: Positive for cough and shortness of breath. Negative for wheezing.   Allergic/Immunologic: Positive for environmental allergies.  Neurological: Positive for headaches.       Objective:   Physical Exam  Constitutional: Vital signs are normal. She appears well-developed and well-nourished. She is cooperative.  Non-toxic appearance. She does not appear ill. No distress.  HENT:  Head: Normocephalic.  Right Ear: Hearing, tympanic membrane, external ear and ear canal normal. Tympanic membrane is not erythematous, not retracted and not bulging.  Left Ear: Hearing, tympanic membrane, external ear and ear canal normal. Tympanic membrane is not erythematous, not retracted and not bulging.  Nose: Mucosal edema and rhinorrhea present. Right sinus exhibits no maxillary sinus tenderness and no frontal sinus tenderness. Left sinus exhibits no maxillary sinus tenderness and no frontal sinus tenderness.  Mouth/Throat: Uvula is midline, oropharynx is clear and moist and mucous membranes are normal.  Eyes: Pupils are equal, round, and reactive to light.  Conjunctivae, EOM and lids are normal. Lids are everted and swept, no foreign bodies found.  Neck: Trachea normal and normal range of motion. Neck supple. Carotid bruit is not present. No thyroid mass and no thyromegaly present.  Cardiovascular: Normal rate, regular rhythm, S1 normal, S2 normal, normal heart sounds, intact distal pulses and normal pulses. Exam reveals no gallop and no friction rub.  No murmur heard. Pulmonary/Chest: Effort normal. No tachypnea. No respiratory distress. She has no decreased breath sounds. She has wheezes. She has no rhonchi. She has no rales.   Scattered wheeze  Neurological: She is alert.  Skin: Skin is warm, dry and intact. No rash noted.  Psychiatric: Her speech is normal and behavior is normal. Judgment normal. Her mood appears not anxious. Cognition and memory are normal. She does not exhibit a depressed mood.          Assessment & Plan:

## 2018-02-20 NOTE — Progress Notes (Signed)
PCP notes:   Health maintenance:  Flu vaccine - addressed  Abnormal screenings:   Hearing - failed  Hearing Screening   125Hz  250Hz  500Hz  1000Hz  2000Hz  3000Hz  4000Hz  6000Hz  8000Hz   Right ear:   0 0 0  40    Left ear:   40 40 40  40      Patient concerns:   Patient is having respiratory concerns including cough and congestion. PCP notified. Same day acute visit scheduled.   Nurse concerns:  None  Next PCP appt:   02/23/2018 @ 1400

## 2018-02-20 NOTE — Patient Instructions (Signed)
Complete course antibiotics.  Fill prednisone if wheezing or chest tightness starts. Mucinex DM twice daily.  rest. Fluids.  Call if not improving as expected.  Go to ER for severe shortness of breath.

## 2018-02-20 NOTE — Progress Notes (Signed)
Subjective:   Katie Zimmerman is a 72 y.o. female who presents for Medicare Annual (Subsequent) preventive examination.  Review of Systems:  N/A Cardiac Risk Factors include: advanced age (>75men, >40 women);obesity (BMI >30kg/m2);dyslipidemia     Objective:     Vitals: BP 124/90 (BP Location: Left Arm, Patient Position: Sitting, Cuff Size: Normal)   Pulse 90   Temp 98.8 F (37.1 C) (Oral)   Ht 4\' 11"  (1.499 m) Comment: shoes  Wt 171 lb 4 oz (77.7 kg)   SpO2 96%   BMI 34.59 kg/m   Body mass index is 34.59 kg/m.  Advanced Directives 02/20/2018 03/02/2017 02/07/2017 12/24/2016 07/23/2016 04/19/2016 03/05/2016  Does Patient Have a Medical Advance Directive? No No No No No No No  Would patient like information on creating a medical advance directive? Yes (MAU/Ambulatory/Procedural Areas - Information given) No - Patient declined - - No - Patient declined - -    Tobacco Social History   Tobacco Use  Smoking Status Never Smoker  Smokeless Tobacco Never Used     Counseling given: Not Answered   Clinical Intake:  Pre-visit preparation completed: Yes  Pain Score: 10-Worst pain ever     Nutritional Status: BMI > 30  Obese Nutritional Risks: None Diabetes: No  How often do you need to have someone help you when you read instructions, pamphlets, or other written materials from your doctor or pharmacy?: 1 - Never What is the last grade level you completed in school?: Associate degree  Interpreter Needed?: No  Comments: pt lives with spouse Information entered by :: LPinson, LPN  Past Medical History:  Diagnosis Date  . Breast cancer ([place]) 12/2011  . Breast cancer ([place]) 12/2011   DCIS  . Complication of anesthesia    hard to wake up age 64-not since  . Fibromyalgia   . GERD (gastroesophageal reflux disease)   . History of radiation therapy 02/29/12 -04/21/12   right breast, 5040 cGy in 28 fx, boost to total 6240 cGy  . Osteoporosis   . Personal history of colonic  polyps-adenomas 05/25/2012  . Personal history of radiation therapy   . PHN (postherpetic neuralgia) 2000   Right side of face   Past Surgical History:  Procedure Laterality Date  . APPENDECTOMY    . BIOPSY BREAST  01/10/12   Right breast needle cor biopsy  . BREAST BIOPSY  01/10/2012  . BREAST CYST ASPIRATION  01/10/2012  . BREAST LUMPECTOMY Right 01/26/2012   Right/ERPR+ DCIS, right ax snbx  . FOOT SURGERY  1996   rt/lt great toes  . GLAUCOMA SURGERY  1993   laser  . OVARIAN CYST SURGERY  1964  . TUBAL LIGATION  1976   Family History  Problem Relation Age of Onset  . Hypertension Mother   . Cancer Mother        LIVER cancer  . Hypertension Father   . Diabetes Father   . Heart failure Father   . Breast cancer Sister 44       NOT hormone receptor breast cancer  . Breast cancer Maternal Aunt 75  . Breast cancer Other        dx in her 29s  . Breast cancer Cousin 18       maternal cousin  . Breast cancer Cousin        maternal cousin  . Breast cancer Cousin        maternal cousin  . Cancer Cousin  unknown form of cancer; maternal cousin  . Cancer Cousin        unknown form of cancer; maternal cousin  . Cancer Cousin 11       ? lung cancer; maternal cousin  . Breast cancer Cousin        dx 56s-50s; paternal cousin  . Breast cancer Cousin        dx in her 21s; paternal cousin  . Colon cancer Neg Hx   . Stomach cancer Neg Hx    Social History   Socioeconomic History  . Marital status: Married    Spouse name: Not on file  . Number of children: 2  . Years of education: Not on file  . Highest education level: Not on file  Occupational History  . Occupation: CONSULTANT    Employer: [employer]: Retired  Scientific laboratory technician  . Financial resource strain: Not on file  . Food insecurity:    Worry: Not on file    Inability: Not on file  . Transportation needs:    Medical: Not on file    Non-medical: Not on file  Tobacco Use  . Smoking status: Never  Smoker  . Smokeless tobacco: Never Used  Substance and Sexual Activity  . Alcohol use: Yes    Comment: occasional glass of wine  . Drug use: No  . Sexual activity: Not Currently  Lifestyle  . Physical activity:    Days per week: Not on file    Minutes per session: Not on file  . Stress: Not on file  Relationships  . Social connections:    Talks on phone: Not on file    Gets together: Not on file    Attends religious service: Not on file    Active member of club or organization: Not on file    Attends meetings of clubs or organizations: Not on file    Relationship status: Not on file  Other Topics Concern  . Not on file  Social History Narrative   Reviewed 12/2013   Regular exercise-yes-intermittantly at [facility] gym   Diet: fruits and veggies    Outpatient Encounter Medications as of 02/20/2018  Medication Sig  . acetaminophen (TYLENOL) 500 MG tablet Take 1,000 mg by mouth every 4 (four) hours as needed for mild pain, moderate pain or headache.   . albuterol (PROVENTIL HFA;VENTOLIN HFA) 108 (90 Base) MCG/ACT inhaler Inhale 2 puffs into the lungs every 6 (six) hours as needed for wheezing or shortness of breath.  Marland Kitchen azithromycin (ZITHROMAX) 250 MG tablet 2 tab po x 1 day then 1 tab po daily  . cholecalciferol (VITAMIN D) 1000 units tablet Take 4,000 Units by mouth daily.  . Fluticasone-Umeclidin-Vilant (TRELEGY ELLIPTA) 100-62.5-25 MCG/INH AEPB Inhale 1 puff into the lungs daily. Rinse mouth.  . gabapentin (NEURONTIN) 600 MG tablet Take 2 tablets three times a day  . nortriptyline (PAMELOR) 10 MG capsule Take 1 capsule each night for 1 week, then increase to 2 capsules each night.  . predniSONE (DELTASONE) 20 MG tablet 3 tabs by mouth daily x 3 days, then 2 tabs by mouth daily x 2 days then 1 tab by mouth daily x 2 days  Fill if wheezing and chest tightness.   No facility-administered encounter medications on file as of 02/20/2018.     Activities of Daily Living In your present  state of health, do you have any difficulty performing the following activities: 02/20/2018  Hearing? Y  Vision? N  Difficulty  concentrating or making decisions? N  Walking or climbing stairs? N  Dressing or bathing? N  Doing errands, shopping? N  Preparing Food and eating ? N  Using the Toilet? N  In the past six months, have you accidently leaked urine? N  Do you have problems with loss of bowel control? N  Managing your Medications? N  Managing your Finances? N  Housekeeping or managing your Housekeeping? N  Some recent data might be hidden    Patient Care Team: [name], MD as PCP - General    Assessment:   This is a routine wellness examination for [place].   Hearing Screening   125Hz  250Hz  500Hz  1000Hz  2000Hz  3000Hz  4000Hz  6000Hz  8000Hz   Right ear:   0 0 0  40    Left ear:   40 40 40  40    Vision Screening Comments: Vision exam in 2019 with Dr. Satira Sark   Exercise Activities and Dietary recommendations Current Exercise Habits: The patient does not participate in regular exercise at present, Exercise limited by: None identified  Goals    . Patient Stated     Starting 02/20/2018, I will continue to take medications as prescribed.        Fall Risk Fall Risk  02/20/2018 01/15/2018 02/07/2017 02/04/2016 01/22/2015  Falls in the past year? No No No No No   Depression Screen PHQ 2/9 Scores 02/20/2018 02/07/2017 02/04/2016 01/22/2015  PHQ - 2 Score 0 0 0 0  PHQ- 9 Score 0 0 - -     Cognitive Function MMSE - Mini Mental State Exam 02/20/2018 02/07/2017  Orientation to time 5 5  Orientation to Place 5 5  Registration 3 3  Attention/ Calculation 0 0  Recall 3 3  Language- name 2 objects 0 0  Language- repeat 1 1  Language- follow 3 step command 3 3  Language- read & follow direction 0 0  Write a sentence 0 0  Copy design 0 0  Total score 20 20     PLEASE NOTE: A Mini-Cog screen was completed. Maximum score is 20. A value of 0 denotes this part of Folstein MMSE was not  completed or the patient failed this part of the Mini-Cog screening.   Mini-Cog Screening Orientation to Time - Max 5 pts Orientation to Place - Max 5 pts Registration - Max 3 pts Recall - Max 3 pts Language Repeat - Max 1 pts Language Follow 3 Step Command - Max 3 pts    Immunization History  Administered Date(s) Administered  . Influenza Split 05/29/2012  . Influenza Whole 03/07/2005  . Influenza,inj,Quad PF,6+ Mos 01/20/2014, 01/22/2015, 02/04/2016, 02/07/2017  . Pneumococcal Conjugate-13 01/20/2014  . Pneumococcal Polysaccharide-23 10/21/2011  . Td 07/02/1998    Screening Tests Health Maintenance  Topic Date Due  . COLONOSCOPY  05/16/2018 (Originally 05/18/2015)  . TETANUS/TDAP  07/01/2018 (Originally 07/02/2008)  . INFLUENZA VACCINE  08/29/2018 (Originally 12/28/2017)  . MAMMOGRAM  10/28/2018  . DEXA SCAN  Completed  . Hepatitis C Screening  Completed  . PNA vac Low Risk Adult  Completed      Plan:     I have personally reviewed, addressed, and noted the following in the patient's chart:  A. Medical and social history B. Use of alcohol, tobacco or illicit drugs  C. Current medications and supplements D. Functional ability and status E.  Nutritional status F.  Physical activity G. Advance directives H. List of other physicians I.  Hospitalizations, surgeries, and ER visits in  previous 12 months J.  Vitals K. Screenings to include hearing, vision, cognitive, depression L. Referrals and appointments - none  In addition, I have reviewed and discussed with patient certain preventive protocols, quality metrics, and best practice recommendations. A written personalized care plan for preventive services as well as general preventive health recommendations were provided to patient.  See attached scanned questionnaire for additional information.   Signed,   Lindell Noe, MHA, BS, LPN Health Coach

## 2018-02-20 NOTE — Assessment & Plan Note (Signed)
Concern for bacterial infection... Treat with Z-pack. Minimal wheezing.. Will given pred taper to start if wheezing and chest tightness progresses.  Symptomatic care reviewed.

## 2018-02-20 NOTE — Patient Instructions (Signed)
Katie Zimmerman , Thank you for taking time to come for your Medicare Wellness Visit. I appreciate your ongoing commitment to your health goals. Please review the following plan we discussed and let me know if I can assist you in the future.   These are the goals we discussed: Goals    . Patient Stated     Starting 02/20/2018, I will continue to take medications as prescribed.        This is a list of the screening recommended for you and due dates:  Health Maintenance  Topic Date Due  . Colon Cancer Screening  05/16/2018*  . Tetanus Vaccine  07/01/2018*  . Flu Shot  08/29/2018*  . Mammogram  10/28/2018  . DEXA scan (bone density measurement)  Completed  .  Hepatitis C: One time screening is recommended by Center for Disease Control  (CDC) for  adults born from [birth year] through 1965.   Completed  . Pneumonia vaccines  Completed  *Topic was postponed. The date shown is not the original due date.   Preventive Care for Adults  A healthy lifestyle and preventive care can promote health and wellness. Preventive health guidelines for adults include the following key practices.  . A routine yearly physical is a good way to check with your health care provider about your health and preventive screening. It is a chance to share any concerns and updates on your health and to receive a thorough exam.  . Visit your dentist for a routine exam and preventive care every 6 months. Brush your teeth twice a day and floss once a day. Good oral hygiene prevents tooth decay and gum disease.  . The frequency of eye exams is based on your age, health, family medical history, use  of contact lenses, and other factors. Follow your health care provider's recommendations for frequency of eye exams.  . Eat a healthy diet. Foods like vegetables, fruits, whole grains, low-fat dairy products, and lean protein foods contain the nutrients you need without too many calories. Decrease your intake of foods high in solid fats,  added sugars, and salt. Eat the right amount of calories for you. Get information about a proper diet from your health care provider, if necessary.  . Regular physical exercise is one of the most important things you can do for your health. Most adults should get at least 150 minutes of moderate-intensity exercise (any activity that increases your heart rate and causes you to sweat) each week. In addition, most adults need muscle-strengthening exercises on 2 or more days a week.  Silver Sneakers may be a benefit available to you. To determine eligibility, you may visit the website: www.silversneakers.com or contact program at 4345431359 Mon-Fri between 8AM-8PM.   . Maintain a healthy weight. The body mass index (BMI) is a screening tool to identify possible weight problems. It provides an estimate of body fat based on height and weight. Your health care provider can find your BMI and can help you achieve or maintain a healthy weight.   For adults 20 years and older: ? A BMI below 18.5 is considered underweight. ? A BMI of 18.5 to 24.9 is normal. ? A BMI of 25 to 29.9 is considered overweight. ? A BMI of 30 and above is considered obese.   . Maintain normal blood lipids and cholesterol levels by exercising and minimizing your intake of saturated fat. Eat a balanced diet with plenty of fruit and vegetables. Blood tests for lipids and cholesterol should begin at  age 41 and be repeated every 5 years. If your lipid or cholesterol levels are high, you are over 50, or you are at high risk for heart disease, you may need your cholesterol levels checked more frequently. Ongoing high lipid and cholesterol levels should be treated with medicines if diet and exercise are not working.  . If you smoke, find out from your health care provider how to quit. If you do not use tobacco, please do not start.  . If you choose to drink alcohol, please do not consume more than 2 drinks per day. One drink is  considered to be 12 ounces (355 mL) of beer, 5 ounces (148 mL) of wine, or 1.5 ounces (44 mL) of liquor.  . If you are 61-37 years old, ask your health care provider if you should take aspirin to prevent strokes.  . Use sunscreen. Apply sunscreen liberally and repeatedly throughout the day. You should seek shade when your shadow is shorter than you. Protect yourself by wearing long sleeves, pants, a wide-brimmed hat, and sunglasses year round, whenever you are outdoors.  . Once a month, do a whole body skin exam, using a mirror to look at the skin on your back. Tell your health care provider of new moles, moles that have irregular borders, moles that are larger than a pencil eraser, or moles that have changed in shape or color.

## 2018-02-21 NOTE — Progress Notes (Signed)
I reviewed health advisor's note, was available for consultation, and agree with documentation and plan.  

## 2018-02-23 ENCOUNTER — Encounter: Payer: Self-pay | Admitting: Family Medicine

## 2018-02-23 ENCOUNTER — Ambulatory Visit (INDEPENDENT_AMBULATORY_CARE_PROVIDER_SITE_OTHER): Payer: Medicare HMO | Admitting: Family Medicine

## 2018-02-23 VITALS — BP 120/80 | HR 94 | Temp 98.3°F | Ht 59.0 in | Wt 172.5 lb

## 2018-02-23 DIAGNOSIS — J449 Chronic obstructive pulmonary disease, unspecified: Secondary | ICD-10-CM | POA: Diagnosis not present

## 2018-02-23 DIAGNOSIS — B0229 Other postherpetic nervous system involvement: Secondary | ICD-10-CM | POA: Diagnosis not present

## 2018-02-23 DIAGNOSIS — J209 Acute bronchitis, unspecified: Secondary | ICD-10-CM

## 2018-02-23 DIAGNOSIS — J441 Chronic obstructive pulmonary disease with (acute) exacerbation: Secondary | ICD-10-CM | POA: Diagnosis not present

## 2018-02-23 DIAGNOSIS — J440 Chronic obstructive pulmonary disease with acute lower respiratory infection: Secondary | ICD-10-CM

## 2018-02-23 DIAGNOSIS — R7303 Prediabetes: Secondary | ICD-10-CM

## 2018-02-23 DIAGNOSIS — E7800 Pure hypercholesterolemia, unspecified: Secondary | ICD-10-CM

## 2018-02-23 DIAGNOSIS — Z853 Personal history of malignant neoplasm of breast: Secondary | ICD-10-CM

## 2018-02-23 NOTE — Assessment & Plan Note (Signed)
Tolerable control with gabapentin and nortripitline.

## 2018-02-23 NOTE — Progress Notes (Signed)
Subjective:    Patient ID: Katie Zimmerman, female    DOB: [birth year], 72 y.o.   MRN: [id number]  HPI  The patient presents for  complete physical and review of chronic health problems. He/She also has the following acute concerns today:  Continue cough, minimal SOB. No fever on antibiotics.   The patient saw Candis Musa, LPN for medicare wellness. Note reviewed in detail and important notes copied below.  Health maintenance:  Flu vaccine - addressed  Abnormal screenings:   Hearing - failed             Hearing Screening   125Hz  250Hz  500Hz  1000Hz  2000Hz  3000Hz  4000Hz  6000Hz  8000Hz   Right ear:   0 0 0  40    Left ear:   40 40 40  40     02/23/18 COPD exacerbation: gradually improving with antibiotics.  Elevated Cholesterol: LDL at goal  Lab Results  Component Value Date   CHOL 164 02/20/2018   HDL 41.70 02/20/2018   LDLCALC 88 02/20/2018   LDLDIRECT 102.0 02/20/2018   TRIG 173.0 (H) 02/20/2018   CHOLHDL 4 02/20/2018  Using medications without problems: Muscle aches:  Diet compliance: Exercise: Other complaints:  HX OF BREAST CANCER Hx of DCIS of the right breast status post lumpectomy.  Completed tamoxifen x 5 years Dr. Sondra Come radiation onc.  Dr. Wynonia Hazard oncologist. Released.. Missed last mammogram in 09/2016. Dr. Emmie Niemann in surgeon.    Post herpetic neuralgia:  moderate control  On gabapentin and nortriptiline. Followed by neurologist.  Prediabetes  Worsened control... Pt has been sick at time of labs.. Lab Results  Component Value Date   HGBA1C 6.5 02/20/2018      Social History /Family History/Past Medical History reviewed in detail and updated in EMR if needed. Blood pressure 120/80, pulse 94, temperature 98.3 F (36.8 C), temperature source Oral, height 4\' 11"  (1.499 m), weight 172 lb 8 oz (78.2 kg).  Review of Systems  Constitutional: Negative for fatigue and fever.  HENT: Negative for congestion.   Eyes: Negative for  pain.  Respiratory: Positive for cough. Negative for shortness of breath.   Cardiovascular: Negative for chest pain, palpitations and leg swelling.  Gastrointestinal: Negative for abdominal pain.  Genitourinary: Negative for dysuria and vaginal bleeding.  Musculoskeletal: Negative for back pain.  Neurological: Negative for syncope, light-headedness and headaches.  Psychiatric/Behavioral: Negative for dysphoric mood.       Objective:   Physical Exam  Constitutional: Vital signs are normal. She appears well-developed and well-nourished. She is cooperative.  Non-toxic appearance. She does not appear ill. No distress.  HENT:  Head: Normocephalic.  Right Ear: Hearing, tympanic membrane, external ear and ear canal normal.  Left Ear: Hearing, tympanic membrane, external ear and ear canal normal.  Nose: Nose normal.  Eyes: Pupils are equal, round, and reactive to light. Conjunctivae, EOM and lids are normal. Lids are everted and swept, no foreign bodies found.  Neck: Trachea normal and normal range of motion. Neck supple. Carotid bruit is not present. No thyroid mass and no thyromegaly present.  Cardiovascular: Normal rate, regular rhythm, S1 normal, S2 normal, normal heart sounds and intact distal pulses. Exam reveals no gallop.  No murmur heard. Pulmonary/Chest: Effort normal and breath sounds normal. No respiratory distress. She has no wheezes. She has no rhonchi. She has no rales. Right breast exhibits no inverted nipple and no mass. Left breast exhibits no inverted nipple and no mass. No breast tenderness, discharge or bleeding. Breasts are symmetrical.  Healed scar on right breast  Abdominal: Soft. Normal appearance and bowel sounds are normal. She exhibits no distension, no fluid wave, no abdominal bruit and no mass. There is no hepatosplenomegaly. There is no tenderness. There is no rebound, no guarding and no CVA tenderness. No hernia.  Lymphadenopathy:    She has no cervical adenopathy.      She has no axillary adenopathy.  Neurological: She is alert. She has normal strength. No cranial nerve deficit or sensory deficit.  Skin: Skin is warm, dry and intact. No rash noted.  Psychiatric: Her speech is normal and behavior is normal. Judgment normal. Her mood appears not anxious. Cognition and memory are normal. She does not exhibit a depressed mood.          Assessment & Plan:  The patient's preventative maintenance and recommended screening tests for an annual wellness exam were reviewed in full today. Brought up to date unless services declined.  Counselled on the importance of diet, exercise, and its role in overall health and mortality. The patient's FH and SH was reviewed, including their home life, tobacco status, and drug and alcohol status.   Vaccines: Uptodate with pneumovax and prevnar,consider TDAP, shingles DUE for flu  04/2012 Dr. Carlean Purl, colonoscopy 5 adenomas max 6 mm Repeat colon 04/2017 .. due DVE/PAP: not indicated Mammo: Breast cancer s/p  Lumpectomy on right, radiation, followed by Dr. Lindi Adie, last mammo 09/2016 DUE DEXA:09/2016; osteopenia, on tamoxifen (off NOV 2018), fosamax not needed. Repeat in 2 years.  Non smoker, sig second hand some. Hearing loss in right ear. Dr. Warren Danes. Hep C: neg

## 2018-02-23 NOTE — Assessment & Plan Note (Signed)
Re-eval when not sick and feeling better. Work on lifestyle changes. Hold on making new dx diabetes.

## 2018-02-23 NOTE — Assessment & Plan Note (Signed)
Good control on no medications.

## 2018-02-23 NOTE — Patient Instructions (Addendum)
Work  On low carbohydrate, decrease bread , pasta, potatos, rice, sweets, candy, soda. Increase exercise.  Call Dr. Celesta Aver office to set up 5 year repeat colonoscopy. Please stop at the front desk to set up referral for mammogram

## 2018-03-09 ENCOUNTER — Other Ambulatory Visit: Payer: Self-pay | Admitting: Family Medicine

## 2018-03-09 DIAGNOSIS — Z1231 Encounter for screening mammogram for malignant neoplasm of breast: Secondary | ICD-10-CM

## 2018-03-19 ENCOUNTER — Other Ambulatory Visit: Payer: Self-pay | Admitting: Neurology

## 2018-03-29 ENCOUNTER — Ambulatory Visit: Payer: Medicare HMO

## 2018-04-02 ENCOUNTER — Ambulatory Visit
Admission: RE | Admit: 2018-04-02 | Discharge: 2018-04-02 | Disposition: A | Discharge status: Home / Self Care | Payer: Medicare HMO | Source: Ambulatory Visit | Attending: Family Medicine | Admitting: Family Medicine

## 2018-04-02 DIAGNOSIS — Z1231 Encounter for screening mammogram for malignant neoplasm of breast: Secondary | ICD-10-CM

## 2018-05-08 ENCOUNTER — Telehealth: Payer: Self-pay | Admitting: Neurology

## 2018-05-08 NOTE — Telephone Encounter (Signed)
Returned call to pt.  No answer.  LMOM asking for return call.  

## 2018-05-08 NOTE — Telephone Encounter (Signed)
Patient is calling in stating that she is having some pain in her face. She has already taken her medication for the gabapentin. Please call her back at [phone]. Thanks!

## 2018-05-24 ENCOUNTER — Other Ambulatory Visit: Payer: Self-pay | Admitting: Family Medicine

## 2018-05-25 NOTE — Telephone Encounter (Signed)
Electronic refill request. Gabapentin Last office visit:   02/23/18 Last Filled:    180 tablet 11 01/15/2018  Please advise.

## 2018-06-18 DIAGNOSIS — H5711 Ocular pain, right eye: Secondary | ICD-10-CM | POA: Diagnosis not present

## 2018-06-21 ENCOUNTER — Encounter: Payer: Self-pay | Admitting: Internal Medicine

## 2018-06-21 ENCOUNTER — Ambulatory Visit (INDEPENDENT_AMBULATORY_CARE_PROVIDER_SITE_OTHER): Payer: Medicare Other | Admitting: Internal Medicine

## 2018-06-21 VITALS — BP 124/78 | HR 89 | Ht 59.5 in | Wt 169.4 lb

## 2018-06-21 DIAGNOSIS — J449 Chronic obstructive pulmonary disease, unspecified: Secondary | ICD-10-CM | POA: Diagnosis not present

## 2018-06-21 DIAGNOSIS — K219 Gastro-esophageal reflux disease without esophagitis: Secondary | ICD-10-CM

## 2018-06-21 DIAGNOSIS — Z23 Encounter for immunization: Secondary | ICD-10-CM

## 2018-06-21 LAB — POCT EXHALED NITRIC OXIDE: FeNO level (ppb): 11

## 2018-06-21 MED ORDER — ALBUTEROL SULFATE HFA 108 (90 BASE) MCG/ACT IN AERS: 2.0000 | INHALATION_SPRAY | Freq: Four times a day (QID) | RESPIRATORY_TRACT | 12 refills | Status: DC | PRN

## 2018-06-21 MED ORDER — LEVALBUTEROL HCL 0.63 MG/3ML IN NEBU
0.6300 mg | INHALATION_SOLUTION | Freq: Once | RESPIRATORY_TRACT | Status: AC
  Administered 2018-06-21: 0.63 mg via RESPIRATORY_TRACT

## 2018-06-21 NOTE — Progress Notes (Signed)
-HPI  female never smoker(prolonged second hand smoke) followed for chronic bronchitis, complicated by right breast cancer/XRT, GERD Office Spirometry 10/16/2017-moderate restriction of exhaled volume without obvious obstruction.  FVC 1.57/68%, FEV1 1.27/73%, ratio 0.81, FEF 25-75% 1.37/88% Echocardiogram EF 42-59, grade 1 diastolic dysfunction PFT 5/63/8756-EPPI restriction, minimal diffusion defect.  FVC 1.72/71%, FEV1 1.53/85%, ratio 0.89, TLC 69%, DLCO 74% Labs 11/17/2017-IgE 241 H, Eos  0.5, BNP 69 FENO 06/20/2018-- 11 WNL --------------------------------------------------------------  12/18/2017- 73 year old female never smoker(prolonged second hand smoke) followed for chronic bronchitis, complicated by right breast cancer/XRT, GERD -----Chronic Bronchitis: Pt completed PFT today. Pt feels that Trelegy is working well. Slight cough without change.  She is not aware of reflux now, but was a significant problem years ago.  Cough-productive at times but not as bad as it has been in past.  Feels Trelegy works significantly better than Breo for her.  Little reflux-does not wake choking. Reviewed chest x-rays showing persistent cardiac enlargement. PFT results reviewed with her. CXR 11/17/2017- IMPRESSION: 1.  No acute pulmonary disease. 2.  Cardiomegaly. PFT 12/18/2017-mild restriction, minimal diffusion defect.  FVC 1.72/71%, FEV1 1.53/85%, ratio 0.89, TLC 69%, DLCO 74%  06/21/2018 - 73 year old female never smoker(prolonged second hand smoke) followed for chronic bronchitis, complicated by right breast cancer/XRT, GERD -----COPD: Pt has cough-productive at times-thick and yellow in color.  Albuterol HFA, Trelegy, Cough productive yellow sputum routinely. Denies fever, blood, acute change. Acute bronchitis in September treated by PCP.  Notices shortness of breath only with coughing.  Denies night sweats.  Occasional mild wheeze. Not aware of reflux now but it was a significant problem years  ago. Labs 11/17/2017-IgE 241 H, Eos  0.5, BNP 69 FENO 06/20/2018-- 11 WNL  ROS-see HPI  + = positive Constitutional:   No-   weight loss, night sweats, fevers, chills, fatigue, lassitude. HEENT:   No-  headaches, difficulty swallowing, tooth/dental problems, sore throat,       No-  sneezing, itching, ear ache, nasal congestion, post nasal drip,  CV:  No-   chest pain, orthopnea, PND, swelling in lower extremities, anasarca,                                     dizziness, palpitations Resp: +shortness of breath with exertion or at rest.             +productive cough,  + non-productive cough,  No- coughing up of blood.            +change in color of mucus.   +wheezing.   Skin: No-   rash or lesions. GI:  No-   heartburn, indigestion, abdominal pain, nausea, vomiting, diarrhea,                 change in bowel habits, loss of appetite GU:  MS:  No-   joint pain or swelling.   Neuro-     nothing unusual Psych:  No- change in mood or affect. No depression or anxiety.  No memory loss.  OBJ- Physical Exam General- Alert, Oriented, Affect-appropriate, Distress- none acute, + obese Skin- rash-none, lesions- none, excoriation- none Lymphadenopathy- none Head- atraumatic            Eyes- Gross vision intact, PERRLA, conjunctivae and secretions clear            Ears- Hearing, canals-normal            Nose- Clear, no-Septal dev, mucus,  polyps, erosion, perforation             Throat- Mallampati III , mucosa clear , drainage- none, tonsils- atrophic Neck- flexible , trachea midline, no stridor , thyroid nl, carotid no bruit Chest - symmetrical excursion , unlabored           Heart/CV- RRR , no murmur , no gallop  , no rub, nl s1 s2                           - JVD- none , edema- none, stasis changes- none, varices- none           Lung- + clear, wheeze-none, cough+raspy  , dullness-none, rub- none           Chest wall-  +Hx R lumpectomy/XRT Abd-  Br/ Gen/ Rectal- Not done, not indicated Extrem-  cyanosis- none, clubbing, none, atrophy- none, strength- nl Neuro- grossly intact to observation

## 2018-06-21 NOTE — Patient Instructions (Signed)
Order- neb xop 0.63    Dx  Chronic bronchitis  Order- sputum cx  Routine, fungal and flurochrome/ [employer]  Order- flu vax  Senior  Refill script sent for albuterol rescue inhaler

## 2018-06-24 NOTE — Assessment & Plan Note (Signed)
Chronic bronchitis pattern.  Reflux precautions were emphasized although she does not recognize this happening. Plan-sputum culture, flu vaccine, refill rescue inhaler, continue Trelegy

## 2018-06-24 NOTE — Assessment & Plan Note (Signed)
Reflux precautions 

## 2018-06-25 ENCOUNTER — Other Ambulatory Visit: Payer: Medicare Other

## 2018-06-25 DIAGNOSIS — J449 Chronic obstructive pulmonary disease, unspecified: Secondary | ICD-10-CM

## 2018-07-17 ENCOUNTER — Ambulatory Visit (INDEPENDENT_AMBULATORY_CARE_PROVIDER_SITE_OTHER): Payer: Medicare Other | Admitting: Neurology

## 2018-07-17 ENCOUNTER — Encounter: Payer: Self-pay | Admitting: Neurology

## 2018-07-17 VITALS — BP 148/86 | HR 101 | Ht 58.5 in | Wt 167.0 lb

## 2018-07-17 DIAGNOSIS — B0229 Other postherpetic nervous system involvement: Secondary | ICD-10-CM | POA: Diagnosis not present

## 2018-07-17 DIAGNOSIS — R200 Anesthesia of skin: Secondary | ICD-10-CM

## 2018-07-17 MED ORDER — GABAPENTIN 600 MG PO TABS: ORAL_TABLET | ORAL | 11 refills | Status: DC

## 2018-07-17 MED ORDER — NORTRIPTYLINE HCL 10 MG PO CAPS: ORAL_CAPSULE | ORAL | 6 refills | Status: DC

## 2018-07-17 NOTE — Patient Instructions (Signed)
1. Increase nortriptyline 10mg : Take 3 capsules every night for a week and see how you feel. If no change, increase to 4 capsules every night for a week. Again see how you feel, we can increase to 50mg  every night, at which point we will change the prescription to a 50mg  capsule  2. Continue gabapentin 600mg : take 2 tablets three times a day  3. Follow-up in 5-6 months, call for any changes

## 2018-07-17 NOTE — Progress Notes (Signed)
NEUROLOGY FOLLOW UP OFFICE NOTE  Katie Zimmerman [id number] 09-21-1945  HISTORY OF PRESENT ILLNESS: I had the pleasure of seeing Katie Zimmerman in follow-up in the neurology clinic on 07/17/2018.  The patient was last seen 6 months ago for postherpetic neuralgia. She is alone in the office today. Records and images were personally reviewed where available. I personally reviewed MRI face trigeminal protocol with and without contrast, which showed poorly demonstrated right cisternal segment of trigeminal nerve, no abnormal enhancement, unchanged from prior MRI in 2014, differential diagnosis includes anomalous course, edema, developmental variant or old injury. She is currently on gabapentin 1200mg  TID, it was ineffective so nortriptyline 20mg  qhs was added in September. She reports a good response to this addition, she was doing pretty good where it was not bothering her (but not 0/10), every once in a while there would be a pressure point on the right side of her nose and temple. Unfortunately, her husband was admitted to the hospital for a month in November-December 2019, and her pain worsened again. Pain is worse at night. Usually pain is an 8/10, sometimes going to a 10/10 with pain around her cheek. It is hard to chew or brush her teeth on that side, difficult to sleep.   History on Initial Assessment 01/15/2018: This is a pleasant 73 year old right-handed woman with a history of breast cancer, presenting for postherpetic neuralgia. She had shingles affecting the right side of her face in 2000. She did well for a year or 2 with no symptoms then started having significant pain in the same distribution. She describes pain as pressure and tenderness when she washes or touches her cheek on the right side of her nose. It is painful to talk, with pain radiating from the right temporal region down the right side of her tongue. It is hard to eat and talk. Bending down seems to worsen pain. She does have a diagnosis of  TMJ on the right as well. She was tried on different medications in the past few years and found gabapentin helped for a while, then stopped working. She tried Lyrica several years ago which also helped for a little while, then she saw a neurologist who increased to the dose to 200mg  but she never filled the higher dose because she developed kidney stones in 2011. Around that time, the pain was not bothering her so much, but when pain recurred, she was restarted on gabapentin which worked pretty good until recently. For the past few months, she has had a constant 10/10 pain (appears comfortable in the office today), when she has sharp pains, the pain would be more than a 10. Pain is worse at night, really bad around 9pm. She takes her gabapentin and can sleep. The last few days she has increased gabapentin 300mg  taking 3 in AM, 2 at supper, 3 at bedtime. No drowsiness. She denies any tinnitus but feels she has lost some hearing in the right ear. No vision changes. For a time she was having headaches with pressure over the vertex, but none lately. No nausea/vomiting, dizziness, diplopia, dysarthria/dysphagia, neck pain, focal numbness/tingling/weakness in the extremities, bowel/bladder dysfunction. Left side of face is unaffected. She has chronic back pain.   PAST MEDICAL HISTORY: Past Medical History:  Diagnosis Date  . Breast cancer ([place]) 12/2011  . Breast cancer ([place]) 12/2011   DCIS  . Complication of anesthesia    hard to wake up age 79-not since  . Fibromyalgia   . GERD (  gastroesophageal reflux disease)   . History of radiation therapy 02/29/12 -04/21/12   right breast, 5040 cGy in 28 fx, boost to total 6240 cGy  . Osteoporosis   . Personal history of colonic polyps-adenomas 05/25/2012  . Personal history of radiation therapy   . PHN (postherpetic neuralgia) 2000   Right side of face    MEDICATIONS: Current Outpatient Medications on File Prior to Visit  Medication Sig Dispense Refill  .  acetaminophen (TYLENOL) 500 MG tablet Take 1,000 mg by mouth every 4 (four) hours as needed for mild pain, moderate pain or headache.     . albuterol (PROVENTIL HFA;VENTOLIN HFA) 108 (90 Base) MCG/ACT inhaler Inhale 2 puffs into the lungs every 6 (six) hours as needed for wheezing or shortness of breath. 1 Inhaler 12  . cholecalciferol (VITAMIN D) 1000 units tablet Take 4,000 Units by mouth daily.    . Fluticasone-Umeclidin-Vilant (TRELEGY ELLIPTA) 100-62.5-25 MCG/INH AEPB Inhale 1 puff into the lungs daily. Rinse mouth. 1 each 12  . gabapentin (NEURONTIN) 600 MG tablet Take 2 tablets three times a day 180 tablet 11  . nortriptyline (PAMELOR) 10 MG capsule Take 1 capsule each night for 1 week, then increase to 2 capsules each night. 60 capsule 6   No current facility-administered medications on file prior to visit.     ALLERGIES: Allergies  Allergen Reactions  . Penicillins Anaphylaxis, Rash and Other (See Comments)    Has patient had a PCN reaction causing immediate rash, facial/tongue/throat swelling, SOB or lightheadedness with hypotension: Yes Has patient had a PCN reaction causing severe rash involving mucus membranes or skin necrosis: No Has patient had a PCN reaction that required hospitalization No Has patient had a PCN reaction occurring within the last 10 years: No If all of the above answers are "NO", then may proceed with Cephalosporin use.  . Venlafaxine Nausea Only  . Sulfonamide Derivatives Rash    FAMILY HISTORY: Family History  Problem Relation Age of Onset  . Hypertension Mother   . Cancer Mother        LIVER cancer  . Hypertension Father   . Diabetes Father   . Heart failure Father   . Breast cancer Sister 15       NOT hormone receptor breast cancer  . Breast cancer Maternal Aunt 75  . Breast cancer Other        dx in her 8s  . Breast cancer Cousin 67       maternal cousin  . Breast cancer Cousin        maternal cousin  . Breast cancer Cousin         maternal cousin  . Cancer Cousin        unknown form of cancer; maternal cousin  . Cancer Cousin        unknown form of cancer; maternal cousin  . Cancer Cousin 11       ? lung cancer; maternal cousin  . Breast cancer Cousin        dx 78s-50s; paternal cousin  . Breast cancer Cousin        dx in her 23s; paternal cousin  . Colon cancer Neg Hx   . Stomach cancer Neg Hx     SOCIAL HISTORY: Social History   Socioeconomic History  . Marital status: Married    Spouse name: Not on file  . Number of children: 2  . Years of education: Not on file  . Highest education level: Not on file  Occupational History  . Occupation: CONSULTANT    Employer: [employer]: Retired  Scientific laboratory technician  . Financial resource strain: Not on file  . Food insecurity:    Worry: Not on file    Inability: Not on file  . Transportation needs:    Medical: Not on file    Non-medical: Not on file  Tobacco Use  . Smoking status: Never Smoker  . Smokeless tobacco: Never Used  Substance and Sexual Activity  . Alcohol use: Yes    Comment: occasional glass of wine  . Drug use: No  . Sexual activity: Not Currently  Lifestyle  . Physical activity:    Days per week: Not on file    Minutes per session: Not on file  . Stress: Not on file  Relationships  . Social connections:    Talks on phone: Not on file    Gets together: Not on file    Attends religious service: Not on file    Active member of club or organization: Not on file    Attends meetings of clubs or organizations: Not on file    Relationship status: Not on file  . Intimate partner violence:    Fear of current or ex partner: Not on file    Emotionally abused: Not on file    Physically abused: Not on file    Forced sexual activity: Not on file  Other Topics Concern  . Not on file  Social History Narrative   Reviewed 12/2013   Regular exercise-yes-intermittantly at [facility] gym   Diet: fruits and veggies    REVIEW OF  SYSTEMS: Constitutional: No fevers, chills, or sweats, no generalized fatigue, change in appetite Eyes: No visual changes, double vision, eye pain Ear, nose and throat: No hearing loss, ear pain, nasal congestion, sore throat Cardiovascular: No chest pain, palpitations Respiratory:  No shortness of breath at rest or with exertion, wheezes GastrointestinaI: No nausea, vomiting, diarrhea, abdominal pain, fecal incontinence Genitourinary:  No dysuria, urinary retention or frequency Musculoskeletal:  No neck pain, back pain Integumentary: No rash, pruritus, skin lesions Neurological: as above Psychiatric: No depression, insomnia, anxiety Endocrine: No palpitations, fatigue, diaphoresis, mood swings, change in appetite, change in weight, increased thirst Hematologic/Lymphatic:  No anemia, purpura, petechiae. Allergic/Immunologic: no itchy/runny eyes, nasal congestion, recent allergic reactions, rashes  PHYSICAL EXAM: Vitals:   07/17/18 1425  BP: (!) 148/86  Pulse: (!) 101  SpO2: 98%   General: No acute distress Head:  Normocephalic/atraumatic Neck: supple, no paraspinal tenderness, full range of motion Heart:  Regular rate and rhythm Lungs:  Clear to auscultation bilaterally Back: No paraspinal tenderness Skin/Extremities: No rash, no edema Neurological Exam: alert and oriented to person, place, and time. No aphasia or dysarthria. Fund of knowledge is appropriate.  Recent and remote memory are intact.  Attention and concentration are normal.    Able to name objects and repeat phrases. Cranial nerves: Pupils equal, round, reactive to light. Extraocular movements intact with no nystagmus. Visual fields full. Decreased light touch on right V2. No facial asymmetry. Tongue, uvula, palate midline.  Motor: Bulk and tone normal, muscle strength 5/5 throughout with no pronator drift.  Sensation to light touch intact on all extremities.  No extinction to double simultaneous stimulation.  Finger to  nose testing intact.  Gait narrow-based and steady, able to tandem walk adequately.    IMPRESSION: This is a pleasant 73 yo RH woman with a history of breast cancer with worsening right-sided post-herpetic neuralgia.  She also has numbness on the right side of her face. MRI shows poorly demonstrated right cisternal segment of trigeminal nerve, no abnormal enhancement, unchanged from prior MRI in 2014, differential diagnosis includes anomalous course, edema, developmental variant or old injurypresenting for worsening right facial pain. She had initial good response to addition of nortriptyline to gabapentin, but had increased pain in the setting of stress with her husband's prolonged hospitalization. We discussed increasing nortriptyline every week by 10mg  up to 50mg  qhs and assess response. If she needs 50mg  dose, a new prescription for 50mg  qhs will be sent. Continue gabapentin 1200mg  TID for now. Follow-up in 5-6 months, she knows to call for any changes.   Thank you for allowing me to participate in her care.  Please do not hesitate to call for any questions or concerns.  The duration of this appointment visit was 27 minutes of face-to-face time with the patient.  Greater than 50% of this time was spent in counseling, explanation of diagnosis, planning of further management, and coordination of care.   Ellouise Newer, M.D.   CC: Dr. Diona Browner

## 2018-07-23 ENCOUNTER — Encounter: Payer: Self-pay | Admitting: Neurology

## 2018-07-25 ENCOUNTER — Ambulatory Visit (INDEPENDENT_AMBULATORY_CARE_PROVIDER_SITE_OTHER): Payer: Medicare Other | Admitting: Family Medicine

## 2018-07-25 ENCOUNTER — Encounter: Payer: Self-pay | Admitting: Family Medicine

## 2018-07-25 VITALS — BP 160/92 | HR 81 | Temp 98.2°F | Ht 59.0 in | Wt 168.8 lb

## 2018-07-25 DIAGNOSIS — J209 Acute bronchitis, unspecified: Secondary | ICD-10-CM | POA: Diagnosis not present

## 2018-07-25 DIAGNOSIS — J440 Chronic obstructive pulmonary disease with acute lower respiratory infection: Secondary | ICD-10-CM

## 2018-07-25 DIAGNOSIS — J441 Chronic obstructive pulmonary disease with (acute) exacerbation: Secondary | ICD-10-CM | POA: Diagnosis not present

## 2018-07-25 MED ORDER — PREDNISONE 20 MG PO TABS: ORAL_TABLET | ORAL | 0 refills | Status: DC

## 2018-07-25 MED ORDER — AZITHROMYCIN 250 MG PO TABS: ORAL_TABLET | ORAL | 0 refills | Status: AC

## 2018-07-25 NOTE — Progress Notes (Signed)
Dr. Frederico Hamman T. Ileta Ofarrell, MD, [facility] [place], [address] Phone: [phone] Fax: [phone]  07/25/2018  Patient: Katie Zimmerman, MRN: [id number], DOB: [birth year], 73 y.o.  Primary Physician:  [name], MD   Chief Complaint  Patient presents with  . Cough  . Generalized Body Aches  . Nasal Congestion   Subjective:   Katie Zimmerman is a 73 y.o. very pleasant female patient who presents with the following:  Patient with COPD here for acute visit.  2-3 weeks ago and saw Dr. Annamaria Boots 1/23 and had some achiness and flu-like symptoms after then.  Normally she does cough.  Cough is a lot worse. Has not felt well for 2-3 weeks.    Has been wheezing some. Noticed more recently.  She is not entirely sure, but she does not think she is really been significantly short of breath, but she has been having some daily wheezing. Has taken some sudafed.   She still has a productive cough, this is significantly worse compared to her baseline, and it is productive of yellowish-greenish sputum.  She also is having a lot of nasal congestion recently.  Past Medical History, Surgical History, Social History, Family History, Problem List, Medications, and Allergies have been reviewed and updated if relevant.  Patient Active Problem List   Diagnosis Date Noted  . Cough, persistent 10/17/2017  . Headache 02/21/2017  . COPD exacerbation ([place]) 12/30/2016  . BMI 33.0-33.9,adult 08/09/2016  . Increased endometrial stripe thickness 03/18/2016  . [place] arthritis 01/22/2015  . Counseling regarding end of life decision making 01/22/2015  . Osteoporosis 01/28/2014  . Hearing loss in right ear 01/20/2014  . Acute bronchitis with COPD ([place]) 12/24/2012  . Personal history of colonic polyps-adenomas 05/25/2012  . GERD (gastroesophageal reflux disease)   . History of radiation therapy   . Cancer of right breast, stage 0 01/26/2012  . Prediabetes  11/22/2011  . High cholesterol 11/22/2011  . TEMPOROMANDIBULAR JOINT PAIN 11/04/2009  . COPD (chronic obstructive pulmonary disease) with chronic bronchitis ([place]) 05/04/2009  . RENAL CALCULUS 05/04/2009  . Post herpetic neuralgia 04/14/2009    Past Medical History:  Diagnosis Date  . Breast cancer ([place]) 12/2011  . Breast cancer ([place]) 12/2011   DCIS  . Complication of anesthesia    hard to wake up age 73-not since  . Fibromyalgia   . GERD (gastroesophageal reflux disease)   . History of radiation therapy 02/29/12 -04/21/12   right breast, 5040 cGy in 28 fx, boost to total 6240 cGy  . Osteoporosis   . Personal history of colonic polyps-adenomas 05/25/2012  . Personal history of radiation therapy   . PHN (postherpetic neuralgia) 2000   Right side of face    Past Surgical History:  Procedure Laterality Date  . APPENDECTOMY    . BIOPSY BREAST  01/10/12   Right breast needle cor biopsy  . BREAST BIOPSY  01/10/2012  . BREAST CYST ASPIRATION  01/10/2012  . BREAST LUMPECTOMY Right 01/26/2012   Right/ERPR+ DCIS, right ax snbx  . FOOT SURGERY  1996   rt/lt great toes  . GLAUCOMA SURGERY  1993   laser  . OVARIAN CYST SURGERY  1964  . TUBAL LIGATION  1976    Social History   Socioeconomic History  . Marital status: Married    Spouse name: Not on file  . Number of children: 2  . Years of education: Not on file  .  Highest education level: Not on file  Occupational History  . Occupation: CONSULTANT    Employer: [employer]: Retired  Scientific laboratory technician  . Financial resource strain: Not on file  . Food insecurity:    Worry: Not on file    Inability: Not on file  . Transportation needs:    Medical: Not on file    Non-medical: Not on file  Tobacco Use  . Smoking status: Never Smoker  . Smokeless tobacco: Never Used  Substance and Sexual Activity  . Alcohol use: Yes    Comment: occasional glass of wine  . Drug use: No  . Sexual activity: Not Currently  Lifestyle  .  Physical activity:    Days per week: Not on file    Minutes per session: Not on file  . Stress: Not on file  Relationships  . Social connections:    Talks on phone: Not on file    Gets together: Not on file    Attends religious service: Not on file    Active member of club or organization: Not on file    Attends meetings of clubs or organizations: Not on file    Relationship status: Not on file  . Intimate partner violence:    Fear of current or ex partner: Not on file    Emotionally abused: Not on file    Physically abused: Not on file    Forced sexual activity: Not on file  Other Topics Concern  . Not on file  Social History Narrative   Reviewed 12/2013   Regular exercise-yes-intermittantly at [facility] gym   Diet: fruits and veggies    Family History  Problem Relation Age of Onset  . Hypertension Mother   . Cancer Mother        LIVER cancer  . Hypertension Father   . Diabetes Father   . Heart failure Father   . Breast cancer Sister 4       NOT hormone receptor breast cancer  . Breast cancer Maternal Aunt 75  . Breast cancer Other        dx in her 58s  . Breast cancer Cousin 88       maternal cousin  . Breast cancer Cousin        maternal cousin  . Breast cancer Cousin        maternal cousin  . Cancer Cousin        unknown form of cancer; maternal cousin  . Cancer Cousin        unknown form of cancer; maternal cousin  . Cancer Cousin 11       ? lung cancer; maternal cousin  . Breast cancer Cousin        dx 29s-50s; paternal cousin  . Breast cancer Cousin        dx in her 91s; paternal cousin  . Colon cancer Neg Hx   . Stomach cancer Neg Hx     Allergies  Allergen Reactions  . Penicillins Anaphylaxis, Rash and Other (See Comments)    Has patient had a PCN reaction causing immediate rash, facial/tongue/throat swelling, SOB or lightheadedness with hypotension: Yes Has patient had a PCN reaction causing severe rash involving mucus membranes or skin necrosis:  No Has patient had a PCN reaction that required hospitalization No Has patient had a PCN reaction occurring within the last 10 years: No If all of the above answers are "NO", then may proceed with Cephalosporin use.  . Venlafaxine  Nausea Only  . Sulfonamide Derivatives Rash    Medication list reviewed and updated in full in [facility].  ROS: GEN: Acute illness details above GI: Tolerating PO intake GU: maintaining adequate hydration and urination Pulm: No SOB Interactive and getting along well at home.  Otherwise, ROS is as per the HPI.  Objective:   BP (!) 160/92   Pulse 81   Temp 98.2 F (36.8 C) (Oral)   Ht 4\' 11"  (1.499 m)   Wt 168 lb 12 oz (76.5 kg)   SpO2 97%   BMI 34.08 kg/m    GEN: A and O x 3. WDWN. NAD.    ENT: Nose clear, ext NML.  No LAD.  No JVD.  TM's clear. Oropharynx clear.  PULM: Normal WOB, no distress. No crackles, scattered b wheezes, no rhonchi. CV: RRR, no M/G/R, No rubs, No JVD.   EXT: warm and well-perfused, No c/c/e. PSYCH: Pleasant and conversant.    Laboratory and Imaging Data:  Assessment and Plan:   COPD exacerbation ([place])  Acute bronchitis with COPD ([place])  COPD exac with 2-3 weeks of acute illness  Follow-up: No follow-ups on file.  Meds ordered this encounter  Medications  . azithromycin (ZITHROMAX) 250 MG tablet    Sig: Take 2 tablets (500 mg total) by mouth daily for 1 day, THEN 1 tablet (250 mg total) daily for 4 days.    Dispense:  6 tablet    Refill:  0  . predniSONE (DELTASONE) 20 MG tablet    Sig: 2 tabs po daily for 5 days, then 1 tab po daily for 5 days    Dispense:  15 tablet    Refill:  0   No orders of the defined types were placed in this encounter.   Signed,  Maud Deed. Forrest Jaroszewski, MD   Outpatient Encounter Medications as of 07/25/2018  Medication Sig  . acetaminophen (TYLENOL) 500 MG tablet Take 1,000 mg by mouth every 4 (four) hours as needed for mild pain, moderate pain or headache.   .  albuterol (PROVENTIL HFA;VENTOLIN HFA) 108 (90 Base) MCG/ACT inhaler Inhale 2 puffs into the lungs every 6 (six) hours as needed for wheezing or shortness of breath.  . cholecalciferol (VITAMIN D) 1000 units tablet Take 4,000 Units by mouth daily.  . Fluticasone-Umeclidin-Vilant (TRELEGY ELLIPTA) 100-62.5-25 MCG/INH AEPB Inhale 1 puff into the lungs daily. Rinse mouth.  . gabapentin (NEURONTIN) 600 MG tablet Take 2 tablets three times a day  . nortriptyline (PAMELOR) 10 MG capsule Take 4 capsules every night  . azithromycin (ZITHROMAX) 250 MG tablet Take 2 tablets (500 mg total) by mouth daily for 1 day, THEN 1 tablet (250 mg total) daily for 4 days.  . predniSONE (DELTASONE) 20 MG tablet 2 tabs po daily for 5 days, then 1 tab po daily for 5 days   No facility-administered encounter medications on file as of 07/25/2018.

## 2018-08-08 DIAGNOSIS — H524 Presbyopia: Secondary | ICD-10-CM | POA: Diagnosis not present

## 2018-08-08 LAB — HM DIABETES EYE EXAM

## 2018-08-10 LAB — RESPIRATORY CULTURE OR RESPIRATORY AND SPUTUM CULTURE
MICRO NUMBER:: 108901
RESULT:: NORMAL
SPECIMEN QUALITY:: ADEQUATE

## 2018-08-10 LAB — MYCOBACTERIA,CULT W/FLUOROCHROME SMEAR
MICRO NUMBER:: 108900
SMEAR:: NONE SEEN
SPECIMEN QUALITY:: ADEQUATE

## 2018-08-10 LAB — FUNGUS CULTURE W SMEAR
MICRO NUMBER:: 108899
SMEAR:: NONE SEEN
SPECIMEN QUALITY:: ADEQUATE

## 2018-08-13 ENCOUNTER — Telehealth: Payer: Self-pay | Admitting: Internal Medicine

## 2018-08-13 NOTE — Telephone Encounter (Signed)
Pt still asked to speak to [facility], will route to her.

## 2018-08-13 NOTE — Telephone Encounter (Signed)
Advised pt of results. Pt understood and nothing further is needed.    Notes recorded by Deneise Lever, MD on 08/12/2018 at 8:32 PM EDT Sputum cultures from 08/10/2018- Only normal airway organisms identified

## 2018-08-14 NOTE — Telephone Encounter (Signed)
LVM for pt to return call this morning. X1

## 2018-08-14 NOTE — Telephone Encounter (Signed)
Patient returned phone call, made aware of results regarding sputum results. Voiced understanding. Nothing further is needed at this time.

## 2018-08-15 ENCOUNTER — Other Ambulatory Visit: Payer: Self-pay | Admitting: Neurology

## 2018-08-15 MED ORDER — NORTRIPTYLINE HCL 50 MG PO CAPS: ORAL_CAPSULE | ORAL | 11 refills | Status: DC

## 2018-08-16 ENCOUNTER — Telehealth: Payer: Self-pay | Admitting: Internal Medicine

## 2018-08-16 NOTE — Telephone Encounter (Signed)
Called and spoke with patient, she stated that she didn't realize this was a duplicate call. Nothing further needed.    August 14, 2018  Vivia Ewing, LPN       8:24 PM  Note    Patient returned phone call, made aware of results regarding sputum results. Voiced understanding. Nothing further is needed at this time

## 2018-10-15 ENCOUNTER — Telehealth: Payer: Self-pay | Admitting: Neurology

## 2018-10-15 NOTE — Telephone Encounter (Signed)
Patient stated she was returning your call Katie Zimmerman. Thanks!

## 2018-10-17 ENCOUNTER — Telehealth (INDEPENDENT_AMBULATORY_CARE_PROVIDER_SITE_OTHER): Payer: Medicare Other | Admitting: Neurology

## 2018-10-17 ENCOUNTER — Other Ambulatory Visit: Payer: Self-pay

## 2018-10-17 DIAGNOSIS — B0229 Other postherpetic nervous system involvement: Secondary | ICD-10-CM | POA: Diagnosis not present

## 2018-10-17 MED ORDER — GABAPENTIN 600 MG PO TABS: ORAL_TABLET | ORAL | 11 refills | Status: DC

## 2018-10-17 MED ORDER — NORTRIPTYLINE HCL 50 MG PO CAPS: ORAL_CAPSULE | ORAL | 11 refills | Status: DC

## 2018-10-17 NOTE — Progress Notes (Signed)
Virtual Visit via Telephone Note The purpose of this virtual visit is to provide medical care while limiting exposure to the novel coronavirus.    Consent was obtained for phone visit:  Yes.   Answered questions that patient had about telehealth interaction:  Yes.   I discussed the limitations, risks, security and privacy concerns of performing an evaluation and management service by telephone. I also discussed with the patient that there may be a patient responsible charge related to this service. The patient expressed understanding and agreed to proceed.  Pt location: Home Physician Location: office Name of referring provider:  [name], MD I connected with .Katie Zimmerman at patients initiation/request on 10/17/2018 at  9:00 AM EDT by telephone and verified that I am speaking with the correct person using two identifiers.  Pt MRN:  [id number] Pt DOB:  Oct 02, 1945   History of Present Illness:  The patient was last seen in February 2020 for postherpetic neuralgia. She has flares every few months, usually during periods of increased stress. On her last visit, her husband had been in the hospital and she had an increase in facial pain. Nortriptyline dose was increased to 100mg  qhs in addition to gabapentin 1200mg  TID. She did pretty well with a flare every once in a while that would go away, however this past week she has had significant pain also affecting her tongue, making it hard to talk or eat. The pain goes right through her lip and right jaw, there is a pressure point at her right ear and cheek. Last night washing her face was very painful, it calmed down a little with applying a warm wash cloth. She reports her husband fell last Saturday and she was the only one helping him, which has been stressful, however pain started prior to this. She denies any side effects on current medications.   History on Initial Assessment 01/15/2018: This is a pleasant 73 year old right-handed woman with a  history of breast cancer, presenting for postherpetic neuralgia. She had shingles affecting the right side of her face in 2000. She did well for a year or 2 with no symptoms then started having significant pain in the same distribution. She describes pain as pressure and tenderness when she washes or touches her cheek on the right side of her nose. It is painful to talk, with pain radiating from the right temporal region down the right side of her tongue. It is hard to eat and talk. Bending down seems to worsen pain. She does have a diagnosis of TMJ on the right as well. She was tried on different medications in the past few years and found gabapentin helped for a while, then stopped working. She tried Lyrica several years ago which also helped for a little while, then she saw a neurologist who increased to the dose to 200mg  but she never filled the higher dose because she developed kidney stones in 2011. Around that time, the pain was not bothering her so much, but when pain recurred, she was restarted on gabapentin which worked pretty good until recently. For the past few months, she has had a constant 10/10 pain (appears comfortable in the office today), when she has sharp pains, the pain would be more than a 10. Pain is worse at night, really bad around 9pm. She takes her gabapentin and can sleep. The last few days she has increased gabapentin 300mg  taking 3 in AM, 2 at supper, 3 at bedtime. No drowsiness.  She denies any tinnitus but feels she has lost some hearing in the right ear. No vision changes. For a time she was having headaches with pressure over the vertex, but none lately. No nausea/vomiting, dizziness, diplopia, dysarthria/dysphagia, neck pain, focal numbness/tingling/weakness in the extremities, bowel/bladder dysfunction. Left side of face is unaffected. She has chronic back pain.    Observations/Objective:  Limited by nature of phone visit. Patient is awake, alert, able to speak coherently, not  in acute distress at this time.  Assessment and Plan:   This is a pleasant 73 yo RH woman with a history of breast cancer with right-sided post-herpetic neuralgia that flares up every few months, presenting for another flare. MRI had shown poorly demonstrated right cisternal segment of trigeminal nerve, no abnormal enhancement, unchanged from prior MRI in 2014, differential diagnosis includes anomalous course, edema, developmental variant or old injurypresenting for worsening right facial pain. She has had good response to nortriptyline in addition to gabapentin, we discussed increasing nortriptyline to 150mg  qhs, continue gabapentin 1200mg  TID. Side effects were discussed. She will contact our office in a week if no response to pain or if having side effects, we can try a brief course of prn Tramadol. Follow-up as scheduled in August 2020, all her questions were answered, she knows to call for any changes.   Follow Up Instructions: -I discussed the assessment and treatment plan with the patient. The patient was provided an opportunity to ask questions and all were answered. The patient agreed with the plan and demonstrated an understanding of the instructions.   The patient was advised to call back or seek an in-person evaluation if the symptoms worsen or if the condition fails to improve as anticipated.  Total Time spent in visit with the patient was 20 minutes, of which 100% of the time was spent in counseling and/or coordinating care on the above.   Pt understands and agrees with the plan of care outlined.     Cameron Sprang, MD

## 2018-10-19 DIAGNOSIS — H5712 Ocular pain, left eye: Secondary | ICD-10-CM | POA: Diagnosis not present

## 2018-10-19 LAB — HM DIABETES EYE EXAM

## 2018-11-21 MED ORDER — TRAMADOL HCL 50 MG PO TABS: ORAL_TABLET | ORAL | 0 refills | Status: DC

## 2018-11-30 NOTE — Progress Notes (Signed)
No charge for AWV. Labs only.

## 2018-12-17 ENCOUNTER — Encounter: Payer: Self-pay | Admitting: Family Medicine

## 2018-12-17 ENCOUNTER — Ambulatory Visit (INDEPENDENT_AMBULATORY_CARE_PROVIDER_SITE_OTHER): Payer: Medicare Other | Admitting: Family Medicine

## 2018-12-17 VITALS — Temp 98.2°F

## 2018-12-17 DIAGNOSIS — R05 Cough: Secondary | ICD-10-CM | POA: Diagnosis not present

## 2018-12-17 DIAGNOSIS — R059 Cough, unspecified: Secondary | ICD-10-CM

## 2018-12-17 MED ORDER — BENZONATATE 100 MG PO CAPS: 100.0000 mg | ORAL_CAPSULE | Freq: Two times a day (BID) | ORAL | 0 refills | Status: DC | PRN

## 2018-12-17 NOTE — Progress Notes (Signed)
I connected with Katie Zimmerman on 12/17/18 at 10:40 AM EDT by video and verified that I am speaking with the correct person using two identifiers.   I discussed the limitations, risks, security and privacy concerns of performing an evaluation and management service by video and the availability of in person appointments. I also discussed with the patient that there may be a patient responsible charge related to this service. The patient expressed understanding and agreed to proceed.  Patient location: Home Provider Location: [place] Participants: Lesleigh Noe and Katie Zimmerman   Subjective:     Katie Zimmerman is a 73 y.o. female presenting for Cough (x 2 days. Had laryngitis from 7/14/-7/16)     Cough This is a new problem. The current episode started yesterday. The problem has been unchanged. The cough is productive of purulent sputum. Associated symptoms include headaches, myalgias and postnasal drip. Pertinent negatives include no chills, ear congestion, ear pain, fever (98.2), nasal congestion, rhinorrhea, sore throat, shortness of breath or wheezing. Associated symptoms comments: Loss of voice. Nothing aggravates the symptoms. She has tried nothing for the symptoms. Her past medical history is significant for COPD.   Sick contact: no, husband was in the hospital for 2 weeks for kidney issue - but she did not go to the hospital  No large gatherings and wearing masks  Cough keeping her up at night  Review of Systems  Constitutional: Positive for fatigue. Negative for chills and fever (98.2).  HENT: Positive for postnasal drip. Negative for ear pain, rhinorrhea and sore throat.   Respiratory: Positive for cough. Negative for shortness of breath and wheezing.   Musculoskeletal: Positive for myalgias.  Neurological: Positive for headaches.     Social History   Tobacco Use  Smoking Status Never Smoker  Smokeless Tobacco Never Used        Objective:   BP  Readings from Last 3 Encounters:  07/25/18 (!) 160/92  07/17/18 (!) 148/86  06/21/18 124/78   Wt Readings from Last 3 Encounters:  10/16/18 162 lb (73.5 kg)  07/25/18 168 lb 12 oz (76.5 kg)  07/17/18 167 lb (75.8 kg)   Temp 98.2 F (36.8 C) Comment: per patient   Physical Exam Constitutional:      General: She is not in acute distress.    Appearance: She is ill-appearing. She is not toxic-appearing.  HENT:     Head: Normocephalic and atraumatic.     Right Ear: External ear normal.     Left Ear: External ear normal.  Eyes:     Conjunctiva/sclera: Conjunctivae normal.  Pulmonary:     Effort: Pulmonary effort is normal. No respiratory distress.     Comments: Occasional deep cough Neurological:     Mental Status: She is alert. Mental status is at baseline.  Psychiatric:        Mood and Affect: Mood normal.        Behavior: Behavior normal.        Thought Content: Thought content normal.        Judgment: Judgment normal.             Assessment & Plan:   Problem List Items Addressed This Visit    None    Visit Diagnoses    Cough    -  Primary   Relevant Medications   benzonatate (TESSALON) 100 MG capsule   Other Relevant Orders   Novel Coronavirus, NAA ([facility])     Discussed  OTC treatment for viral illness Given symptoms possible covid-19 and would recommend being tested ER precautions given Advised staying out of work and isolating until results have come back Referral to [facility] for testing made   Return if symptoms worsen or fail to improve.  Lesleigh Noe, MD

## 2018-12-19 ENCOUNTER — Telehealth: Payer: Self-pay

## 2018-12-19 ENCOUNTER — Other Ambulatory Visit: Payer: Self-pay

## 2018-12-19 ENCOUNTER — Encounter (HOSPITAL_COMMUNITY): Payer: Self-pay | Admitting: General Practice

## 2018-12-19 ENCOUNTER — Emergency Department (HOSPITAL_COMMUNITY): Payer: Medicare Other

## 2018-12-19 ENCOUNTER — Inpatient Hospital Stay (HOSPITAL_COMMUNITY)
Admission: EM | Admit: 2018-12-19 | Discharge: 2018-12-21 | DRG: 193 | Disposition: A | Discharge status: Home / Self Care | Payer: Medicare Other | Attending: Internal Medicine | Admitting: Internal Medicine

## 2018-12-19 DIAGNOSIS — Z923 Personal history of irradiation: Secondary | ICD-10-CM

## 2018-12-19 DIAGNOSIS — R05 Cough: Secondary | ICD-10-CM | POA: Diagnosis not present

## 2018-12-19 DIAGNOSIS — J440 Chronic obstructive pulmonary disease with acute lower respiratory infection: Secondary | ICD-10-CM | POA: Diagnosis not present

## 2018-12-19 DIAGNOSIS — J9601 Acute respiratory failure with hypoxia: Secondary | ICD-10-CM | POA: Diagnosis not present

## 2018-12-19 DIAGNOSIS — Z79891 Long term (current) use of opiate analgesic: Secondary | ICD-10-CM

## 2018-12-19 DIAGNOSIS — M810 Age-related osteoporosis without current pathological fracture: Secondary | ICD-10-CM | POA: Diagnosis present

## 2018-12-19 DIAGNOSIS — Z853 Personal history of malignant neoplasm of breast: Secondary | ICD-10-CM

## 2018-12-19 DIAGNOSIS — Z20828 Contact with and (suspected) exposure to other viral communicable diseases: Secondary | ICD-10-CM | POA: Diagnosis present

## 2018-12-19 DIAGNOSIS — R0602 Shortness of breath: Secondary | ICD-10-CM | POA: Diagnosis not present

## 2018-12-19 DIAGNOSIS — J449 Chronic obstructive pulmonary disease, unspecified: Secondary | ICD-10-CM

## 2018-12-19 DIAGNOSIS — Z8249 Family history of ischemic heart disease and other diseases of the circulatory system: Secondary | ICD-10-CM

## 2018-12-19 DIAGNOSIS — B0229 Other postherpetic nervous system involvement: Secondary | ICD-10-CM | POA: Diagnosis not present

## 2018-12-19 DIAGNOSIS — R0902 Hypoxemia: Secondary | ICD-10-CM | POA: Diagnosis not present

## 2018-12-19 DIAGNOSIS — Z880 Allergy status to penicillin: Secondary | ICD-10-CM | POA: Diagnosis not present

## 2018-12-19 DIAGNOSIS — K219 Gastro-esophageal reflux disease without esophagitis: Secondary | ICD-10-CM | POA: Diagnosis not present

## 2018-12-19 DIAGNOSIS — Z79899 Other long term (current) drug therapy: Secondary | ICD-10-CM

## 2018-12-19 DIAGNOSIS — J181 Lobar pneumonia, unspecified organism: Secondary | ICD-10-CM

## 2018-12-19 DIAGNOSIS — Z882 Allergy status to sulfonamides status: Secondary | ICD-10-CM | POA: Diagnosis not present

## 2018-12-19 DIAGNOSIS — J9690 Respiratory failure, unspecified, unspecified whether with hypoxia or hypercapnia: Secondary | ICD-10-CM | POA: Diagnosis present

## 2018-12-19 DIAGNOSIS — Z801 Family history of malignant neoplasm of trachea, bronchus and lung: Secondary | ICD-10-CM | POA: Diagnosis not present

## 2018-12-19 DIAGNOSIS — Z888 Allergy status to other drugs, medicaments and biological substances status: Secondary | ICD-10-CM | POA: Diagnosis not present

## 2018-12-19 DIAGNOSIS — Z8719 Personal history of other diseases of the digestive system: Secondary | ICD-10-CM

## 2018-12-19 DIAGNOSIS — Z833 Family history of diabetes mellitus: Secondary | ICD-10-CM | POA: Diagnosis not present

## 2018-12-19 DIAGNOSIS — J441 Chronic obstructive pulmonary disease with (acute) exacerbation: Secondary | ICD-10-CM | POA: Diagnosis present

## 2018-12-19 DIAGNOSIS — E861 Hypovolemia: Secondary | ICD-10-CM | POA: Diagnosis not present

## 2018-12-19 DIAGNOSIS — Z803 Family history of malignant neoplasm of breast: Secondary | ICD-10-CM | POA: Diagnosis not present

## 2018-12-19 DIAGNOSIS — J189 Pneumonia, unspecified organism: Principal | ICD-10-CM | POA: Diagnosis present

## 2018-12-19 DIAGNOSIS — M797 Fibromyalgia: Secondary | ICD-10-CM | POA: Diagnosis present

## 2018-12-19 DIAGNOSIS — D0591 Unspecified type of carcinoma in situ of right breast: Secondary | ICD-10-CM | POA: Diagnosis not present

## 2018-12-19 DIAGNOSIS — N179 Acute kidney failure, unspecified: Secondary | ICD-10-CM | POA: Diagnosis not present

## 2018-12-19 DIAGNOSIS — F329 Major depressive disorder, single episode, unspecified: Secondary | ICD-10-CM | POA: Diagnosis not present

## 2018-12-19 DIAGNOSIS — Z209 Contact with and (suspected) exposure to unspecified communicable disease: Secondary | ICD-10-CM | POA: Diagnosis not present

## 2018-12-19 DIAGNOSIS — Z800 Family history of malignant neoplasm of digestive organs: Secondary | ICD-10-CM

## 2018-12-19 HISTORY — DX: Chronic obstructive pulmonary disease, unspecified: J44.9

## 2018-12-19 HISTORY — DX: Pneumonia, unspecified organism: J18.9

## 2018-12-19 LAB — CBC WITH DIFFERENTIAL/PLATELET
Abs Immature Granulocytes: 0.1 10*3/uL — ABNORMAL HIGH (ref 0.00–0.07)
Basophils Absolute: 0.1 10*3/uL (ref 0.0–0.1)
Basophils Relative: 0 %
Eosinophils Absolute: 0.2 10*3/uL (ref 0.0–0.5)
Eosinophils Relative: 1 %
HCT: 40.7 % (ref 36.0–46.0)
Hemoglobin: 13 g/dL (ref 12.0–15.0)
Immature Granulocytes: 1 %
Lymphocytes Relative: 10 %
Lymphs Abs: 1.6 10*3/uL (ref 0.7–4.0)
MCH: 29 pg (ref 26.0–34.0)
MCHC: 31.9 g/dL (ref 30.0–36.0)
MCV: 90.6 fL (ref 80.0–100.0)
Monocytes Absolute: 0.9 10*3/uL (ref 0.1–1.0)
Monocytes Relative: 6 %
Neutro Abs: 13.5 10*3/uL — ABNORMAL HIGH (ref 1.7–7.7)
Neutrophils Relative %: 82 %
Platelets: 388 10*3/uL (ref 150–400)
RBC: 4.49 MIL/uL (ref 3.87–5.11)
RDW: 13.9 % (ref 11.5–15.5)
WBC: 16.4 10*3/uL — ABNORMAL HIGH (ref 4.0–10.5)
nRBC: 0 % (ref 0.0–0.2)

## 2018-12-19 LAB — COMPREHENSIVE METABOLIC PANEL
ALT: 34 U/L (ref 0–44)
AST: 42 U/L — ABNORMAL HIGH (ref 15–41)
Albumin: 3.1 g/dL — ABNORMAL LOW (ref 3.5–5.0)
Alkaline Phosphatase: 157 U/L — ABNORMAL HIGH (ref 38–126)
Anion gap: 12 (ref 5–15)
BUN: 14 mg/dL (ref 8–23)
CO2: 19 mmol/L — ABNORMAL LOW (ref 22–32)
Calcium: 9.1 mg/dL (ref 8.9–10.3)
Chloride: 104 mmol/L (ref 98–111)
Creatinine, Ser: 1.23 mg/dL — ABNORMAL HIGH (ref 0.44–1.00)
GFR calc Af Amer: 51 mL/min — ABNORMAL LOW (ref >60)
GFR calc non Af Amer: 44 mL/min — ABNORMAL LOW (ref >60)
Glucose, Bld: 113 mg/dL — ABNORMAL HIGH (ref 70–99)
Potassium: 4.1 mmol/L (ref 3.5–5.1)
Sodium: 135 mmol/L (ref 135–145)
Total Bilirubin: 0.9 mg/dL (ref 0.3–1.2)
Total Protein: 7.2 g/dL (ref 6.5–8.1)

## 2018-12-19 LAB — SARS CORONAVIRUS 2 BY RT PCR (HOSPITAL ORDER, PERFORMED IN ~~LOC~~ HOSPITAL LAB): SARS Coronavirus 2: NEGATIVE

## 2018-12-19 MED ORDER — VITAMIN D 25 MCG (1000 UNIT) PO TABS
4000.0000 [IU] | ORAL_TABLET | Freq: Every day | ORAL | Status: DC
  Administered 2018-12-19 – 2018-12-21 (×3): 4000 [IU] via ORAL
  Filled 2018-12-19 (×3): qty 4

## 2018-12-19 MED ORDER — SODIUM CHLORIDE 0.9 % IV SOLN
1.0000 g | INTRAVENOUS | Status: DC
  Administered 2018-12-19 – 2018-12-20 (×2): 1 g via INTRAVENOUS
  Filled 2018-12-19: qty 1
  Filled 2018-12-19: qty 10
  Filled 2018-12-19: qty 1

## 2018-12-19 MED ORDER — AZITHROMYCIN 500 MG PO TABS
500.0000 mg | ORAL_TABLET | Freq: Every day | ORAL | Status: DC
  Administered 2018-12-20 – 2018-12-21 (×2): 500 mg via ORAL
  Filled 2018-12-19 (×2): qty 1

## 2018-12-19 MED ORDER — ACETAMINOPHEN 650 MG RE SUPP: 650.0000 mg | Freq: Four times a day (QID) | RECTAL | Status: DC | PRN

## 2018-12-19 MED ORDER — GABAPENTIN 600 MG PO TABS
600.0000 mg | ORAL_TABLET | Freq: Three times a day (TID) | ORAL | Status: DC
  Administered 2018-12-19 – 2018-12-21 (×6): 600 mg via ORAL
  Filled 2018-12-19 (×6): qty 1

## 2018-12-19 MED ORDER — IPRATROPIUM-ALBUTEROL 0.5-2.5 (3) MG/3ML IN SOLN
3.0000 mL | RESPIRATORY_TRACT | Status: DC | PRN
  Administered 2018-12-20 (×2): 3 mL via RESPIRATORY_TRACT
  Filled 2018-12-19 (×3): qty 3

## 2018-12-19 MED ORDER — NORTRIPTYLINE HCL 25 MG PO CAPS
150.0000 mg | ORAL_CAPSULE | Freq: Every day | ORAL | Status: DC
  Administered 2018-12-19 – 2018-12-20 (×2): 150 mg via ORAL
  Filled 2018-12-19 (×3): qty 6

## 2018-12-19 MED ORDER — PREDNISONE 20 MG PO TABS: 40.0000 mg | ORAL_TABLET | Freq: Every day | ORAL | Status: DC

## 2018-12-19 MED ORDER — PREDNISONE 20 MG PO TABS
40.0000 mg | ORAL_TABLET | Freq: Once | ORAL | Status: AC
  Administered 2018-12-19: 40 mg via ORAL
  Filled 2018-12-19: qty 2

## 2018-12-19 MED ORDER — ENOXAPARIN SODIUM 40 MG/0.4ML ~~LOC~~ SOLN
40.0000 mg | SUBCUTANEOUS | Status: DC
  Administered 2018-12-19 – 2018-12-20 (×2): 40 mg via SUBCUTANEOUS
  Filled 2018-12-19 (×2): qty 0.4

## 2018-12-19 MED ORDER — ALBUTEROL SULFATE HFA 108 (90 BASE) MCG/ACT IN AERS
2.0000 | INHALATION_SPRAY | Freq: Once | RESPIRATORY_TRACT | Status: AC
  Administered 2018-12-19: 2 via RESPIRATORY_TRACT
  Filled 2018-12-19: qty 6.7

## 2018-12-19 MED ORDER — LEVOFLOXACIN 750 MG PO TABS
750.0000 mg | ORAL_TABLET | Freq: Once | ORAL | Status: AC
  Administered 2018-12-19: 750 mg via ORAL
  Filled 2018-12-19: qty 1

## 2018-12-19 MED ORDER — ACETAMINOPHEN 325 MG PO TABS
650.0000 mg | ORAL_TABLET | Freq: Four times a day (QID) | ORAL | Status: DC | PRN
  Administered 2018-12-20: 650 mg via ORAL
  Filled 2018-12-19: qty 2

## 2018-12-19 MED ORDER — IPRATROPIUM-ALBUTEROL 0.5-2.5 (3) MG/3ML IN SOLN
3.0000 mL | Freq: Four times a day (QID) | RESPIRATORY_TRACT | Status: DC
  Administered 2018-12-19 – 2018-12-20 (×3): 3 mL via RESPIRATORY_TRACT
  Filled 2018-12-19 (×3): qty 3

## 2018-12-19 MED ORDER — ONDANSETRON HCL 4 MG PO TABS: 4.0000 mg | ORAL_TABLET | Freq: Four times a day (QID) | ORAL | Status: DC | PRN

## 2018-12-19 MED ORDER — ONDANSETRON HCL 4 MG/2ML IJ SOLN: 4.0000 mg | Freq: Four times a day (QID) | INTRAMUSCULAR | Status: DC | PRN

## 2018-12-19 MED ORDER — DEXTROSE IN LACTATED RINGERS 5 % IV SOLN
INTRAVENOUS | Status: DC
  Administered 2018-12-19 – 2018-12-20 (×2): via INTRAVENOUS

## 2018-12-19 MED ORDER — MOMETASONE FURO-FORMOTEROL FUM 200-5 MCG/ACT IN AERO
2.0000 | INHALATION_SPRAY | Freq: Two times a day (BID) | RESPIRATORY_TRACT | Status: DC
  Administered 2018-12-19 – 2018-12-21 (×4): 2 via RESPIRATORY_TRACT
  Filled 2018-12-19: qty 8.8

## 2018-12-19 MED ORDER — SODIUM CHLORIDE 0.9 % IV BOLUS
500.0000 mL | Freq: Once | INTRAVENOUS | Status: AC
  Administered 2018-12-19: 500 mL via INTRAVENOUS

## 2018-12-19 NOTE — H&P (Signed)
History and Physical    Katie Zimmerman IRJ:[id number] DOB: November 13, 1945 DOA: 12/19/2018  PCP: [name], MD   Patient coming from: Home   Chief Complaint: Dyspnea and cough.   HPI: Katie Zimmerman is a 73 y.o. female with medical history significant of history of breast cancer, fibromyalgia, GERD, osteoporosis and COPD.    Patient reports 7 days of worsening significant productive cough, yellow-greenish phlegm, associated with dyspnea, wheezing and decreased appetite.  No improving or worsening factors.  She uses inhalers at home, not supplemental oxygen.  She gets occasional exacerbations of her COPD.  Her husband was hospitalized recently for pneumonia, he was tested negative for COVID-19.  ED Course: Patient was found ill looking appearing and hypoxic on room air, her chest x-ray showed a right lower lobe pneumonia.  She received submental oxygen, antibiotics and referred for admission.  Review of Systems:  1. General: No fevers, occasional chills, no weight gain or weight loss. Poor oral intake.  2. ENT: No runny nose or sore throat, no hearing disturbances 3. Pulmonary: No dyspnea, cough, wheezing, or hemoptysis 4. Cardiovascular: No angina, claudication, lower extremity edema, pnd or orthopnea 5. Gastrointestinal: positive nausea but no vomiting, no diarrhea or constipation 6. Hematology: No easy bruisability or frequent infections 7. Urology: No dysuria, hematuria or increased urinary frequency 8. Dermatology: No rashes. 9. Neurology: No seizures or paresthesias 10. Musculoskeletal: No joint pain or deformities  Past Medical History:  Diagnosis Date  . Breast cancer ([place]) 12/2011  . Breast cancer ([place]) 12/2011   DCIS  . Complication of anesthesia    hard to wake up age 33-not since  . Fibromyalgia   . GERD (gastroesophageal reflux disease)   . History of radiation therapy 02/29/12 -04/21/12   right breast, 5040 cGy in 28 fx, boost to total 6240 cGy  . Osteoporosis   .  Personal history of colonic polyps-adenomas 05/25/2012  . Personal history of radiation therapy   . PHN (postherpetic neuralgia) 2000   Right side of face    Past Surgical History:  Procedure Laterality Date  . APPENDECTOMY    . BIOPSY BREAST  01/10/12   Right breast needle cor biopsy  . BREAST BIOPSY  01/10/2012  . BREAST CYST ASPIRATION  01/10/2012  . BREAST LUMPECTOMY Right 01/26/2012   Right/ERPR+ DCIS, right ax snbx  . FOOT SURGERY  1996   rt/lt great toes  . GLAUCOMA SURGERY  1993   laser  . OVARIAN CYST SURGERY  1964  . TUBAL LIGATION  1976     reports that she has never smoked. She has never used smokeless tobacco. She reports current alcohol use. She reports that she does not use drugs.  Allergies  Allergen Reactions  . Penicillins Anaphylaxis, Rash and Other (See Comments)    Has patient had a PCN reaction causing immediate rash, facial/tongue/throat swelling, SOB or lightheadedness with hypotension: Yes Has patient had a PCN reaction causing severe rash involving mucus membranes or skin necrosis: No Has patient had a PCN reaction that required hospitalization No Has patient had a PCN reaction occurring within the last 10 years: No If all of the above answers are "NO", then may proceed with Cephalosporin use.  . Venlafaxine Nausea Only  . Sulfonamide Derivatives Rash    Family History  Problem Relation Age of Onset  . Hypertension Mother   . Cancer Mother        LIVER cancer  . Hypertension Father   . Diabetes Father   .  Heart failure Father   . Breast cancer Sister 60       NOT hormone receptor breast cancer  . Breast cancer Maternal Aunt 75  . Breast cancer Other        dx in her 30s  . Breast cancer Cousin 21       maternal cousin  . Breast cancer Cousin        maternal cousin  . Breast cancer Cousin        maternal cousin  . Cancer Cousin        unknown form of cancer; maternal cousin  . Cancer Cousin        unknown form of cancer; maternal  cousin  . Cancer Cousin 11       ? lung cancer; maternal cousin  . Breast cancer Cousin        dx 66s-50s; paternal cousin  . Breast cancer Cousin        dx in her 5s; paternal cousin  . Colon cancer Neg Hx   . Stomach cancer Neg Hx      Prior to Admission medications   Medication Sig Start Date End Date Taking? Authorizing Provider  acetaminophen (TYLENOL) 500 MG tablet Take 1,000 mg by mouth every 4 (four) hours as needed for mild pain, moderate pain or headache.     [provider]  albuterol (PROVENTIL HFA;VENTOLIN HFA) 108 (90 Base) MCG/ACT inhaler Inhale 2 puffs into the lungs every 6 (six) hours as needed for wheezing or shortness of breath. 06/21/18   Young, Kasandra Knudsen, MD  benzonatate (TESSALON) 100 MG capsule Take 1 capsule (100 mg total) by mouth 2 (two) times daily as needed for cough. 12/17/18   Lesleigh Noe, MD  cholecalciferol (VITAMIN D) 1000 units tablet Take 4,000 Units by mouth daily.    [provider]  Fluticasone-Umeclidin-Vilant (TRELEGY ELLIPTA) 100-62.5-25 MCG/INH AEPB Inhale 1 puff into the lungs daily. Rinse mouth. 12/18/17   Baird Lyons D, MD  gabapentin (NEURONTIN) 600 MG tablet Take 2 tablets three times a day 10/17/18   Cameron Sprang, MD  nortriptyline [facility]) 50 MG capsule Take 3 capsules every night 10/17/18   Cameron Sprang, MD  traMADol [facility]at[facility]) 50 MG tablet Take 1 tablet every night for 5 days 11/21/18   Cameron Sprang, MD    Physical Exam: Vitals:   12/19/18 1033 12/19/18 1100 12/19/18 1200 12/19/18 1224  BP:  128/82    Pulse: 95 94 100 91  Resp:      Temp:      TempSrc:      SpO2: 92% 95% 95% 90%    Vitals:   12/19/18 1033 12/19/18 1100 12/19/18 1200 12/19/18 1224  BP:  128/82    Pulse: 95 94 100 91  Resp:      Temp:      TempSrc:      SpO2: 92% 95% 95% 90%   General: deconditioned and ill looking appearing  Neurology: Awake and alert, non focal Head and Neck. Head normocephalic. Neck supple with no adenopathy  or thyromegaly.   E ENT: positive  pallor, no icterus, oral mucosa moist Cardiovascular: No JVD. S1-S2 present, rhythmic, no gallops, rubs, or murmurs. No lower extremity edema. Pulmonary: positive breath sounds bilaterally, decreased breath sounds, no wheezing,pr  Rhonchi, positive right base rales. Gastrointestinal. Abdomen with, no organomegaly, non tender, no rebound or guarding Skin. No rashes Musculoskeletal: no joint deformities    Labs on Admission: I have personally  reviewed following labs and imaging studies  CBC: Recent Labs  Lab 12/19/18 1121  WBC 16.4*  NEUTROABS 13.5*  HGB 13.0  HCT 40.7  MCV 90.6  PLT 542   Basic Metabolic Panel: Recent Labs  Lab 12/19/18 1121  NA 135  K 4.1  CL 104  CO2 19*  GLUCOSE 113*  BUN 14  CREATININE 1.23*  CALCIUM 9.1   GFR: CrCl cannot be calculated (Unknown ideal weight.). Liver Function Tests: Recent Labs  Lab 12/19/18 1121  AST 42*  ALT 34  ALKPHOS 157*  BILITOT 0.9  PROT 7.2  ALBUMIN 3.1*   No results for input(s): LIPASE, AMYLASE in the last 168 hours. No results for input(s): AMMONIA in the last 168 hours. Coagulation Profile: No results for input(s): INR, PROTIME in the last 168 hours. Cardiac Enzymes: No results for input(s): CKTOTAL, CKMB, CKMBINDEX, TROPONINI in the last 168 hours. BNP (last 3 results) No results for input(s): PROBNP in the last 8760 hours. HbA1C: No results for input(s): HGBA1C in the last 72 hours. CBG: No results for input(s): GLUCAP in the last 168 hours. Lipid Profile: No results for input(s): CHOL, HDL, LDLCALC, TRIG, CHOLHDL, LDLDIRECT in the last 72 hours. Thyroid Function Tests: No results for input(s): TSH, T4TOTAL, FREET4, T3FREE, THYROIDAB in the last 72 hours. Anemia Panel: No results for input(s): VITAMINB12, FOLATE, FERRITIN, TIBC, IRON, RETICCTPCT in the last 72 hours. Urine analysis:    Component Value Date/Time   COLORURINE YELLOW 03/05/2016 1655   APPEARANCEUR  CLOUDY (A) 03/05/2016 1655   LABSPEC 1.020 03/05/2016 1655   PHURINE 6.0 03/05/2016 1655   GLUCOSEU NEGATIVE 03/05/2016 1655   HGBUR MODERATE (A) 03/05/2016 1655   BILIRUBINUR negative 03/15/2016 1604   KETONESUR 15 (A) 03/05/2016 1655   PROTEINUR trace 03/15/2016 1604   PROTEINUR 100 (A) 03/05/2016 1655   UROBILINOGEN 1.0 03/15/2016 1604   UROBILINOGEN 1.0 03/05/2016 1339   NITRITE negative 03/15/2016 1604   NITRITE POSITIVE (A) 03/05/2016 1655   LEUKOCYTESUR Trace (A) 03/15/2016 1604    Radiological Exams on Admission: Dg Chest Port 1 View  Result Date: 12/19/2018 CLINICAL DATA:  Shortness of breath, cough EXAM: PORTABLE CHEST 1 VIEW COMPARISON:  11/17/2017 FINDINGS: Mild cardiomegaly. Airspace opacity noted at the right lung base could reflect atelectasis. Pneumonia is not excluded. No confluent opacity on the left. No effusions. No acute bony abnormality. IMPRESSION: Mild cardiomegaly. Right basilar atelectasis versus pneumonia. Electronically Signed   By: Rolm Baptise M.D.   On: 12/19/2018 11:53    EKG: Independently reviewed. NA  Assessment/Plan Principal Problem:   Pneumonia Active Problems:   COPD (chronic obstructive pulmonary disease) with chronic bronchitis (HCC)   Cancer of right breast, stage 0   GERD (gastroesophageal reflux disease)   Respiratory failure ([place])  73 year old female with past medical history for COPD who presents with 7 days of persistent, and productive cough, associated with dyspnea, and wheezing.  On her initial physical examination temperature 98.6, blood pressure 113/74, heart rate 106, respiratory rate 16, oxygen saturation down to the 80s on room air, 90% on 2 L per nasal cannula.  Dry mucous membranes, positive rales right base, heart S1-S2 present rhythmic, abdomen soft, no lower extremity edema. Sodium 135, potassium 4.1, chloride 104, bicarb 19, glucose 113, BUN 14, creatinine 1.23, white count 16.4, hemoglobin 13.0, hematocrit 40.7, platelets  388.  COVID-19 negative.  Chest radiograph with a right base interstitial, alveolar infiltrate.   Patient will be admitted to the hospital with the working  diagnosis of acute hypoxic respiratory failure due to right lower lobe community-acquired pneumonia.   1.  Acute hypoxic respiratory failure due to right lower lobe community-acquired pneumonia/present on admission.  Patient will be admitted to the medical ward, continue antibiotic therapy with IV ceftriaxone and p.o. azithromycin.  Gentle hydration with lactated Ringer's 50 ml/H.  Continue to follow-up on cultures, cell count and temperature curve.  Check Legionella and Streptococcus pneumoniae urinary antigens.  Continue oximetry monitoring and supplemental oxygen per nasal cannula, target O2 saturation greater 92%.  2.  COPD with acute exacerbation.  Will continue bronchodilator therapy with DuoNeb, inhaled corticosteroid and will add low-dose prednisone.  Continue oximetry monitoring.  3.  Acute kidney injury.  Renal function with serum creatinine 1.23, patient clinically hypovolemic, continue gentle hydration with lactated Ringers at 50 mL/h, avoid volume overload.  Follow-up kidney function in the morning, avoid hypotension or nephrotoxic agents.  4.  Depression.  Continue nortriptyline.  DVT prophylaxis:  Enoxaparin  Code Status:  full  Family Communication: no family at the bedside   Disposition Plan: Medical ward    Consults called: none   Admission status:  Inpatient.    Mauricio Gerome Apley MD [facility]   12/19/2018, 1:46 PM

## 2018-12-19 NOTE — ED Triage Notes (Signed)
Pt has chronic bronchitis and has cough x 1 week. Pt's physician sts her voice is weaker than normal and she might have had a stroke. Per EMS no neuro deficits. Pt denies weakness.

## 2018-12-19 NOTE — ED Notes (Signed)
sats 90-91% RA

## 2018-12-19 NOTE — Progress Notes (Signed)
NEW ADMISSION NOTE New Admission Note:   Arrival Method: Bed Mental Orientation: A&OX4 Telemetry: Not Ordered Assessment: Completed Skin: WDL IV: WDL Pain: Denies Safety Measures: Safety Fall Prevention Plan has been given, discussed and signed Admission: Completed [facility] Orientation: Patient has been orientated to the room, unit and staff.   Orders have been reviewed and implemented. Will continue to monitor the patient. Call light has been placed within reach and bed alarm has been activated.   Aneta Mins BSN, RN3

## 2018-12-19 NOTE — ED Notes (Signed)
Dinner order placed 

## 2018-12-19 NOTE — ED Notes (Signed)
ED TO INPATIENT HANDOFF REPORT  ED Nurse Name and Phone #: [phone]  S Name/Age/Gender Katie Zimmerman 73 y.o. female Room/Bed: 053C/053C  Code Status   Code Status: Not on file  Home/SNF/Other Home Patient oriented to: self, place, time and situation Is this baseline? Yes   Triage Complete: Triage complete  Chief Complaint Cough  Triage Note Pt has chronic bronchitis and has cough x 1 week. Pt's physician sts her voice is weaker than normal and she might have had a stroke. Per EMS no neuro deficits. Pt denies weakness.    Allergies Allergies  Allergen Reactions  . Penicillins Anaphylaxis, Rash and Other (See Comments)    Has patient had a PCN reaction causing immediate rash, facial/tongue/throat swelling, SOB or lightheadedness with hypotension: Yes Has patient had a PCN reaction causing severe rash involving mucus membranes or skin necrosis: No Has patient had a PCN reaction that required hospitalization No Has patient had a PCN reaction occurring within the last 10 years: No If all of the above answers are "NO", then may proceed with Cephalosporin use.  . Venlafaxine Nausea Only  . Sulfonamide Derivatives Rash    Level of Care/Admitting Diagnosis ED Disposition    ED Disposition Condition Comment   Admit  Hospital Area: [facility] [[id number]]  Level of Care: Med-Surg [16]  Covid Evaluation: Confirmed COVID Negative  Diagnosis: Pneumonia [[id number]]  Admitting Physician: Tawni Millers [[id number]]  Attending Physician: Tawni Millers [[id number]]  Estimated length of stay: 3 - 4 days  Certification:: I certify this patient will need inpatient services for at least 2 midnights  PT Class (Do Not Modify): Inpatient [101]  PT Acc Code (Do Not Modify): Private [1]       B Medical/Surgery History Past Medical History:  Diagnosis Date  . Breast cancer ([place]) 12/2011  . Breast cancer ([place]) 12/2011   DCIS  . Complication of anesthesia    hard  to wake up age 19-not since  . Fibromyalgia   . GERD (gastroesophageal reflux disease)   . History of radiation therapy 02/29/12 -04/21/12   right breast, 5040 cGy in 28 fx, boost to total 6240 cGy  . Osteoporosis   . Personal history of colonic polyps-adenomas 05/25/2012  . Personal history of radiation therapy   . PHN (postherpetic neuralgia) 2000   Right side of face   Past Surgical History:  Procedure Laterality Date  . APPENDECTOMY    . BIOPSY BREAST  01/10/12   Right breast needle cor biopsy  . BREAST BIOPSY  01/10/2012  . BREAST CYST ASPIRATION  01/10/2012  . BREAST LUMPECTOMY Right 01/26/2012   Right/ERPR+ DCIS, right ax snbx  . FOOT SURGERY  1996   rt/lt great toes  . GLAUCOMA SURGERY  1993   laser  . OVARIAN CYST SURGERY  1964  . TUBAL LIGATION  1976     A IV Location/Drains/Wounds Patient Lines/Drains/Airways Status   Active Line/Drains/Airways    Name:   Placement date:   Placement time:   Site:   Days:   Peripheral IV 12/19/18 Left Antecubital   12/19/18    1118    Antecubital   less than 1   Incision 01/26/12 Chest Right   01/26/12    1158     2519          Intake/Output Last 24 hours No intake or output data in the 24 hours ending 12/19/18 1516  Labs/Imaging Results for orders placed or performed during the hospital  encounter of 12/19/18 (from the past 48 hour(s))  SARS Coronavirus 2 (CEPHEID- Performed in [facility] hospital lab), Hosp Order     Status: None   Collection Time: 12/19/18 10:33 AM   Specimen: Nasopharyngeal Swab  Result Value Ref Range   SARS Coronavirus 2 NEGATIVE NEGATIVE    Comment: (NOTE) If result is NEGATIVE SARS-CoV-2 target nucleic acids are NOT DETECTED. The SARS-CoV-2 RNA is generally detectable in upper and lower  respiratory specimens during the acute phase of infection. The lowest  concentration of SARS-CoV-2 viral copies this assay can detect is 250  copies / mL. A negative result does not preclude SARS-CoV-2 infection   and should not be used as the sole basis for treatment or other  patient management decisions.  A negative result may occur with  improper specimen collection / handling, submission of specimen other  than nasopharyngeal swab, presence of viral mutation(s) within the  areas targeted by this assay, and inadequate number of viral copies  (<250 copies / mL). A negative result must be combined with clinical  observations, patient history, and epidemiological information. If result is POSITIVE SARS-CoV-2 target nucleic acids are DETECTED. The SARS-CoV-2 RNA is generally detectable in upper and lower  respiratory specimens dur ing the acute phase of infection.  Positive  results are indicative of active infection with SARS-CoV-2.  Clinical  correlation with patient history and other diagnostic information is  necessary to determine patient infection status.  Positive results do  not rule out bacterial infection or co-infection with other viruses. If result is PRESUMPTIVE POSTIVE SARS-CoV-2 nucleic acids MAY BE PRESENT.   A presumptive positive result was obtained on the submitted specimen  and confirmed on repeat testing.  While 2019 novel coronavirus  (SARS-CoV-2) nucleic acids may be present in the submitted sample  additional confirmatory testing may be necessary for epidemiological  and / or clinical management purposes  to differentiate between  SARS-CoV-2 and other Sarbecovirus currently known to infect humans.  If clinically indicated additional testing with an alternate test  methodology [phone]) is advised. The SARS-CoV-2 RNA is generally  detectable in upper and lower respiratory sp ecimens during the acute  phase of infection. The expected result is Negative. Fact Sheet for Patients:  StrictlyIdeas.no Fact Sheet for Healthcare Providers: BankingDealers.co.za This test is not yet approved or cleared by the [place] FDA  and has been authorized for detection and/or diagnosis of SARS-CoV-2 by FDA under an Emergency Use Authorization (EUA).  This EUA will remain in effect (meaning this test can be used) for the duration of the COVID-19 declaration under Section 564(b)(1) of the Act, 21 U.S.C. section 360bbb-3(b)(1), unless the authorization is terminated or revoked sooner. Performed at [facility], [place] [address], [place], [place] [address]   Comprehensive metabolic panel     Status: Abnormal   Collection Time: 12/19/18 11:21 AM  Result Value Ref Range   Sodium 135 135 - 145 mmol/L   Potassium 4.1 3.5 - 5.1 mmol/L   Chloride 104 98 - 111 mmol/L   CO2 19 (L) 22 - 32 mmol/L   Glucose, Bld 113 (H) 70 - 99 mg/dL   BUN 14 8 - 23 mg/dL   Creatinine, Ser 1.23 (H) 0.44 - 1.00 mg/dL   Calcium 9.1 8.9 - 10.3 mg/dL   Total Protein 7.2 6.5 - 8.1 g/dL   Albumin 3.1 (L) 3.5 - 5.0 g/dL   AST 42 (H) 15 - 41 U/L   ALT 34 0 -  44 U/L   Alkaline Phosphatase 157 (H) 38 - 126 U/L   Total Bilirubin 0.9 0.3 - 1.2 mg/dL   GFR calc non Af Amer 44 (L) >60 mL/min   GFR calc Af Amer 51 (L) >60 mL/min   Anion gap 12 5 - 15    Comment: Performed at [facility] [address], [place], [place] [address]  CBC with Differential     Status: Abnormal   Collection Time: 12/19/18 11:21 AM  Result Value Ref Range   WBC 16.4 (H) 4.0 - 10.5 K/uL   RBC 4.49 3.87 - 5.11 MIL/uL   Hemoglobin 13.0 12.0 - 15.0 g/dL   HCT 40.7 36.0 - 46.0 %   MCV 90.6 80.0 - 100.0 fL   MCH 29.0 26.0 - 34.0 pg   MCHC 31.9 30.0 - 36.0 g/dL   RDW 13.9 11.5 - 15.5 %   Platelets 388 150 - 400 K/uL   nRBC 0.0 0.0 - 0.2 %   Neutrophils Relative % 82 %   Neutro Abs 13.5 (H) 1.7 - 7.7 K/uL   Lymphocytes Relative 10 %   Lymphs Abs 1.6 0.7 - 4.0 K/uL   Monocytes Relative 6 %   Monocytes Absolute 0.9 0.1 - 1.0 K/uL   Eosinophils Relative 1 %   Eosinophils Absolute 0.2 0.0 - 0.5 K/uL   Basophils Relative 0 %   Basophils Absolute 0.1 0.0 - 0.1  K/uL   Immature Granulocytes 1 %   Abs Immature Granulocytes 0.10 (H) 0.00 - 0.07 K/uL    Comment: Performed at [facility] [address], [place], [place] [address]   Dg Chest Port 1 View  Result Date: 12/19/2018 CLINICAL DATA:  Shortness of breath, cough EXAM: PORTABLE CHEST 1 VIEW COMPARISON:  11/17/2017 FINDINGS: Mild cardiomegaly. Airspace opacity noted at the right lung base could reflect atelectasis. Pneumonia is not excluded. No confluent opacity on the left. No effusions. No acute bony abnormality. IMPRESSION: Mild cardiomegaly. Right basilar atelectasis versus pneumonia. Electronically Signed   By: Rolm Baptise M.D.   On: 12/19/2018 11:53    Pending Labs [employer] (From admission, onward)    Start     Ordered   Signed and Held  CBC  (enoxaparin (LOVENOX)    CrCl >/= 30 ml/min)  Once,   R    Comments: Baseline for enoxaparin therapy IF NOT ALREADY DRAWN.  Notify MD if PLT < 100 K.    Signed and Held   Signed and Held  Creatinine, serum  (enoxaparin (LOVENOX)    CrCl >/= 30 ml/min)  Once,   R    Comments: Baseline for enoxaparin therapy IF NOT ALREADY DRAWN.    Signed and Held   Signed and Held  Creatinine, serum  (enoxaparin (LOVENOX)    CrCl >/= 30 ml/min)  Weekly,   R    Comments: while on enoxaparin therapy    Signed and Held   Signed and Held  Basic metabolic panel  Tomorrow morning,   R     Signed and Held   Signed and Held  CBC  Tomorrow morning,   R     Signed and Held   Signed and Held  Legionella Pneumophila Serogp 1 Ur Ag  Once,   R     Signed and Held   Signed and Held  Strep pneumoniae urinary antigen  Once,   R     Signed and Held  Vitals/Pain Today's Vitals   12/19/18 1100 12/19/18 1200 12/19/18 1224 12/19/18 1330  BP: 128/82   (!) 128/92  Pulse: 94 100 91 89  Resp:      Temp:      TempSrc:      SpO2: 95% 95% 90% 96%    Isolation Precautions Airborne and Contact precautions  Medications Medications  albuterol  (VENTOLIN HFA) 108 (90 Base) MCG/ACT inhaler 2 puff (2 puffs Inhalation Given 12/19/18 1118)  predniSONE (DELTASONE) tablet 40 mg (40 mg Oral Given 12/19/18 1119)  sodium chloride 0.9 % bolus 500 mL (0 mLs Intravenous Stopped 12/19/18 1305)  levofloxacin (LEVAQUIN) tablet 750 mg (750 mg Oral Given 12/19/18 1308)    Mobility walks with person assist Low fall risk   Focused Assessments Pulmonary Assessment Handoff:  Lung sounds: Bilateral Breath Sounds: Clear O2 Device: Nasal Cannula O2 Flow Rate (L/min): 2 L/min      R Recommendations: See Admitting Provider Note  Report given to:   Additional Notes: 20 LAC, Room air saturations 92-93% 2L [address] saturation 96%. Keeping for observation. Received Levoquin PO @1308

## 2018-12-19 NOTE — Plan of Care (Signed)
  Problem: Pain Managment: Goal: General experience of comfort will improve Outcome: Progressing   

## 2018-12-19 NOTE — Telephone Encounter (Signed)
Agree with going to the ER

## 2018-12-19 NOTE — ED Provider Notes (Signed)
[facility] EMERGENCY DEPARTMENT Provider Note   CSN: [id number] Arrival date & time: 12/19/18  2951    History   Chief Complaint Chief Complaint  Patient presents with  . Cough    HPI Katie Zimmerman is a 73 y.o. female.     The history is provided by the patient and medical records. No language interpreter was used.  Cough  Katie Zimmerman is a 73 y.o. female who presents to the Emergency Department complaining of sob.  She presents to the ED complaining of cough for the last week (has chronic cough, worse for the last week).  Cough is productive of yellow/green sputum.  Denies associated fever, chest pain, leg swelling/pain.  She does have abdominal soreness related to cough.  ROS positive for nausea, diarrhea, sob, dysuria.  Denies covid19 exposure.  Her husband was recently admitted to the [facility] -returned home about a week ago.   Past Medical History:  Diagnosis Date  . Breast cancer ([place]) 12/2011  . Breast cancer ([place]) 12/2011   DCIS  . Complication of anesthesia    hard to wake up age 60-not since  . Fibromyalgia   . GERD (gastroesophageal reflux disease)   . History of radiation therapy 02/29/12 -04/21/12   right breast, 5040 cGy in 28 fx, boost to total 6240 cGy  . Osteoporosis   . Personal history of colonic polyps-adenomas 05/25/2012  . Personal history of radiation therapy   . PHN (postherpetic neuralgia) 2000   Right side of face    Patient Active Problem List   Diagnosis Date Noted  . Pneumonia 12/19/2018  . Respiratory failure ([place]) 12/19/2018  . Cough, persistent 10/17/2017  . Headache 02/21/2017  . COPD exacerbation ([place]) 12/30/2016  . BMI 33.0-33.9,adult 08/09/2016  . Increased endometrial stripe thickness 03/18/2016  . [place] arthritis 01/22/2015  . Counseling regarding end of life decision making 01/22/2015  . Osteoporosis 01/28/2014  . Hearing loss in right ear 01/20/2014  . Acute bronchitis with COPD ([place]) 12/24/2012  . Personal  history of colonic polyps-adenomas 05/25/2012  . GERD (gastroesophageal reflux disease)   . History of radiation therapy   . Cancer of right breast, stage 0 01/26/2012  . Prediabetes 11/22/2011  . High cholesterol 11/22/2011  . TEMPOROMANDIBULAR JOINT PAIN 11/04/2009  . COPD (chronic obstructive pulmonary disease) with chronic bronchitis ([place]) 05/04/2009  . RENAL CALCULUS 05/04/2009  . Post herpetic neuralgia 04/14/2009    Past Surgical History:  Procedure Laterality Date  . APPENDECTOMY    . BIOPSY BREAST  01/10/12   Right breast needle cor biopsy  . BREAST BIOPSY  01/10/2012  . BREAST CYST ASPIRATION  01/10/2012  . BREAST LUMPECTOMY Right 01/26/2012   Right/ERPR+ DCIS, right ax snbx  . FOOT SURGERY  1996   rt/lt great toes  . GLAUCOMA SURGERY  1993   laser  . OVARIAN CYST SURGERY  1964  . TUBAL LIGATION  1976     OB History   No obstetric history on file.      Home Medications    Prior to Admission medications   Medication Sig Start Date End Date Taking? Authorizing Provider  acetaminophen (TYLENOL) 500 MG tablet Take 1,000 mg by mouth every 4 (four) hours as needed for mild pain, moderate pain or headache.    Yes [provider]  albuterol (PROVENTIL HFA;VENTOLIN HFA) 108 (90 Base) MCG/ACT inhaler Inhale 2 puffs into the lungs every 6 (six) hours as needed for wheezing or shortness of  breath. 06/21/18  Yes Young, Tarri Fuller D, MD  benzonatate (TESSALON) 100 MG capsule Take 1 capsule (100 mg total) by mouth 2 (two) times daily as needed for cough. 12/17/18  Yes Lesleigh Noe, MD  cholecalciferol (VITAMIN D) 1000 units tablet Take 4,000 Units by mouth daily.   Yes [provider]  Fluticasone-Umeclidin-Vilant (TRELEGY ELLIPTA) 100-62.5-25 MCG/INH AEPB Inhale 1 puff into the lungs daily. Rinse mouth. 12/18/17  Yes Young, Tarri Fuller D, MD  gabapentin (NEURONTIN) 600 MG tablet Take 2 tablets three times a day Patient taking differently: Take 600 mg by mouth 3  (three) times daily.  10/17/18  Yes Cameron Sprang, MD  nortriptyline (PAMELOR) 50 MG capsule Take 3 capsules every night Patient taking differently: Take 150 mg by mouth at bedtime.  10/17/18  Yes Cameron Sprang, MD  traMADol Veatrice Bourbon) 50 MG tablet Take 1 tablet every night for 5 days 11/21/18  Yes Cameron Sprang, MD    Family History Family History  Problem Relation Age of Onset  . Hypertension Mother   . Cancer Mother        LIVER cancer  . Hypertension Father   . Diabetes Father   . Heart failure Father   . Breast cancer Sister 45       NOT hormone receptor breast cancer  . Breast cancer Maternal Aunt 75  . Breast cancer Other        dx in her 60s  . Breast cancer Cousin 35       maternal cousin  . Breast cancer Cousin        maternal cousin  . Breast cancer Cousin        maternal cousin  . Cancer Cousin        unknown form of cancer; maternal cousin  . Cancer Cousin        unknown form of cancer; maternal cousin  . Cancer Cousin 11       ? lung cancer; maternal cousin  . Breast cancer Cousin        dx 80s-50s; paternal cousin  . Breast cancer Cousin        dx in her 69s; paternal cousin  . Colon cancer Neg Hx   . Stomach cancer Neg Hx     Social History Social History   Tobacco Use  . Smoking status: Never Smoker  . Smokeless tobacco: Never Used  Substance Use Topics  . Alcohol use: Yes    Comment: occasional glass of wine  . Drug use: No     Allergies   Penicillins, Venlafaxine, and Sulfonamide derivatives   Review of Systems Review of Systems  Respiratory: Positive for cough.   All other systems reviewed and are negative.    Physical Exam Updated Vital Signs BP (!) 128/92   Pulse 89   Temp 98.6 F (37 C) (Oral)   Resp 16   SpO2 96%   Physical Exam Vitals signs and nursing note reviewed.  Constitutional:      Appearance: She is well-developed.  HENT:     Head: Normocephalic and atraumatic.  Cardiovascular:     Rate and Rhythm:  Normal rate and regular rhythm.     Heart sounds: No murmur.  Pulmonary:     Effort: Pulmonary effort is normal. No respiratory distress.     Breath sounds: Normal breath sounds.  Abdominal:     Palpations: Abdomen is soft.     Tenderness: There is no abdominal tenderness. There is no  guarding or rebound.  Musculoskeletal:        General: No swelling or tenderness.  Skin:    General: Skin is warm and dry.  Neurological:     Mental Status: She is alert and oriented to person, place, and time.  Psychiatric:        Behavior: Behavior normal.      ED Treatments / Results  Labs (all labs ordered are listed, but only abnormal results are displayed) Labs Reviewed  COMPREHENSIVE METABOLIC PANEL - Abnormal; Notable for the following components:      Result Value   CO2 19 (*)    Glucose, Bld 113 (*)    Creatinine, Ser 1.23 (*)    Albumin 3.1 (*)    AST 42 (*)    Alkaline Phosphatase 157 (*)    GFR calc non Af Amer 44 (*)    GFR calc Af Amer 51 (*)    All other components within normal limits  CBC WITH DIFFERENTIAL/PLATELET - Abnormal; Notable for the following components:   WBC 16.4 (*)    Neutro Abs 13.5 (*)    Abs Immature Granulocytes 0.10 (*)    All other components within normal limits  SARS CORONAVIRUS 2 (HOSPITAL ORDER, [facility])    EKG None  Radiology Dg Chest Port 1 View  Result Date: 12/19/2018 CLINICAL DATA:  Shortness of breath, cough EXAM: PORTABLE CHEST 1 VIEW COMPARISON:  11/17/2017 FINDINGS: Mild cardiomegaly. Airspace opacity noted at the right lung base could reflect atelectasis. Pneumonia is not excluded. No confluent opacity on the left. No effusions. No acute bony abnormality. IMPRESSION: Mild cardiomegaly. Right basilar atelectasis versus pneumonia. Electronically Signed   By: Rolm Baptise M.D.   On: 12/19/2018 11:53    Procedures Procedures (including critical care time)  Medications Ordered in ED Medications   albuterol (VENTOLIN HFA) 108 (90 Base) MCG/ACT inhaler 2 puff (2 puffs Inhalation Given 12/19/18 1118)  predniSONE (DELTASONE) tablet 40 mg (40 mg Oral Given 12/19/18 1119)  sodium chloride 0.9 % bolus 500 mL (0 mLs Intravenous Stopped 12/19/18 1305)  levofloxacin (LEVAQUIN) tablet 750 mg (750 mg Oral Given 12/19/18 1308)     Initial Impression / Assessment and Plan / ED Course  I have reviewed the triage vital signs and the nursing notes.  Pertinent labs & imaging results that were available during my care of the patient were reviewed by me and considered in my medical decision making (see chart for details).        Patient presents from home for evaluation of progressive cough. She does have new oxygen requirement of 2 L nasal cannula in the emergency department. She easily desats to 88% at rest. Chest x-ray demonstrates right lobar infiltrate. Labs are significant for leukocytosis. No significant change in symptoms following albuterol and prednisone administration. She was treated with antibiotics for community acquired pneumonia. Hospitalist consulted for admission. Patient and husband updated of findings of studies recommendation for admission and she is in agreement with treatment plan.  Final Clinical Impressions(s) / ED Diagnoses   Final diagnoses:  Community acquired pneumonia of right lower lobe of lung [facility])    ED Discharge Orders    None       Quintella Reichert, MD 12/19/18 1552

## 2018-12-19 NOTE — Telephone Encounter (Signed)
Anastasiya CMA had called pt to ck on her and Anastasiya thought her speech was off or slower from 12/17/18 with virtual appt. I spoke with pt and pt speech did appear slow like pt was having to take extra time to think what she needed to say;pt has weakness in legs this morning and when she went to the bathroom pt said she was not sure she was going to get back from the bathroom due to leg weakness. Pt has H/A and dizziness. Prod cough with green, yellow phlegm no better. Pt did not go for covid testing; pt said she did not think any more about it was the reason she did not go.no one else at pts home that can drive. Advised I can call ambulance but pt wants her sister, Harriette to take pt to ED. I spoke with Harriette and she will pick up pt and take her to ED. I advised pt and Harriette that if pt is unable due to weakness to get to car to call 911. Harriette and pt are both aware need to go to ED now and both voiced understanding. While I was finishing this note Harriette calls back and said Harriette thinks pt needs to go by ambulance to hospital. Harriette ask that I arrange for ambulance to pick up pt. I spoke with pt and she will go to ED by ambulance. I called 911 and an ambulance is being dispatched to pick up pt. FYI to Dr Einar Pheasant and DR Diona Browner.

## 2018-12-20 ENCOUNTER — Ambulatory Visit: Payer: Medicare Other | Admitting: Internal Medicine

## 2018-12-20 DIAGNOSIS — J441 Chronic obstructive pulmonary disease with (acute) exacerbation: Secondary | ICD-10-CM

## 2018-12-20 LAB — CBC
HCT: 50.8 % — ABNORMAL HIGH (ref 36.0–46.0)
HCT: 41.1 % (ref 36.0–46.0)
Hemoglobin: 16.2 g/dL — ABNORMAL HIGH (ref 12.0–15.0)
Hemoglobin: 13 g/dL (ref 12.0–15.0)
MCH: 28.7 pg (ref 26.0–34.0)
MCH: 28.7 pg (ref 26.0–34.0)
MCHC: 31.9 g/dL (ref 30.0–36.0)
MCHC: 31.6 g/dL (ref 30.0–36.0)
MCV: 89.9 fL (ref 80.0–100.0)
MCV: 90.7 fL (ref 80.0–100.0)
Platelets: 235 10*3/uL (ref 150–400)
Platelets: 427 10*3/uL — ABNORMAL HIGH (ref 150–400)
RBC: 5.65 MIL/uL — ABNORMAL HIGH (ref 3.87–5.11)
RBC: 4.53 MIL/uL (ref 3.87–5.11)
RDW: 14.1 % (ref 11.5–15.5)
RDW: 14.2 % (ref 11.5–15.5)
WBC: 12.5 10*3/uL — ABNORMAL HIGH (ref 4.0–10.5)
WBC: 14.3 10*3/uL — ABNORMAL HIGH (ref 4.0–10.5)
nRBC: 0 % (ref 0.0–0.2)
nRBC: 0 % (ref 0.0–0.2)

## 2018-12-20 LAB — BASIC METABOLIC PANEL
Anion gap: 13 (ref 5–15)
Anion gap: 12 (ref 5–15)
BUN: 17 mg/dL (ref 8–23)
BUN: 18 mg/dL (ref 8–23)
CO2: 22 mmol/L (ref 22–32)
CO2: 20 mmol/L — ABNORMAL LOW (ref 22–32)
Calcium: 9.3 mg/dL (ref 8.9–10.3)
Calcium: 9.3 mg/dL (ref 8.9–10.3)
Chloride: 106 mmol/L (ref 98–111)
Chloride: 106 mmol/L (ref 98–111)
Creatinine, Ser: 1.01 mg/dL — ABNORMAL HIGH (ref 0.44–1.00)
Creatinine, Ser: 1.03 mg/dL — ABNORMAL HIGH (ref 0.44–1.00)
GFR calc Af Amer: >60 mL/min (ref >60)
GFR calc Af Amer: >60 mL/min (ref >60)
GFR calc non Af Amer: 56 mL/min — ABNORMAL LOW (ref >60)
GFR calc non Af Amer: 54 mL/min — ABNORMAL LOW (ref >60)
Glucose, Bld: 106 mg/dL — ABNORMAL HIGH (ref 70–99)
Glucose, Bld: 186 mg/dL — ABNORMAL HIGH (ref 70–99)
Potassium: 3.7 mmol/L (ref 3.5–5.1)
Potassium: 3.9 mmol/L (ref 3.5–5.1)
Sodium: 141 mmol/L (ref 135–145)
Sodium: 138 mmol/L (ref 135–145)

## 2018-12-20 LAB — STREP PNEUMONIAE URINARY ANTIGEN: Strep Pneumo Urinary Antigen: NEGATIVE

## 2018-12-20 MED ORDER — IPRATROPIUM-ALBUTEROL 0.5-2.5 (3) MG/3ML IN SOLN
3.0000 mL | Freq: Three times a day (TID) | RESPIRATORY_TRACT | Status: DC
  Administered 2018-12-20 – 2018-12-21 (×2): 3 mL via RESPIRATORY_TRACT
  Filled 2018-12-20 (×2): qty 3

## 2018-12-20 MED ORDER — PREDNISONE 20 MG PO TABS
40.0000 mg | ORAL_TABLET | Freq: Every day | ORAL | Status: DC
  Administered 2018-12-20 – 2018-12-21 (×2): 40 mg via ORAL
  Filled 2018-12-20 (×2): qty 2

## 2018-12-20 NOTE — Progress Notes (Signed)
O2 saturation while ambulating in the hallway was 88-89%. MD Nettey made aware.

## 2018-12-20 NOTE — Telephone Encounter (Signed)
Noted  

## 2018-12-20 NOTE — Plan of Care (Signed)
  Problem: Education: Goal: Knowledge of General Education information will improve Description: Including pain rating scale, medication(s)/side effects and non-pharmacologic comfort measures Outcome: Progressing   Problem: Safety: Goal: Ability to remain free from injury will improve Outcome: Progressing   

## 2018-12-20 NOTE — Progress Notes (Addendum)
[facility]  PROGRESS NOTE  Katie Zimmerman YBO:[id number] DOB: [birth year] DOA: 12/19/2018 PCP: Katie Sanders, MD  Brief History    Katie Zimmerman is a 73 y.o. year old female with medical history significant for breast cancer, fibromyalgia, GERD, osteoporosis and COPD who presented on 12/19/2018 with 1 week progressively worsening cough rectal of yellow/greenish sputum with associated wheezing and decreased appetite not improving with home inhaler use and was found to have acute hypoxic respiratory failure secondary to right lower lobe pneumonia and COPD (chronic bronchitis) exacerbation.  ED clinical summary: In the ED patient was found to have resting oxygen saturation of 80% requiring 2 L to improve to 90% prompting admission given chest x-ray showed concern for a right lower lobe opacity/atelectasis treated with IV antibiotics, supplemental oxygen and scheduled inhaler regimen.  A & P   Acute hypoxic respiratory failure secondary to right lower lobe CAP, present on admission, improving.  No longer hypoxic at rest however on ambulatory O2 testing still requiring O2 to maintain saturation greater than 87% we will continue additional day of IV ceftriaxone azithromycin in hopes this will improve oxygenation in addition to supportive care as inpatient,  follow Legionella test.  Strep pneumo globin test are negative.  Anticipate de-escalating to oral antibiotics on 7/24, will repeat ambulatory O2 test at that time if still low will arrange for O2 support with social worker. Continue COPD treatment below   Acute exacerbation of COPD/chronic bronchitis, improving.  No overt wheezing and no O2 requirements at rest, amatory O2 test, continue scheduled duo nebs, prednisone 40 mg x 4 additional days.  Add flutter valve, incentive spirometry supportive care.   AKI, likely prerenal, likely related to diminished oral intake in setting of chronic cough, expect improvement with fluid resuscitation, baseline  creatinine 0.7-0.8).  Admission creatinine 1.23, awaiting BMP today.   Leukocytosis.  16.4 on admission, and currently 12.5.  Has remained afebrile with no other signs of Sirs/sepsis.  Likely reactive.  Continue to closely monitor.    Right-sided postherpetic neuralgia, stable.  Followed by outpatient neurology.  Continue home nortriptyline, gabapentin continue nortriptyline     DVT prophylaxis: Lovenox Code Status: Full Family Communication: No family at bedside Disposition Plan: Anticipate discharge next 24 hours pending continued improvement, awaiting results of Legionella culture, repeat ambulatory O2 testing and if needed supplemental O2, de-escalation to oral antibiotics in 24 hours   [facility] Direct contact: see [email or web address] (further directions at bottom of note if needed) 7PM-7AM contact night coverage as at bottom of note 12/20/2018, 10:34 AM  LOS: 1 day   Consultants  . None  Procedures  . None  Antibiotics  . IV ceftriaxone-7/22 . IV azithromycin-7/22  Interval History/Subjective  Still having cough productive of yellow sputum but decreased in frequency from yesterday.  Feeling much better.  Objective   Vitals:  Vitals:   12/20/18 0801 12/20/18 0855  BP:  125/70  Pulse:  82  Resp:  18  Temp:  98.2 F (36.8 C)  SpO2: 94% (!) 88%    Exam:  Awake Alert, Oriented X 3, No new F.N deficits, Normal affect [place].A Supple Neck,No JVD, Symmetrical Chest wall movement, clear breath sounds, slightly diminished breath sounds at right base, on room air RRR,No Gallops,Rubs or new Murmurs, No Parasternal Heave +ve B.Sounds, Abd Soft, No tenderness, No organomegaly appriciated, No rebound - guarding or rigidity. No Cyanosis, Clubbing or edema, No new Rash or bruise     I have personally reviewed the following:  Data Reviewed: Basic Metabolic Panel: Recent Labs  Lab 12/19/18 1121  NA 135  K 4.1  CL 104  CO2 19*  GLUCOSE 113*  BUN 14  CREATININE  1.23*  CALCIUM 9.1   Liver Function Tests: Recent Labs  Lab 12/19/18 1121  AST 42*  ALT 34  ALKPHOS 157*  BILITOT 0.9  PROT 7.2  ALBUMIN 3.1*   No results for input(s): LIPASE, AMYLASE in the last 168 hours. No results for input(s): AMMONIA in the last 168 hours. CBC: Recent Labs  Lab 12/19/18 1121 12/20/18 0616  WBC 16.4* 12.5*  NEUTROABS 13.5*  --   HGB 13.0 16.2*  HCT 40.7 50.8*  MCV 90.6 89.9  PLT 388 235   Cardiac Enzymes: No results for input(s): CKTOTAL, CKMB, CKMBINDEX, TROPONINI in the last 168 hours. BNP (last 3 results) No results for input(s): BNP in the last 8760 hours.  ProBNP (last 3 results) No results for input(s): PROBNP in the last 8760 hours.  CBG: No results for input(s): GLUCAP in the last 168 hours.  Recent Results (from the past 240 hour(s))  SARS Coronavirus 2 (CEPHEID- Performed in [place] hospital lab), Hosp Order     Status: None   Collection Time: 12/19/18 10:33 AM   Specimen: Nasopharyngeal Swab  Result Value Ref Range Status   SARS Coronavirus 2 NEGATIVE NEGATIVE Final    Comment: (NOTE) If result is NEGATIVE SARS-CoV-2 target nucleic acids are NOT DETECTED. The SARS-CoV-2 RNA is generally detectable in upper and lower  respiratory specimens during the acute phase of infection. The lowest  concentration of SARS-CoV-2 viral copies this assay can detect is 250  copies / mL. A negative result does not preclude SARS-CoV-2 infection  and should not be used as the sole basis for treatment or other  patient management decisions.  A negative result may occur with  improper specimen collection / handling, submission of specimen other  than nasopharyngeal swab, presence of viral mutation(s) within the  areas targeted by this assay, and inadequate number of viral copies  (<250 copies / mL). A negative result must be combined with clinical  observations, patient history, and epidemiological information. If result is POSITIVE  SARS-CoV-2 target nucleic acids are DETECTED. The SARS-CoV-2 RNA is generally detectable in upper and lower  respiratory specimens dur ing the acute phase of infection.  Positive  results are indicative of active infection with SARS-CoV-2.  Clinical  correlation with patient history and other diagnostic information is  necessary to determine patient infection status.  Positive results do  not rule out bacterial infection or co-infection with other viruses. If result is PRESUMPTIVE POSTIVE SARS-CoV-2 nucleic acids MAY BE PRESENT.   A presumptive positive result was obtained on the submitted specimen  and confirmed on repeat testing.  While 2019 novel coronavirus  (SARS-CoV-2) nucleic acids may be present in the submitted sample  additional confirmatory testing may be necessary for epidemiological  and / or clinical management purposes  to differentiate between  SARS-CoV-2 and other Sarbecovirus currently known to infect humans.  If clinically indicated additional testing with an alternate test  methodology [phone]) is advised. The SARS-CoV-2 RNA is generally  detectable in upper and lower respiratory sp ecimens during the acute  phase of infection. The expected result is Negative. Fact Sheet for Patients:  StrictlyIdeas.no Fact Sheet for Healthcare Providers: BankingDealers.co.za This test is not yet approved or cleared by the [place] FDA and has been authorized for detection and/or diagnosis of SARS-CoV-2 by  FDA under an Emergency Use Authorization (EUA).  This EUA will remain in effect (meaning this test can be used) for the duration of the COVID-19 declaration under Section 564(b)(1) of the Act, 21 U.S.C. section 360bbb-3(b)(1), unless the authorization is terminated or revoked sooner. Performed at [facility], [place] [address] [place], [place] [address]      Studies: Dg Chest Port 1 View  Result Date: 12/19/2018  CLINICAL DATA:  Shortness of breath, cough EXAM: PORTABLE CHEST 1 VIEW COMPARISON:  11/17/2017 FINDINGS: Mild cardiomegaly. Airspace opacity noted at the right lung base could reflect atelectasis. Pneumonia is not excluded. No confluent opacity on the left. No effusions. No acute bony abnormality. IMPRESSION: Mild cardiomegaly. Right basilar atelectasis versus pneumonia. Electronically Signed   By: Rolm Baptise M.D.   On: 12/19/2018 11:53    Scheduled Meds: . azithromycin  500 mg Oral Daily  . cholecalciferol  4,000 Units Oral Daily  . enoxaparin (LOVENOX) injection  40 mg Subcutaneous Q24H  . gabapentin  600 mg Oral TID  . ipratropium-albuterol  3 mL Nebulization TID  . mometasone-formoterol  2 puff Inhalation BID  . nortriptyline  150 mg Oral QHS  . predniSONE  40 mg Oral Q breakfast   Continuous Infusions: . cefTRIAXone (ROCEPHIN)  IV 1 g (12/19/18 1852)  . dextrose 5% lactated ringers 50 mL/hr at 12/19/18 1936    Principal Problem:   Pneumonia Active Problems:   COPD (chronic obstructive pulmonary disease) with chronic bronchitis (HCC)   Cancer of right breast, stage 0   GERD (gastroesophageal reflux disease)   Respiratory failure ([place])      Desiree Hane  [facility]

## 2018-12-21 LAB — LEGIONELLA PNEUMOPHILA SEROGP 1 UR AG: L. pneumophila Serogp 1 Ur Ag: NEGATIVE

## 2018-12-21 MED ORDER — PREDNISONE 20 MG PO TABS: 40.0000 mg | ORAL_TABLET | Freq: Every day | ORAL | 0 refills | Status: DC

## 2018-12-21 MED ORDER — PREDNISONE 20 MG PO TABS: 40.0000 mg | ORAL_TABLET | Freq: Every day | ORAL | 0 refills | Status: AC

## 2018-12-21 MED ORDER — AZITHROMYCIN 500 MG PO TABS: 500.0000 mg | ORAL_TABLET | Freq: Every day | ORAL | 0 refills | Status: DC

## 2018-12-21 MED ORDER — CEFDINIR 300 MG PO CAPS: 300.0000 mg | ORAL_CAPSULE | Freq: Two times a day (BID) | ORAL | 0 refills | Status: AC

## 2018-12-21 NOTE — Care Management Important Message (Signed)
Important Message  Patient Details  Name: Katie Zimmerman MRN: [id number] Date of Birth: 1946-02-03   Medicare Important Message Given:  Yes     Orbie Pyo 12/21/2018, 2:01 PM

## 2018-12-21 NOTE — Progress Notes (Signed)
SATURATION QUALIFICATIONS:  Patient Saturations on Room Air at Rest = 91%  Patient Saturations on [employer] while Ambulating = 94%  [name], RN

## 2018-12-21 NOTE — Discharge Summary (Addendum)
Katie Zimmerman:[id number] DOB: 1945/11/27 DOA: 12/19/2018  PCP: [name], MD  Admit date: 12/19/2018 Discharge date: 12/21/2018   Home Health:No Equipment/Devices:none  Discharge Condition:Stable CODE STATUS:FULL Diet recommendation: Heart Healthy  Admitted From: home Disposition:  home  Recommendations for Outpatient Follow-up:  1. Follow up with PCP in 1 week, check BMP and CBC 2. Cefdenir, prednisone, azithromycin on discharge 3.     Discharge Diagnoses:  Active Hospital Problems   Diagnosis Date Noted  . Pneumonia 12/19/2018  . Respiratory failure ([place]) 12/19/2018  . COPD with acute exacerbation ([place]) 12/30/2016  . GERD (gastroesophageal reflux disease)   . Cancer of right breast, stage 0 01/26/2012  . COPD (chronic obstructive pulmonary disease) with chronic bronchitis ([place]) 05/04/2009    Resolved Hospital Problems  No resolved problems to display.    History of present illness:  Katie Zimmerman is a 73 y.o. year old female with medical history significant for breast cancer, fibromyalgia, GERD, osteoporosis and COPD who presented on 12/19/2018 with 1 week progressively worsening cough rectal of yellow/greenish sputum with associated wheezing and decreased appetite not improving with home inhaler use and was found to have acute hypoxic respiratory failure secondary to right lower lobe pneumonia and COPD (chronic bronchitis) exacerbation.  ED clinical summary: In the ED patient was found to have resting oxygen saturation of 80% requiring 2 L to improve to 90% prompting admission given chest x-ray showed concern for a right lower lobe opacity/atelectasis treated with IV antibiotics, supplemental oxygen and scheduled inhaler regimen.   Remaining hospital course addressed in problem based format below:   Hospital Course:    Acute hypoxic respiratory failure secondary to right lower lobe CAP, present on admission, improving.    In the ED hypoxic to 80s with O2  saturation requiring 2L.  CXR consistent with RLL pna  Was able to wean O2 and passed ambulatory O2 test on day of discharge maintained O2 sat at 94% with ambulation on room air  Did well on empiric azithromycin and ceftriaxone, Strep pna and legionella negative  On discharge will continue cefdenir for additional 5 days and azithromycin  Prednisone on discharge for COPD exacerbation  Close f/u with PCP instructed   Acute exacerbation of COPD/chronic bronchitis  No longer wheezing  Did well with antibiotics above  Can resume home inhalers, complete prednisone burst on discharge  Flutter valve and incentive spirometry for support   AKI, likely prerenal,   likely related to diminished oral intake in setting of chronic cough,  Improved with fluid resuscitation, baseline creatinine 0.7-0.8).    Admission creatinine 1.23, improved to 1.03 on discharge, needs BMP on hospital follow up   Leukocytosis.    16.4 on admission, 14.3 on discharge    Has remained afebrile with no other signs of Sirs/sepsis.  Likely reactive.     Right-sided postherpetic neuralgia, stable.    Followed by outpatient neurology.  Continue home nortriptyline, gabapentin continue nortriptyline   Consultations:  none  Procedures/Studies: None  Discharge Exam: BP (!) 147/82 (BP Location: Left Arm)   Pulse 76   Temp 98.3 F (36.8 C) (Oral)   Resp 16   Ht 5\' 4"  (1.626 m)   Wt 77.7 kg   SpO2 95%   BMI 29.40 kg/m   General: Lying in bed, no apparent distress Eyes: EOMI, anicteric ENT: Oral Mucosa clear and moist Cardiovascular: regular rate and rhythm, no murmurs, rubs or gallops, no edema, Respiratory: Normal respiratory effort, lungs clear to auscultation bilaterally,  on room air Abdomen: soft, non-distended, non-tender, normal bowel sounds Skin: No Rash Neurologic: Grossly no focal neuro deficit.Mental status AAOx3, speech normal, Psychiatric:Appropriate affect, and  mood     Discharge Diagnoses:  Principal Problem:   Pneumonia Active Problems:   COPD (chronic obstructive pulmonary disease) with chronic bronchitis (HCC)   Cancer of right breast, stage 0   GERD (gastroesophageal reflux disease)   COPD with acute exacerbation (HCC)   Respiratory failure (HCC)    Discharge Instructions  Discharge Instructions    Diet - low sodium heart healthy   Complete by: As directed    Increase activity slowly   Complete by: As directed      Allergies as of 12/21/2018      Reactions   Penicillins Anaphylaxis, Rash, Other (See Comments)   Has patient had a PCN reaction causing immediate rash, facial/tongue/throat swelling, SOB or lightheadedness with hypotension: Yes Has patient had a PCN reaction causing severe rash involving mucus membranes or skin necrosis: No Has patient had a PCN reaction that required hospitalization No Has patient had a PCN reaction occurring within the last 10 years: No If all of the above answers are "NO", then may proceed with Cephalosporin use.   Venlafaxine Nausea Only   Sulfonamide Derivatives Rash      Medication List    TAKE these medications   acetaminophen 500 MG tablet Commonly known as: TYLENOL Take 1,000 mg by mouth every 4 (four) hours as needed for mild pain, moderate pain or headache.   albuterol 108 (90 Base) MCG/ACT inhaler Commonly known as: VENTOLIN HFA Inhale 2 puffs into the lungs every 6 (six) hours as needed for wheezing or shortness of breath.   azithromycin 500 MG tablet Commonly known as: ZITHROMAX Take 1 tablet (500 mg total) by mouth daily.   benzonatate 100 MG capsule Commonly known as: TESSALON Take 1 capsule (100 mg total) by mouth 2 (two) times daily as needed for cough.   cefdinir 300 MG capsule Commonly known as: OMNICEF Take 1 capsule (300 mg total) by mouth 2 (two) times daily for 5 days.   cholecalciferol 1000 units tablet Commonly known as: VITAMIN D Take 4,000 Units by  mouth daily.   Fluticasone-Umeclidin-Vilant 100-62.5-25 MCG/INH Aepb Commonly known as: Trelegy Ellipta Inhale 1 puff into the lungs daily. Rinse mouth.   gabapentin 600 MG tablet Commonly known as: NEURONTIN Take 2 tablets three times a day What changed:   how much to take  how to take this  when to take this  additional instructions   nortriptyline 50 MG capsule Commonly known as: PAMELOR Take 3 capsules every night What changed:   how much to take  how to take this  when to take this  additional instructions   predniSONE 20 MG tablet Commonly known as: DELTASONE Take 2 tablets (40 mg total) by mouth daily with breakfast for 3 days.   traMADol 50 MG tablet Commonly known as: ULTRAM Take 1 tablet every night for 5 days       Allergies  Allergen Reactions  . Penicillins Anaphylaxis, Rash and Other (See Comments)    Has patient had a PCN reaction causing immediate rash, facial/tongue/throat swelling, SOB or lightheadedness with hypotension: Yes Has patient had a PCN reaction causing severe rash involving mucus membranes or skin necrosis: No Has patient had a PCN reaction that required hospitalization No Has patient had a PCN reaction occurring within the last 10 years: No If all of the above answers are "  NO", then may proceed with Cephalosporin use.  . Venlafaxine Nausea Only  . Sulfonamide Derivatives Rash        The results of significant diagnostics from this hospitalization (including imaging, microbiology, ancillary and laboratory) are listed below for reference.     Microbiology: Recent Results (from the past 240 hour(s))  SARS Coronavirus 2 (CEPHEID- Performed in [facility] hospital lab), Hosp Order     Status: None   Collection Time: 12/19/18 10:33 AM   Specimen: Nasopharyngeal Swab  Result Value Ref Range Status   SARS Coronavirus 2 NEGATIVE NEGATIVE Final    Comment: (NOTE) If result is NEGATIVE SARS-CoV-2 target nucleic acids are NOT  DETECTED. The SARS-CoV-2 RNA is generally detectable in upper and lower  respiratory specimens during the acute phase of infection. The lowest  concentration of SARS-CoV-2 viral copies this assay can detect is 250  copies / mL. A negative result does not preclude SARS-CoV-2 infection  and should not be used as the sole basis for treatment or other  patient management decisions.  A negative result may occur with  improper specimen collection / handling, submission of specimen other  than nasopharyngeal swab, presence of viral mutation(s) within the  areas targeted by this assay, and inadequate number of viral copies  (<250 copies / mL). A negative result must be combined with clinical  observations, patient history, and epidemiological information. If result is POSITIVE SARS-CoV-2 target nucleic acids are DETECTED. The SARS-CoV-2 RNA is generally detectable in upper and lower  respiratory specimens dur ing the acute phase of infection.  Positive  results are indicative of active infection with SARS-CoV-2.  Clinical  correlation with patient history and other diagnostic information is  necessary to determine patient infection status.  Positive results do  not rule out bacterial infection or co-infection with other viruses. If result is PRESUMPTIVE POSTIVE SARS-CoV-2 nucleic acids MAY BE PRESENT.   A presumptive positive result was obtained on the submitted specimen  and confirmed on repeat testing.  While 2019 novel coronavirus  (SARS-CoV-2) nucleic acids may be present in the submitted sample  additional confirmatory testing may be necessary for epidemiological  and / or clinical management purposes  to differentiate between  SARS-CoV-2 and other Sarbecovirus currently known to infect humans.  If clinically indicated additional testing with an alternate test  methodology [phone]) is advised. The SARS-CoV-2 RNA is generally  detectable in upper and lower respiratory sp ecimens during  the acute  phase of infection. The expected result is Negative. Fact Sheet for Patients:  StrictlyIdeas.no Fact Sheet for Healthcare Providers: BankingDealers.co.za This test is not yet approved or cleared by the [place] FDA and has been authorized for detection and/or diagnosis of SARS-CoV-2 by FDA under an Emergency Use Authorization (EUA).  This EUA will remain in effect (meaning this test can be used) for the duration of the COVID-19 declaration under Section 564(b)(1) of the Act, 21 U.S.C. section 360bbb-3(b)(1), unless the authorization is terminated or revoked sooner. Performed at [facility], [place] [address] [place], [place] [address]      Labs: BNP (last 3 results) No results for input(s): BNP in the last 8760 hours. Basic Metabolic Panel: Recent Labs  Lab 12/19/18 1121 12/20/18 1002 12/20/18 1046  NA 135 141 138  K 4.1 3.7 3.9  CL 104 106 106  CO2 19* 22 20*  GLUCOSE 113* 106* 186*  BUN 14 17 18   CREATININE 1.23* 1.01* 1.03*  CALCIUM 9.1 9.3 9.3  Liver Function Tests: Recent Labs  Lab 12/19/18 1121  AST 42*  ALT 34  ALKPHOS 157*  BILITOT 0.9  PROT 7.2  ALBUMIN 3.1*   No results for input(s): LIPASE, AMYLASE in the last 168 hours. No results for input(s): AMMONIA in the last 168 hours. CBC: Recent Labs  Lab 12/19/18 1121 12/20/18 0616 12/20/18 1046  WBC 16.4* 12.5* 14.3*  NEUTROABS 13.5*  --   --   HGB 13.0 16.2* 13.0  HCT 40.7 50.8* 41.1  MCV 90.6 89.9 90.7  PLT 388 235 427*   Cardiac Enzymes: No results for input(s): CKTOTAL, CKMB, CKMBINDEX, TROPONINI in the last 168 hours. BNP: Invalid input(s): POCBNP CBG: No results for input(s): GLUCAP in the last 168 hours. D-Dimer No results for input(s): DDIMER in the last 72 hours. Hgb A1c No results for input(s): HGBA1C in the last 72 hours. Lipid Profile No results for input(s): CHOL, HDL, LDLCALC, TRIG, CHOLHDL, LDLDIRECT in the  last 72 hours. Thyroid function studies No results for input(s): TSH, T4TOTAL, T3FREE, THYROIDAB in the last 72 hours.  Invalid input(s): FREET3 Anemia work up No results for input(s): VITAMINB12, FOLATE, FERRITIN, TIBC, IRON, RETICCTPCT in the last 72 hours. Urinalysis    Component Value Date/Time   COLORURINE YELLOW 03/05/2016 1655   APPEARANCEUR CLOUDY (A) 03/05/2016 1655   LABSPEC 1.020 03/05/2016 1655   PHURINE 6.0 03/05/2016 1655   GLUCOSEU NEGATIVE 03/05/2016 1655   HGBUR MODERATE (A) 03/05/2016 1655   BILIRUBINUR negative 03/15/2016 1604   KETONESUR 15 (A) 03/05/2016 1655   PROTEINUR trace 03/15/2016 1604   PROTEINUR 100 (A) 03/05/2016 1655   UROBILINOGEN 1.0 03/15/2016 1604   UROBILINOGEN 1.0 03/05/2016 1339   NITRITE negative 03/15/2016 1604   NITRITE POSITIVE (A) 03/05/2016 1655   LEUKOCYTESUR Trace (A) 03/15/2016 1604   Sepsis Labs Invalid input(s): PROCALCITONIN,  WBC,  LACTICIDVEN Microbiology Recent Results (from the past 240 hour(s))  SARS Coronavirus 2 (CEPHEID- Performed in [place] hospital lab), Hosp Order     Status: None   Collection Time: 12/19/18 10:33 AM   Specimen: Nasopharyngeal Swab  Result Value Ref Range Status   SARS Coronavirus 2 NEGATIVE NEGATIVE Final    Comment: (NOTE) If result is NEGATIVE SARS-CoV-2 target nucleic acids are NOT DETECTED. The SARS-CoV-2 RNA is generally detectable in upper and lower  respiratory specimens during the acute phase of infection. The lowest  concentration of SARS-CoV-2 viral copies this assay can detect is 250  copies / mL. A negative result does not preclude SARS-CoV-2 infection  and should not be used as the sole basis for treatment or other  patient management decisions.  A negative result may occur with  improper specimen collection / handling, submission of specimen other  than nasopharyngeal swab, presence of viral mutation(s) within the  areas targeted by this assay, and inadequate number of viral  copies  (<250 copies / mL). A negative result must be combined with clinical  observations, patient history, and epidemiological information. If result is POSITIVE SARS-CoV-2 target nucleic acids are DETECTED. The SARS-CoV-2 RNA is generally detectable in upper and lower  respiratory specimens dur ing the acute phase of infection.  Positive  results are indicative of active infection with SARS-CoV-2.  Clinical  correlation with patient history and other diagnostic information is  necessary to determine patient infection status.  Positive results do  not rule out bacterial infection or co-infection with other viruses. If result is PRESUMPTIVE POSTIVE SARS-CoV-2 nucleic acids MAY BE PRESENT.  A presumptive positive result was obtained on the submitted specimen  and confirmed on repeat testing.  While 2019 novel coronavirus  (SARS-CoV-2) nucleic acids may be present in the submitted sample  additional confirmatory testing may be necessary for epidemiological  and / or clinical management purposes  to differentiate between  SARS-CoV-2 and other Sarbecovirus currently known to infect humans.  If clinically indicated additional testing with an alternate test  methodology [phone]) is advised. The SARS-CoV-2 RNA is generally  detectable in upper and lower respiratory sp ecimens during the acute  phase of infection. The expected result is Negative. Fact Sheet for Patients:  StrictlyIdeas.no Fact Sheet for Healthcare Providers: BankingDealers.co.za This test is not yet approved or cleared by the [place] FDA and has been authorized for detection and/or diagnosis of SARS-CoV-2 by FDA under an Emergency Use Authorization (EUA).  This EUA will remain in effect (meaning this test can be used) for the duration of the COVID-19 declaration under Section 564(b)(1) of the Act, 21 U.S.C. section 360bbb-3(b)(1), unless the authorization is terminated  or revoked sooner. Performed at [facility], [place] [address], [place], [place] [address]      Time coordinating discharge: Over 30 minutes  SIGNED:   Desiree Hane, MD  [facility] 12/21/2018, 12:27 PM Pager   If 7PM-7AM, please contact night-coverage [email or web address] Password [email or web address]

## 2018-12-21 NOTE — Progress Notes (Signed)
DISCHARGE NOTE HOME Katie Zimmerman to be discharged Home per MD order. Discussed prescriptions and follow up appointments with the patient. Prescriptions given to patient; medication list explained in detail. Patient verbalized understanding.  Skin clean, dry and intact without evidence of skin break down, no evidence of skin tears noted. IV catheter discontinued intact. Site without signs and symptoms of complications. Dressing and pressure applied. Pt denies pain at the site currently. No complaints noted.  Patient free of lines, drains, and wounds.   An After Visit Summary (AVS) was printed and given to the patient. Patient escorted via wheelchair, and discharged home via private auto.  [name], RN

## 2018-12-24 ENCOUNTER — Telehealth: Payer: Self-pay

## 2018-12-24 NOTE — Telephone Encounter (Signed)
Left message for patient to call back to complete TCM call and schedule appointment with Dr. Diona Browner. Virtual?

## 2018-12-25 NOTE — Telephone Encounter (Signed)
[facility] Night - Client Nonclinical Telephone Record AccessNurse Client [place] Night - Client Client Site [place] - Night Physician Eliezer Lofts - MD Contact Type Call Who Is Calling Patient / Member / Family / Caregiver Caller Name Jazzmyn Filion Caller Phone Number [phone] Call Type Message Only Information Provided Reason for Call Returning a Call from the Office Initial Comment Caller missed a call from the office. Additional Comment Call Closed By: Hassel Neth Transaction Date/Time: 12/24/2018 5:47:28 PM (ET)

## 2018-12-25 NOTE — Telephone Encounter (Signed)
Left message for patient to call back  

## 2018-12-26 NOTE — Telephone Encounter (Addendum)
Transition Care Management Follow-up Telephone Call   Date discharged?   How have you been since you were released from the hospital? Some better. Cough is better, coughing some. Trying to take is easy. Having some dizziness. Weakness has gotten better.   Do you understand why you were in the hospital? yes   Do you understand the discharge instructions? yes   Where were you discharged to? Home-niece has been helping   Items Reviewed:  Medications reviewed: yes  Allergies reviewed: yes  Dietary changes reviewed: yes  Referrals reviewed: yes   Functional Questionnaire:   Activities of Daily Living (ADLs):   She states they are independent in the following: ambulation, dressing, toileting, bathing, hygiene, grooming. States they require assistance with the following: fixing food-getting help.   Any transportation issues/concerns?: no   Any patient concerns? Not at this time   Confirmed importance and date/time of follow-up visits scheduled yes  Provider Appointment booked with Dr. Diona Browner for virtual visit on August 4th  Confirmed with patient if condition begins to worsen call PCP or go to the ER.  Patient was given the office number and encouraged to call back with question or concerns.  : yes

## 2019-01-01 ENCOUNTER — Encounter: Payer: Self-pay | Admitting: Family Medicine

## 2019-01-01 ENCOUNTER — Ambulatory Visit (INDEPENDENT_AMBULATORY_CARE_PROVIDER_SITE_OTHER): Payer: Medicare Other | Admitting: Family Medicine

## 2019-01-01 VITALS — BP 128/86 | HR 97 | Temp 97.7°F | Ht 59.0 in

## 2019-01-01 DIAGNOSIS — J181 Lobar pneumonia, unspecified organism: Secondary | ICD-10-CM | POA: Diagnosis not present

## 2019-01-01 DIAGNOSIS — D72829 Elevated white blood cell count, unspecified: Secondary | ICD-10-CM

## 2019-01-01 DIAGNOSIS — J441 Chronic obstructive pulmonary disease with (acute) exacerbation: Secondary | ICD-10-CM

## 2019-01-01 DIAGNOSIS — N179 Acute kidney failure, unspecified: Secondary | ICD-10-CM | POA: Diagnosis not present

## 2019-01-01 DIAGNOSIS — J189 Pneumonia, unspecified organism: Secondary | ICD-10-CM

## 2019-01-01 HISTORY — DX: Elevated white blood cell count, unspecified: D72.829

## 2019-01-01 MED ORDER — PREDNISONE 20 MG PO TABS: ORAL_TABLET | ORAL | 0 refills | Status: DC

## 2019-01-01 NOTE — Assessment & Plan Note (Signed)
Still with residual  Wheeze and SOB, gradually improving. Repeat steroid taper.

## 2019-01-01 NOTE — Progress Notes (Signed)
VIRTUAL VISIT Due to national recommendations of social distancing due to [place] 19, a virtual visit is felt to be most appropriate for this patient at this time.   I connected with the patient on 01/01/19 at  4:00 PM EDT by virtual telehealth platform and verified that I am speaking with the correct person using two identifiers.   I discussed the limitations, risks, security and privacy concerns of performing an evaluation and management service by  virtual telehealth platform and the availability of in person appointments. I also discussed with the patient that there may be a patient responsible charge related to this service. The patient expressed understanding and agreed to proceed.  Patient location: Home Provider Location: [facility] Participants: Eliezer Lofts and [name]   Chief Complaint  Patient presents with  . Hospitalization Follow-up    History of Present Illness: 73 year old female presents for follow up hospitalization for PNA.  She was admitted 12/19/2018, discharged 12/21/2018 She presented with 1 week progressively worsening cough rectal of yellow/greenish sputum with associated wheezing and decreased appetite not improving with home inhaler useand was found to haveacute hypoxic respiratory failure secondary to right lower lobe pneumonia and COPD (chronic bronchitis) exacerbation.  Hospital Course:    Acute hypoxic respiratory failure secondary to right lower lobe CAP, present on admission, improving. ? In the ED hypoxic to 80s with O2 saturation requiring 2L.  CXR consistent with RLL pna ? Was able to wean O2 and passed ambulatory O2 test on day of discharge maintained O2 sat at 94% with ambulation on room air ? Did well on empiric azithromycin and ceftriaxone, Strep pna and legionella negative ? On discharge will continue cefdenir for additional 5 days and azithromycin ? Prednisone on discharge for COPD exacerbation  Acute exacerbation of  COPD/chronic bronchitis ? No longer wheezing ? Did well with antibiotics above ? Can resume home inhalers, complete prednisone burst on discharge ? Flutter valve and incentive spirometry for support   AKI, likely prerenal,  ? likely related to diminished oral intake in setting of chronic cough, ? Improved with fluid resuscitation, baseline creatinine 0.7-0.8).  ? Admission creatinine 1.23, improved to 1.03 on discharge, needs BMP on hospital follow up   Leukocytosis.  ? 16.4 on admission, 14.3 on discharge   Has remained afebrile with no other signs of Sirs/sepsis. Likely reactive.     TODAY: she reports has had continued improvement. Not up to baseline yet.  Still mild SOB and wheeze. Coughing off and on.  Using inhaler 3-4 days.  Has compelted antibiotics and steroids. Yesterday woke up with headache...used BC . Alter in day tylenol..helped it resolved.  COVID 19 screen No recent travel or known exposure to [place]  The importance of social distancing was discussed today.   Review of Systems  Constitutional: Negative for chills and fever.  HENT: Negative for congestion and ear pain.   Eyes: Negative for pain and redness.  Respiratory: Positive for cough, sputum production and shortness of breath.   Cardiovascular: Negative for chest pain, palpitations and leg swelling.  Gastrointestinal: Negative for abdominal pain, blood in stool, constipation, diarrhea, nausea and vomiting.  Genitourinary: Negative for dysuria.  Musculoskeletal: Negative for falls and myalgias.  Skin: Negative for rash.  Neurological: Negative for dizziness.  Psychiatric/Behavioral: Negative for depression. The patient is not nervous/anxious.       Past Medical History:  Diagnosis Date  . Breast cancer ([place]) 12/2011  . Breast cancer ([place]) 12/2011   DCIS  .  Complication of anesthesia    hard to wake up age 57-not since  . COPD (chronic obstructive pulmonary disease) ([place])   . Fibromyalgia    . GERD (gastroesophageal reflux disease)   . History of radiation therapy 02/29/12 -04/21/12   right breast, 5040 cGy in 28 fx, boost to total 6240 cGy  . Osteoporosis   . Personal history of colonic polyps-adenomas 05/25/2012  . Personal history of radiation therapy   . PHN (postherpetic neuralgia) 2000   Right side of face  . Pneumonia 12/19/2018    reports that she has never smoked. She has never used smokeless tobacco. She reports current alcohol use. She reports that she does not use drugs.   Current Outpatient Medications:  .  acetaminophen (TYLENOL) 500 MG tablet, Take 1,000 mg by mouth every 4 (four) hours as needed for mild pain, moderate pain or headache. , Disp: , Rfl:  .  albuterol (PROVENTIL HFA;VENTOLIN HFA) 108 (90 Base) MCG/ACT inhaler, Inhale 2 puffs into the lungs every 6 (six) hours as needed for wheezing or shortness of breath., Disp: 1 Inhaler, Rfl: 12 .  benzonatate (TESSALON) 100 MG capsule, Take 1 capsule (100 mg total) by mouth 2 (two) times daily as needed for cough., Disp: 20 capsule, Rfl: 0 .  cholecalciferol (VITAMIN D) 1000 units tablet, Take 4,000 Units by mouth daily., Disp: , Rfl:  .  Fluticasone-Umeclidin-Vilant (TRELEGY ELLIPTA) 100-62.5-25 MCG/INH AEPB, Inhale 1 puff into the lungs daily. Rinse mouth., Disp: 1 each, Rfl: 12 .  gabapentin (NEURONTIN) 600 MG tablet, Take 2 tablets three times a day, Disp: 180 tablet, Rfl: 11 .  nortriptyline (PAMELOR) 50 MG capsule, Take 3 capsules every night, Disp: 90 capsule, Rfl: 11 .  traMADol (ULTRAM) 50 MG tablet, Take 1 tablet every night for 5 days (Patient not taking: Reported on 01/01/2019), Disp: 5 tablet, Rfl: 0   Observations/Objective: Blood pressure 128/86, pulse 97, temperature 97.7 F (36.5 C), temperature source Oral, height 4\' 11"  (1.499 m), SpO2 94 %.  Physical Exam  ,Physical Exam Constitutional:      General: The patient is not in acute distress. Pulmonary:     Effort: Pulmonary effort is normal.  No respiratory distress. Talking in complete sentences, no tachypnea Neurological:     Mental Status: The patient is alert and oriented to person, place, and time.  Psychiatric:        Mood and Affect: Mood normal.        Behavior: Behavior normal.   Assessment and Plan    I discussed the assessment and treatment plan with the patient. The patient was provided an opportunity to ask questions and all were answered. The patient agreed with the plan and demonstrated an understanding of the instructions.   The patient was advised to call back or seek an in-person evaluation if the symptoms worsen or if the condition fails to improve as anticipated.   Pneumonia No current infectious symptoms.. no fever, no purulent sputum. Antibiotics completed.  COPD with acute exacerbation ([place]) Still with residual  Wheeze and SOB, gradually improving. Repeat steroid taper.  Acute renal failure ([place]) Re-eval BMET.  Leukocytosis Likely due to infection... re-eval for resolutions with cbc.    Eliezer Lofts, MD

## 2019-01-01 NOTE — Assessment & Plan Note (Signed)
No current infectious symptoms.. no fever, no purulent sputum. Antibiotics completed.

## 2019-01-01 NOTE — Assessment & Plan Note (Signed)
Likely due to infection... re-eval for resolutions with cbc.

## 2019-01-01 NOTE — Assessment & Plan Note (Signed)
Re-eval BMET 

## 2019-01-02 NOTE — Progress Notes (Signed)
Left message asking pt to call office 8/5/rbh

## 2019-01-03 ENCOUNTER — Telehealth: Payer: Self-pay

## 2019-01-03 NOTE — Telephone Encounter (Signed)
[place] Night - Client Nonclinical Telephone Record AccessNurse Client [facility] Night - Client Client Site [place] - Night Contact Type Call Who Is Calling Patient / Member / Family / Caregiver Caller Name Nyjai Graff Caller Phone Number [phone] Call Type Message Only Information Provided Reason for Call Returning a Call from the Office Initial [place] states she is returning a call from [place]. Additional Comment Office hours provided. Call Closed By: Christain [place] Transaction Date/Time: 01/02/2019 5:25:59 PM (ET)

## 2019-01-03 NOTE — Telephone Encounter (Signed)
Lab 8/7 Pt aware

## 2019-01-03 NOTE — Progress Notes (Signed)
8/7 labs Pt aware

## 2019-01-04 ENCOUNTER — Other Ambulatory Visit: Payer: Self-pay

## 2019-01-04 ENCOUNTER — Other Ambulatory Visit (INDEPENDENT_AMBULATORY_CARE_PROVIDER_SITE_OTHER): Payer: Medicare Other

## 2019-01-04 DIAGNOSIS — N179 Acute kidney failure, unspecified: Secondary | ICD-10-CM

## 2019-01-04 DIAGNOSIS — D72829 Elevated white blood cell count, unspecified: Secondary | ICD-10-CM | POA: Diagnosis not present

## 2019-01-04 LAB — CBC WITH DIFFERENTIAL/PLATELET
Basophils Absolute: 0.1 10*3/uL (ref 0.0–0.1)
Basophils Relative: 1.2 % (ref 0.0–3.0)
Eosinophils Absolute: 0 10*3/uL (ref 0.0–0.7)
Eosinophils Relative: 0.3 % (ref 0.0–5.0)
HCT: 39.1 % (ref 36.0–46.0)
Hemoglobin: 12.6 g/dL (ref 12.0–15.0)
Lymphocytes Relative: 19.5 % (ref 12.0–46.0)
Lymphs Abs: 2.1 10*3/uL (ref 0.7–4.0)
MCHC: 32.4 g/dL (ref 30.0–36.0)
MCV: 89.5 fl (ref 78.0–100.0)
Monocytes Absolute: 0.9 10*3/uL (ref 0.1–1.0)
Monocytes Relative: 7.9 % (ref 3.0–12.0)
Neutro Abs: 7.8 10*3/uL — ABNORMAL HIGH (ref 1.4–7.7)
Neutrophils Relative %: 71.1 % (ref 43.0–77.0)
Platelets: 436 10*3/uL — ABNORMAL HIGH (ref 150.0–400.0)
RBC: 4.36 Mil/uL (ref 3.87–5.11)
RDW: 14.9 % (ref 11.5–15.5)
WBC: 10.9 10*3/uL — ABNORMAL HIGH (ref 4.0–10.5)

## 2019-01-04 LAB — BASIC METABOLIC PANEL
BUN: 14 mg/dL (ref 6–23)
CO2: 24 mEq/L (ref 19–32)
Calcium: 10.1 mg/dL (ref 8.4–10.5)
Chloride: 105 mEq/L (ref 96–112)
Creatinine, Ser: 0.93 mg/dL (ref 0.40–1.20)
GFR: 59.1 mL/min — ABNORMAL LOW (ref >60.00)
Glucose, Bld: 130 mg/dL — ABNORMAL HIGH (ref 70–99)
Potassium: 3.8 mEq/L (ref 3.5–5.1)
Sodium: 141 mEq/L (ref 135–145)

## 2019-01-14 ENCOUNTER — Telehealth (INDEPENDENT_AMBULATORY_CARE_PROVIDER_SITE_OTHER): Payer: Medicare Other | Admitting: Neurology

## 2019-01-14 ENCOUNTER — Encounter: Payer: Self-pay | Admitting: Neurology

## 2019-01-14 ENCOUNTER — Other Ambulatory Visit: Payer: Self-pay

## 2019-01-14 VITALS — Ht 59.0 in | Wt 168.0 lb

## 2019-01-14 DIAGNOSIS — B0229 Other postherpetic nervous system involvement: Secondary | ICD-10-CM | POA: Diagnosis not present

## 2019-01-14 DIAGNOSIS — G500 Trigeminal neuralgia: Secondary | ICD-10-CM

## 2019-01-14 MED ORDER — GABAPENTIN 600 MG PO TABS: ORAL_TABLET | ORAL | 11 refills | Status: DC

## 2019-01-14 MED ORDER — NORTRIPTYLINE HCL 50 MG PO CAPS: ORAL_CAPSULE | ORAL | 11 refills | Status: DC

## 2019-01-14 MED ORDER — TRAMADOL HCL 50 MG PO TABS: ORAL_TABLET | ORAL | 5 refills | Status: DC

## 2019-01-14 NOTE — Progress Notes (Signed)
Virtual Visit via Video Note The purpose of this virtual visit is to provide medical care while limiting exposure to the novel coronavirus.    Consent was obtained for video visit:  Yes.   Answered questions that patient had about telehealth interaction:  Yes.   I discussed the limitations, risks, security and privacy concerns of performing an evaluation and management service by telemedicine. I also discussed with the patient that there may be a patient responsible charge related to this service. The patient expressed understanding and agreed to proceed.  Pt location: Home Physician Location: office Name of referring provider:  [name], MD I connected with Katie Zimmerman at patients initiation/request on 01/14/2019 at  2:00 PM EDT by video enabled telemedicine application and verified that I am speaking with the correct person using two identifiers. Pt MRN:  [id number] Pt DOB:  1945/11/11 Video Participants:  Katie Zimmerman   History of Present Illness:  The patient was seen as a virtual video visit on 01/14/2019. She was last evaluated 4 months ago for postherpetic neuralgia. One her last visit, she was having another flare with significant pain also affecting her tongue, making it difficult to eat. Pain radiated to her lip, right jaw, and under the right cheek and ear. Pain persisted despite gabapentin 1200mg  TID and nortriptyline 150mg  qhs. She was given a 5-day course of Tramadol which significantly helped. She only took 1/2 tab of Tramadol 50mg , the whole tablet made her loopy. She was pain-free until the past month, she again has pain on her right cheek. She was recently admitted to the hospital for pneumonia and now has recurrence of cough, no fever. She denies any dizziness, no falls. She has noted the sole of her right shoe is worn compared to the other foot.  Her MRI face/trigeminal with and without contrast done 01/2018 showed poorly demonstrated cisternal segment of trigeminal nerve,  differential diagnosis includes anomalous course, edema, developmental variant, or old injury, no abnormal enhancement.  History on Initial Assessment 01/15/2018: This is a pleasant 73 year old right-handed woman with a history of breast cancer, presenting for postherpetic neuralgia. She had shingles affecting the right side of her face in 2000. She did well for a year or 2 with no symptoms then started having significant pain in the same distribution. She describes pain as pressure and tenderness when she washes or touches her cheek on the right side of her nose. It is painful to talk, with pain radiating from the right temporal region down the right side of her tongue. It is hard to eat and talk. Bending down seems to worsen pain. She does have a diagnosis of TMJ on the right as well. She was tried on different medications in the past few years and found gabapentin helped for a while, then stopped working. She tried Lyrica several years ago which also helped for a little while, then she saw a neurologist who increased to the dose to 200mg  but she never filled the higher dose because she developed kidney stones in 2011. Around that time, the pain was not bothering her so much, but when pain recurred, she was restarted on gabapentin which worked pretty good until recently. For the past few months, she has had a constant 10/10 pain (appears comfortable in the office today), when she has sharp pains, the pain would be more than a 10. Pain is worse at night, really bad around 9pm. She takes her gabapentin and can sleep. The last  few days she has increased gabapentin 300mg  taking 3 in AM, 2 at supper, 3 at bedtime. No drowsiness. She denies any tinnitus but feels she has lost some hearing in the right ear. No vision changes. For a time she was having headaches with pressure over the vertex, but none lately. No nausea/vomiting, dizziness, diplopia, dysarthria/dysphagia, neck pain, focal numbness/tingling/weakness in the  extremities, bowel/bladder dysfunction. Left side of face is unaffected. She has chronic back pain.      Current Outpatient Medications on File Prior to Visit  Medication Sig Dispense Refill   acetaminophen (TYLENOL) 500 MG tablet Take 1,000 mg by mouth every 4 (four) hours as needed for mild pain, moderate pain or headache.      albuterol (PROVENTIL HFA;VENTOLIN HFA) 108 (90 Base) MCG/ACT inhaler Inhale 2 puffs into the lungs every 6 (six) hours as needed for wheezing or shortness of breath. 1 Inhaler 12   benzonatate (TESSALON) 100 MG capsule Take 1 capsule (100 mg total) by mouth 2 (two) times daily as needed for cough. 20 capsule 0   cholecalciferol (VITAMIN D) 1000 units tablet Take 4,000 Units by mouth daily.     Fluticasone-Umeclidin-Vilant (TRELEGY ELLIPTA) 100-62.5-25 MCG/INH AEPB Inhale 1 puff into the lungs daily. Rinse mouth. 1 each 12   No current facility-administered medications on file prior to visit.      Observations/Objective:   Vitals:   01/14/19 1321  Weight: 168 lb (76.2 kg)  Height: 4\' 11"  (1.499 m)   GEN:  The patient appears stated age and is in NAD.  Neurological examination: Patient is awake, alert, oriented x 3. No aphasia or dysarthria. Intact fluency and comprehension. Remote and recent memory intact. Able to name and repeat. Cranial nerves: Extraocular movements intact with no nystagmus. No facial asymmetry. Motor: moves all extremities symmetrically, at least anti-gravity x 4. No incoordination on finger to nose testing. Gait: slow and cautious, no ataxia   Assessment and Plan:   This is a pleasant 73 yo RH woman with a history of breast cancer with right-sided post-herpetic neuralgia that flares up every few months. MRI face/trigeminal in 01/2018 had shown poorly demonstrated right cisternal segment of trigeminal nerve, no abnormal enhancement, unchanged from prior MRI in 2014, differential diagnosis includes anomalous course, edema, developmental  variant or old injury. She continues to have recurrent flares on high dose of gabapentin 1200mg  TID and nortriptyline 150mg  qhs. We discussed prn Tramadol 50mg  1/2 to 1 tab qhs for the flares, however due to continued recurrent pain, she would like an evaluation for potential surgical options if available. Referral will be sent to Neurosurgery. Follow-up in 6 months, she knows to call for any changes.    Follow Up Instructions:   -I discussed the assessment and treatment plan with the patient. The patient was provided an opportunity to ask questions and all were answered. The patient agreed with the plan and demonstrated an understanding of the instructions.   The patient was advised to call back or seek an in-person evaluation if the symptoms worsen or if the condition fails to improve as anticipated.   Cameron Sprang, MD

## 2019-01-15 ENCOUNTER — Other Ambulatory Visit: Payer: Self-pay

## 2019-01-15 DIAGNOSIS — G500 Trigeminal neuralgia: Secondary | ICD-10-CM

## 2019-01-16 ENCOUNTER — Telehealth: Payer: Self-pay

## 2019-01-16 ENCOUNTER — Other Ambulatory Visit: Payer: Self-pay

## 2019-01-16 ENCOUNTER — Ambulatory Visit (INDEPENDENT_AMBULATORY_CARE_PROVIDER_SITE_OTHER): Payer: Medicare Other | Admitting: Family Medicine

## 2019-01-16 ENCOUNTER — Encounter: Payer: Self-pay | Admitting: Family Medicine

## 2019-01-16 ENCOUNTER — Emergency Department (HOSPITAL_COMMUNITY): Payer: Medicare Other

## 2019-01-16 ENCOUNTER — Emergency Department (HOSPITAL_COMMUNITY)
Admission: EM | Admit: 2019-01-16 | Discharge: 2019-01-16 | Disposition: A | Discharge status: Home / Self Care | Payer: Medicare Other | Attending: Emergency Medicine | Admitting: Emergency Medicine

## 2019-01-16 VITALS — Temp 97.8°F | Ht 59.0 in | Wt 177.0 lb

## 2019-01-16 DIAGNOSIS — R0902 Hypoxemia: Secondary | ICD-10-CM | POA: Diagnosis not present

## 2019-01-16 DIAGNOSIS — Z923 Personal history of irradiation: Secondary | ICD-10-CM | POA: Insufficient documentation

## 2019-01-16 DIAGNOSIS — R0602 Shortness of breath: Secondary | ICD-10-CM | POA: Insufficient documentation

## 2019-01-16 DIAGNOSIS — R05 Cough: Secondary | ICD-10-CM

## 2019-01-16 DIAGNOSIS — Z853 Personal history of malignant neoplasm of breast: Secondary | ICD-10-CM | POA: Diagnosis not present

## 2019-01-16 DIAGNOSIS — J441 Chronic obstructive pulmonary disease with (acute) exacerbation: Secondary | ICD-10-CM | POA: Diagnosis not present

## 2019-01-16 DIAGNOSIS — Z79899 Other long term (current) drug therapy: Secondary | ICD-10-CM | POA: Diagnosis not present

## 2019-01-16 DIAGNOSIS — R5381 Other malaise: Secondary | ICD-10-CM | POA: Diagnosis not present

## 2019-01-16 DIAGNOSIS — Z20828 Contact with and (suspected) exposure to other viral communicable diseases: Secondary | ICD-10-CM | POA: Diagnosis not present

## 2019-01-16 DIAGNOSIS — R059 Cough, unspecified: Secondary | ICD-10-CM

## 2019-01-16 DIAGNOSIS — J189 Pneumonia, unspecified organism: Secondary | ICD-10-CM | POA: Diagnosis not present

## 2019-01-16 LAB — CBC
HCT: 39.5 % (ref 36.0–46.0)
Hemoglobin: 11.9 g/dL — ABNORMAL LOW (ref 12.0–15.0)
MCH: 28.5 pg (ref 26.0–34.0)
MCHC: 30.1 g/dL (ref 30.0–36.0)
MCV: 94.5 fL (ref 80.0–100.0)
Platelets: 314 10*3/uL (ref 150–400)
RBC: 4.18 MIL/uL (ref 3.87–5.11)
RDW: 15 % (ref 11.5–15.5)
WBC: 8 10*3/uL (ref 4.0–10.5)
nRBC: 0 % (ref 0.0–0.2)

## 2019-01-16 LAB — BASIC METABOLIC PANEL
Anion gap: 9 (ref 5–15)
BUN: 12 mg/dL (ref 8–23)
CO2: 25 mmol/L (ref 22–32)
Calcium: 9.3 mg/dL (ref 8.9–10.3)
Chloride: 107 mmol/L (ref 98–111)
Creatinine, Ser: 1.1 mg/dL — ABNORMAL HIGH (ref 0.44–1.00)
GFR calc Af Amer: 58 mL/min — ABNORMAL LOW (ref >60)
GFR calc non Af Amer: 50 mL/min — ABNORMAL LOW (ref >60)
Glucose, Bld: 114 mg/dL — ABNORMAL HIGH (ref 70–99)
Potassium: 3.8 mmol/L (ref 3.5–5.1)
Sodium: 141 mmol/L (ref 135–145)

## 2019-01-16 LAB — SARS CORONAVIRUS 2 (TAT 6-24 HRS): SARS Coronavirus 2: NEGATIVE

## 2019-01-16 MED ORDER — BENZONATATE 100 MG PO CAPS: 100.0000 mg | ORAL_CAPSULE | Freq: Two times a day (BID) | ORAL | 0 refills | Status: DC | PRN

## 2019-01-16 MED ORDER — PREDNISONE 20 MG PO TABS: ORAL_TABLET | ORAL | 0 refills | Status: DC

## 2019-01-16 MED ORDER — BENZONATATE 100 MG PO CAPS
100.0000 mg | ORAL_CAPSULE | Freq: Once | ORAL | Status: AC
  Administered 2019-01-16: 100 mg via ORAL
  Filled 2019-01-16: qty 1

## 2019-01-16 MED ORDER — ALBUTEROL SULFATE HFA 108 (90 BASE) MCG/ACT IN AERS
2.0000 | INHALATION_SPRAY | RESPIRATORY_TRACT | Status: DC | PRN
  Administered 2019-01-16: 2 via RESPIRATORY_TRACT
  Filled 2019-01-16: qty 6.7

## 2019-01-16 NOTE — Telephone Encounter (Signed)
Noted  

## 2019-01-16 NOTE — ED Notes (Signed)
Discharge paperwork reviewed with pt, including prescriptions.  Pt verbalized understanding, stated that she would notify staff when her ride is here so that she may be wheeled to exit.

## 2019-01-16 NOTE — ED Provider Notes (Signed)
[facility] DEPT Provider Note   CSN: [id number] Arrival date & time: 01/16/19  1022     History   Chief Complaint Chief Complaint  Patient presents with  . Cough  . Shortness of Breath    HPI Katie Zimmerman is a 73 y.o. female.     The history is provided by the patient. No language interpreter was used.  Cough Associated symptoms: shortness of breath   Shortness of Breath Associated symptoms: cough      73 year old female with history of breast cancer, COPD, fibromyalgia brought here via EMS for evaluation of cough.  Patient reports 2 weeks ago she was admitted to the hospital for pneumonia and stay for several days.  She was subsequently discharged home with steroid.  She reached out to her primary care doctor for follow-up and was given additional steroid.  She has been taking the medication with improvement.  However since yesterday her cough has returned.  She described cough mildly productive with sputum, with increased shortness of breath, wheezing and not feeling well.  She does not complain of any fever or chills no loss of taste or smell, no nausea vomiting or diarrhea and no hemoptysis.  She denies any recent sick contact with anyone with COVID-19.  Her symptom is moderate in severity.  She does use a rescue inhaler at home which did help.  She finished with a steroid 4 days ago.  Past Medical History:  Diagnosis Date  . Breast cancer ([place]) 12/2011  . Breast cancer ([place]) 12/2011   DCIS  . Complication of anesthesia    hard to wake up age 46-not since  . COPD (chronic obstructive pulmonary disease) ([place])   . Fibromyalgia   . GERD (gastroesophageal reflux disease)   . History of radiation therapy 02/29/12 -04/21/12   right breast, 5040 cGy in 28 fx, boost to total 6240 cGy  . Osteoporosis   . Personal history of colonic polyps-adenomas 05/25/2012  . Personal history of radiation therapy   . PHN (postherpetic neuralgia) 2000   Right side of  face  . Pneumonia 12/19/2018    Patient Active Problem List   Diagnosis Date Noted  . Acute renal failure ([place]) 01/01/2019  . Leukocytosis 01/01/2019  . Pneumonia 12/19/2018  . Cough, persistent 10/17/2017  . Headache 02/21/2017  . COPD with acute exacerbation ([place]) 12/30/2016  . BMI 33.0-33.9,adult 08/09/2016  . Increased endometrial stripe thickness 03/18/2016  . [place] arthritis 01/22/2015  . Counseling regarding end of life decision making 01/22/2015  . Osteoporosis 01/28/2014  . Hearing loss in right ear 01/20/2014  . Personal history of colonic polyps-adenomas 05/25/2012  . GERD (gastroesophageal reflux disease)   . History of radiation therapy   . Cancer of right breast, stage 0 01/26/2012  . Prediabetes 11/22/2011  . High cholesterol 11/22/2011  . TEMPOROMANDIBULAR JOINT PAIN 11/04/2009  . RENAL CALCULUS 05/04/2009  . Post herpetic neuralgia 04/14/2009    Past Surgical History:  Procedure Laterality Date  . APPENDECTOMY    . BIOPSY BREAST  01/10/12   Right breast needle cor biopsy  . BREAST BIOPSY  01/10/2012  . BREAST CYST ASPIRATION  01/10/2012  . BREAST LUMPECTOMY Right 01/26/2012   Right/ERPR+ DCIS, right ax snbx  . FOOT SURGERY  1996   rt/lt great toes  . GLAUCOMA SURGERY  1993   laser  . OVARIAN CYST SURGERY  1964  . TUBAL LIGATION  1976     OB History   No obstetric  history on file.      Home Medications    Prior to Admission medications   Medication Sig Start Date End Date Taking? Authorizing Provider  acetaminophen (TYLENOL) 500 MG tablet Take 1,000 mg by mouth every 4 (four) hours as needed for mild pain, moderate pain or headache.     [provider]  albuterol (PROVENTIL HFA;VENTOLIN HFA) 108 (90 Base) MCG/ACT inhaler Inhale 2 puffs into the lungs every 6 (six) hours as needed for wheezing or shortness of breath. 06/21/18   Young, Kasandra Knudsen, MD  benzonatate (TESSALON) 100 MG capsule Take 1 capsule (100 mg total) by mouth 2 (two) times  daily as needed for cough. 12/17/18   Lesleigh Noe, MD  cholecalciferol (VITAMIN D) 1000 units tablet Take 4,000 Units by mouth daily.    [provider]  Fluticasone-Umeclidin-Vilant (TRELEGY ELLIPTA) 100-62.5-25 MCG/INH AEPB Inhale 1 puff into the lungs daily. Rinse mouth. 12/18/17   Baird Lyons D, MD  gabapentin (NEURONTIN) 600 MG tablet Take 2 tablets three times a day 01/14/19   Cameron Sprang, MD  nortriptyline [facility]) 50 MG capsule Take 3 capsules every night 01/14/19   Cameron Sprang, MD  traMADol [facility]) 50 MG tablet Take 1/2 to 1 tablet every night as needed for severe pain 01/14/19   Cameron Sprang, MD    Family History Family History  Problem Relation Age of Onset  . Hypertension Mother   . Cancer Mother        LIVER cancer  . Hypertension Father   . Diabetes Father   . Heart failure Father   . Breast cancer Sister 30       NOT hormone receptor breast cancer  . Breast cancer Maternal Aunt 75  . Breast cancer Other        dx in her 49s  . Breast cancer Cousin 7       maternal cousin  . Breast cancer Cousin        maternal cousin  . Breast cancer Cousin        maternal cousin  . Cancer Cousin        unknown form of cancer; maternal cousin  . Cancer Cousin        unknown form of cancer; maternal cousin  . Cancer Cousin 11       ? lung cancer; maternal cousin  . Breast cancer Cousin        dx 19s-50s; paternal cousin  . Breast cancer Cousin        dx in her 64s; paternal cousin  . Colon cancer Neg Hx   . Stomach cancer Neg Hx     Social History Social History   Tobacco Use  . Smoking status: Never Smoker  . Smokeless tobacco: Never Used  Substance Use Topics  . Alcohol use: Yes    Comment: occasional glass of wine  . Drug use: No     Allergies   Penicillins, Venlafaxine, and Sulfonamide derivatives   Review of Systems Review of Systems  Respiratory: Positive for cough and shortness of breath.   All other systems reviewed and are  negative.    Physical Exam Updated Vital Signs BP 110/80   Pulse 87   Temp 97.8 F (36.6 C) (Oral)   Resp 17   SpO2 93%   Physical Exam Vitals signs and nursing note reviewed.  Constitutional:      General: She is not in acute distress.    Appearance: She is  well-developed.  HENT:     Head: Atraumatic.  Eyes:     Conjunctiva/sclera: Conjunctivae normal.  Neck:     Musculoskeletal: Neck supple.  Cardiovascular:     Rate and Rhythm: Normal rate and regular rhythm.  Pulmonary:     Effort: Pulmonary effort is normal.     Breath sounds: Examination of the right-lower field reveals rhonchi. Rhonchi present. No decreased breath sounds, wheezing or rales.  Chest:     Chest wall: No tenderness.  Abdominal:     Palpations: Abdomen is soft.  Musculoskeletal:     Right lower leg: No edema.     Left lower leg: No edema.  Skin:    Findings: No rash.  Neurological:     Mental Status: She is alert and oriented to person, place, and time.      ED Treatments / Results  Labs (all labs ordered are listed, but only abnormal results are displayed) Labs Reviewed  BASIC METABOLIC PANEL - Abnormal; Notable for the following components:      Result Value   Glucose, Bld 114 (*)    Creatinine, Ser 1.10 (*)    GFR calc non Af Amer 50 (*)    GFR calc Af Amer 58 (*)    All other components within normal limits  CBC - Abnormal; Notable for the following components:   Hemoglobin 11.9 (*)    All other components within normal limits  SARS CORONAVIRUS 2    EKG EKG Interpretation  Date/Time:  Wednesday January 16 2019 11:22:57 EDT Ventricular Rate:  78 PR Interval:    QRS Duration: 105 QT Interval:  382 QTC Calculation: 436 R Axis:   21 Text Interpretation:  Sinus rhythm Low voltage, precordial leads Confirmed by Davonna Belling [phone]) on 01/16/2019 1:03:11 PM   Radiology Dg Chest Port 1 View  Result Date: 01/16/2019 CLINICAL DATA:  Cough and shortness of breath EXAM:  PORTABLE CHEST 1 VIEW COMPARISON:  December 19, 2018 FINDINGS: There is mild cardiomegaly. There is been interval improvement in the patchy airspace opacity in the right lower lung. No new airspace opacity. No pleural effusion. Surgical clips overlying the right axilla. IMPRESSION: Interval improvement in the right lower lung opacity. Electronically Signed   By: Prudencio Pair M.D.   On: 01/16/2019 11:06    Procedures Procedures (including critical care time)  Medications Ordered in ED Medications  albuterol (VENTOLIN HFA) 108 (90 Base) MCG/ACT inhaler 2 puff (2 puffs Inhalation Given 01/16/19 1401)  benzonatate (TESSALON) capsule 100 mg (100 mg Oral Given 01/16/19 1117)     Initial Impression / Assessment and Plan / ED Course  I have reviewed the triage vital signs and the nursing notes.  Pertinent labs & imaging results that were available during my care of the patient were reviewed by me and considered in my medical decision making (see chart for details).        BP 110/80   Pulse 87   Temp 97.8 F (36.6 C) (Oral)   Resp 17   SpO2 93%    Final Clinical Impressions(s) / ED Diagnoses   Final diagnoses:  COPD exacerbation [facility])    ED Discharge Orders         Ordered    benzonatate (TESSALON) 100 MG capsule  2 times daily PRN     01/16/19 1458    predniSONE (DELTASONE) 20 MG tablet     01/16/19 1458         10:57 AM Patient recently treated  for pneumonia 2 weeks ago here with cough and increase shortness of breath.  Patient however well-appearing exam, she is afebrile, no hypoxia.  She does have some faint rhonchi/rales on the right lung base will obtain repeat chest x-ray.  Will screen for COVID-19 as well.  Work-up initiated.  2:55 PM Repeat chest x-ray shows interval improvement of her previous focal infiltrate.  Labs are reassuring no electrolyte derangement.  EKG unremarkable.  When ambulated the patient did not suffer of any hypoxia.  I felt she is stable to be  discharged home with steroids and cough medication and will follow-up probably with PCP.  Care discussed with Dr. Alvino Chapel.    Domenic Moras, PA-C 01/16/19 1459    Davonna Belling, MD 01/16/19 [phone]

## 2019-01-16 NOTE — ED Notes (Signed)
Pt ambulated in room.  Baseline O2 on room air 94%.  Pt able to ambulate in room appx 100 ft.  Pt O2 sats on room air did not go lower than 92% while ambulating.    Pt with multiple coughing spells while ambulating, stated that after coughing she felt "whoozy."

## 2019-01-16 NOTE — Progress Notes (Signed)
Virtual Visit via Video Note  I connected with [name] on 01/16/19 at  9:00 AM EDT by a video enabled telemedicine application and verified that I am speaking with the correct person using two identifiers. The patient was unable to connect via video due to equipment malfunction so the visit was completed with audio only.   Location: Patient: in her home Provider: [place]   I discussed the limitations of evaluation and management by telemedicine and the availability of in person appointments. The patient expressed understanding and agreed to proceed.  History of Present Illness: Chief Complaint  Patient presents with  . Cough    Very deep, raspy cough x 3 days with little mucous production, hoarseness and irritated throat from coughing. Denies fever.  Using [employer] for cough, little relief. Pt states that she just "feel bad"   This is a 73 yo female who requests virtual visit to discuss the above.  She was admitted to the hospital 7/22-24/2020 for pneumonia and AKI. She had hospital f/u virtually on 8/4 and was improving. She had labs on 8/7 with improved wbc and renal function.  Today she reports increased cough and SOB x 3 days. She is having intermittent wheezing. No known fever or chills but did have sweats last night. She has been out of her Triligy inhaler since prior to her hospitalization (due to cost) but has been using her albuterol inhaler 3-4 times a day with a little relief. She felt that she was getting better from a breathing and energy standpoint until 3 days ago.   Past Medical History:  Diagnosis Date  . Breast cancer ([place]) 12/2011  . Breast cancer ([place]) 12/2011   DCIS  . Complication of anesthesia    hard to wake up age 34-not since  . COPD (chronic obstructive pulmonary disease) ([place])   . Fibromyalgia   . GERD (gastroesophageal reflux disease)   . History of radiation therapy 02/29/12 -04/21/12   right breast, 5040 cGy in 28 fx, boost to total  6240 cGy  . Osteoporosis   . Personal history of colonic polyps-adenomas 05/25/2012  . Personal history of radiation therapy   . PHN (postherpetic neuralgia) 2000   Right side of face  . Pneumonia 12/19/2018   Past Surgical History:  Procedure Laterality Date  . APPENDECTOMY    . BIOPSY BREAST  01/10/12   Right breast needle cor biopsy  . BREAST BIOPSY  01/10/2012  . BREAST CYST ASPIRATION  01/10/2012  . BREAST LUMPECTOMY Right 01/26/2012   Right/ERPR+ DCIS, right ax snbx  . FOOT SURGERY  1996   rt/lt great toes  . GLAUCOMA SURGERY  1993   laser  . OVARIAN CYST SURGERY  1964  . TUBAL LIGATION  1976   Family History  Problem Relation Age of Onset  . Hypertension Mother   . Cancer Mother        LIVER cancer  . Hypertension Father   . Diabetes Father   . Heart failure Father   . Breast cancer Sister 78       NOT hormone receptor breast cancer  . Breast cancer Maternal Aunt 75  . Breast cancer Other        dx in her 43s  . Breast cancer Cousin 27       maternal cousin  . Breast cancer Cousin        maternal cousin  . Breast cancer Cousin        maternal cousin  .  Cancer Cousin        unknown form of cancer; maternal cousin  . Cancer Cousin        unknown form of cancer; maternal cousin  . Cancer Cousin 11       ? lung cancer; maternal cousin  . Breast cancer Cousin        dx 8s-50s; paternal cousin  . Breast cancer Cousin        dx in her 54s; paternal cousin  . Colon cancer Neg Hx   . Stomach cancer Neg Hx    Social History   Tobacco Use  . Smoking status: Never Smoker  . Smokeless tobacco: Never Used  Substance Use Topics  . Alcohol use: Yes    Comment: occasional glass of wine  . Drug use: No      Observations/Objective: The patient answers questions appropriately. She is able to speak in complete sentences. She sounds weak. She has a frequent barking cough.   Temp 97.8 F (36.6 C) (Oral)   Ht 4\' 11"  (1.499 m)   Wt 177 lb (80.3 kg)   BMI 35.75  kg/m  Wt Readings from Last 3 Encounters:  01/16/19 177 lb (80.3 kg)  01/14/19 168 lb (76.2 kg)  12/20/18 171 lb 4.8 oz (77.7 kg)    Assessment and Plan: 1. Cough - concerning with additional symptoms and recent hospitalization for pneumonia. Patient was advised to go to ER. She was agreeable. Not sure if she can get a ride. I offered to call an ambulance for her but she is going to check with her sister. I advised her that she can also call 911 for transportation.   2. SOB (shortness of breath) - see #1   Clarene Reamer, FNP-BC  [facility], [place]  01/16/2019 9:35 AM   Follow Up Instructions:    I discussed the assessment and treatment plan with the patient. The patient was provided an opportunity to ask questions and all were answered. The patient agreed with the plan and demonstrated an understanding of the instructions.   The patient was advised to call back or seek an in-person evaluation if the symptoms worsen or if the condition fails to improve as anticipated.  Elby Beck, FNP

## 2019-01-16 NOTE — Telephone Encounter (Signed)
Katie Zimmerman pts sister called to see if I could call 911 for pt to go to ED. I was on phone with another pt and Harriett held for short period and disconnected call. I called Harriett back and she said that her sister had already called 911 and ambulance was already at pts home. Harriet asked me to call the ED and let them know what pt was told at virtual visit this morning. When asked which ED pt was going to Harriett did not know so I explained I would need to know which ED to call but advised Reino Bellis that when pt go to ED by ambulance I was sure she would be triaged at ED and the ED would have access to pts chart.Marland Kitchen Harriett voiced understanding and was appreciative for cb. FYI to Glenda Chroman FNP.

## 2019-01-16 NOTE — ED Triage Notes (Signed)
Pt BIBA from home.   Per PTAR=- Pt dx with pneumonia 2 weeks ago.  Pt saw PCP last week and was told needed more prednisone.  Pt finished last dose of prednisone 8/14.  Pt c/o persistent cough started yesterday.

## 2019-01-17 ENCOUNTER — Telehealth: Payer: Self-pay | Admitting: Family Medicine

## 2019-01-17 NOTE — Telephone Encounter (Signed)
Pt seen by Tor Netters, NP on 01/16/2019 virtual visit for cough, was advised to go to ED, see OV notes.  Pt called EMS about 1hr after her appt with Jackelyn Poling - seen and treated in ED 01/16/2019 for cough/SOB and discharged home day.  Pt does not qualify for TCM, is scheduled for CPE on 03/05/2019 with Dr Diona Browner.   No follow up specified by the ED - Please advise if you wish to see this patient has a regular ED follow up.

## 2019-01-17 NOTE — Telephone Encounter (Signed)
No Ed follow up needed if she is doing better, but please call  To check on her and offer.

## 2019-01-18 NOTE — Telephone Encounter (Signed)
Spoke with patient. Patient states she feels about the same but she has not went to get her Prednisone and cough medication yet but she will get this today. Advised patient it was very important to start on the medication. NO new symptoms. Patient will let us know if she is not improving. She took it easy yesterday and today trying to rest.

## 2019-01-18 NOTE — Telephone Encounter (Signed)
Left message for patient to call back to follow up on patient.

## 2019-01-18 NOTE — Telephone Encounter (Signed)
Noted  

## 2019-02-26 ENCOUNTER — Telehealth: Payer: Self-pay

## 2019-02-26 NOTE — Telephone Encounter (Signed)
[facility] - Client Nonclinical Telephone Record AccessNurse Client [facility] - Client Client Site [place] - Night Physician Eliezer Lofts - MD Contact Type Call Who Is Calling Patient / Member / Family / Caregiver Caller Name Lacourtney Norrick Caller Phone Number [phone] Call Type Message Only Information Provided Reason for Call Returning a Call from the Office Initial [place] returning a call from [place] in the office. Additional Comment Office hours provided. Call Closed By: Marinus Maw Transaction Date/Time: 02/25/2019 5:30:04 PM (ET)

## 2019-02-27 ENCOUNTER — Ambulatory Visit (INDEPENDENT_AMBULATORY_CARE_PROVIDER_SITE_OTHER): Payer: Medicare Other

## 2019-02-27 ENCOUNTER — Telehealth: Payer: Self-pay | Admitting: Family Medicine

## 2019-02-27 ENCOUNTER — Ambulatory Visit: Payer: Medicare HMO

## 2019-02-27 DIAGNOSIS — E7800 Pure hypercholesterolemia, unspecified: Secondary | ICD-10-CM

## 2019-02-27 DIAGNOSIS — D72829 Elevated white blood cell count, unspecified: Secondary | ICD-10-CM

## 2019-02-27 DIAGNOSIS — R7303 Prediabetes: Secondary | ICD-10-CM

## 2019-02-27 DIAGNOSIS — Z0000 Encounter for general adult medical examination without abnormal findings: Secondary | ICD-10-CM | POA: Diagnosis not present

## 2019-02-27 DIAGNOSIS — M810 Age-related osteoporosis without current pathological fracture: Secondary | ICD-10-CM

## 2019-02-27 NOTE — Telephone Encounter (Signed)
-----   Message from Ellamae Sia sent at 02/21/2019  2:39 PM EDT ----- Regarding: Lab orders  for Thursday, 10.1.20 Patient is scheduled for CPX labs, please order future labs, Thanks , Karna Christmas

## 2019-02-27 NOTE — Progress Notes (Signed)
PCP notes: none  Health Maintenance: Patient will get flu vaccine at next office visit. Patient declined colonoscopy, Tdap and shingrix at this time.     Abnormal Screenings: none    Patient concerns: none    Nurse concerns: none    Next PCP appt.: 03/05/2019 @ 2:20 pm

## 2019-02-27 NOTE — Patient Instructions (Signed)
Katie Zimmerman , Thank you for taking time to come for your Medicare Wellness Visit. I appreciate your ongoing commitment to your health goals. Please review the following plan we discussed and let me know if I can assist you in the future.   Screening recommendations/referrals: Colonoscopy: declined Mammogram: up to date, completed 04/02/2018 Bone Density: up to date, completed 10/27/2016 Recommended yearly ophthalmology/optometry visit for glaucoma screening and checkup Recommended yearly dental visit for hygiene and checkup  Vaccinations: Influenza vaccine: will get at next office visit  Pneumococcal vaccine: series completed Tdap vaccine: declined Shingles vaccine: declined    Advanced directives: Please bring a copy of your POA (Power of Attorney) and/or Living Will to your next appointment.   Conditions/risks identified: hypercholesterolemia  Next appointment: 03/05/2019 @ 2:20 pm    Preventive Care 65 Years and Older, Female Preventive care refers to lifestyle choices and visits with your health care provider that can promote health and wellness. What does preventive care include?  A yearly physical exam. This is also called an annual well check.  Dental exams once or twice a year.  Routine eye exams. Ask your health care provider how often you should have your eyes checked.  Personal lifestyle choices, including:  Daily care of your teeth and gums.  Regular physical activity.  Eating a healthy diet.  Avoiding tobacco and drug use.  Limiting alcohol use.  Practicing safe sex.  Taking low-dose aspirin every day.  Taking vitamin and mineral supplements as recommended by your health care provider. What happens during an annual well check? The services and screenings done by your health care provider during your annual well check will depend on your age, overall health, lifestyle risk factors, and family history of disease. Counseling  Your health care provider may ask  you questions about your:  Alcohol use.  Tobacco use.  Drug use.  Emotional well-being.  Home and relationship well-being.  Sexual activity.  Eating habits.  History of falls.  Memory and ability to understand (cognition).  Work and work Statistician.  Reproductive health. Screening  You may have the following tests or measurements:  Height, weight, and BMI.  Blood pressure.  Lipid and cholesterol levels. These may be checked every 5 years, or more frequently if you are over 6 years old.  Skin check.  Lung cancer screening. You may have this screening every year starting at age 62 if you have a 30-pack-year history of smoking and currently smoke or have quit within the past 15 years.  Fecal occult blood test (FOBT) of the stool. You may have this test every year starting at age 57.  Flexible sigmoidoscopy or colonoscopy. You may have a sigmoidoscopy every 5 years or a colonoscopy every 10 years starting at age 67.  Hepatitis C blood test.  Hepatitis B blood test.  Sexually transmitted disease (STD) testing.  Diabetes screening. This is done by checking your blood sugar (glucose) after you have not eaten for a while (fasting). You may have this done every 1-3 years.  Bone density scan. This is done to screen for osteoporosis. You may have this done starting at age 30.  Mammogram. This may be done every 1-2 years. Talk to your health care provider about how often you should have regular mammograms. Talk with your health care provider about your test results, treatment options, and if necessary, the need for more tests. Vaccines  Your health care provider may recommend certain vaccines, such as:  Influenza vaccine. This is recommended every  year.  Tetanus, diphtheria, and acellular pertussis (Tdap, Td) vaccine. You may need a Td booster every 10 years.  Zoster vaccine. You may need this after age 37.  Pneumococcal 13-valent conjugate (PCV13) vaccine. One dose  is recommended after age 67.  Pneumococcal polysaccharide (PPSV23) vaccine. One dose is recommended after age 71. Talk to your health care provider about which screenings and vaccines you need and how often you need them. This information is not intended to replace advice given to you by your health care provider. Make sure you discuss any questions you have with your health care provider. Document Released: 06/12/2015 Document Revised: 02/03/2016 Document Reviewed: 03/17/2015 Elsevier Interactive Patient Education  2017 [employer] Prevention in the Home Falls can cause injuries. They can happen to people of all ages. There are many things you can do to make your home safe and to help prevent falls. What can I do on the outside of my home?  Regularly fix the edges of walkways and driveways and fix any cracks.  Remove anything that might make you trip as you walk through a door, such as a raised step or threshold.  Trim any bushes or trees on the path to your home.  Use bright outdoor lighting.  Clear any walking paths of anything that might make someone trip, such as rocks or tools.  Regularly check to see if handrails are loose or broken. Make sure that both sides of any steps have handrails.  Any raised decks and porches should have guardrails on the edges.  Have any leaves, snow, or ice cleared regularly.  Use sand or salt on walking paths during winter.  Clean up any spills in your garage right away. This includes oil or grease spills. What can I do in the bathroom?  Use night lights.  Install grab bars by the toilet and in the tub and shower. Do not use towel bars as grab bars.  Use non-skid mats or decals in the tub or shower.  If you need to sit down in the shower, use a plastic, non-slip stool.  Keep the floor dry. Clean up any water that spills on the floor as soon as it happens.  Remove soap buildup in the tub or shower regularly.  Attach bath mats  securely with double-sided non-slip rug tape.  Do not have throw rugs and other things on the floor that can make you trip. What can I do in the bedroom?  Use night lights.  Make sure that you have a light by your bed that is easy to reach.  Do not use any sheets or blankets that are too big for your bed. They should not hang down onto the floor.  Have a firm chair that has side arms. You can use this for support while you get dressed.  Do not have throw rugs and other things on the floor that can make you trip. What can I do in the kitchen?  Clean up any spills right away.  Avoid walking on wet floors.  Keep items that you use a lot in easy-to-reach places.  If you need to reach something above you, use a strong step stool that has a grab bar.  Keep electrical cords out of the way.  Do not use floor polish or wax that makes floors slippery. If you must use wax, use non-skid floor wax.  Do not have throw rugs and other things on the floor that can make you trip. What can  I do with my stairs?  Do not leave any items on the stairs.  Make sure that there are handrails on both sides of the stairs and use them. Fix handrails that are broken or loose. Make sure that handrails are as long as the stairways.  Check any carpeting to make sure that it is firmly attached to the stairs. Fix any carpet that is loose or worn.  Avoid having throw rugs at the top or bottom of the stairs. If you do have throw rugs, attach them to the floor with carpet tape.  Make sure that you have a light switch at the top of the stairs and the bottom of the stairs. If you do not have them, ask someone to add them for you. What else can I do to help prevent falls?  Wear shoes that:  Do not have high heels.  Have rubber bottoms.  Are comfortable and fit you well.  Are closed at the toe. Do not wear sandals.  If you use a stepladder:  Make sure that it is fully opened. Do not climb a closed  stepladder.  Make sure that both sides of the stepladder are locked into place.  Ask someone to hold it for you, if possible.  Clearly mark and make sure that you can see:  Any grab bars or handrails.  First and last steps.  Where the edge of each step is.  Use tools that help you move around (mobility aids) if they are needed. These include:  Canes.  Walkers.  Scooters.  Crutches.  Turn on the lights when you go into a dark area. Replace any light bulbs as soon as they burn out.  Set up your furniture so you have a clear path. Avoid moving your furniture around.  If any of your floors are uneven, fix them.  If there are any pets around you, be aware of where they are.  Review your medicines with your doctor. Some medicines can make you feel dizzy. This can increase your chance of falling. Ask your doctor what other things that you can do to help prevent falls. This information is not intended to replace advice given to you by your health care provider. Make sure you discuss any questions you have with your health care provider. Document Released: 03/12/2009 Document Revised: 10/22/2015 Document Reviewed: 06/20/2014 Elsevier Interactive Patient Education  2017 [employer].

## 2019-02-27 NOTE — Progress Notes (Signed)
Subjective:   Katie Zimmerman is a 73 y.o. female who presents for Medicare Annual (Subsequent) preventive examination.  Review of Systems:    This visit is being conducted through telemedicine via telephone at the nurse health advisor's home address due to the COVID-19 pandemic. This patient has given me verbal consent via doximity to conduct this visit, patient states they are participating from their home address. Some vital signs may be absent or patient reported.    Patient identification: identified by name, DOB, and current address  Cardiac Risk Factors include: advanced age (>45men, >42 women);dyslipidemia     Objective:     Vitals: There were no vitals taken for this visit.  There is no height or weight on file to calculate BMI.  Advanced Directives 02/27/2019 01/16/2019 12/19/2018 10/16/2018 02/20/2018 03/02/2017 02/07/2017  Does Patient Have a Medical Advance Directive? Yes No No No No No No  Type of Paramedic of [place];Living will - - - - - -  Copy of [place] in Chart? No - copy requested - - - - - -  Would patient like information on creating a medical advance directive? - - No - Patient declined - Yes (MAU/Ambulatory/Procedural Areas - Information given) No - Patient declined -    Tobacco Social History   Tobacco Use  Smoking Status Never Smoker  Smokeless Tobacco Never Used     Counseling given: Not Answered   Clinical Intake:  Pre-visit preparation completed: Yes  Pain : 0-10 Pain Score: 2  Pain Type: Chronic pain Pain Location: Face Pain Orientation: Right Pain Descriptors / Indicators: Burning, Numbness Pain Onset: More than a month ago Pain Frequency: Intermittent     Nutritional Risks: None Diabetes: No  How often do you need to have someone help you when you read instructions, pamphlets, or other written materials from your doctor or pharmacy?: 1 - Never What is the last grade level you completed in  school?: 12th  Interpreter Needed?: No  Information entered by :: CJohnson, LPN  Past Medical History:  Diagnosis Date  . Breast cancer ([place]) 12/2011  . Breast cancer ([place]) 12/2011   DCIS  . Complication of anesthesia    hard to wake up age 43-not since  . COPD (chronic obstructive pulmonary disease) ([place])   . Fibromyalgia   . GERD (gastroesophageal reflux disease)   . History of radiation therapy 02/29/12 -04/21/12   right breast, 5040 cGy in 28 fx, boost to total 6240 cGy  . Osteoporosis   . Personal history of colonic polyps-adenomas 05/25/2012  . Personal history of radiation therapy   . PHN (postherpetic neuralgia) 2000   Right side of face  . Pneumonia 12/19/2018   Past Surgical History:  Procedure Laterality Date  . APPENDECTOMY    . BIOPSY BREAST  01/10/12   Right breast needle cor biopsy  . BREAST BIOPSY  01/10/2012  . BREAST CYST ASPIRATION  01/10/2012  . BREAST LUMPECTOMY Right 01/26/2012   Right/ERPR+ DCIS, right ax snbx  . FOOT SURGERY  1996   rt/lt great toes  . GLAUCOMA SURGERY  1993   laser  . OVARIAN CYST SURGERY  1964  . TUBAL LIGATION  1976   Family History  Problem Relation Age of Onset  . Hypertension Mother   . Cancer Mother        LIVER cancer  . Hypertension Father   . Diabetes Father   . Heart failure Father   . Breast cancer  Sister 54       NOT hormone receptor breast cancer  . Breast cancer Maternal Aunt 75  . Breast cancer Other        dx in her 83s  . Breast cancer Cousin 66       maternal cousin  . Breast cancer Cousin        maternal cousin  . Breast cancer Cousin        maternal cousin  . Cancer Cousin        unknown form of cancer; maternal cousin  . Cancer Cousin        unknown form of cancer; maternal cousin  . Cancer Cousin 11       ? lung cancer; maternal cousin  . Breast cancer Cousin        dx 36s-50s; paternal cousin  . Breast cancer Cousin        dx in her 64s; paternal cousin  . Colon cancer Neg Hx   . Stomach  cancer Neg Hx    Social History   Socioeconomic History  . Marital status: Married    Spouse name: Not on file  . Number of children: 2  . Years of education: Not on file  . Highest education level: Not on file  Occupational History  . Occupation: CONSULTANT    Employer: [employer]: Retired  Scientific laboratory technician  . Financial resource strain: Not hard at all  . Food insecurity    Worry: Never true    Inability: Never true  . Transportation needs    Medical: No    Non-medical: No  Tobacco Use  . Smoking status: Never Smoker  . Smokeless tobacco: Never Used  Substance and Sexual Activity  . Alcohol use: Yes    Comment: occasional glass of wine  . Drug use: No  . Sexual activity: Not Currently  Lifestyle  . Physical activity    Days per week: 0 days    Minutes per session: 0 min  . Stress: Not at all  Relationships  . Social Herbalist on phone: Not on file    Gets together: Not on file    Attends religious service: Not on file    Active member of club or organization: Not on file    Attends meetings of clubs or organizations: Not on file    Relationship status: Not on file  Other Topics Concern  . 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 acetaminophen (TYLENOL) 500 MG tablet Take 1,000 mg by mouth every 4 (four) hours as needed for mild pain, moderate pain or headache.   . albuterol (PROVENTIL HFA;VENTOLIN HFA) 108 (90 Base) MCG/ACT inhaler Inhale 2 puffs into the lungs every 6 (six) hours as needed for wheezing or shortness of breath.  . cholecalciferol (VITAMIN D) 1000 units tablet Take 4,000 Units by mouth daily.  .  Fluticasone-Umeclidin-Vilant (TRELEGY ELLIPTA) 100-62.5-25 MCG/INH AEPB Inhale 1 puff into the lungs daily. Rinse mouth.  . gabapentin (NEURONTIN) 600 MG tablet Take 2 tablets three times a day (Patient taking differently:  Take 1,200 mg by mouth 3 (three) times daily. )  . nortriptyline (PAMELOR) 50 MG capsule Take 3 capsules every night  . traMADol (ULTRAM) 50 MG tablet Take 1/2 to 1 tablet every night as needed for severe pain (Patient taking differently: Take 50-100 mg by mouth daily as needed for moderate pain. Takes at night)  . benzonatate (TESSALON) 100 MG capsule Take 1 capsule (100 mg total) by mouth 2 (two) times daily as needed for cough. (Patient not taking: Reported on 02/27/2019)  . predniSONE (DELTASONE) 20 MG tablet 3 tabs po day one, then 2 tabs daily x 4 days (Patient not taking: Reported on 02/27/2019)   No facility-administered encounter medications on file as of 02/27/2019.     Activities of Daily Living In your present state of health, do you have any difficulty performing the following activities: 02/27/2019 12/19/2018  Hearing? N N  Vision? N N  Difficulty concentrating or making decisions? N N  Walking or climbing stairs? N N  Dressing or bathing? N N  Doing errands, shopping? N N  Preparing Food and eating ? N -  Using the Toilet? N -  In the past six months, have you accidently leaked urine? N -  Do you have problems with loss of bowel control? N -  Managing your Medications? N -  Managing your Finances? N -  Housekeeping or managing your Housekeeping? N -  Some recent data might be hidden    Patient Care Team: [name], MD as PCP - General Delice Lesch Lezlie Octave, MD as Consulting Physician (Neurology)    Assessment:   This is a routine wellness examination for Giana.  Exercise Activities and Dietary recommendations Current Exercise Habits: Home exercise routine, Type of exercise: walking, Time (Minutes): 20, Frequency (Times/Week): 7, Weekly Exercise  (Minutes/Week): 140, Intensity: Mild, Exercise limited by: None identified  Goals    . Patient Stated     Starting 02/20/2018, I will continue to take medications as prescribed.     . Patient Stated     02/27/2019, Patient states she wants to continue walking daily.        Fall Risk Fall Risk  02/27/2019 01/14/2019 10/16/2018 07/17/2018 02/20/2018  Falls in the past year? 0 0 0 0 No  Number falls in past yr: - 0 0 0 -  Injury with Fall? - 0 0 0 -  Risk for fall due to : Medication side effect - - - -  Follow up Falls evaluation completed;Falls prevention discussed Falls evaluation completed - Falls evaluation completed -   Is the patient's home free of loose throw rugs in walkways, pet beds, electrical cords, etc?   yes      Grab bars in the bathroom? yes      Handrails on the stairs?   yes      Adequate lighting?   yes  Timed Get Up and Go performed: n/a  Depression Screen PHQ 2/9 Scores 02/27/2019 02/20/2018 02/07/2017 02/04/2016  PHQ - 2 Score 0 0 0 0  PHQ- 9 Score 0 0 0 -     Cognitive Function MMSE - Mini Mental State Exam 02/27/2019 02/20/2018 02/07/2017  Orientation to time 5 5 5   Orientation to Place 5 5 5   Registration 3 3 3   Attention/ Calculation 5 0 0  Recall 3 3 3   Language- name 2 objects - 0 0  Language- repeat 1 1 1   Language- follow 3 step command - 3 3  Language- read &  follow direction - 0 0  Write a sentence - 0 0  Copy design - 0 0  Total score - 20 20  Mini Cog  Mini-Cog screen was completed. Maximum score is 22. A value of 0 denotes this part of the MMSE was not completed or the patient failed this part of the Mini-Cog screening.      Immunization History  Administered Date(s) Administered  . Influenza Split 05/29/2012  . Influenza Whole 03/07/2005  . Influenza, High Dose Seasonal PF 06/21/2018  . Influenza,inj,Quad PF,6+ Mos 01/20/2014, 01/22/2015, 02/04/2016, 02/07/2017  . Pneumococcal Conjugate-13 01/20/2014  . Pneumococcal Polysaccharide-23  10/21/2011  . Td 07/02/1998    Qualifies for Shingles Vaccine? yes  Screening Tests Health Maintenance  Topic Date Due  . INFLUENZA VACCINE  12/29/2018  . COLONOSCOPY  02/27/2020 (Originally 05/18/2015)  . TETANUS/TDAP  02/27/2020 (Originally 07/02/2008)  . MAMMOGRAM  04/02/2020  . DEXA SCAN  Completed  . Hepatitis C Screening  Completed  . PNA vac Low Risk Adult  Completed    Cancer Screenings: Lung: Low Dose CT Chest recommended if Age 65-80 years, 30 pack-year currently smoking OR have quit w/in 15years. Patient does not qualify. Breast:  Up to date on Mammogram? Yes, completed 04/02/2018   Up to date of Bone Density/Dexa? Yes, completed 10/27/2016 Colorectal: declined  Additional Screenings:  Hepatitis C Screening: 01/19/2016     Plan:   Patient states that she wants to continue walking daily.    I have personally reviewed and noted the following in the patient's chart:   . Medical and social history . Use of alcohol, tobacco or illicit drugs  . Current medications and supplements . Functional ability and status . Nutritional status . Physical activity . Advanced directives . List of other physicians . Hospitalizations, surgeries, and ER visits in previous 12 months . Vitals . Screenings to include cognitive, depression, and falls . Referrals and appointments  In addition, I have reviewed and discussed with patient certain preventive protocols, quality metrics, and best practice recommendations. A written personalized care plan for preventive services as well as general preventive health recommendations were provided to patient.     Andrez Grime, LPN  [id number]

## 2019-02-28 ENCOUNTER — Other Ambulatory Visit (INDEPENDENT_AMBULATORY_CARE_PROVIDER_SITE_OTHER): Payer: Medicare Other

## 2019-02-28 DIAGNOSIS — R030 Elevated blood-pressure reading, without diagnosis of hypertension: Secondary | ICD-10-CM | POA: Diagnosis not present

## 2019-02-28 DIAGNOSIS — R7303 Prediabetes: Secondary | ICD-10-CM | POA: Diagnosis not present

## 2019-02-28 DIAGNOSIS — Z6835 Body mass index (BMI) 35.0-35.9, adult: Secondary | ICD-10-CM | POA: Diagnosis not present

## 2019-02-28 DIAGNOSIS — D72829 Elevated white blood cell count, unspecified: Secondary | ICD-10-CM

## 2019-02-28 DIAGNOSIS — G500 Trigeminal neuralgia: Secondary | ICD-10-CM | POA: Diagnosis not present

## 2019-02-28 DIAGNOSIS — E7800 Pure hypercholesterolemia, unspecified: Secondary | ICD-10-CM | POA: Diagnosis not present

## 2019-02-28 LAB — HEMOGLOBIN A1C: Hgb A1c MFr Bld: 6.9 % — ABNORMAL HIGH (ref 4.6–6.5)

## 2019-02-28 LAB — CBC WITH DIFFERENTIAL/PLATELET
Basophils Absolute: 0.1 10*3/uL (ref 0.0–0.1)
Basophils Relative: 1 % (ref 0.0–3.0)
Eosinophils Absolute: 0.3 10*3/uL (ref 0.0–0.7)
Eosinophils Relative: 4.1 % (ref 0.0–5.0)
HCT: 40.3 % (ref 36.0–46.0)
Hemoglobin: 13.1 g/dL (ref 12.0–15.0)
Lymphocytes Relative: 31 % (ref 12.0–46.0)
Lymphs Abs: 2.5 10*3/uL (ref 0.7–4.0)
MCHC: 32.7 g/dL (ref 30.0–36.0)
MCV: 89.6 fl (ref 78.0–100.0)
Monocytes Absolute: 0.5 10*3/uL (ref 0.1–1.0)
Monocytes Relative: 6.5 % (ref 3.0–12.0)
Neutro Abs: 4.6 10*3/uL (ref 1.4–7.7)
Neutrophils Relative %: 57.4 % (ref 43.0–77.0)
Platelets: 372 10*3/uL (ref 150.0–400.0)
RBC: 4.49 Mil/uL (ref 3.87–5.11)
RDW: 15.1 % (ref 11.5–15.5)
WBC: 8.1 10*3/uL (ref 4.0–10.5)

## 2019-02-28 LAB — COMPREHENSIVE METABOLIC PANEL
ALT: 19 U/L (ref 0–35)
AST: 18 U/L (ref 0–37)
Albumin: 4.3 g/dL (ref 3.5–5.2)
Alkaline Phosphatase: 97 U/L (ref 39–117)
BUN: 12 mg/dL (ref 6–23)
CO2: 22 mEq/L (ref 19–32)
Calcium: 10.1 mg/dL (ref 8.4–10.5)
Chloride: 105 mEq/L (ref 96–112)
Creatinine, Ser: 0.9 mg/dL (ref 0.40–1.20)
GFR: 61.35 mL/min (ref >60.00)
Glucose, Bld: 124 mg/dL — ABNORMAL HIGH (ref 70–99)
Potassium: 4.5 mEq/L (ref 3.5–5.1)
Sodium: 139 mEq/L (ref 135–145)
Total Bilirubin: 0.3 mg/dL (ref 0.2–1.2)
Total Protein: 6.9 g/dL (ref 6.0–8.3)

## 2019-02-28 LAB — LIPID PANEL
Cholesterol: 181 mg/dL (ref 0–200)
HDL: 52.9 mg/dL (ref >39.00)
LDL Cholesterol: 91 mg/dL (ref 0–99)
NonHDL: 128.24
Total CHOL/HDL Ratio: 3
Triglycerides: 188 mg/dL — ABNORMAL HIGH (ref 0.0–149.0)
VLDL: 37.6 mg/dL (ref 0.0–40.0)

## 2019-02-28 NOTE — Progress Notes (Signed)
No critical labs need to be addressed urgently. We will discuss labs in detail at upcoming office visit.   

## 2019-03-05 ENCOUNTER — Encounter: Payer: Self-pay | Admitting: Family Medicine

## 2019-03-05 ENCOUNTER — Encounter: Payer: Medicare Other | Admitting: Family Medicine

## 2019-03-05 ENCOUNTER — Other Ambulatory Visit: Payer: Self-pay

## 2019-03-05 ENCOUNTER — Ambulatory Visit (INDEPENDENT_AMBULATORY_CARE_PROVIDER_SITE_OTHER): Payer: Medicare Other | Admitting: Family Medicine

## 2019-03-05 VITALS — BP 140/90 | HR 97 | Temp 98.1°F | Ht <= 58 in | Wt 174.0 lb

## 2019-03-05 DIAGNOSIS — J441 Chronic obstructive pulmonary disease with (acute) exacerbation: Secondary | ICD-10-CM

## 2019-03-05 DIAGNOSIS — Z853 Personal history of malignant neoplasm of breast: Secondary | ICD-10-CM

## 2019-03-05 DIAGNOSIS — B0229 Other postherpetic nervous system involvement: Secondary | ICD-10-CM

## 2019-03-05 DIAGNOSIS — E119 Type 2 diabetes mellitus without complications: Secondary | ICD-10-CM | POA: Diagnosis not present

## 2019-03-05 DIAGNOSIS — M8580 Other specified disorders of bone density and structure, unspecified site: Secondary | ICD-10-CM

## 2019-03-05 DIAGNOSIS — R030 Elevated blood-pressure reading, without diagnosis of hypertension: Secondary | ICD-10-CM

## 2019-03-05 DIAGNOSIS — Z23 Encounter for immunization: Secondary | ICD-10-CM

## 2019-03-05 DIAGNOSIS — Z0000 Encounter for general adult medical examination without abnormal findings: Secondary | ICD-10-CM | POA: Diagnosis not present

## 2019-03-05 DIAGNOSIS — E7800 Pure hypercholesterolemia, unspecified: Secondary | ICD-10-CM

## 2019-03-05 LAB — HM DIABETES FOOT EXAM

## 2019-03-05 NOTE — Progress Notes (Signed)
Chief Complaint  Patient presents with  . Annual Exam    Part 2    History of Present Illness: HPI  The patient presents for  complete physical and review of chronic health problems. He/She also has the following acute concerns today: none  The patient saw a LPN or RN for medicare wellness visit.  Prevention and wellness was reviewed in detail. Note reviewed and important notes copied below. Health Maintenance: Patient will get flu vaccine at next office visit. Patient declined colonoscopy, Tdap and shingrix at this time.   03/05/19  Hospitalized 12/19/18 for COPD exacerbation.  She is taking Trelegy.  Daily cough, long term.  She is back to baseline. Using albuterol prn.. occ.  No fever.  Elevated Cholesterol:  Lab Results  Component Value Date   CHOL 181 02/28/2019   HDL 52.90 02/28/2019   LDLCALC 91 02/28/2019   LDLDIRECT 102.0 02/20/2018   TRIG 188.0 (H) 02/28/2019   CHOLHDL 3 02/28/2019  Using medications without problems:none Muscle aches: none Diet compliance: poor  Exercise: minimal Other complaints:  HX OF BREAST CANCER Hx of DCIS of the right breast status post lumpectomy.  Completed tamoxifen x 5 years Dr. Sondra Come radiation onc.  Dr. Wynonia Hazard oncologist. Released.. Missed last mammogram in 09/2016. Dr. Emmie Niemann in surgeon.    Post herpetic neuralgia: moderate control  On gabapentin and nortriptiline. Followed by neurologist.  Prediabetes  Now in diabetes range.... was given prednisone in hospital Lab Results  Component Value Date   HGBA1C 6.9 (H) 02/28/2019    COVID 19 screen No recent travel or known exposure to [place] The patient denies respiratory symptoms of COVID 19 at this time.  The importance of social distancing was discussed today.   Review of Systems  Constitutional: Negative for chills and fever.  HENT: Negative for congestion and ear pain.   Eyes: Negative for pain and redness.  Respiratory: Negative for cough and shortness  of breath.   Cardiovascular: Negative for chest pain, palpitations and leg swelling.  Gastrointestinal: Negative for abdominal pain, blood in stool, constipation, diarrhea, nausea and vomiting.  Genitourinary: Negative for dysuria.  Musculoskeletal: Negative for falls and myalgias.  Skin: Negative for rash.  Neurological: Negative for dizziness.  Psychiatric/Behavioral: Negative for depression. The patient is not nervous/anxious.       Past Medical History:  Diagnosis Date  . Breast cancer ([place]) 12/2011  . Breast cancer ([place]) 12/2011   DCIS  . Complication of anesthesia    hard to wake up age 85-not since  . COPD (chronic obstructive pulmonary disease) ([place])   . Fibromyalgia   . GERD (gastroesophageal reflux disease)   . History of radiation therapy 02/29/12 -04/21/12   right breast, 5040 cGy in 28 fx, boost to total 6240 cGy  . Osteoporosis   . Personal history of colonic polyps-adenomas 05/25/2012  . Personal history of radiation therapy   . PHN (postherpetic neuralgia) 2000   Right side of face  . Pneumonia 12/19/2018    reports that she has never smoked. She has never used smokeless tobacco. She reports current alcohol use. She reports that she does not use drugs.   Current Outpatient Medications:  .  acetaminophen (TYLENOL) 500 MG tablet, Take 1,000 mg by mouth every 4 (four) hours as needed for mild pain, moderate pain or headache. , Disp: , Rfl:  .  albuterol (PROVENTIL HFA;VENTOLIN HFA) 108 (90 Base) MCG/ACT inhaler, Inhale 2 puffs into the lungs every 6 (six) hours as needed for wheezing or  shortness of breath., Disp: 1 Inhaler, Rfl: 12 .  cholecalciferol (VITAMIN D) 1000 units tablet, Take 4,000 Units by mouth daily., Disp: , Rfl:  .  Fluticasone-Umeclidin-Vilant (TRELEGY ELLIPTA) 100-62.5-25 MCG/INH AEPB, Inhale 1 puff into the lungs daily. Rinse mouth., Disp: 1 each, Rfl: 12 .  gabapentin (NEURONTIN) 600 MG tablet, Take 1,200 mg by mouth 3 (three) times daily., Disp: ,  Rfl:  .  nortriptyline (PAMELOR) 50 MG capsule, Take 3 capsules every night, Disp: 90 capsule, Rfl: 11 .  traMADol (ULTRAM) 50 MG tablet, Take 1/2 to 1 tablet every night as needed for severe pain, Disp: 30 tablet, Rfl: 5 .  benzonatate (TESSALON) 100 MG capsule, Take 1 capsule (100 mg total) by mouth 2 (two) times daily as needed for cough. (Patient not taking: Reported on 02/27/2019), Disp: 20 capsule, Rfl: 0   Observations/Objective: Blood pressure 140/90, pulse 97, temperature 98.1 F (36.7 C), temperature source Temporal, height 4' 9.5" (1.461 m), weight 174 lb (78.9 kg), SpO2 97 %.  Physical Exam Constitutional:      General: She is not in acute distress.    Appearance: Normal appearance. She is well-developed. She is obese. She is not ill-appearing or toxic-appearing.  HENT:     Head: Normocephalic.     Right Ear: Hearing, tympanic membrane, ear canal and external ear normal.     Left Ear: Hearing, tympanic membrane, ear canal and external ear normal.     Nose: Nose normal.  Eyes:     General: Lids are normal. Lids are everted, no foreign bodies appreciated.     Conjunctiva/sclera: Conjunctivae normal.     Pupils: Pupils are equal, round, and reactive to light.  Neck:     Musculoskeletal: Normal range of motion and neck supple.     Thyroid: No thyroid mass or thyromegaly.     Vascular: No carotid bruit.     Trachea: Trachea normal.  Cardiovascular:     Rate and Rhythm: Normal rate and regular rhythm.     Heart sounds: Normal heart sounds, S1 normal and S2 normal. No murmur. No gallop.   Pulmonary:     Effort: Pulmonary effort is normal. No respiratory distress.     Breath sounds: Normal breath sounds. No wheezing, rhonchi or rales.  Abdominal:     General: Bowel sounds are normal. There is no distension or abdominal bruit.     Palpations: Abdomen is soft. There is no fluid wave or mass.     Tenderness: There is no abdominal tenderness. There is no guarding or rebound.      Hernia: No hernia is present.  Lymphadenopathy:     Cervical: No cervical adenopathy.  Skin:    General: Skin is warm and dry.     Findings: No rash.  Neurological:     Mental Status: She is alert.     Cranial Nerves: No cranial nerve deficit.     Sensory: No sensory deficit.  Psychiatric:        Mood and Affect: Mood is not anxious or depressed.        Speech: Speech normal.        Behavior: Behavior normal. Behavior is cooperative.        Judgment: Judgment normal.     Diabetic foot exam: Normal inspection No skin breakdown No calluses  Normal DP pulses Normal sensation to light touch and monofilament Nails normal   Assessment and Plan The patient's preventative maintenance and recommended screening tests for an annual wellness  exam were reviewed in full today. Brought up to date unless services declined.  Counselled on the importance of diet, exercise, and its role in overall health and mortality. The patient's FH and SH was reviewed, including their home life, tobacco status, and drug and alcohol status.     Vaccines: Uptodate with pneumovax and prevnar,consider TDAP, shingles. Given flu shot today. 04/2012 Dr. Carlean Purl, colonoscopy 5 adenomas max 6 mm Repeat colon 04/2017 .. due DVE/PAP: not indicated Mammo: Breast cancer s/p  Lumpectomy on right, radiation, followed by Dr. Lindi Adie, last mammo 03/2018 DEXA:09/2016; osteopenia, on tamoxifen(off NOV 2018), fosamax not needed. Repeat in 2 years.  Non smoker, sig second hand some. Hearing loss in right ear. Dr. Warren Danes. Hep C: neg COPD with acute exacerbation ([place]) Now resolved, Back at baseline on Trelegy and albuterol prn.  High cholesterol  At goal < 100.  Diabetes mellitus without complication ([place])  Diet controlled.  Post herpetic neuralgia  moderate control  On gabapentin and nortriptiline. Followed by neurologist.     Eliezer Lofts, MD

## 2019-03-05 NOTE — Patient Instructions (Addendum)
Get back on track with healthy low carb diet and regular exercise.  Follow BP at home.. call if greater than 140/90.  Call to set up colonoscopy with Dr. Carlean Purl.   We will set up mammogram and bone density.

## 2019-03-08 ENCOUNTER — Encounter: Payer: Self-pay | Admitting: Family Medicine

## 2019-04-01 ENCOUNTER — Telehealth: Payer: Self-pay | Admitting: Neurology

## 2019-04-01 NOTE — Telephone Encounter (Signed)
Patient called to report she is having pain in her mouth that is not going away. Patient stated it is nerve related and her medications are not helping it.  [employer] on [address]

## 2019-04-01 NOTE — Telephone Encounter (Signed)
Tried calling pt. No answer and vmail box full.

## 2019-04-06 DIAGNOSIS — R030 Elevated blood-pressure reading, without diagnosis of hypertension: Secondary | ICD-10-CM | POA: Insufficient documentation

## 2019-04-06 HISTORY — DX: Elevated blood-pressure reading, without diagnosis of hypertension: R03.0

## 2019-04-06 NOTE — Assessment & Plan Note (Signed)
Now resolved, Back at baseline on Trelegy and albuterol prn.

## 2019-04-06 NOTE — Assessment & Plan Note (Signed)
Follow BP at at home.

## 2019-04-06 NOTE — Assessment & Plan Note (Signed)
Diet controlled.  

## 2019-04-06 NOTE — Assessment & Plan Note (Signed)
At goal < 100.

## 2019-04-06 NOTE — Assessment & Plan Note (Signed)
moderate control  On gabapentin and nortriptiline. Followed by neurologist.

## 2019-04-10 ENCOUNTER — Ambulatory Visit (INDEPENDENT_AMBULATORY_CARE_PROVIDER_SITE_OTHER): Payer: Medicare Other | Admitting: Family Medicine

## 2019-04-10 ENCOUNTER — Encounter: Payer: Self-pay | Admitting: Family Medicine

## 2019-04-10 ENCOUNTER — Telehealth: Payer: Self-pay | Admitting: Family Medicine

## 2019-04-10 VITALS — BP 162/102 | HR 91 | Temp 97.3°F | Ht <= 58 in

## 2019-04-10 DIAGNOSIS — R112 Nausea with vomiting, unspecified: Secondary | ICD-10-CM

## 2019-04-10 MED ORDER — ONDANSETRON 8 MG PO TBDP: 8.0000 mg | ORAL_TABLET | Freq: Three times a day (TID) | ORAL | 1 refills | Status: DC | PRN

## 2019-04-10 MED ORDER — PROMETHAZINE HCL 25 MG PO TABS: 12.5000 mg | ORAL_TABLET | Freq: Four times a day (QID) | ORAL | 0 refills | Status: DC | PRN

## 2019-04-10 NOTE — Telephone Encounter (Signed)
Patient called and stated that she called [facility] to see if her prescription for Zofran was ready. She stated that the pharmacy advised her that they are waiting for an approval from our office before they can fill it.   Please advise

## 2019-04-10 NOTE — Telephone Encounter (Signed)
PA completed on CoverMyMeds.  Sent for review.  Awaiting decision.

## 2019-04-10 NOTE — Telephone Encounter (Signed)
PA for Zofran denied.  Will forward to Dr. Lorelei Pont to send in different medication.

## 2019-04-10 NOTE — Telephone Encounter (Signed)
Phenergan 25 mg, 1/2 to 1 tablet po q 6 hours prn nausea, #30, 0 ref

## 2019-04-10 NOTE — Progress Notes (Signed)
Katie Zimmerman T. Shawnna Pancake, MD Primary [facility] and [place] at [facility] [place], [address] Phone: [phone]  FAX: [phone]  Katie Zimmerman - 73 y.o. female  MRN [id number]  Date of Birth: 06/30/1945  Visit Date: 04/10/2019  PCP: [name], MD  Referred by: [name], MD Chief Complaint  Patient presents with  . Nausea    started Monday Morning  . Emesis   Virtual Visit via Video Note:  I connected with  Katie Zimmerman on 04/10/2019 10:20 AM EST by a video enabled telemedicine application and verified that I am speaking with the correct person using two identifiers.   Location patient: home computer, tablet, or smartphone Location provider: work or home office Consent: Verbal consent directly obtained from Katie Zimmerman. Persons participating in the virtual visit: patient, provider  I discussed the limitations of evaluation and management by telemedicine and the availability of in person appointments. The patient expressed understanding and agreed to proceed.  History of Present Illness:  She is a pleasant lady and she presents with some nausea and vomiting that is been going on since Monday.  She does not have diarrhea.  She denies fever.  She has a little bit of a headache when she moves around.  No shortness of breath or coughing.  No wheezing.  No neurological changes and no rash  Review of Systems as above: See pertinent positives and pertinent negatives per HPI No acute distress verbally  Past Medical History, Surgical History, Social History, Family History, Problem List, Medications, and Allergies have been reviewed and updated if relevant.   Observations/Objective/Exam:  An attempt was made to discern vital signs over the phone and per patient if applicable and possible.   General:    Alert, Oriented, appears well and in no acute distress HEENT:     Atraumatic, conjunctiva clear, no obvious  abnormalities on inspection of external nose and ears.  Neck:    Normal movements of the head and neck Pulmonary:     On inspection no signs of respiratory distress, breathing rate appears normal, no obvious gross SOB, gasping or wheezing Cardiovascular:    No obvious cyanosis Musculoskeletal:    Moves all visible extremities without noticeable abnormality Psych / Neurological:     Pleasant and cooperative, no obvious depression or anxiety, speech and thought processing grossly intact  Assessment and Plan:    ICD-10-CM   1. Non-intractable vomiting with nausea, unspecified vomiting type  R11.2    Classic nausea and vomiting.  Push fluids.  Advance diet as tolerated.  P.o. Zofran.  I discussed the assessment and treatment plan with the patient. The patient was provided an opportunity to ask questions and all were answered. The patient agreed with the plan and demonstrated an understanding of the instructions.   The patient was advised to call back or seek an in-person evaluation if the symptoms worsen or if the condition fails to improve as anticipated.  Follow-up: prn unless noted otherwise below No follow-ups on file.  Meds ordered this encounter  Medications  . ondansetron (ZOFRAN ODT) 8 MG disintegrating tablet    Sig: Take 1 tablet (8 mg total) by mouth every 8 (eight) hours as needed for nausea or vomiting.    Dispense:  20 tablet    Refill:  1   No orders of the defined types were placed in this encounter.   Signed,  Maud Deed. Jaysun Wessels, MD

## 2019-04-10 NOTE — Telephone Encounter (Signed)
Phenergan 25 mg prescription sent to [facility] as instructed by Dr. Lorelei Pont.

## 2019-04-11 NOTE — Telephone Encounter (Signed)
[employer] called today stating that the Patient's Phenergan was denied due to diagnosis.    Phone- [phone] Option 5.

## 2019-04-11 NOTE — Telephone Encounter (Signed)
Spoke with Katie Zimmerman to see if she was able to get any medication yesterday for the nausea and vomiting.  She states she did get the Zofran after the pharmacy used an Rx card and then she paid $30 out of pocket for it.  She did state that the medication has helped a lot.  I called [employer].  They advised me that the Zofran PA was denied because the diagnosis had nausea and vomiting unspecified and PAs are usually denied for unspecified diagnoses. They said we could file an appeal but since patient has already picked up and paid for medication I did not feel an appeal was necessary at this time.

## 2019-04-15 ENCOUNTER — Telehealth: Payer: Self-pay | Admitting: Family Medicine

## 2019-04-15 NOTE — Telephone Encounter (Signed)
Sending to clinical staff for review: Okay to sign/close encounter or is further follow up needed? ° °

## 2019-04-15 NOTE — Telephone Encounter (Signed)
Patient is requesting a call back She stated she had a virtual visit 11/11 for nausea and vomiting Patient stated that her symptoms are not improving, She requested to speak with you

## 2019-04-15 NOTE — Telephone Encounter (Signed)
Spoke with Katie Zimmerman.  She did a virtual visit with Dr. Lorelei Pont on 04/10/2019 and still is not feeling well.  She states she tried to eat something on Saturday and it made her stomach hurt but she feels hungry.  She also complains that she feels like her head is pulsating.  I scheduled her a virutal visit with Dr. Diona Browner on 04/16/2019 at 12:00 pm.  I did advise that she needs to make sure she is pushing fluids to avoid dehydration.  She should continue using her Zofran prn. ER precautions given.  Patient states understanding.

## 2019-04-16 ENCOUNTER — Ambulatory Visit (INDEPENDENT_AMBULATORY_CARE_PROVIDER_SITE_OTHER): Payer: Medicare Other | Admitting: Family Medicine

## 2019-04-16 ENCOUNTER — Encounter: Payer: Self-pay | Admitting: Family Medicine

## 2019-04-16 VITALS — BP 78/48 | HR 70 | Ht <= 58 in

## 2019-04-16 DIAGNOSIS — E860 Dehydration: Secondary | ICD-10-CM

## 2019-04-16 DIAGNOSIS — R1013 Epigastric pain: Secondary | ICD-10-CM | POA: Diagnosis not present

## 2019-04-16 DIAGNOSIS — I959 Hypotension, unspecified: Secondary | ICD-10-CM | POA: Diagnosis not present

## 2019-04-16 DIAGNOSIS — R112 Nausea with vomiting, unspecified: Secondary | ICD-10-CM | POA: Diagnosis not present

## 2019-04-16 NOTE — Telephone Encounter (Signed)
Patient contacted the office and states that she called EMS and had them evaluate her. Patient states her BP was 120/88 and her blood sugar was 149. Patient states that EMS advised her that they did not think she needed to be transported to the hospital at this time, but if she had any changes, they would be more than happy to come back out. She wanted to inform [place] of this.

## 2019-04-16 NOTE — Telephone Encounter (Signed)
Noted  

## 2019-04-16 NOTE — Telephone Encounter (Signed)
Patient called back, stated she needed to give the vitals for when the EMS was called.  I see the patient has already spoke with Jerene Pitch today and those have been sent back

## 2019-04-16 NOTE — Progress Notes (Signed)
VIRTUAL VISIT Due to national recommendations of social distancing due to [place] 19, a virtual visit is felt to be most appropriate for this patient at this time.   I connected with the patient on 04/16/19 at 12:00 PM EST by virtual telehealth platform and verified that I am speaking with the correct person using two identifiers.   Interactive audio and video telecommunications were attempted between this provider and patient, however failed, due to patient having technical difficulties OR patient did not have access to video capability.  We continued and completed visit with audio only.   I discussed the limitations, risks, security and privacy concerns of performing an evaluation and management service by  virtual telehealth platform and the availability of in person appointments. I also discussed with the patient that there may be a patient responsible charge related to this service. The patient expressed understanding and agreed to proceed.  Patient location: Home Provider Location: [facility] Participants: Eliezer Lofts and [name]   Chief Complaint  Patient presents with  . Follow-up    N&V-Did virtual with Dr. Lorelei Pont last week-Not any better    History of Present Illness:  73 year old female with DM presents for follow up   N/V since 04/08/2019. Saw Dr. Lorelei Pont virtually on 04/10/2019. N/V started after eating pizza on 11/8..made epigastric area hurt.. prior to N/V She is feeling lethargic  2 cups a day of liquid intake. Minimal food.   No fever.  She is nauseous... zofran helps her not throw up.  Last emesis 5 days ago.  Mild diarrhea. No blood stool, no dark stool. She is having abd pain. In upper abdomen. Dizzy off and on.  Last UOP 7 AM this AM.  Took tylenol, gabapentin earlier.  No sick contacts. Husband did have one episode of diarrhea yesterday.  Cannot check blood sugar .Marland Kitchen out of batteries.  Husband cannot drive, no family able to take her to  office/urgent care/ER.    102/71 BP on repeat today.  BP Readings from Last 3 Encounters:  04/16/19 (!) 78/48  04/10/19 (!) 162/102  03/05/19 140/90    COVID 19 screen No recent travel or known exposure to COVID19 The patient denies respiratory symptoms of COVID 19 at this time.  The importance of social distancing was discussed today.   Review of Systems  Constitutional: Positive for malaise/fatigue. Negative for chills and fever.  HENT: Negative for congestion and ear pain.   Eyes: Negative for pain and redness.  Respiratory: Negative for cough and shortness of breath.   Cardiovascular: Negative for chest pain, palpitations and leg swelling.  Gastrointestinal: Negative for abdominal pain, blood in stool, constipation, diarrhea, nausea and vomiting.  Genitourinary: Negative for dysuria.  Musculoskeletal: Negative for falls and myalgias.  Skin: Negative for rash.  Neurological: Positive for dizziness and weakness.  Psychiatric/Behavioral: Negative for depression. The patient is not nervous/anxious.       Past Medical History:  Diagnosis Date  . Breast cancer ([place]) 12/2011  . Breast cancer ([place]) 12/2011   DCIS  . Complication of anesthesia    hard to wake up age 55-not since  . COPD (chronic obstructive pulmonary disease) ([place])   . Fibromyalgia   . GERD (gastroesophageal reflux disease)   . History of radiation therapy 02/29/12 -04/21/12   right breast, 5040 cGy in 28 fx, boost to total 6240 cGy  . Osteoporosis   . Personal history of colonic polyps-adenomas 05/25/2012  . Personal history of radiation therapy   .  PHN (postherpetic neuralgia) 2000   Right side of face  . Pneumonia 12/19/2018    reports that she has never smoked. She has never used smokeless tobacco. She reports current alcohol use. She reports that she does not use drugs.   Current Outpatient Medications:  .  acetaminophen (TYLENOL) 500 MG tablet, Take 1,000 mg by mouth every 4 (four) hours as needed  for mild pain, moderate pain or headache. , Disp: , Rfl:  .  albuterol (PROVENTIL HFA;VENTOLIN HFA) 108 (90 Base) MCG/ACT inhaler, Inhale 2 puffs into the lungs every 6 (six) hours as needed for wheezing or shortness of breath., Disp: 1 Inhaler, Rfl: 12 .  benzonatate (TESSALON) 100 MG capsule, Take 1 capsule (100 mg total) by mouth 2 (two) times daily as needed for cough., Disp: 20 capsule, Rfl: 0 .  cholecalciferol (VITAMIN D) 1000 units tablet, Take 4,000 Units by mouth daily., Disp: , Rfl:  .  Fluticasone-Umeclidin-Vilant (TRELEGY ELLIPTA) 100-62.5-25 MCG/INH AEPB, Inhale 1 puff into the lungs daily. Rinse mouth., Disp: 1 each, Rfl: 12 .  gabapentin (NEURONTIN) 600 MG tablet, Take 1,200 mg by mouth 3 (three) times daily., Disp: , Rfl:  .  nortriptyline (PAMELOR) 50 MG capsule, Take 3 capsules every night, Disp: 90 capsule, Rfl: 11 .  ondansetron (ZOFRAN ODT) 8 MG disintegrating tablet, Take 1 tablet (8 mg total) by mouth every 8 (eight) hours as needed for nausea or vomiting., Disp: 20 tablet, Rfl: 1 .  traMADol (ULTRAM) 50 MG tablet, Take 1/2 to 1 tablet every night as needed for severe pain, Disp: 30 tablet, Rfl: 5   Observations/Objective: Blood pressure (!) 78/48, pulse 70, height 4' 9.5" (1.461 m).  Physical Exam  Physical Exam Constitutional:      General: The patient is not in acute distress. Voice slurred, lethargic sounding Pulmonary:     Effort: Pulmonary effort is normal. No respiratory distress.  Neurological:     Mental Status: The patient is alert and oriented to person, place, and time.  Psychiatric:        Mood and Affect: Mood normal.        Behavior: Behavior normal.   Assessment and Plan 73 year old diabetic with no way to check CBGs, no transportation, no one who can get her meds presents with likely dehydration for 9 days of emesis, decreased po/fluid intake and possible hypotension. Unclear cause of emesis.Marland Kitchen viral GE vs toxin vs possible gastritis/ PUD.    Discussed options with pt.  Decided calling EMS for eval of BP, blood sugar and possible ER visit for IVF indicated.   I discussed the assessment and treatment plan with the patient. The patient was provided an opportunity to ask questions and all were answered. The patient agreed with the plan and demonstrated an understanding of the instructions.   The patient was advised to call back or seek an in-person evaluation if the symptoms worsen or if the condition fails to improve as anticipated.     Eliezer Lofts, MD

## 2019-04-17 ENCOUNTER — Telehealth: Payer: Self-pay | Admitting: Family Medicine

## 2019-04-17 NOTE — Telephone Encounter (Signed)
Patient's sister in law Katie Zimmerman called and stated that the patient's husband just past away the afternoon and they really think the patient needs some type of medication to calm her nerves  Katie Zimmerman that You were out of office today but I would send a message back for when you return.    If something can be sent they would like [facility] sent to [facility] here in [place].

## 2019-04-18 MED ORDER — ALPRAZOLAM 0.25 MG PO TABS: 0.2500 mg | ORAL_TABLET | Freq: Two times a day (BID) | ORAL | 0 refills | Status: DC | PRN

## 2019-04-18 NOTE — Telephone Encounter (Signed)
[facility] in [place], as instructed by Dr. Diona Browner.  Ms. Mulnix notified as instructed by telephone and states understanding.  I ask about the N&V and she states her niece cooked her breakfast today and she still felt a little queasy so she took a Zofran and that really helps.

## 2019-04-18 NOTE — Telephone Encounter (Signed)
Call in Rx for alprazolam 0.25 mg 1 tabs BID prn anxiety. #30, 0 RF. INstruct her to limit use as she is on multipel sedating meds. Do not use same time as  the tramadol, gabapentin  Please also find out how her N/V is. Looks like EMS came out and blood sugar and BP was in nml range.

## 2019-05-03 ENCOUNTER — Ambulatory Visit
Admission: RE | Admit: 2019-05-03 | Discharge: 2019-05-03 | Disposition: A | Discharge status: Home / Self Care | Payer: Medicare Other | Source: Ambulatory Visit | Attending: Family Medicine | Admitting: Family Medicine

## 2019-05-03 ENCOUNTER — Other Ambulatory Visit: Payer: Self-pay

## 2019-05-03 DIAGNOSIS — Z853 Personal history of malignant neoplasm of breast: Secondary | ICD-10-CM

## 2019-05-03 DIAGNOSIS — Z1231 Encounter for screening mammogram for malignant neoplasm of breast: Secondary | ICD-10-CM | POA: Diagnosis not present

## 2019-06-10 ENCOUNTER — Ambulatory Visit
Admission: RE | Admit: 2019-06-10 | Discharge: 2019-06-10 | Disposition: A | Discharge status: Home / Self Care | Payer: Medicare Other | Source: Ambulatory Visit | Attending: Family Medicine | Admitting: Family Medicine

## 2019-06-10 ENCOUNTER — Other Ambulatory Visit: Payer: Self-pay

## 2019-06-10 DIAGNOSIS — M810 Age-related osteoporosis without current pathological fracture: Secondary | ICD-10-CM | POA: Diagnosis not present

## 2019-06-10 DIAGNOSIS — M85852 Other specified disorders of bone density and structure, left thigh: Secondary | ICD-10-CM | POA: Diagnosis not present

## 2019-06-10 DIAGNOSIS — M8580 Other specified disorders of bone density and structure, unspecified site: Secondary | ICD-10-CM

## 2019-06-27 ENCOUNTER — Ambulatory Visit: Payer: Medicare Other

## 2019-06-29 ENCOUNTER — Encounter: Payer: Self-pay | Admitting: Family Medicine

## 2019-07-05 ENCOUNTER — Ambulatory Visit: Payer: Medicare Other | Attending: Internal Medicine

## 2019-07-05 DIAGNOSIS — Z23 Encounter for immunization: Secondary | ICD-10-CM | POA: Insufficient documentation

## 2019-07-05 NOTE — Progress Notes (Signed)
   Covid-19 Vaccination Clinic  Name:  Katie Zimmerman    MRN: [id number] DOB: September 21, 1945  07/05/2019  [name] was observed post Covid-19 immunization for 15 minutes without incidence. She was provided with Vaccine Information Sheet and instruction to access the V-Safe system.   [name] was instructed to call 911 with any severe reactions post vaccine: Marland Kitchen Difficulty breathing  . Swelling of your face and throat  . A fast heartbeat  . A bad rash all over your body  . Dizziness and weakness    Immunizations Administered    Name Date Dose VIS Date Route   Pfizer COVID-19 Vaccine 07/05/2019  4:45 PM 0.3 mL 05/10/2019 Intramuscular   Manufacturer: [place]   Lot: [id number]   [place]: [id number]

## 2019-07-14 ENCOUNTER — Ambulatory Visit: Payer: Medicare Other

## 2019-07-30 ENCOUNTER — Ambulatory Visit: Payer: Medicare Other | Attending: Internal Medicine

## 2019-07-30 DIAGNOSIS — Z23 Encounter for immunization: Secondary | ICD-10-CM | POA: Insufficient documentation

## 2019-07-30 NOTE — Progress Notes (Signed)
   Covid-19 Vaccination Clinic  Name:  Katie Zimmerman    MRN: [id number] DOB: [birth year]  07/30/2019  Katie Zimmerman was observed post Covid-19 immunization for 30 minutes based on pre-vaccination screening without incident. She was provided with Vaccine Information Sheet and instruction to access the V-Safe system.   Katie Zimmerman was instructed to call 911 with any severe reactions post vaccine: Marland Kitchen Difficulty breathing  . Swelling of face and throat  . A fast heartbeat  . A bad rash all over body  . Dizziness and weakness   Immunizations Administered    Name Date Dose VIS Date Route   Pfizer COVID-19 Vaccine 07/30/2019  4:05 PM 0.3 mL 05/10/2019 Intramuscular   Manufacturer: [place]   Lot: [id number]   [place]: [id number]

## 2019-08-13 ENCOUNTER — Encounter: Payer: Self-pay | Admitting: Neurology

## 2019-08-13 ENCOUNTER — Other Ambulatory Visit: Payer: Self-pay

## 2019-08-13 ENCOUNTER — Ambulatory Visit: Payer: Medicare Other | Admitting: Neurology

## 2019-08-13 VITALS — BP 164/103 | HR 82 | Ht <= 58 in | Wt 170.4 lb

## 2019-08-13 DIAGNOSIS — B0229 Other postherpetic nervous system involvement: Secondary | ICD-10-CM | POA: Diagnosis not present

## 2019-08-13 MED ORDER — NORTRIPTYLINE HCL 50 MG PO CAPS: ORAL_CAPSULE | ORAL | 3 refills | Status: DC

## 2019-08-13 MED ORDER — GABAPENTIN 600 MG PO TABS: ORAL_TABLET | ORAL | 3 refills | Status: DC

## 2019-08-13 MED ORDER — TRAMADOL HCL 50 MG PO TABS: ORAL_TABLET | ORAL | 5 refills | Status: DC

## 2019-08-13 NOTE — Progress Notes (Signed)
NEUROLOGY FOLLOW UP OFFICE NOTE  Katie Zimmerman [id number] 03/07/1946  HISTORY OF PRESENT ILLNESS: I had the pleasure of seeing Katie Zimmerman in follow-up in the neurology clinic on 08/13/2019.  The patient was last seen 7 months ago for postherpetic neuralgia. Since her last visit, she has been evaluated by Neurosurgery for possible surgical options of continued facial pain. Her MRI face/trigeminal with and without contrast done 01/2018 showed poorly demonstrated cisternal segment of trigeminal nerve, differential diagnosis includes anomalous course, edema, developmental variant, or old injury, no abnormal enhancement. She has been hesitant about the options and has decided to hold off for now. She is on high doses of gabapentin 1200mg  TID and nortriptyline 150mg  qhs and continues to have breakthrough pain several times a month. She would take prn Tramadol 50mg , usually 1/2 tab qhs, but 2-3 times she has needed a full tablet. Last week she could hardly talk due to right facial pain. The right side of her mouth stays numb, and pain runs right in the cheek. She has been taking Tylenol as well for back pain, which helps during the daytime. After she takes medications, pain calms down for 2-3 days. She was eating breakfast this morning and felt it "wanting to be ugly" but did not progress. Sometimes if she presses on the area, it may trigger pain. She feels the right side of her face looks different.     History on Initial Assessment 01/15/2018: This is a pleasant 74 year old right-handed woman with a history of breast cancer, presenting for postherpetic neuralgia. She had shingles affecting the right side of her face in 2000. She did well for a year or 2 with no symptoms then started having significant pain in the same distribution. She describes pain as pressure and tenderness when she washes or touches her cheek on the right side of her nose. It is painful to talk, with pain radiating from the right temporal  region down the right side of her tongue. It is hard to eat and talk. Bending down seems to worsen pain. She does have a diagnosis of TMJ on the right as well. She was tried on different medications in the past few years and found gabapentin helped for a while, then stopped working. She tried Lyrica several years ago which also helped for a little while, then she saw a neurologist who increased to the dose to 200mg  but she never filled the higher dose because she developed kidney stones in 2011. Around that time, the pain was not bothering her so much, but when pain recurred, she was restarted on gabapentin which worked pretty good until recently. For the past few months, she has had a constant 10/10 pain (appears comfortable in the office today), when she has sharp pains, the pain would be more than a 10. Pain is worse at night, really bad around 9pm. She takes her gabapentin and can sleep. The last few days she has increased gabapentin 300mg  taking 3 in AM, 2 at supper, 3 at bedtime. No drowsiness. She denies any tinnitus but feels she has lost some hearing in the right ear. No vision changes. For a time she was having headaches with pressure over the vertex, but none lately. No nausea/vomiting, dizziness, diplopia, dysarthria/dysphagia, neck pain, focal numbness/tingling/weakness in the extremities, bowel/bladder dysfunction. Left side of face is unaffected. She has chronic back pain.     PAST MEDICAL HISTORY: Past Medical History:  Diagnosis Date  . Breast cancer ([place]) 12/2011  .  Breast cancer ([place]) 12/2011   DCIS  . Complication of anesthesia    hard to wake up age 58-not since  . COPD (chronic obstructive pulmonary disease) ([place])   . Fibromyalgia   . GERD (gastroesophageal reflux disease)   . History of radiation therapy 02/29/12 -04/21/12   right breast, 5040 cGy in 28 fx, boost to total 6240 cGy  . Osteoporosis   . Personal history of colonic polyps-adenomas 05/25/2012  . Personal history of  radiation therapy   . PHN (postherpetic neuralgia) 2000   Right side of face  . Pneumonia 12/19/2018    MEDICATIONS: Current Outpatient Medications on File Prior to Visit  Medication Sig Dispense Refill  . acetaminophen (TYLENOL) 500 MG tablet Take 1,000 mg by mouth every 4 (four) hours as needed for mild pain, moderate pain or headache.     . albuterol (PROVENTIL HFA;VENTOLIN HFA) 108 (90 Base) MCG/ACT inhaler Inhale 2 puffs into the lungs every 6 (six) hours as needed for wheezing or shortness of breath. 1 Inhaler 12  . ALPRAZolam (XANAX) 0.25 MG tablet Take 1 tablet (0.25 mg total) by mouth 2 (two) times daily as needed for anxiety. 30 tablet 0  . benzonatate (TESSALON) 100 MG capsule Take 1 capsule (100 mg total) by mouth 2 (two) times daily as needed for cough. 20 capsule 0  . cholecalciferol (VITAMIN D) 1000 units tablet Take 4,000 Units by mouth daily.    . Fluticasone-Umeclidin-Vilant (TRELEGY ELLIPTA) 100-62.5-25 MCG/INH AEPB Inhale 1 puff into the lungs daily. Rinse mouth. 1 each 12  . ondansetron (ZOFRAN ODT) 8 MG disintegrating tablet Take 1 tablet (8 mg total) by mouth every 8 (eight) hours as needed for nausea or vomiting. 20 tablet 1   No current facility-administered medications on file prior to visit.    ALLERGIES: Allergies  Allergen Reactions  . Penicillins Anaphylaxis, Rash and Other (See Comments)    Has patient had a PCN reaction causing immediate rash, facial/tongue/throat swelling, SOB or lightheadedness with hypotension: Yes Has patient had a PCN reaction causing severe rash involving mucus membranes or skin necrosis: No Has patient had a PCN reaction that required hospitalization No Has patient had a PCN reaction occurring within the last 10 years: No If all of the above answers are "NO", then may proceed with Cephalosporin use.  . Venlafaxine Nausea Only  . Sulfonamide Derivatives Rash    FAMILY HISTORY: Family History  Problem Relation Age of Onset  .  Hypertension Mother   . Cancer Mother        LIVER cancer  . Hypertension Father   . Diabetes Father   . Heart failure Father   . Breast cancer Sister 23       NOT hormone receptor breast cancer  . Breast cancer Maternal Aunt 75  . Breast cancer Other        dx in her 27s  . Breast cancer Cousin 18       maternal cousin  . Breast cancer Cousin        maternal cousin  . Breast cancer Cousin        maternal cousin  . Cancer Cousin        unknown form of cancer; maternal cousin  . Cancer Cousin        unknown form of cancer; maternal cousin  . Cancer Cousin 11       ? lung cancer; maternal cousin  . Breast cancer Cousin        dx  59s-50s; paternal cousin  . Breast cancer Cousin        dx in her 21s; paternal cousin  . Colon cancer Neg Hx   . Stomach cancer Neg Hx     SOCIAL HISTORY: Social History   Socioeconomic History  . Marital status: Married    Spouse name: Not on file  . Number of children: 2  . Years of education: Not on file  . Highest education level: Not on file  Occupational History  . Occupation: CONSULTANT    Employer: [employer]    Comment: Retired  Tobacco Use  . Smoking status: Never Smoker  . Smokeless tobacco: Never Used  Substance and Sexual Activity  . Alcohol use: Yes    Comment: occasional glass of wine  . Drug use: No  . Sexual activity: Not Currently  Other Topics Concern  . 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 Difficulty of Paying Living Expenses: Not hard at all  Food Insecurity: No Food Insecurity  . Worried About Charity fundraiser in the Last Year: Never true  . Ran Out of Food in the Last Year: Never true   Transportation Needs: No Transportation Needs  . Lack of Transportation (Medical): No  . Lack of Transportation (Non-Medical): No  Physical Activity: Inactive  . Days of Exercise per Week: 0 days  . Minutes of Exercise per Session: 0 min  Stress: No Stress Concern Present  . Feeling of Stress : Not at all  Social Connections:   . Frequency of Communication with Friends and Family:   . Frequency of Social Gatherings with Friends and Family:   . Attends Religious Services:   . Active Member of Clubs or Organizations:   . Attends Archivist Meetings:   Marland Kitchen Marital Status:   Intimate Partner Violence: Not At Risk  . Fear of Current or Ex-Partner: No  . Emotionally Abused: No  . Physically Abused: No  . Sexually Abused: No    REVIEW OF SYSTEMS: Constitutional: No fevers, chills, or sweats, no generalized fatigue, change in appetite Eyes: No visual changes, double vision, eye pain Ear, nose and throat: No hearing loss, ear pain, nasal congestion, sore throat Cardiovascular: No chest pain, palpitations Respiratory:  No shortness of breath at rest or with exertion, wheezes GastrointestinaI: No nausea, vomiting, diarrhea, abdominal pain, fecal incontinence Genitourinary:  No dysuria, urinary retention or frequency Musculoskeletal:  No neck pain, back pain Integumentary: No rash, pruritus, skin lesions Neurological: as above Psychiatric: No depression, insomnia, anxiety Endocrine: No palpitations, fatigue, diaphoresis, mood swings, change in appetite, change in weight, increased thirst Hematologic/Lymphatic:  No anemia, purpura, petechiae. Allergic/Immunologic: no itchy/runny eyes, nasal congestion, recent allergic reactions, rashes  PHYSICAL EXAM: Vitals:   08/13/19 1522 08/13/19 1636  BP: (!) 171/107 (!) 164/103  Pulse: 82   SpO2: 98%    General: No acute distress Head:  Normocephalic/atraumatic Skin/Extremities: No rash, no edema Neurological Exam: alert and  oriented to person, place, and time. No aphasia or dysarthria. Fund of knowledge is appropriate.  Recent and remote memory are intact.  Attention and concentration are normal.  Cranial nerves: Pupils equal, round, reactive to light.  Extraocular movements intact with no nystagmus. Visual fields full. Facial sensation intact. There is slight indentation on the right cheek area. Motor: Bulk and tone normal, muscle strength 5/5 throughout with no pronator drift. Finger to nose testing intact.  Gait narrow-based and steady.   IMPRESSION: This is a pleasant 74 yo RH woman with a history of breast cancer with right-sided post-herpetic neuralgia that continues to have exacerbation on high doses of gabapentin 1200mg  TID and nortriptyline 150mg  qhs. We discussed that we could add on Duloxetine, however since she is overall responding to prn Tramadol, agreed to continue course for now. She will talk to her daughter about surgical options presented by Neurosurgery. Follow-up in 6 months, she knows to call for any changes.    Thank you for allowing me to participate in her care.  Please do not hesitate to call for any questions or concerns.   Ellouise Newer, M.D.   CC: Dr. Diona Browner

## 2019-08-13 NOTE — Patient Instructions (Signed)
Great seeing you! Continue all your medications. If pain becomes more frequent, we can try adding on Cymbalta. Continue to think about surgical options as well. Follow-up in 6 months, call for any changes.

## 2019-08-26 ENCOUNTER — Telehealth: Payer: Self-pay | Admitting: Family Medicine

## 2019-08-26 DIAGNOSIS — E119 Type 2 diabetes mellitus without complications: Secondary | ICD-10-CM

## 2019-08-26 DIAGNOSIS — E7800 Pure hypercholesterolemia, unspecified: Secondary | ICD-10-CM

## 2019-08-26 NOTE — Telephone Encounter (Signed)
-----   Message from Cloyd Stagers, RT sent at 08/15/2019  1:28 PM EDT ----- Regarding: Lab Orders for Tuesday 3.30.2021 Please place lab orders for Tuesday 3.30.2021, office visit for 6 month f/u on Tuesday 4.6.2021 Thank you, Dyke Maes RT(R)

## 2019-08-27 ENCOUNTER — Other Ambulatory Visit: Payer: Medicare Other

## 2019-08-27 ENCOUNTER — Other Ambulatory Visit: Payer: Self-pay

## 2019-09-02 LAB — HM DIABETES FOOT EXAM

## 2019-09-03 ENCOUNTER — Encounter: Payer: Self-pay | Admitting: Family Medicine

## 2019-09-03 ENCOUNTER — Ambulatory Visit (INDEPENDENT_AMBULATORY_CARE_PROVIDER_SITE_OTHER): Payer: Medicare Other | Admitting: Family Medicine

## 2019-09-03 ENCOUNTER — Other Ambulatory Visit: Payer: Self-pay

## 2019-09-03 DIAGNOSIS — R7303 Prediabetes: Secondary | ICD-10-CM | POA: Diagnosis not present

## 2019-09-03 DIAGNOSIS — Z6836 Body mass index (BMI) 36.0-36.9, adult: Secondary | ICD-10-CM | POA: Diagnosis not present

## 2019-09-03 DIAGNOSIS — F4321 Adjustment disorder with depressed mood: Secondary | ICD-10-CM

## 2019-09-03 LAB — POCT GLYCOSYLATED HEMOGLOBIN (HGB A1C): Hemoglobin A1C: 6 % — AB (ref 4.0–5.6)

## 2019-09-03 LAB — MICROALBUMIN / CREATININE URINE RATIO
Creatinine,U: 120.2 mg/dL
Microalb Creat Ratio: 8.1 mg/g (ref 0.0–30.0)
Microalb, Ur: 9.7 mg/dL — ABNORMAL HIGH (ref 0.0–1.9)

## 2019-09-03 NOTE — Assessment & Plan Note (Addendum)
Encouraged exercise, weight loss, healthy eating habits. If not continuing to improve.. can consider SGLT2i at next OV.

## 2019-09-03 NOTE — Progress Notes (Signed)
Chief Complaint  Patient presents with  . Diabetes    Patient states she doesn't have DM    History of Present Illness: HPI  74 year old female presents with borderline diabetes.  1 year  ago.. A1C 6.5 6 months ago A1C 6.9.Marland Kitchen this was around time her husband passed away.  Diabetes:   Improving but she states she has not been working on diet. Lab Results  Component Value Date   HGBA1C 6.0 (A) 09/03/2019  Using medications without difficulties: Hypoglycemic episodes: Hyperglycemic episodes: Feet problems: no ulcers Blood Sugars averaging: not checking eye exam within last year: 09/2018 Pending microalbumin.  Wt Readings from Last 3 Encounters:  09/03/19 171 lb 4 oz (77.7 kg)  08/13/19 170 lb 6.4 oz (77.3 kg)  03/05/19 174 lb (78.9 kg)   She is grieving from loss of her husband but is coping fairly well. Denies insomnia, depresssion or anxiety.   This visit occurred during the SARS-CoV-2 public health emergency.  Safety protocols were in place, including screening questions prior to the visit, additional usage of staff PPE, and extensive cleaning of exam room while observing appropriate contact time as indicated for disinfecting solutions.   COVID 19 screen:  No recent travel or known exposure to COVID19 The patient denies respiratory symptoms of COVID 19 at this time. The importance of social distancing was discussed today.     Review of Systems  Constitutional: Negative for chills and fever.  HENT: Negative for congestion and ear pain.        Occ blood in mucus when blowing nose.  Eyes: Negative for pain and redness.  Respiratory: Negative for cough and shortness of breath.   Cardiovascular: Negative for chest pain, palpitations and leg swelling.  Gastrointestinal: Negative for abdominal pain, blood in stool, constipation, diarrhea, nausea and vomiting.  Genitourinary: Negative for dysuria.  Musculoskeletal: Negative for falls and myalgias.  Skin: Negative for rash.   Neurological: Negative for dizziness.  Psychiatric/Behavioral: Negative for depression. The patient is not nervous/anxious.       Past Medical History:  Diagnosis Date  . Breast cancer ([place]) 12/2011  . Breast cancer ([place]) 12/2011   DCIS  . Complication of anesthesia    hard to wake up age 37-not since  . COPD (chronic obstructive pulmonary disease) ([place])   . Fibromyalgia   . GERD (gastroesophageal reflux disease)   . History of radiation therapy 02/29/12 -04/21/12   right breast, 5040 cGy in 28 fx, boost to total 6240 cGy  . Osteoporosis   . Personal history of colonic polyps-adenomas 05/25/2012  . Personal history of radiation therapy   . PHN (postherpetic neuralgia) 2000   Right side of face  . Pneumonia 12/19/2018    reports that she has never smoked. She has never used smokeless tobacco. She reports current alcohol use. She reports that she does not use drugs.   Current Outpatient Medications:  .  acetaminophen (TYLENOL) 500 MG tablet, Take 1,000 mg by mouth every 4 (four) hours as needed for mild pain, moderate pain or headache. , Disp: , Rfl:  .  albuterol (PROVENTIL HFA;VENTOLIN HFA) 108 (90 Base) MCG/ACT inhaler, Inhale 2 puffs into the lungs every 6 (six) hours as needed for wheezing or shortness of breath., Disp: 1 Inhaler, Rfl: 12 .  cholecalciferol (VITAMIN D) 1000 units tablet, Take 4,000 Units by mouth daily., Disp: , Rfl:  .  Fluticasone-Umeclidin-Vilant (TRELEGY ELLIPTA) 100-62.5-25 MCG/INH AEPB, Inhale 1 puff into the lungs daily. Rinse mouth., Disp: 1  each, Rfl: 12 .  gabapentin (NEURONTIN) 600 MG tablet, Take 2 tablets three times a day, Disp: 540 tablet, Rfl: 3 .  nortriptyline (PAMELOR) 50 MG capsule, Take 3 capsules every night, Disp: 270 capsule, Rfl: 3 .  ondansetron (ZOFRAN ODT) 8 MG disintegrating tablet, Take 1 tablet (8 mg total) by mouth every 8 (eight) hours as needed for nausea or vomiting., Disp: 20 tablet, Rfl: 1 .  traMADol (ULTRAM) 50 MG tablet, Take  1/2 to 1 tablet every night as needed for severe pain, Disp: 30 tablet, Rfl: 5   Observations/Objective: Pulse (!) 103, temperature 98.1 F (36.7 C), temperature source Temporal, height 4' 9.5" (1.461 m), weight 171 lb 4 oz (77.7 kg), SpO2 94 %.  Physical Exam Constitutional:      General: She is not in acute distress.    Appearance: Normal appearance. She is well-developed. She is obese. She is not ill-appearing or toxic-appearing.  HENT:     Head: Normocephalic.     Right Ear: Hearing, tympanic membrane, ear canal and external ear normal. Tympanic membrane is not erythematous, retracted or bulging.     Left Ear: Hearing, tympanic membrane, ear canal and external ear normal. Tympanic membrane is not erythematous, retracted or bulging.     Nose: No mucosal edema or rhinorrhea.     Right Sinus: No maxillary sinus tenderness or frontal sinus tenderness.     Left Sinus: No maxillary sinus tenderness or frontal sinus tenderness.     Mouth/Throat:     Pharynx: Uvula midline.  Eyes:     General: Lids are normal. Lids are everted, no foreign bodies appreciated.     Conjunctiva/sclera: Conjunctivae normal.     Pupils: Pupils are equal, round, and reactive to light.  Neck:     Thyroid: No thyroid mass or thyromegaly.     Vascular: No carotid bruit.     Trachea: Trachea normal.  Cardiovascular:     Rate and Rhythm: Normal rate and regular rhythm.     Pulses: Normal pulses.     Heart sounds: Normal heart sounds, S1 normal and S2 normal. No murmur. No friction rub. No gallop.   Pulmonary:     Effort: Pulmonary effort is normal. No tachypnea or respiratory distress.     Breath sounds: Normal breath sounds. No decreased breath sounds, wheezing, rhonchi or rales.  Abdominal:     General: Bowel sounds are normal.     Palpations: Abdomen is soft.     Tenderness: There is no abdominal tenderness.  Musculoskeletal:     Cervical back: Normal range of motion and neck supple.  Skin:    General:  Skin is warm and dry.     Findings: No rash.  Neurological:     Mental Status: She is alert.  Psychiatric:        Mood and Affect: Mood is not anxious or depressed.        Speech: Speech normal.        Behavior: Behavior normal. Behavior is cooperative.        Thought Content: Thought content normal.        Judgment: Judgment normal.    Diabetic foot exam: Normal inspection No skin breakdown No calluses  Normal DP pulses Normal sensation to light touch and monofilament Nails normal   Assessment and Plan  Prediabetes Now A1C back in prediabetes range pt request change of diagnosis from  Borderline diabetes. INfo on diabetes and prediabetes given.. she will work on healthy eating  and regular exercise.  BMI not at goal.. will work on weight loss.   Class 2 severe obesity due to excess calories with serious comorbidity and body mass index (BMI) of 36.0 to 36.9 in adult [facility]) Encouraged exercise, weight loss, healthy eating habits. If not continuing to improve.. can consider SGLT2i at next OV.  Grieving She is working through grief in normal way. Discussed with her in detail today.     Eliezer Lofts, MD

## 2019-09-03 NOTE — Assessment & Plan Note (Signed)
She is working through grief in normal way. Discussed with her in detail today.

## 2019-09-03 NOTE — Assessment & Plan Note (Addendum)
Now A1C back in prediabetes range pt request change of diagnosis from  Borderline diabetes. INfo on diabetes and prediabetes given.. she will work on healthy eating and regular exercise.  BMI not at goal.. will work on weight loss.

## 2019-09-03 NOTE — Patient Instructions (Signed)
Work on low [employer], regular exercise and weight loss.

## 2019-10-25 ENCOUNTER — Telehealth: Payer: Self-pay | Admitting: Family Medicine

## 2019-10-25 NOTE — Progress Notes (Signed)
  Chronic Care Management   Note  10/25/2019 Name: Katie Zimmerman MRN: [id number] DOB: [birth year]  Katie Zimmerman is a 74 y.o. year old female who is a primary care patient of Bedsole, Amy E, MD. I reached out to [name] by phone today in response to a referral sent by Katie Zimmerman's PCP, [name], MD.   Katie Zimmerman was given information about Chronic Care Management services today including:  1. CCM service includes personalized support from designated clinical staff supervised by her physician, including individualized plan of care and coordination with other care providers 2. 24/7 contact phone numbers for assistance for urgent and routine care needs. 3. Service will only be billed when office clinical staff spend 20 minutes or more in a month to coordinate care. 4. Only one practitioner may furnish and bill the service in a calendar month. 5. The patient may stop CCM services at any time (effective at the end of the month) by phone call to the office staff.   Patient agreed to services and verbal consent obtained.   This note is not being shared with the patient for the following reason: To respect privacy (The patient or proxy has requested that the information not be shared).  Follow up plan:   Earney Hamburg Upstream Scheduler

## 2019-11-07 ENCOUNTER — Telehealth: Payer: Self-pay | Admitting: Neurology

## 2019-11-07 NOTE — Telephone Encounter (Signed)
Pt called no answer voice mail left for her to call the office back

## 2019-11-07 NOTE — Telephone Encounter (Signed)
Yes, please have her discuss anxiety with PCP, thanks

## 2019-11-07 NOTE — Telephone Encounter (Signed)
Pt advised to follow up with her PCP about her anxiety. Pt verbalized understanding

## 2019-11-07 NOTE — Telephone Encounter (Signed)
AccessNurse message:  "Caller states she wanted to speak to office regarding anxiety."

## 2019-11-12 ENCOUNTER — Encounter: Payer: Self-pay | Admitting: Family Medicine

## 2019-11-12 ENCOUNTER — Other Ambulatory Visit: Payer: Self-pay

## 2019-11-12 ENCOUNTER — Ambulatory Visit (INDEPENDENT_AMBULATORY_CARE_PROVIDER_SITE_OTHER): Payer: Medicare Other | Admitting: Family Medicine

## 2019-11-12 VITALS — BP 153/92 | HR 79 | Temp 96.8°F | Ht <= 58 in | Wt 174.5 lb

## 2019-11-12 DIAGNOSIS — D692 Other nonthrombocytopenic purpura: Secondary | ICD-10-CM | POA: Diagnosis not present

## 2019-11-12 DIAGNOSIS — F418 Other specified anxiety disorders: Secondary | ICD-10-CM | POA: Diagnosis not present

## 2019-11-12 HISTORY — DX: Other nonthrombocytopenic purpura: D69.2

## 2019-11-12 LAB — CBC WITH DIFFERENTIAL/PLATELET
Basophils Absolute: 0.1 10*3/uL (ref 0.0–0.1)
Basophils Relative: 1 % (ref 0.0–3.0)
Eosinophils Absolute: 0.3 10*3/uL (ref 0.0–0.7)
Eosinophils Relative: 4.8 % (ref 0.0–5.0)
HCT: 37 % (ref 36.0–46.0)
Hemoglobin: 12.3 g/dL (ref 12.0–15.0)
Lymphocytes Relative: 37 % (ref 12.0–46.0)
Lymphs Abs: 2.4 10*3/uL (ref 0.7–4.0)
MCHC: 33.1 g/dL (ref 30.0–36.0)
MCV: 90 fl (ref 78.0–100.0)
Monocytes Absolute: 0.5 10*3/uL (ref 0.1–1.0)
Monocytes Relative: 7.7 % (ref 3.0–12.0)
Neutro Abs: 3.2 10*3/uL (ref 1.4–7.7)
Neutrophils Relative %: 49.5 % (ref 43.0–77.0)
Platelets: 313 10*3/uL (ref 150.0–400.0)
RBC: 4.11 Mil/uL (ref 3.87–5.11)
RDW: 14.1 % (ref 11.5–15.5)
WBC: 6.6 10*3/uL (ref 4.0–10.5)

## 2019-11-12 LAB — PROTIME-INR
INR: 1.1 ratio — ABNORMAL HIGH (ref 0.8–1.0)
Prothrombin Time: 12.4 s (ref 9.6–13.1)

## 2019-11-12 LAB — APTT: aPTT: 41.7 s — ABNORMAL HIGH (ref 23.4–32.7)

## 2019-11-12 LAB — SEDIMENTATION RATE: Sed Rate: 38 mm/hr — ABNORMAL HIGH (ref 0–30)

## 2019-11-12 MED ORDER — ALPRAZOLAM 0.25 MG PO TABS: 0.2500 mg | ORAL_TABLET | Freq: Every day | ORAL | 0 refills | Status: DC

## 2019-11-12 NOTE — Assessment & Plan Note (Signed)
Worsened lately given 56th wedding anniversary and husband passed away last year. Refilled alprazolam to use in limited fashion. If not improving will consider SSRI.  Recommended counseling for grief.

## 2019-11-12 NOTE — Addendum Note (Signed)
Addended by: Ellamae Sia on: 11/12/2019 10:39 AM   Modules accepted: Orders

## 2019-11-12 NOTE — Patient Instructions (Signed)
Please stop at the lab to have labs drawn. Make follow up appointment if anxiety not improving over time.

## 2019-11-12 NOTE — Progress Notes (Signed)
Chief Complaint  Patient presents with  . Red Area on Right Temple    History of Present Illness: HPI  74 year old female presents for new onset rash on face.   She reports in last 4 says noted  On right temple  Woke up with it.. noted it mirror. No pain.  Right face felt swollen.  Mild ttp below right ear.  Started out red and has changed to more bruised looking.   History of PHN on right face. Stable control on gabapentin and nortriptyline.  Occ using acetaminophen.  No fall, or injury.   No eye pain, no eye injury. occ headache and low back pain.  no fever.  no easy bruising, no bleeding.   This visit occurred during the SARS-CoV-2 public health emergency.  Safety protocols were in place, including screening questions prior to the visit, additional usage of staff PPE, and extensive cleaning of exam room while observing appropriate contact time as indicated for disinfecting solutions.   COVID 19 screen:  No recent travel or known exposure to COVID19 The patient denies respiratory symptoms of COVID 19 at this time. The importance of social distancing was discussed today.     Review of Systems  Constitutional: Negative for chills and fever.  HENT: Negative for congestion and ear pain.   Eyes: Negative for pain and redness.  Respiratory: Negative for cough and shortness of breath.   Cardiovascular: Negative for chest pain, palpitations and leg swelling.  Gastrointestinal: Negative for abdominal pain, blood in stool, constipation, diarrhea, nausea and vomiting.  Genitourinary: Negative for dysuria.  Musculoskeletal: Negative for falls and myalgias.  Skin: Negative for rash.  Neurological: Negative for dizziness.  Psychiatric/Behavioral: Negative for depression. The patient is nervous/anxious.       Past Medical History:  Diagnosis Date  . Breast cancer ([place]) 12/2011  . Breast cancer ([place]) 12/2011   DCIS  . Complication of anesthesia    hard to wake up age 7-not  since  . COPD (chronic obstructive pulmonary disease) ([place])   . Fibromyalgia   . GERD (gastroesophageal reflux disease)   . History of radiation therapy 02/29/12 -04/21/12   right breast, 5040 cGy in 28 fx, boost to total 6240 cGy  . Osteoporosis   . Personal history of colonic polyps-adenomas 05/25/2012  . Personal history of radiation therapy   . PHN (postherpetic neuralgia) 2000   Right side of face  . Pneumonia 12/19/2018    reports that she has never smoked. She has never used smokeless tobacco. She reports current alcohol use. She reports that she does not use drugs.   Current Outpatient Medications:  .  acetaminophen (TYLENOL) 500 MG tablet, Take 1,000 mg by mouth every 4 (four) hours as needed for mild pain, moderate pain or headache. , Disp: , Rfl:  .  albuterol (PROVENTIL HFA;VENTOLIN HFA) 108 (90 Base) MCG/ACT inhaler, Inhale 2 puffs into the lungs every 6 (six) hours as needed for wheezing or shortness of breath., Disp: 1 Inhaler, Rfl: 12 .  cholecalciferol (VITAMIN D) 1000 units tablet, Take 4,000 Units by mouth daily., Disp: , Rfl:  .  Fluticasone-Umeclidin-Vilant (TRELEGY ELLIPTA) 100-62.5-25 MCG/INH AEPB, Inhale 1 puff into the lungs daily. Rinse mouth., Disp: 1 each, Rfl: 12 .  gabapentin (NEURONTIN) 600 MG tablet, Take 2 tablets three times a day, Disp: 540 tablet, Rfl: 3 .  nortriptyline (PAMELOR) 50 MG capsule, Take 3 capsules every night, Disp: 270 capsule, Rfl: 3 .  ondansetron (ZOFRAN ODT) 8 MG disintegrating  tablet, Take 1 tablet (8 mg total) by mouth every 8 (eight) hours as needed for nausea or vomiting., Disp: 20 tablet, Rfl: 1 .  traMADol (ULTRAM) 50 MG tablet, Take 1/2 to 1 tablet every night as needed for severe pain, Disp: 30 tablet, Rfl: 5   Observations/Objective: Blood pressure (!) 153/92, pulse 79, temperature (!) 96.8 F (36 C), temperature source Temporal, height 4' 9.5" (1.461 m), weight 174 lb 8 oz (79.2 kg), SpO2 97 %.  Physical  Exam Constitutional:      General: She is not in acute distress.    Appearance: Normal appearance. She is well-developed. She is obese. She is not ill-appearing or toxic-appearing.  HENT:     Head: Normocephalic.     Right Ear: Hearing, tympanic membrane, ear canal and external ear normal. Tympanic membrane is not erythematous, retracted or bulging.     Left Ear: Hearing, tympanic membrane, ear canal and external ear normal. Tympanic membrane is not erythematous, retracted or bulging.     Nose: No mucosal edema or rhinorrhea.     Right Sinus: No maxillary sinus tenderness or frontal sinus tenderness.     Left Sinus: No maxillary sinus tenderness or frontal sinus tenderness.     Mouth/Throat:     Pharynx: Uvula midline.  Eyes:     General: Lids are normal. Lids are everted, no foreign bodies appreciated.     Conjunctiva/sclera: Conjunctivae normal.     Pupils: Pupils are equal, round, and reactive to light.  Neck:     Thyroid: No thyroid mass or thyromegaly.     Vascular: No carotid bruit.     Trachea: Trachea normal.  Cardiovascular:     Rate and Rhythm: Normal rate and regular rhythm.     Pulses: Normal pulses.     Heart sounds: Normal heart sounds, S1 normal and S2 normal. No murmur heard.  No friction rub. No gallop.   Pulmonary:     Effort: Pulmonary effort is normal. No tachypnea or respiratory distress.     Breath sounds: Normal breath sounds. No decreased breath sounds, wheezing, rhonchi or rales.  Abdominal:     General: Bowel sounds are normal.     Palpations: Abdomen is soft.     Tenderness: There is no abdominal tenderness.  Musculoskeletal:     Cervical back: Normal range of motion and neck supple.  Skin:    General: Skin is warm and dry.     Findings: No rash.     Comments: Contusion/purpura, right temple.. changing to brownish resolving  Neurological:     Mental Status: She is alert.  Psychiatric:        Mood and Affect: Mood is not anxious or depressed.         Speech: Speech normal.        Behavior: Behavior normal. Behavior is cooperative.        Thought Content: Thought content normal.        Judgment: Judgment normal.      Assessment and Plan   Purpura ([place]) No known injury.  Area looks vascular/contusion/purpura.  Eval with sed rate, cbc and clotting eval.      Situational anxiety Worsened lately given 56th wedding anniversary and husband passed away last year. Refilled alprazolam to use in limited fashion. If not improving will consider SSRI.  Recommended counseling for grief.     Eliezer Lofts, MD

## 2019-11-12 NOTE — Assessment & Plan Note (Signed)
No known injury.  Area looks vascular/contusion/purpura.  Eval with sed rate, cbc and clotting eval.

## 2019-12-31 ENCOUNTER — Telehealth: Payer: Self-pay

## 2019-12-31 NOTE — Telephone Encounter (Signed)
[facility] Day - Client TELEPHONE ADVICE RECORD [facility] Patient Name: Katie Zimmerman Gender: Female DOB: 10/02/1945 Age: 74 Y 11 M 29 D Return Phone Number: [phone] (Primary), [phone] (Secondary) Address: City/State/Zip: [place] [address] Client [facility] - Client Client Site [place] - Day Physician Eliezer Lofts - MD Contact Type Call Who Is Calling Patient / Member / Family / Caregiver Call Type Triage / Clinical Relationship To Patient Self Return Phone Number [phone] (Primary) Chief Complaint Flank Pain Reason for Call Symptomatic / Request for [place] is asking to be seen today. Yesterday and this morning she is weak and dizzy and nauseous. She was restless last night and her left flank hurts (more toward the front than the back.). Suspects UTI. Translation No Nurse Assessment Nurse: Gildardo Pounds, RN, Amy Date/Time (Eastern Time): 12/31/2019 9:26:28 AM Confirm and document reason for call. If symptomatic, describe symptoms. ---Caller states that yesterday and this morning, she is weak, dizzy, and nauseous. The dizziness is mainly when she is trying to walk. It was worse last night. She was restless last night, and her left flank hurts (more towards the front than the back). It started during the night. She suspects a UTI. No burning with urination. She is not sure if she is running a fever. She has been sweating. Has the patient had close contact with a person known or suspected to have the novel coronavirus illness OR traveled / lives in area with major community spread (including international travel) in the last 14 days from the onset of symptoms? * If Asymptomatic, screen for exposure and travel within the last 14 days. ---No Does the patient have any new or worsening symptoms? ---Yes Will a triage be completed? ---Yes Related visit to  physician within the last 2 weeks? ---No Does the PT have any chronic conditions? (i.e. diabetes, asthma, this includes High risk factors for pregnancy, etc.) ---Yes List chronic conditions. ---chronic bronchitis Is this a behavioral health or substance abuse call? ---No PLEASE NOTE: All timestamps contained within this report are represented as [place] Standard Time. CONFIDENTIALTY NOTICE: This fax transmission is intended only for the addressee. It contains information that is legally privileged, confidential or otherwise protected from use or disclosure. If you are not the intended recipient, you are strictly prohibited from reviewing, disclosing, copying using or disseminating any of this information or taking any action in reliance on or regarding this information. If you have received this fax in error, please notify us immediately by telephone so that we can arrange for its return to [place]. Phone: [phone], Toll-Free: [phone], Fax: [phone] Page: 2 of 2 Call Id: [id number] Guidelines Guideline Title Affirmed Question Affirmed Notes Nurse Date/Time Eilene Ghazi Time) Dizziness - Vertigo [1] MODERATE dizziness (e.g., vertigo; feels very unsteady, interferes with normal activities) AND [2] has NOT been evaluated by physician for this Avery, RN, Amy 12/31/2019 9:31:40 AM Disp. Time Eilene Ghazi Time) Disposition Final User 12/31/2019 9:33:58 AM See PCP within 24 Hours Yes [place], RN, Amy Caller Disagree/Comply Comply Caller Understands Yes PreDisposition InappropriateToAsk Care Advice Given Per Guideline CALL BACK IF: * Severe headache occurs * Weakness develops in an arm or leg * Unable to walk without falling * You become worse. CARE ADVICE given per Dizziness - Vertigo (Adult) guideline. SEE PCP WITHIN 24 HOURS: * IF OFFICE WILL BE OPEN: You need to be examined within the next 24 hours. Call your doctor (or NP/PA) when the  office opens and make  an appointment. Comments User: Wayne Sever, RN Date/Time Eilene Ghazi Time): 12/31/2019 9:37:42 AM Spoke with Shapell at the backline. Attempted to warm transfer the caller, but she did not respond once connected, so Shapell is going to call her back. Referrals Warm transfer to backline

## 2019-12-31 NOTE — Telephone Encounter (Signed)
Pt already has appt with Dr Lorelei Pont 01/01/20 at 8 AM.

## 2019-12-31 NOTE — Progress Notes (Signed)
Katie Zerby T. Wynona Duhamel, MD, [place] at [facility] [place], [address]  Phone: [phone]  FAX: [phone]  [name] - 74 y.o. female  MRN [id number]  Date of Birth: 1945-07-13  Date: 01/01/2020  PCP: [name], MD  Referral: [name], MD  Chief Complaint  Patient presents with  . Flank Pain    left  . Dizziness    started Monday  . Nausea  . Emesis    This visit occurred during the SARS-CoV-2 public health emergency.  Safety protocols were in place, including screening questions prior to the visit, additional usage of staff PPE, and extensive cleaning of exam room while observing appropriate contact time as indicated for disinfecting solutions.   Subjective:   Katie Zimmerman is a 74 y.o. very pleasant female patient who presents with the following:  Since Monday, she has been feeling globally unwell.  She is here with her sister who provides additional history.  She has been nauseated and throwing up since Monday.  She is not eating.  She is not having any diarrhea.  She is sweating and also feeling chills.  Globally she is tired.  Feeling unwell.  She also has some dizziness with head rotational movements and with standing up from a seated position.  She feels as if the room is spinning around her.  She does have a history of kidney stones, she does have flank pain.  She also has a history of complicated UTI.  Intra-abdominal surgery history of appendectomy, surgery of her ovaries in high school age, as well as BTL.  Not walking straight line  ? Unclear if vertigo - has had before.   Nauseated and throwing up some.  Not eating.  Has been throwing up last night and the night before.  When she was sitting down.  A couple of times a day. Feels hot. Sweating all the time.  L lateral flank pain  Coughing, not new and has COPD  + vertigo  Appendectomy, ovary  surgery, BTL Review of Systems is noted in the HPI, as appropriate  Patient Active Problem List   Diagnosis Date Noted  . Purpura ([place]) 11/12/2019  . Situational anxiety 11/12/2019  . Grieving 09/03/2019  . Elevated blood pressure reading without diagnosis of hypertension 04/06/2019  . Leukocytosis 01/01/2019  . Cough, persistent 10/17/2017  . Headache 02/21/2017  . COPD with acute exacerbation ([place]) 12/30/2016  . Class 2 severe obesity due to excess calories with serious comorbidity and body mass index (BMI) of 36.0 to 36.9 in adult ([place]) 08/09/2016  . Increased endometrial stripe thickness 03/18/2016  . [place] arthritis 01/22/2015  . Counseling regarding end of life decision making 01/22/2015  . Osteoporosis 01/28/2014  . Hearing loss in right ear 01/20/2014  . Personal history of colonic polyps-adenomas 05/25/2012  . GERD (gastroesophageal reflux disease)   . History of radiation therapy   . Cancer of right breast, stage 0 01/26/2012  . Prediabetes 11/22/2011  . High cholesterol 11/22/2011  . TEMPOROMANDIBULAR JOINT PAIN 11/04/2009  . RENAL CALCULUS 05/04/2009  . Post herpetic neuralgia 04/14/2009    Past Medical History:  Diagnosis Date  . Breast cancer ([place]) 12/2011  . Breast cancer ([place]) 12/2011   DCIS  . Complication of anesthesia    hard to wake up age 31-not since  . COPD (chronic obstructive pulmonary disease) ([place])   . Fibromyalgia   .  GERD (gastroesophageal reflux disease)   . History of radiation therapy 02/29/12 -04/21/12   right breast, 5040 cGy in 28 fx, boost to total 6240 cGy  . Osteoporosis   . Personal history of colonic polyps-adenomas 05/25/2012  . Personal history of radiation therapy   . PHN (postherpetic neuralgia) 2000   Right side of face  . Pneumonia 12/19/2018    Past Surgical History:  Procedure Laterality Date  . APPENDECTOMY    . BIOPSY BREAST  01/10/12   Right breast needle cor biopsy  . BREAST BIOPSY  01/10/2012  . BREAST CYST  ASPIRATION  01/10/2012  . BREAST LUMPECTOMY Right 01/26/2012   Right/ERPR+ DCIS, right ax snbx  . FOOT SURGERY  1996   rt/lt great toes  . GLAUCOMA SURGERY  1993   laser  . OVARIAN CYST SURGERY  1964  . TUBAL LIGATION  1976    Family History  Problem Relation Age of Onset  . Hypertension Mother   . Cancer Mother        LIVER cancer  . Hypertension Father   . Diabetes Father   . Heart failure Father   . Breast cancer Sister 73       NOT hormone receptor breast cancer  . Breast cancer Maternal Aunt 75  . Breast cancer Other        dx in her 56s  . Breast cancer Cousin 63       maternal cousin  . Breast cancer Cousin        maternal cousin  . Breast cancer Cousin        maternal cousin  . Cancer Cousin        unknown form of cancer; maternal cousin  . Cancer Cousin        unknown form of cancer; maternal cousin  . Cancer Cousin 11       ? lung cancer; maternal cousin  . Breast cancer Cousin        dx 56s-50s; paternal cousin  . Breast cancer Cousin        dx in her 22s; paternal cousin  . Colon cancer Neg Hx   . Stomach cancer Neg Hx       Objective:   BP (!) 130/100   Pulse 80   Temp 98.2 F (36.8 C) (Temporal)   Ht 4' 9.5" (1.461 m)   Wt 168 lb 4 oz (76.3 kg)   SpO2 95%   BMI 35.78 kg/m   GEN: Sweating, tired.  Pale.  Uncomfortable in appearance. HEENT: Atraumatic, Normocephalic. Neck supple. No masses. CV: RRR, No M/G/R. No JVD. No thrill. No extra heart sounds. PULM: CTA B, no wheezes, crackles, rhonchi. No retractions. No resp. distress. No accessory muscle use. EXTR: No c/c/e ABD: S, lower abdominal tenderness left greater than right.  No rebound tenderness., ND, + BS, No HSM  NEURO Normal gait.  PSYCH: Normally interactive. Conversant.   Laboratory and Imaging Data:  Results for orders placed or performed in visit on 01/01/20  POCT Urinalysis Dipstick (Automated)  Result Value Ref Range   Color, UA Yellow    Clarity, UA Clear    Glucose, UA  Negative Negative   Bilirubin, UA Negative    Ketones, UA Small    Spec Grav, UA 1.025 1.010 - 1.025   Blood, UA Small    pH, UA 6.0 5.0 - 8.0   Protein, UA Positive (A) Negative   Urobilinogen, UA 0.2 0.2 or 1.0 E.U./dL   Nitrite,  UA Negative    Leukocytes, UA Negative Negative     Assessment and Plan:     ICD-10-CM   1. Lower abdominal pain of unknown etiology  T70.01 Basic metabolic panel    Hepatic function panel    CBC with Differential/Platelet    Lipase    CT Abdomen Pelvis W Contrast  2. Flank pain  V49.4 Basic metabolic panel    Hepatic function panel    CBC with Differential/Platelet    Lipase    CT Abdomen Pelvis W Contrast  3. Dysuria  R30.0 POCT Urinalysis Dipstick (Automated)    Urine Culture  4. Vertigo  R42   5. Intractable vomiting with nausea, unspecified vomiting type  W96.7 Basic metabolic panel    Hepatic function panel    CBC with Differential/Platelet    Lipase    CT Abdomen Pelvis W Contrast   Total encounter time: 40 minutes. This includes total time spent on the day of encounter.  Additional history provided by sister, record review, arranging for stat CT of the abdomen and pelvis.  Explanation of potential significant abnormalities.  In this case, I think that an acute abdomen is entirely possible.  Obtain a CT of the abdomen and pelvis for evaluation of acute abnormality including possible acute abdomen, bowel obstruction, diverticulitis, or other intra-abdominal process.  Check laboratories as above.  UA is negative for nitrites as well as leukocyte esterase.  Only a mild amount of blood.  UTI seems quite unlikely, and renal stone also is unlikely in this case.  She will stay at the hospital until the stat CT is read and she gets further direction.  Follow-up: No follow-ups on file.  No orders of the defined types were placed in this encounter.  Medications Discontinued During This Encounter  Medication Reason  . cholecalciferol (VITAMIN  D) 1000 units tablet Change in therapy   Orders Placed This Encounter  Procedures  . Urine Culture  . CT Abdomen Pelvis W Contrast  . Basic metabolic panel  . Hepatic function panel  . CBC with Differential/Platelet  . Lipase  . POCT Urinalysis Dipstick (Automated)    Signed,  Houa Ackert T. Meryle Pugmire, MD   Outpatient Encounter Medications as of 01/01/2020  Medication Sig  . acetaminophen (TYLENOL) 500 MG tablet Take 1,000 mg by mouth every 4 (four) hours as needed for mild pain, moderate pain or headache.   . albuterol (PROVENTIL HFA;VENTOLIN HFA) 108 (90 Base) MCG/ACT inhaler Inhale 2 puffs into the lungs every 6 (six) hours as needed for wheezing or shortness of breath.  . ALPRAZolam (XANAX) 0.25 MG tablet Take 1 tablet (0.25 mg total) by mouth at bedtime. Need APPT before refills if anxiety continuing.  Marland Kitchen aspirin EC 81 MG tablet Take 81 mg by mouth daily. Swallow whole.  . Biotin w/ Vitamins C & E (HAIR/SKIN/NAILS PO) Take 1 tablet by mouth daily.  . Cholecalciferol (VITAMIN D) 50 MCG (2000 UT) tablet Take 2,000 Units by mouth daily.  . Fluticasone-Umeclidin-Vilant (TRELEGY ELLIPTA) 100-62.5-25 MCG/INH AEPB Inhale 1 puff into the lungs daily. Rinse mouth.  . gabapentin (NEURONTIN) 600 MG tablet Take 2 tablets three times a day  . nortriptyline (PAMELOR) 50 MG capsule Take 3 capsules every night  . ondansetron (ZOFRAN ODT) 8 MG disintegrating tablet Take 1 tablet (8 mg total) by mouth every 8 (eight) hours as needed for nausea or vomiting.  . traMADol (ULTRAM) 50 MG tablet Take 1/2 to 1 tablet every night as needed for severe pain  .  vitamin B-12 (CYANOCOBALAMIN) 1000 MCG tablet Take 1,000 mcg by mouth daily.  . [DISCONTINUED] cholecalciferol (VITAMIN D) 1000 units tablet Take 4,000 Units by mouth daily.   No facility-administered encounter medications on file as of 01/01/2020.

## 2020-01-01 ENCOUNTER — Other Ambulatory Visit: Payer: Self-pay

## 2020-01-01 ENCOUNTER — Encounter: Payer: Self-pay | Admitting: Family Medicine

## 2020-01-01 ENCOUNTER — Ambulatory Visit
Admission: RE | Admit: 2020-01-01 | Discharge: 2020-01-01 | Disposition: A | Discharge status: Home / Self Care | Payer: Medicare Other | Source: Ambulatory Visit | Attending: Family Medicine | Admitting: Family Medicine

## 2020-01-01 ENCOUNTER — Ambulatory Visit (INDEPENDENT_AMBULATORY_CARE_PROVIDER_SITE_OTHER): Payer: Medicare Other | Admitting: Family Medicine

## 2020-01-01 VITALS — BP 130/100 | HR 80 | Temp 98.2°F | Ht <= 58 in | Wt 168.2 lb

## 2020-01-01 DIAGNOSIS — R1030 Lower abdominal pain, unspecified: Secondary | ICD-10-CM

## 2020-01-01 DIAGNOSIS — R42 Dizziness and giddiness: Secondary | ICD-10-CM | POA: Diagnosis not present

## 2020-01-01 DIAGNOSIS — K8020 Calculus of gallbladder without cholecystitis without obstruction: Secondary | ICD-10-CM | POA: Diagnosis not present

## 2020-01-01 DIAGNOSIS — R112 Nausea with vomiting, unspecified: Secondary | ICD-10-CM | POA: Diagnosis not present

## 2020-01-01 DIAGNOSIS — R9389 Abnormal findings on diagnostic imaging of other specified body structures: Secondary | ICD-10-CM

## 2020-01-01 DIAGNOSIS — R109 Unspecified abdominal pain: Secondary | ICD-10-CM

## 2020-01-01 DIAGNOSIS — R300 Dysuria: Secondary | ICD-10-CM

## 2020-01-01 DIAGNOSIS — N200 Calculus of kidney: Secondary | ICD-10-CM | POA: Diagnosis not present

## 2020-01-01 LAB — CBC WITH DIFFERENTIAL/PLATELET
Basophils Absolute: 0.2 10*3/uL — ABNORMAL HIGH (ref 0.0–0.1)
Basophils Relative: 1.4 % (ref 0.0–3.0)
Eosinophils Absolute: 0.2 10*3/uL (ref 0.0–0.7)
Eosinophils Relative: 1.5 % (ref 0.0–5.0)
HCT: 44.3 % (ref 36.0–46.0)
Hemoglobin: 14.5 g/dL (ref 12.0–15.0)
Lymphocytes Relative: 17.3 % (ref 12.0–46.0)
Lymphs Abs: 1.9 10*3/uL (ref 0.7–4.0)
MCHC: 32.7 g/dL (ref 30.0–36.0)
MCV: 89.5 fl (ref 78.0–100.0)
Monocytes Absolute: 0.8 10*3/uL (ref 0.1–1.0)
Monocytes Relative: 6.7 % (ref 3.0–12.0)
Neutro Abs: 8.2 10*3/uL — ABNORMAL HIGH (ref 1.4–7.7)
Neutrophils Relative %: 73.1 % (ref 43.0–77.0)
Platelets: 397 10*3/uL (ref 150.0–400.0)
RBC: 4.95 Mil/uL (ref 3.87–5.11)
RDW: 14.2 % (ref 11.5–15.5)
WBC: 11.2 10*3/uL — ABNORMAL HIGH (ref 4.0–10.5)

## 2020-01-01 LAB — POC URINALSYSI DIPSTICK (AUTOMATED)
Bilirubin, UA: NEGATIVE
Glucose, UA: NEGATIVE
Leukocytes, UA: NEGATIVE
Nitrite, UA: NEGATIVE
Protein, UA: POSITIVE — AB
Spec Grav, UA: 1.025 (ref 1.010–1.025)
Urobilinogen, UA: 0.2 E.U./dL
pH, UA: 6 (ref 5.0–8.0)

## 2020-01-01 LAB — HEPATIC FUNCTION PANEL
ALT: 22 U/L (ref 0–35)
AST: 23 U/L (ref 0–37)
Albumin: 4.4 g/dL (ref 3.5–5.2)
Alkaline Phosphatase: 111 U/L (ref 39–117)
Bilirubin, Direct: 0 mg/dL (ref 0.0–0.3)
Total Bilirubin: 0.4 mg/dL (ref 0.2–1.2)
Total Protein: 8.2 g/dL (ref 6.0–8.3)

## 2020-01-01 LAB — BASIC METABOLIC PANEL
BUN: 12 mg/dL (ref 6–23)
CO2: 27 mEq/L (ref 19–32)
Calcium: 9.8 mg/dL (ref 8.4–10.5)
Chloride: 102 mEq/L (ref 96–112)
Creatinine, Ser: 0.93 mg/dL (ref 0.40–1.20)
GFR: 58.93 mL/min — ABNORMAL LOW (ref >60.00)
Glucose, Bld: 117 mg/dL — ABNORMAL HIGH (ref 70–99)
Potassium: 3.9 mEq/L (ref 3.5–5.1)
Sodium: 136 mEq/L (ref 135–145)

## 2020-01-01 LAB — POCT I-STAT CREATININE: Creatinine, Ser: 1 mg/dL (ref 0.44–1.00)

## 2020-01-01 LAB — LIPASE: Lipase: 29 U/L (ref 11.0–59.0)

## 2020-01-01 MED ORDER — CIPROFLOXACIN HCL 500 MG PO TABS
500.0000 mg | ORAL_TABLET | Freq: Two times a day (BID) | ORAL | 0 refills | Status: AC
Start: 2020-01-01 — End: 2020-01-11

## 2020-01-01 MED ORDER — IOHEXOL 300 MG/ML  SOLN
100.0000 mL | Freq: Once | INTRAMUSCULAR | Status: AC | PRN
  Administered 2020-01-01: 100 mL via INTRAVENOUS

## 2020-01-01 MED ORDER — METRONIDAZOLE 500 MG PO TABS
500.0000 mg | ORAL_TABLET | Freq: Three times a day (TID) | ORAL | 0 refills | Status: AC
Start: 2020-01-01 — End: 2020-01-11

## 2020-01-01 NOTE — Patient Instructions (Addendum)
Urgent CT of the abdomen and pelvis  STAY until you hear from [place] if ok to go

## 2020-01-01 NOTE — Addendum Note (Signed)
Addended by: Owens Loffler on: 01/01/2020 02:01 PM   Modules accepted: Orders

## 2020-01-02 LAB — URINE CULTURE
MICRO NUMBER:: 10786846
SPECIMEN QUALITY:: ADEQUATE

## 2020-01-03 ENCOUNTER — Telehealth: Payer: Self-pay | Admitting: Family Medicine

## 2020-01-03 NOTE — Progress Notes (Signed)
  Chronic Care Management   Note  01/03/2020 Name: Katie Zimmerman MRN: [id number] DOB: [birth year]  Katie Zimmerman is a 74 y.o. year old female who is a primary care patient of Bedsole, Amy E, MD. I reached out to [name] by phone today in response to a referral sent by [name]'s PCP, [name], MD.   [name] was given information about Chronic Care Management services today including:  1. CCM service includes personalized support from designated clinical staff supervised by her physician, including individualized plan of care and coordination with other care providers 2. 24/7 contact phone numbers for assistance for urgent and routine care needs. 3. Service will only be billed when office clinical staff spend 20 minutes or more in a month to coordinate care. 4. Only one practitioner may furnish and bill the service in a calendar month. 5. The patient may stop CCM services at any time (effective at the end of the month) by phone call to the office staff.   Patient agreed to services and verbal consent obtained.   Follow up plan:   Carley Perdue [employer] Scheduler

## 2020-01-07 ENCOUNTER — Telehealth: Payer: Medicare Other

## 2020-01-18 ENCOUNTER — Other Ambulatory Visit: Payer: Self-pay | Admitting: Family Medicine

## 2020-01-20 NOTE — Telephone Encounter (Signed)
Last office visit 01/01/2020 with Dr. Lorelei Pont for lower abdominal pain.  Last refilled 04/10/2019 for #20 with 1 refill.  CPE scheduled for 03/06/2020.

## 2020-02-10 ENCOUNTER — Telehealth: Payer: Self-pay | Admitting: Family Medicine

## 2020-02-10 NOTE — Telephone Encounter (Signed)
LVM for pt to rtn my call to r/s appt with nha on 03/02/20

## 2020-03-01 ENCOUNTER — Telehealth: Payer: Self-pay | Admitting: Family Medicine

## 2020-03-01 DIAGNOSIS — E7800 Pure hypercholesterolemia, unspecified: Secondary | ICD-10-CM

## 2020-03-01 DIAGNOSIS — R7303 Prediabetes: Secondary | ICD-10-CM

## 2020-03-01 NOTE — Telephone Encounter (Signed)
-----   Message from Cloyd Stagers, RT sent at 02/18/2020  2:30 PM EDT ----- Regarding: Lab Orders for Monday 10.4.2021 Please place lab orders for Monday 10.4.2021, office visit for physical on Friday 10.8.2021 Thank you, Dyke Maes RT(R)

## 2020-03-02 ENCOUNTER — Ambulatory Visit: Payer: Medicare Other

## 2020-03-02 ENCOUNTER — Other Ambulatory Visit (INDEPENDENT_AMBULATORY_CARE_PROVIDER_SITE_OTHER): Payer: Medicare Other

## 2020-03-02 ENCOUNTER — Other Ambulatory Visit: Payer: Self-pay

## 2020-03-02 DIAGNOSIS — E7800 Pure hypercholesterolemia, unspecified: Secondary | ICD-10-CM

## 2020-03-02 DIAGNOSIS — R7303 Prediabetes: Secondary | ICD-10-CM

## 2020-03-02 LAB — COMPREHENSIVE METABOLIC PANEL
ALT: 18 U/L (ref 0–35)
AST: 20 U/L (ref 0–37)
Albumin: 4.1 g/dL (ref 3.5–5.2)
Alkaline Phosphatase: 102 U/L (ref 39–117)
BUN: 12 mg/dL (ref 6–23)
CO2: 28 mEq/L (ref 19–32)
Calcium: 9.2 mg/dL (ref 8.4–10.5)
Chloride: 105 mEq/L (ref 96–112)
Creatinine, Ser: 1.02 mg/dL (ref 0.40–1.20)
GFR: 52.95 mL/min — ABNORMAL LOW (ref >60.00)
Glucose, Bld: 102 mg/dL — ABNORMAL HIGH (ref 70–99)
Potassium: 3.7 mEq/L (ref 3.5–5.1)
Sodium: 141 mEq/L (ref 135–145)
Total Bilirubin: 0.6 mg/dL (ref 0.2–1.2)
Total Protein: 7.6 g/dL (ref 6.0–8.3)

## 2020-03-02 LAB — LIPID PANEL
Cholesterol: 180 mg/dL (ref 0–200)
HDL: 49.9 mg/dL (ref >39.00)
LDL Cholesterol: 110 mg/dL — ABNORMAL HIGH (ref 0–99)
NonHDL: 130.23
Total CHOL/HDL Ratio: 4
Triglycerides: 103 mg/dL (ref 0.0–149.0)
VLDL: 20.6 mg/dL (ref 0.0–40.0)

## 2020-03-02 LAB — HEMOGLOBIN A1C: Hgb A1c MFr Bld: 6.4 % (ref 4.6–6.5)

## 2020-03-03 NOTE — Progress Notes (Signed)
No critical labs need to be addressed urgently. We will discuss labs in detail at upcoming office visit.   

## 2020-03-06 ENCOUNTER — Telehealth: Payer: Self-pay

## 2020-03-06 ENCOUNTER — Emergency Department: Payer: Medicare Other

## 2020-03-06 ENCOUNTER — Emergency Department
Admission: EM | Admit: 2020-03-06 | Discharge: 2020-03-06 | Disposition: A | Discharge status: Home / Self Care | Payer: Medicare Other | Attending: Emergency Medicine | Admitting: Emergency Medicine

## 2020-03-06 ENCOUNTER — Encounter: Payer: Self-pay | Admitting: Emergency Medicine

## 2020-03-06 ENCOUNTER — Other Ambulatory Visit: Payer: Self-pay

## 2020-03-06 ENCOUNTER — Encounter: Payer: Medicare Other | Admitting: Family Medicine

## 2020-03-06 DIAGNOSIS — R42 Dizziness and giddiness: Secondary | ICD-10-CM

## 2020-03-06 DIAGNOSIS — Z7982 Long term (current) use of aspirin: Secondary | ICD-10-CM | POA: Diagnosis not present

## 2020-03-06 DIAGNOSIS — I10 Essential (primary) hypertension: Secondary | ICD-10-CM | POA: Diagnosis not present

## 2020-03-06 DIAGNOSIS — R1111 Vomiting without nausea: Secondary | ICD-10-CM | POA: Diagnosis not present

## 2020-03-06 DIAGNOSIS — H8111 Benign paroxysmal vertigo, right ear: Secondary | ICD-10-CM | POA: Diagnosis not present

## 2020-03-06 DIAGNOSIS — J441 Chronic obstructive pulmonary disease with (acute) exacerbation: Secondary | ICD-10-CM | POA: Insufficient documentation

## 2020-03-06 LAB — COMPREHENSIVE METABOLIC PANEL
ALT: 22 U/L (ref 0–44)
AST: 34 U/L (ref 15–41)
Albumin: 4.1 g/dL (ref 3.5–5.0)
Alkaline Phosphatase: 107 U/L (ref 38–126)
Anion gap: 12 (ref 5–15)
BUN: 7 mg/dL — ABNORMAL LOW (ref 8–23)
CO2: 22 mmol/L (ref 22–32)
Calcium: 9.5 mg/dL (ref 8.9–10.3)
Chloride: 106 mmol/L (ref 98–111)
Creatinine, Ser: 0.87 mg/dL (ref 0.44–1.00)
GFR, Estimated: >60 mL/min (ref >60)
Glucose, Bld: 118 mg/dL — ABNORMAL HIGH (ref 70–99)
Potassium: 3.8 mmol/L (ref 3.5–5.1)
Sodium: 140 mmol/L (ref 135–145)
Total Bilirubin: 0.9 mg/dL (ref 0.3–1.2)
Total Protein: 8.1 g/dL (ref 6.5–8.1)

## 2020-03-06 LAB — CBC
HCT: 44.5 % (ref 36.0–46.0)
Hemoglobin: 14.5 g/dL (ref 12.0–15.0)
MCH: 29.4 pg (ref 26.0–34.0)
MCHC: 32.6 g/dL (ref 30.0–36.0)
MCV: 90.3 fL (ref 80.0–100.0)
Platelets: 253 10*3/uL (ref 150–400)
RBC: 4.93 MIL/uL (ref 3.87–5.11)
RDW: 13.4 % (ref 11.5–15.5)
WBC: 10.7 10*3/uL — ABNORMAL HIGH (ref 4.0–10.5)
nRBC: 0 % (ref 0.0–0.2)

## 2020-03-06 LAB — TROPONIN I (HIGH SENSITIVITY): Troponin I (High Sensitivity): 8 ng/L (ref <18)

## 2020-03-06 MED ORDER — MECLIZINE HCL 25 MG PO TABS: 25.0000 mg | ORAL_TABLET | Freq: Three times a day (TID) | ORAL | 1 refills | Status: DC | PRN

## 2020-03-06 MED ORDER — MECLIZINE HCL 25 MG PO TABS
25.0000 mg | ORAL_TABLET | Freq: Once | ORAL | Status: AC
  Administered 2020-03-06: 25 mg via ORAL
  Filled 2020-03-06: qty 1

## 2020-03-06 MED ORDER — GABAPENTIN 300 MG PO CAPS
1200.0000 mg | ORAL_CAPSULE | Freq: Once | ORAL | Status: AC
  Administered 2020-03-06: 1200 mg via ORAL
  Filled 2020-03-06: qty 4

## 2020-03-06 NOTE — ED Notes (Signed)
Pt given meal tray and water at this time 

## 2020-03-06 NOTE — ED Notes (Signed)
Pt given warm blanket.

## 2020-03-06 NOTE — Discharge Instructions (Signed)
You were seen in the ED because of your dizziness.  We did blood work, heart testing and CT scan of your brain that were all reassuring.  You are being discharged with a prescription for meclizine/Antivert.  This is a medication to take as needed, up to 3 times per day for similar vertigo sensations. If you have persistent vertigo that is not improved with the above medication, especially with headache, passing out or strokelike symptoms, please return to the ED.

## 2020-03-06 NOTE — ED Triage Notes (Signed)
Pt states severe dizziness with mild nausea.  States had vertigo in August and this is somewhat similar, but the dizziness is constant and not just related to position changes.  Pt states symptoms started last night and have continued today.  States is having some SHOB, especially with walking.

## 2020-03-06 NOTE — ED Provider Notes (Signed)
[facility] Emergency Department Provider Note ____________________________________________   First MD Initiated Contact with Patient 03/06/20 1804     (approximate)  I have reviewed the triage vital signs and the nursing notes.  HISTORY  Chief Complaint Dizziness   HPI Katie Zimmerman is a 74 y.o. femalewho presents to the ED for evaluation of dizziness.  Chart review indicates history of GERD, fibromyalgia. Patient self-reports a history of vertigo.    Patient reports 2 days of vertiginous dizziness that is only transiently improved at home with Dramamine administration.  She denies any falls, trauma, headache.  She reports associated nausea without vomiting.  Denies fevers, auditory changes.  She reports symptoms are significantly worsened when she turns her head, but has some residual dizziness while she is laying still.  She reports improving symptoms when she lays very still.  She reports taking OTC medication, but she thinks is a Dramamine, with improving symptoms, "but it always comes back.  "    Past Medical History:  Diagnosis Date   Breast cancer ([place]) 12/2011   Breast cancer ([place]) 06/8313   DCIS   Complication of anesthesia    hard to wake up age 57-not since   COPD (chronic obstructive pulmonary disease) ([place])    Fibromyalgia    GERD (gastroesophageal reflux disease)    History of radiation therapy 02/29/12 -04/21/12   right breast, 5040 cGy in 28 fx, boost to total 6240 cGy   Osteoporosis    Personal history of colonic polyps-adenomas 05/25/2012   Personal history of radiation therapy    PHN (postherpetic neuralgia) 2000   Right side of face   Pneumonia 12/19/2018    Patient Active Problem List   Diagnosis Date Noted   Purpura ([place]) 11/12/2019   Situational anxiety 11/12/2019   Grieving 09/03/2019   Elevated blood pressure reading without diagnosis of hypertension 04/06/2019   Leukocytosis 01/01/2019   Cough,  persistent 10/17/2017   Headache 02/21/2017   COPD with acute exacerbation ([place]) 12/30/2016   Class 2 severe obesity due to excess calories with serious comorbidity and body mass index (BMI) of 36.0 to 36.9 in adult ([place]) 08/09/2016   Increased endometrial stripe thickness 03/18/2016   CMC arthritis 01/22/2015   Counseling regarding end of life decision making 01/22/2015   Osteoporosis 01/28/2014   Hearing loss in right ear 01/20/2014   Personal history of colonic polyps-adenomas 05/25/2012   GERD (gastroesophageal reflux disease)    History of radiation therapy    Cancer of right breast, stage 0 01/26/2012   Prediabetes 11/22/2011   High cholesterol 11/22/2011   TEMPOROMANDIBULAR JOINT PAIN 11/04/2009   RENAL CALCULUS 05/04/2009   Post herpetic neuralgia 04/14/2009    Past Surgical History:  Procedure Laterality Date   APPENDECTOMY     BIOPSY BREAST  01/10/12   Right breast needle cor biopsy   BREAST BIOPSY  01/10/2012   BREAST CYST ASPIRATION  01/10/2012   BREAST LUMPECTOMY Right 01/26/2012   Right/ERPR+ DCIS, right ax snbx   FOOT SURGERY  1996   rt/lt great toes   GLAUCOMA SURGERY  1993   laser   OVARIAN CYST [place]    Prior to Admission medications   Medication Sig Start Date End Date Taking? Authorizing Provider  acetaminophen (TYLENOL) 500 MG tablet Take 1,000 mg by mouth every 4 (four) hours as needed for mild pain, moderate pain or headache.     [provider]  ALPRAZolam (  XANAX) 0.25 MG tablet Take 1 tablet (0.25 mg total) by mouth at bedtime. Need APPT before refills if anxiety continuing. 11/12/19   [name], MD  aspirin EC 81 MG tablet Take 81 mg by mouth daily. Swallow whole.    [provider]  Cholecalciferol (VITAMIN D) 50 MCG (2000 UT) tablet Take 2,000 Units by mouth daily.    [provider]  gabapentin (NEURONTIN) 600 MG tablet Take 2 tablets three times a day Patient  taking differently: Take 1,200 mg by mouth 3 (three) times daily. Take 2 tablets three times a day 08/13/19   Cameron Sprang, MD  meclizine (ANTIVERT) 25 MG tablet Take 1 tablet (25 mg total) by mouth 3 (three) times daily as needed for dizziness. 03/06/20   Vladimir Crofts, MD  nortriptyline (PAMELOR) 50 MG capsule Take 3 capsules every night Patient taking differently: Take 150 mg by mouth at bedtime. Take 3 capsules every night 08/13/19   Cameron Sprang, MD  vitamin B-12 (CYANOCOBALAMIN) 1000 MCG tablet Take 1,000 mcg by mouth daily.    [provider]    Allergies Penicillins, Venlafaxine, and Sulfonamide derivatives  Family History  Problem Relation Age of Onset   Hypertension Mother    Cancer Mother        LIVER cancer   Hypertension Father    Diabetes Father    Heart failure Father    Breast cancer Sister 26       NOT hormone receptor breast cancer   Breast cancer Maternal Aunt 75   Breast cancer Other        dx in her 41s   Breast cancer Cousin 49       maternal cousin   Breast cancer Cousin        maternal cousin   Breast cancer Cousin        maternal cousin   Cancer Cousin        unknown form of cancer; maternal cousin   Cancer Cousin        unknown form of cancer; maternal cousin   Cancer Cousin 12       ? lung cancer; maternal cousin   Breast cancer Cousin        dx 27s-50s; paternal cousin   Breast cancer Cousin        dx in her 51s; paternal cousin   Colon cancer Neg Hx    Stomach cancer Neg Hx     Social History Social History   Tobacco Use   Smoking status: Never Smoker   Smokeless tobacco: Never Used  Scientific laboratory technician Use: Never used  Substance Use Topics   Alcohol use: Yes    Comment: occasional glass of wine   Drug use: No    Review of Systems  Constitutional: No fever/chills Eyes: No visual changes. ENT: No sore throat. Cardiovascular: Denies chest pain. Respiratory: Denies shortness of  breath. Gastrointestinal: No abdominal pain.  No nausea, no vomiting.  No diarrhea.  No constipation. Genitourinary: Negative for dysuria. Musculoskeletal: Negative for back pain. Skin: Negative for rash. Neurological: Negative for headaches, focal weakness or numbness.  Positive for vertigo  ____________________________________________   PHYSICAL EXAM:  VITAL SIGNS: Vitals:   03/06/20 2000 03/06/20 2017  BP: (!) 158/98   Pulse: 82   Resp: 18 18  Temp:    SpO2: 94%       Constitutional: Alert and oriented. Well appearing and in no acute distress. Eyes: Conjunctivae are normal.  PERRL. EOMI. Head: Atraumatic. Nose: No congestion/rhinnorhea. Mouth/Throat: Mucous membranes are moist.  Oropharynx non-erythematous. Neck: No stridor. No cervical spine tenderness to palpation. Cardiovascular: Normal rate, regular rhythm. Grossly normal heart sounds.  Good peripheral circulation. Respiratory: Normal respiratory effort.  No retractions. Lungs CTAB. Gastrointestinal: Soft , nondistended, nontender to palpation. No abdominal bruits. No CVA tenderness. Musculoskeletal: No lower extremity tenderness nor edema.  No joint effusions. No signs of acute trauma. Neurologic:  Normal speech and language. No gross focal neurologic deficits are appreciated. No gait instability noted. Right-sided lateral nystagmus noted, otherwise normal exam. Cranial nerves II through XII intact 5/5 strength and sensation in all 4 extremities Skin:  Skin is warm, dry and intact. No rash noted. Psychiatric: Mood and affect are normal. Speech and behavior are normal.  ____________________________________________   LABS (all labs ordered are listed, but only abnormal results are displayed)  Labs Reviewed  CBC - Abnormal; Notable for the following components:      Result Value   WBC 10.7 (*)    All other components within normal limits  COMPREHENSIVE METABOLIC PANEL - Abnormal; Notable for the following  components:   Glucose, Bld 118 (*)    BUN 7 (*)    All other components within normal limits  TROPONIN I (HIGH SENSITIVITY)   ____________________________________________  12 Lead EKG  Sinus rhythm, rate of 86 bpm.  Normal axis and intervals.  No evidence of acute ischemia. ____________________________________________  RADIOLOGY  ED MD interpretation: CT head reviewed without evidence of acute cranial pathology.  Official radiology report(s): CT Head Wo Contrast  Result Date: 03/06/2020 CLINICAL DATA:  Dizziness EXAM: CT HEAD WITHOUT CONTRAST TECHNIQUE: Contiguous axial images were obtained from the base of the skull through the vertex without intravenous contrast. COMPARISON:  None. FINDINGS: Brain: No acute territorial infarction, hemorrhage, or intracranial mass. The ventricles are nonenlarged. Vascular: No hyperdense vessels.  No unexpected calcification Skull: Normal. Negative for fracture or focal lesion. Sinuses/Orbits: No acute finding. Other: None IMPRESSION: Negative non contrasted CT appearance of the brain. Electronically Signed   By: Donavan Foil M.D.   On: 03/06/2020 16:32    ____________________________________________   PROCEDURES and INTERVENTIONS  Procedure(s) performed (including Critical Care):  Procedures  Medications  meclizine (ANTIVERT) tablet 25 mg (25 mg Oral Given 03/06/20 1839)  gabapentin (NEURONTIN) capsule 1,200 mg (1,200 mg Oral Given 03/06/20 1948)    ____________________________________________   MDM / ED COURSE  74 year old woman presents to the ED with positional vertigo, most consistent with BPV, and amenable to outpatient management.  Normal vital signs on room air.  Exam is reassuring with only few beats of horizontal nystagmus noted to the right, otherwise unremarkable.  She is ambulatory, well-appearing without distress or neurovascular deficits.  Blood work without acute derangements.  CT head without acute intracranial pathology to  suggest [place] or CVA.  Provided patient single dose of meclizine with improving symptoms.  Patient denies dizziness at rest after this medication and reports minimal symptoms that resolved in a matter of seconds after she turns her head.  I see no indications for emergent MRI, and I discussed this with the patient who is agreeable to outpatient management and following up with her PCP.  We discussed return precautions for the ED and patient is medically stable discharge home.  Clinical Course as of Mar 07 2339  Fri Mar 06, 2020  1936 Reassessed.  Patient reports improving symptoms.  She reports that she felt a "twinge" of vertigo/dizziness when she turns  her head to the right a few months ago, but it has since quickly self resolved and she feels normal right now.  She sitting up in bed eating a sandwich.  We discussed outpatient management of vertigo and prescription for meclizine. Patient reports concern for her blood pressure being elevated.  She takes no antihypertensive medications.  She reports missing 2 doses of her 3 times daily gabapentin and she is concerned that this may be elevating her blood pressure.  She requests a dose before discharge.  I order this.   [DS]    Clinical Course User Index [DS] Vladimir Crofts, MD     ____________________________________________   FINAL CLINICAL IMPRESSION(S) / ED DIAGNOSES  Final diagnoses:  Dizziness  Vertigo  Benign paroxysmal positional vertigo of right ear     ED Discharge Orders         Ordered    meclizine (ANTIVERT) 25 MG tablet  3 times daily PRN        03/06/20 1942           Holiday Mcmenamin   Note:  This document was prepared using Dragon voice recognition software and may include unintentional dictation errors.   Vladimir Crofts, MD 03/06/20 [phone]

## 2020-03-06 NOTE — ED Notes (Signed)
First Nurse Note: Pt to ED via [facility] from home for dizziness. Hx/o vertigo. Pt episode of vomiting this morning and nausea. Pt is in NAD.

## 2020-03-06 NOTE — ED Notes (Addendum)
Pt stating no relief after medication administration. MD made aware.

## 2020-03-06 NOTE — Telephone Encounter (Signed)
I spoke with pt; pt said starting last night she became very dizzy where the room has been spinning around continuously. Pt said she has almost fell couple of times due to vertigo; pt has H/A, SOB with exertion but also SOB sitting. Pt said she can hardly make it to bathroom.  Pt is not having CP; pt cannot find BP cuff. Pt had vomiting while on phone. Pt is at home alone and asked me to call 911 to help her; I called 911 and I stayed on phone til EMS got there. Pt asked med to call her niece Zigmund Daniel at [phone] to let Zigmund Daniel know where pt was going; [facility] ED per pt; pt said Zigmund Daniel does not need to come to hospital she just wants her to be aware of where she is at.Zigmund Daniel notified as instructed by pt and appreciative. FYI to Dr Diona Browner.

## 2020-03-09 NOTE — Telephone Encounter (Signed)
Noted  

## 2020-03-10 ENCOUNTER — Ambulatory Visit: Payer: Medicare Other | Admitting: Neurology

## 2020-03-11 ENCOUNTER — Ambulatory Visit: Payer: Medicare Other

## 2020-03-27 ENCOUNTER — Other Ambulatory Visit: Payer: Self-pay

## 2020-03-27 ENCOUNTER — Encounter: Payer: Self-pay | Admitting: Family Medicine

## 2020-03-27 ENCOUNTER — Ambulatory Visit (INDEPENDENT_AMBULATORY_CARE_PROVIDER_SITE_OTHER): Payer: Medicare Other | Admitting: Family Medicine

## 2020-03-27 VITALS — BP 110/80 | HR 106 | Temp 98.4°F | Ht <= 58 in | Wt 165.8 lb

## 2020-03-27 DIAGNOSIS — E66812 Obesity, class 2: Secondary | ICD-10-CM

## 2020-03-27 DIAGNOSIS — Z23 Encounter for immunization: Secondary | ICD-10-CM

## 2020-03-27 DIAGNOSIS — R7303 Prediabetes: Secondary | ICD-10-CM | POA: Diagnosis not present

## 2020-03-27 DIAGNOSIS — E7800 Pure hypercholesterolemia, unspecified: Secondary | ICD-10-CM | POA: Diagnosis not present

## 2020-03-27 DIAGNOSIS — Z0000 Encounter for general adult medical examination without abnormal findings: Secondary | ICD-10-CM

## 2020-03-27 DIAGNOSIS — M810 Age-related osteoporosis without current pathological fracture: Secondary | ICD-10-CM

## 2020-03-27 DIAGNOSIS — Z6836 Body mass index (BMI) 36.0-36.9, adult: Secondary | ICD-10-CM

## 2020-03-27 DIAGNOSIS — S61412A Laceration without foreign body of left hand, initial encounter: Secondary | ICD-10-CM

## 2020-03-27 NOTE — Addendum Note (Signed)
Addended by: Carter Kitten on: 03/27/2020 03:30 PM   Modules accepted: Orders

## 2020-03-27 NOTE — Assessment & Plan Note (Addendum)
Not at goal and statin indicated given 10year risk calculation. She refuse statin at this point... will reassess in 3 months. If not at goal LDL < 100 or risk below 10%

## 2020-03-27 NOTE — Patient Instructions (Addendum)
Increase exercsie as able.  Work low carb, low sugar and low cholesterol diet.  Return for lab only OV for recheck of cholesterol.. if not at goal.. consider statin.  Get COVID and shingles vaccine at Pharmacy  Call Dr. Celesta Aver office to determine if due colonoscopy.  Set up mammogram in 04/2020

## 2020-03-27 NOTE — Progress Notes (Addendum)
CC: annual CPE  History of Present Illness: HPI The patient presents for annual complete physical and review of chronic health problems. He/She also has the following acute concerns today:  continued off and on vertgio... improving  I have personally reviewed the Medicare Annual Wellness questionnaire and have noted 1. The patient's medical and social history 2. Their use of alcohol, tobacco or illicit drugs 3. Their current medications and supplements 4. The patient's functional ability including ADL's, fall risks, home safety risks and hearing or visual             impairment. 5. Diet and physical activities 6. Evidence for depression or mood disorders 7.         Updated provider list Cognitive evaluation was performed and recorded on pt medicare questionnaire form. The patients weight, height, BMI and visual acuity have been recorded in the chart  I have made referrals, counseling and provided education to the patient based review of the above and I have provided the pt with a written personalized care plan for preventive services.   Documentation of this information was scanned into the electronic record under the media tab.   Advance directives and end of life planning reviewed in detail with patient and documented in EMR. Patient given handout on advance care directives if needed. HCPOA and living will updated if needed.   Hearing Screening   125Hz  250Hz  500Hz  1000Hz  2000Hz  3000Hz  4000Hz  6000Hz  8000Hz   Right ear:           Left ear:           Comments: Complete Hearing Exam 01/2020.  Will be getting fitted for hearing aides   Visual Acuity Screening   Right eye Left eye Both eyes  Without correction: 20/50 20/70 20/25   With correction:       Fall Risk  03/27/2020 08/13/2019 02/27/2019 01/14/2019 10/16/2018  Falls in the past year? 0 0 0 0 0  Number falls in past yr: - 0 - 0 0  Injury with Fall? - 0 - 0 0  Risk for fall due to : - - Medication side effect - -  Follow up - -  Falls evaluation completed;Falls prevention discussed Falls evaluation completed -      Clinical Support from 02/27/2019 in [place] [facility] Total Score 0     Elevated Cholesterol: Not at goal and statin indicated given 10year risk calculation. Lab Results  Component Value Date   CHOL 180 03/02/2020   HDL 49.90 03/02/2020   LDLCALC 110 (H) 03/02/2020   LDLDIRECT 102.0 02/20/2018   TRIG 103.0 03/02/2020   CHOLHDL 4 03/02/2020  Using medications without problems: Muscle aches:  Diet compliance:  Exercise: minimal.. starting  To work out at a facility Other complaints: The 10-year ASCVD risk score Mikey Bussing DC Brooke Bonito., et al., 2013) is: 19.9%   Values used to calculate the score:     Age: 74 years     Sex: Female     Is Non-Hispanic African American: No     Diabetic: Yes     Tobacco smoker: No     Systolic Blood Pressure: 151 mmHg     Is BP treated: No     HDL Cholesterol: 49.9 mg/dL     Total Cholesterol: 180 mg/dL   Prediabetes  Increased sugar intake lately Lab Results  Component Value Date   HGBA1C 6.4 03/02/2020   Seen in ER on 10/8 for vertigo.. neg imaging and labs.  Felt  BPPV... as it improves with dramamine temporarily.  HX OF BREAST CANCER Hx of DCIS of the right breast status post lumpectomy.  Completedtamoxifen x5 years Dr. Sondra Come radiation onc.  Dr. Wynonia Hazard oncologist.Released.. Missed last mammogram in 09/2016. Dr. Emmie Niemann in surgeon  Cut on right knuckle in last  Few week s with knife... needs td booster.  Post herpetic neuralgia:moderate control On gabapentin and nortriptiline. Followed by neurologist.  This visit occurred during the SARS-CoV-2 public health emergency.  Safety protocols were in place, including screening questions prior to the visit, additional usage of staff PPE, and extensive cleaning of exam room while observing appropriate contact time as indicated for disinfecting solutions.   COVID 19 screen:  No  recent travel or known exposure to COVID19 The patient denies respiratory symptoms of COVID 19 at this time. The importance of social distancing was discussed today.     Review of Systems  Constitutional: Negative for chills and fever.  HENT: Negative for congestion and ear pain.   Eyes: Negative for pain and redness.  Respiratory: Negative for cough and shortness of breath.   Cardiovascular: Negative for chest pain, palpitations and leg swelling.  Gastrointestinal: Negative for abdominal pain, blood in stool, constipation, diarrhea, nausea and vomiting.  Genitourinary: Negative for dysuria.  Musculoskeletal: Negative for falls and myalgias.  Skin: Negative for rash.  Neurological: Negative for dizziness.  Psychiatric/Behavioral: Negative for depression. The patient is not nervous/anxious.       Past Medical History:  Diagnosis Date  . Breast cancer ([place]) 12/2011  . Breast cancer ([place]) 12/2011   DCIS  . Complication of anesthesia    hard to wake up age 34-not since  . COPD (chronic obstructive pulmonary disease) ([place])   . Fibromyalgia   . GERD (gastroesophageal reflux disease)   . History of radiation therapy 02/29/12 -04/21/12   right breast, 5040 cGy in 28 fx, boost to total 6240 cGy  . Osteoporosis   . Personal history of colonic polyps-adenomas 05/25/2012  . Personal history of radiation therapy   . PHN (postherpetic neuralgia) 2000   Right side of face  . Pneumonia 12/19/2018    reports that she has never smoked. She has never used smokeless tobacco. She reports current alcohol use. She reports that she does not use drugs.   Current Outpatient Medications:  .  acetaminophen (TYLENOL) 500 MG tablet, Take 1,000 mg by mouth every 4 (four) hours as needed for mild pain, moderate pain or headache. , Disp: , Rfl:  .  ALPRAZolam (XANAX) 0.25 MG tablet, Take 1 tablet (0.25 mg total) by mouth at bedtime. Need APPT before refills if anxiety continuing., Disp: 30 tablet, Rfl: 0 .   aspirin EC 81 MG tablet, Take 81 mg by mouth daily. Swallow whole., Disp: , Rfl:  .  Cholecalciferol (VITAMIN D) 50 MCG (2000 UT) tablet, Take 2,000 Units by mouth daily., Disp: , Rfl:  .  gabapentin (NEURONTIN) 600 MG tablet, Take 2 tablets three times a day, Disp: 540 tablet, Rfl: 3 .  meclizine (ANTIVERT) 25 MG tablet, Take 1 tablet (25 mg total) by mouth 3 (three) times daily as needed for dizziness., Disp: 30 tablet, Rfl: 1 .  nortriptyline (PAMELOR) 50 MG capsule, Take 3 capsules every night, Disp: 270 capsule, Rfl: 3 .  vitamin B-12 (CYANOCOBALAMIN) 1000 MCG tablet, Take 1,000 mcg by mouth daily., Disp: , Rfl:    Observations/Objective: Pulse (!) 106, temperature 98.4 F (36.9 C), temperature source Temporal, height 4' 9.68" (1.465 m), weight  165 lb 12 oz (75.2 kg), SpO2 96 %. BP Readings from Last 3 Encounters:  03/27/20 110/80  03/06/20 (!) 158/98  01/01/20 (!) 130/100     Physical Exam Constitutional:      General: She is not in acute distress.    Appearance: Normal appearance. She is well-developed. She is obese. She is not ill-appearing or toxic-appearing.  HENT:     Head: Normocephalic.     Right Ear: Hearing, tympanic membrane, ear canal and external ear normal.     Left Ear: Hearing, tympanic membrane, ear canal and external ear normal.     Nose: Nose normal.  Eyes:     General: Lids are normal. Lids are everted, no foreign bodies appreciated.     Conjunctiva/sclera: Conjunctivae normal.     Pupils: Pupils are equal, round, and reactive to light.  Neck:     Thyroid: No thyroid mass or thyromegaly.     Vascular: No carotid bruit.     Trachea: Trachea normal.  Cardiovascular:     Rate and Rhythm: Normal rate and regular rhythm.     Heart sounds: Normal heart sounds, S1 normal and S2 normal. No murmur heard.  No gallop.   Pulmonary:     Effort: Pulmonary effort is normal. No respiratory distress.     Breath sounds: Normal breath sounds. No wheezing, rhonchi or  rales.  Abdominal:     General: Bowel sounds are normal. There is no distension or abdominal bruit.     Palpations: Abdomen is soft. There is no fluid wave or mass.     Tenderness: There is no abdominal tenderness. There is no guarding or rebound.     Hernia: No hernia is present.  Musculoskeletal:     Cervical back: Normal range of motion and neck supple.  Lymphadenopathy:     Cervical: No cervical adenopathy.  Skin:    General: Skin is warm and dry.     Findings: No rash.  Neurological:     Mental Status: She is alert.     Cranial Nerves: No cranial nerve deficit.     Sensory: No sensory deficit.  Psychiatric:        Mood and Affect: Mood is not anxious or depressed.        Speech: Speech normal.        Behavior: Behavior normal. Behavior is cooperative.        Judgment: Judgment normal.      Assessment and Plan The patient's preventative maintenance and recommended screening tests for an annual wellness exam were reviewed in full today. Brought up to date unless services declined.  Counselled on the importance of diet, exercise, and its role in overall health and mortality. The patient's FH and SH was reviewed, including their home life, tobacco status, and drug and alcohol status.   Vaccines: Uptodate with pneumovax and prevnar,, consider shingles. Given  HD flu  And Td shot today.  04/2012 Dr. Carlean Purl, colonoscopy 5 adenomas max 6 mm Repeat colon 04/2017 .. due DVE/PAP:not indicated Mammo: Breast cancers/p Lumpectomy on right, radiation,followed by Dr. Lindi Adie, last mammo 04/2019.. due. DEXA:05/2019;worsened osteoporosis, wason tamoxifen(off NOV 2018), not currently on treatment.. refuses med to treat... wishes to increase activity.Marland Kitchen re-eval in 2 years. Non smoker, sig second hand some. Hearing loss in right ear. Dr. Warren Danes. Hep C: neg   Prediabetes  Not considering this DM.Marland Kitchen as remaining in prediabetic range with diet control. Encouraged exercise, weight loss,  healthy eating habits.   High cholesterol  Not at goal and statin indicated given 10year risk calculation. She refuse statin at this point... will reassess in 3 months. If not at goal LDL < 100 or risk below 10%  Class 2 severe obesity due to excess calories with serious comorbidity and body mass index (BMI) of 36.0 to 36.9 in adult [facility]) Encouraged exercise, weight loss, healthy eating habits.   Osteoporosis Refuses med to treat.. will try weight bearing exercise, ca and vit D.     Eliezer Lofts, MD

## 2020-03-27 NOTE — Assessment & Plan Note (Addendum)
Not considering this DM.Marland Kitchen as remaining in prediabetic range with diet control. Encouraged exercise, weight loss, healthy eating habits.

## 2020-03-27 NOTE — Assessment & Plan Note (Signed)
Encouraged exercise, weight loss, healthy eating habits. ? ?

## 2020-03-27 NOTE — Assessment & Plan Note (Signed)
Refuses med to treat.. will try weight bearing exercise, ca and vit D.

## 2020-03-31 NOTE — Telephone Encounter (Signed)
Pt had appt on 01/01/20

## 2020-04-08 ENCOUNTER — Other Ambulatory Visit: Payer: Self-pay

## 2020-04-08 ENCOUNTER — Ambulatory Visit: Payer: Medicare Other

## 2020-04-08 DIAGNOSIS — R7303 Prediabetes: Secondary | ICD-10-CM

## 2020-04-08 DIAGNOSIS — E7800 Pure hypercholesterolemia, unspecified: Secondary | ICD-10-CM

## 2020-04-08 NOTE — Chronic Care Management (AMB) (Signed)
Chronic Care Management Pharmacy  Name: Katie Zimmerman  MRN: [id number] DOB: September 11, 1945  Chief Complaint/ HPI  Katie Zimmerman,  74 y.o. , female presents for their Initial CCM visit with the clinical pharmacist via telephone.  PCP : Katie Sanders, MD  Their chronic conditions include: COPD, GERD, Osteoporosis, pre-DM, HLD, post-herpetic neuralgia, anxiety, obesity   Office Visits:  03/27/20: [facility]/PCP - DM diet controlled; High cholesterol, not at goal, refuses statin; Obesity - encouraged weight loss; Osteoporosis - declines medical treatment  Consult Visit:  03/06/20: ED - dizziness, given meclizine   08/13/19: Neurology - Right-sided post-herpetic neuralgia that continues to have exacerbation on high doses of gabapentin $RemoveBefor'1200mg'BKIxGPcwLACT$  TID and nortriptyline $RemoveBeforeDEI'150mg'ubFeTrmwiKXzMaqT$  qhs. We discussed that we could add on Duloxetine, however since she is overall responding to prn Tramadol, agreed to continue course for now. She will talk to her daughter about surgical options presented by Neurosurgery. Follow-up in 6 months, she knows to call for any changes.  Patient concerns: denies concerns  CCM consent: 10/25/19  Allergies  Allergen Reactions  . Penicillins Anaphylaxis, Rash and Other (See Comments)    Has patient had a PCN reaction causing immediate rash, facial/tongue/throat swelling, SOB or lightheadedness with hypotension: Yes Has patient had a PCN reaction causing severe rash involving mucus membranes or skin necrosis: No Has patient had a PCN reaction that required hospitalization No Has patient had a PCN reaction occurring within the last 10 years: No If all of the above answers are "NO", then may proceed with Cephalosporin use.  . Venlafaxine Nausea Only  . Sulfonamide Derivatives Rash   Medications: Outpatient Encounter Medications as of 04/08/2020  Medication Sig  . acetaminophen (TYLENOL) 500 MG tablet Take 1,000 mg by mouth every 4 (four) hours as needed for mild pain, moderate pain or  headache.   . ALPRAZolam (XANAX) 0.25 MG tablet Take 1 tablet (0.25 mg total) by mouth at bedtime. Need APPT before refills if anxiety continuing.  Marland Kitchen aspirin EC 81 MG tablet Take 81 mg by mouth daily. Swallow whole.  . Cholecalciferol (VITAMIN D) 50 MCG (2000 UT) tablet Take 2,000 Units by mouth daily.  Marland Kitchen gabapentin (NEURONTIN) 600 MG tablet Take 2 tablets three times a day  . meclizine (ANTIVERT) 25 MG tablet Take 1 tablet (25 mg total) by mouth 3 (three) times daily as needed for dizziness.  . nortriptyline (PAMELOR) 50 MG capsule Take 3 capsules every night  . vitamin B-12 (CYANOCOBALAMIN) 1000 MCG tablet Take 1,000 mcg by mouth daily.   No facility-administered encounter medications on file as of 04/08/2020.   Current Diagnosis/Assessment:  SDOH Interventions     Most Recent Value  SDOH Interventions  Financial Strain Interventions Intervention Not Indicated     Goals Addressed            This Visit's Progress   . Pharmacy Care Plan       CARE PLAN ENTRY  Current Barriers:  . Chronic Disease Management support, education, and care coordination needs related to Hyperlipidemia and Diabetes   Hyperlipidemia Lab Results  Component Value Date/Time   LDLCALC 110 (H) 03/02/2020 12:09 PM   LDLDIRECT 102.0 02/20/2018 09:03 AM .  Pharmacist Clinical Goal(s): o Over the next 3 months, patient will work with PharmD and providers to achieve LDL goal < 100 . Current regimen:  o No pharmacotherapy . Interventions: o Reviewed dietary and exercise goals . Patient self care activities - Over the next 3 months, patient will: . Slowly increase  exercise with goal of 30 minutes, 5 days per week . Incorporate a healthy diet high in vegetables, fruits and whole grains with low-fat dairy products, chicken, fish, legumes, non-tropical vegetable oils and nuts. Limit intake of sweets, sugar-sweetened beverages and red meats.  Pre-Diabetes Lab Results  Component Value Date/Time   HGBA1C 6.4  03/02/2020 12:09 PM   HGBA1C 6.0 (A) 09/03/2019 12:30 PM   HGBA1C 6.9 (H) 02/28/2019 09:16 AM .  Pharmacist Clinical Goal(s): o Over the next 3 months, patient will work with PharmD and providers to maintain A1c goal <7% . Current regimen:  o No pharmacotherapy . Interventions: o Discussed dietary and exercise goals . Patient self care activities - Over the next 3 months, patient will: . Slowly increase exercise with goal of 30 minutes, 5 days per week . Incorporate a healthy diet high in vegetables, fruits and whole grains with low-fat dairy products, chicken, fish, legumes, non-tropical vegetable oils and nuts. Limit intake of sweets, sugar-sweetened beverages and red meats. o Contact provider with any episodes of hypoglycemia  Initial goal documentation      Pre-Diabetes   Recent Relevant Labs: Lab Results  Component Value Date/Time   HGBA1C 6.4 03/02/2020 12:09 PM   HGBA1C 6.0 (A) 09/03/2019 12:30 PM   HGBA1C 6.9 (H) 02/28/2019 09:16 AM   MICROALBUR 9.7 (H) 09/03/2019 12:33 PM    Patient has failed these meds in past: none  Patient is currently controlled on the following medications:   No pharmacotherapy  Lab Results  Component Value Date/Time   HMDIABEYEEXA No Retinopathy 10/19/2018 12:00 AM    Lab Results  Component Value Date/Time   HMDIABFOOTEX done 09/02/2019 12:00 AM    We discussed: diet and exercise  Exercise: OsteoStrong   Plan: Continue current medications  . Hyperlipidemia   LDL goal < 100  Last lipids Lab Results  Component Value Date   CHOL 180 03/02/2020   HDL 49.90 03/02/2020   LDLCALC 110 (H) 03/02/2020   LDLDIRECT 102.0 02/20/2018   TRIG 103.0 03/02/2020   CHOLHDL 4 03/02/2020   Hepatic Function Latest Ref Rng & Units 03/06/2020 03/02/2020 01/01/2020  Total Protein 6.5 - 8.1 g/dL 8.1 7.6 8.2  Albumin 3.5 - 5.0 g/dL 4.1 4.1 4.4  AST 15 - 41 U/L 34 20 23  ALT 0 - 44 U/L $Remo'22 18 22  'GVzZb$ Alk Phosphatase 38 - 126 U/L 107 102 111  Total  Bilirubin 0.3 - 1.2 mg/dL 0.9 0.6 0.4  Bilirubin, Direct 0.0 - 0.3 mg/dL - - 0.0    The 10-year ASCVD risk score Mikey Bussing DC Jr., et al., 2013) is: 19.9%   Values used to calculate the score:     Age: 50 years     Sex: Female     Is Non-Hispanic African American: No     Diabetic: Yes     Tobacco smoker: No     Systolic Blood Pressure: 638 mmHg     Is BP treated: No     HDL Cholesterol: 49.9 mg/dL     Total Cholesterol: 180 mg/dL   Patient has failed these meds in past: none reported Patient is currently uncontrolled on the following medications:   No pharmacotherapy  We discussed:  diet and exercise extensively; do not recommend statin due to age   Plan: Continue current medications  Osteoporosis   DEXA 2020 worsened T-2.7 Per PCP visit - 03/27/2020 --> 05/2019 - worsened osteoporosis, was on tamoxifen(off NOV 2018), not currently on treatment.Marland Kitchen declines med to treat.Marland KitchenMarland Kitchen  wishes to increase activity.Marland Kitchen re-eval in 2 years. Non smoker, sig second hand some  VITD  Date Value Ref Range Status  02/20/2018 61.06 30.00 - 100.00 ng/mL Final    Patient is a candidate for pharmacologic treatment due to T-Score < -2.5 in lumbar spine  Patient has failed these meds in past: none Patient is currently controlled on the following medications:  Marland Kitchen Vitamin D3 2000 units - 1 tablet daily  We discussed:  Recommend [phone] units of vitamin D daily. Recommend 1200 mg of calcium daily from dietary and supplemental sources. Reports diet high in calcium. Denies taking calcium supplement. Denies falls.  Plan: Continue current medications  Post-Herpetic Neuralgia    Shingles right side of face Followed by neurologist Delice Lesch) Patient has failed these meds in past: none reported Patient is currently controlled on the following medications:   Gabapentin 600 mg - 2 tablets TID  Nortriptyline 50 mg - 3 capsules daily at bedtime  We discussed: denies concerns, working well   Plan: Continue current  medications  Anxiety    Patient has failed these meds in past: none reported Patient is currently controlled on the following medications:   No pharmacotherapy  We discussed: Xanax last filled 11/12/19 #30, no longer taking   Plan: Continue current medications   COPD/Chronic Bronchitis    Dr. Annamaria Boots - pulmonologist  Patient has tried these meds in past: Trelegy - ran out refills about a year ago and missed appt with pulmonology Patient is currently uncontrolled on the following medications:   Albuterol inhaler - 1 puff Q6H PRN  Uses albuterol several days per week when going outside We discussed: reports bad cough without Trelegy, hasnt contacted Dr. Annamaria Boots yet to reschedule since husband passing  Plan: Continue current medications; Recommend scheduling f/u with pulmonology.  Medication Management  Misc: meclizine - ED visit for vertigo 02/2020  OTCs: Tylenol, aspirin 81 mg, vitamin B12   Pharmacy/Benefits: [facility]   Adherence: denies missed doses  Social support: church/family  Affordability: denies concerns   CCM Follow Up: 6 months, telephone  [name], PharmD Clinical Pharmacist [facility] at [facility] [phone]

## 2020-04-08 NOTE — Patient Instructions (Signed)
April 08, 2020  Dear Katie Zimmerman,  It was a pleasure meeting you during our initial appointment on April 08, 2020. Below is a summary of the goals we discussed and components of chronic care management. Please contact me anytime with questions or concerns.   Visit Information  Goals Addressed            This Visit's Progress    Pharmacy Care Plan       CARE PLAN ENTRY  Current Barriers:   Chronic Disease Management support, education, and care coordination needs related to Hyperlipidemia and Diabetes   Hyperlipidemia Lab Results  Component Value Date/Time   LDLCALC 110 (H) 03/02/2020 12:09 PM   LDLDIRECT 102.0 02/20/2018 09:03 AM   Pharmacist Clinical Goal(s): o Over the next 3 months, patient will work with PharmD and providers to achieve LDL goal < 100  Current regimen:  o No pharmacotherapy  Interventions: o Reviewed dietary and exercise goals  Patient self care activities - Over the next 3 months, patient will:  Slowly increase exercise with goal of 30 minutes, 5 days per week  Incorporate a healthy diet high in vegetables, fruits and whole grains with low-fat dairy products, chicken, fish, legumes, non-tropical vegetable oils and nuts. Limit intake of sweets, sugar-sweetened beverages and red meats.  Pre-Diabetes Lab Results  Component Value Date/Time   HGBA1C 6.4 03/02/2020 12:09 PM   HGBA1C 6.0 (A) 09/03/2019 12:30 PM   HGBA1C 6.9 (H) 02/28/2019 09:16 AM   Pharmacist Clinical Goal(s): o Over the next 3 months, patient will work with PharmD and providers to maintain A1c goal <7%  Current regimen:  o No pharmacotherapy  Interventions: o Discussed dietary and exercise goals  Patient self care activities - Over the next 3 months, patient will:  Slowly increase exercise with goal of 30 minutes, 5 days per week  Incorporate a healthy diet high in vegetables, fruits and whole grains with low-fat dairy products, chicken, fish, legumes, non-tropical  vegetable oils and nuts. Limit intake of sweets, sugar-sweetened beverages and red meats. o Contact provider with any episodes of hypoglycemia  Initial goal documentation       Katie Zimmerman was given information about Chronic Care Management services today including:  1. CCM service includes personalized support from designated clinical staff supervised by her physician, including individualized plan of care and coordination with other care providers 2. 24/7 contact phone numbers for assistance for urgent and routine care needs. 3. Standard insurance, coinsurance, copays and deductibles apply for chronic care management only during months in which we provide at least 20 minutes of these services. Most insurances cover these services at 100%, however patients may be responsible for any copay, coinsurance and/or deductible if applicable. This service may help you avoid the need for more expensive face-to-face services. 4. Only one practitioner may furnish and bill the service in a calendar month. 5. The patient may stop CCM services at any time (effective at the end of the month) by phone call to the office staff.  Patient agreed to services and verbal consent obtained.   Patient verbalizes understanding of instructions provided today.  Telephone follow up appointment with pharmacy team member scheduled for: 6 months   [name], PharmD Clinical Pharmacist [facility] at [facility] [phone]   Basics of Medicine Management Taking your medicines correctly is an important part of managing or preventing medical problems. Make sure you know what disease or condition your medicine is treating, and how and when to  take it. If you do not take your medicine correctly, it may not work well and may cause unpleasant side effects, including serious health problems. What should I do when I am taking medicines?   Read all the labels and inserts that come with your medicines. Review the  information often.  Talk with your pharmacist if you get a refill and notice a change in the size, color, or shape of your medicines.  Know the potential side effects for each medicine that you take.  Try to get all your medicines from the same pharmacy. The pharmacist will have all your information and will understand how your medicines will affect each other (interact).  Tell your health care provider about all your medicines, including over-the-counter medicines, vitamins, and herbal or dietary supplements. He or she will make sure that nothing will interact with any of your prescribed medicines. How can I take my medicines safely?  Take medicines only as told by your health care provider. ? Do not take more of your medicine than instructed. ? Do not take anyone else's medicines. ? Do not share your medicines with others. ? Do not stop taking your medicines unless your health care provider tells you to do so. ? You may need to avoid alcohol or certain foods or liquids when taking certain medicines. Follow your health care provider's instructions.  Do not split, mash, or chew your medicines unless your health care provider tells you to do so. Tell your health care provider if you have trouble swallowing your medicines.  For liquid medicine, use the dosing container that was provided. How should I organize my medicines?  Know your medicines  Know what each of your medicines looks like. This includes size, color, and shape. Tell your health care provider if you are having trouble recognizing all the medicines that you are taking.  If you cannot tell your medicines apart because they look similar, keep them in original bottles.  If you cannot read the labels on the bottles, tell your pharmacist to put your medicines in containers with large print.  Review your medicines and your schedule with family members, a friend, or a caregiver. Use a pill organizer  Use a tool to organize your  medicine schedule. Tools include a weekly pillbox, a written chart, a notebook, or a calendar.  Your tool should help you remember the following things about each medicine: ? The name of the medicine. ? The amount (dose) to take. ? The schedule. This is the day and time the medicine should be taken. ? The appearance. This includes color, shape, size, and stamp. ? How to take your medicines. This includes instructions to take them with food, without food, with fluids, or with other medicines.  Create reminders for taking your medicines. Use sticky notes, or alarms on your watch, mobile device, or phone calendar.  You may choose to use a more advanced management system. These systems have storage, alarms, and visual and audio prompts.  Some medicines can be taken on an "as-needed" basis. These include medicines for nausea or pain. If you take an as-needed medicine, write down the name and dose, as well as the date and time that you took it. How should I plan for travel?  Take your pillbox, medicines, and organization system with you when traveling.  Have your medicines refilled before you travel. This will ensure that you do not run out of your medicines while you are away from home.  Always carry an updated  list of your medicines with you. If there is an emergency, a first responder can quickly see what medicines you are taking.  Do not pack your medicines in checked luggage in case your luggage is lost or delayed.  If any of your medicines is considered a controlled substance, make sure you bring a letter from your health care provider with you. How should I store and discard my medicines? For safe storage:  Store medicines in a cool, dry area away from light, or as directed by your health care provider. Do not store medicines in the bathroom. Heat and humidity will affect them.  Do not store your medicines with other chemicals, or with medicines for pets or other household  members.  Keep medicines away from children and pets. Do not leave them on counters or bedside tables. Store them in high cabinets or on high shelves. For safe disposal:  Check expiration dates regularly. Do not take expired medicines. Discard medicines that are older than the expiration date.  Learn a safe way to dispose of your medicines. You may: ? Use a local government, hospital, or pharmacy medicine-take-back program. ? Mix the medicines with inedible substances, put them in a sealed bag or empty container, and throw them in the trash. What should I remember?  Tell your health care provider if you: ? Experience side effects. ? Have new symptoms. ? Have other concerns about taking your medicines.  Review your medicines regularly with your health care provider. Other medicines, diet, medical conditions, weight changes, and daily habits can all affect how medicines work. Ask if you need to continue taking each medicine, and discuss how well each one is working.  Refill your medicines early to avoid running out of them.  In case of an accidental overdose, call your local [place] at [phone] or visit your local emergency department immediately. This is important. Summary  Taking your medicines correctly is an important part of managing or preventing medical problems.  You need to make sure that you understand what you are taking a medicine for, as well as how and when you need to take it.  Know your medicines and use a pill organizer to help you take your medicines correctly.  In case of an accidental overdose, call your local [place] at [phone] or visit your local emergency department immediately. This is important. This information is not intended to replace advice given to you by your health care provider. Make sure you discuss any questions you have with your health care provider. Document Revised: 05/11/2017 Document Reviewed:  05/11/2017 Elsevier Patient Education  2020 [employer].

## 2020-05-06 ENCOUNTER — Telehealth: Payer: Self-pay | Admitting: Neurology

## 2020-05-06 NOTE — Telephone Encounter (Signed)
No answer at 1031

## 2020-05-06 NOTE — Telephone Encounter (Signed)
No answer at 1045

## 2020-05-06 NOTE — Telephone Encounter (Signed)
Patient is not eating solid foods, just soft foods. Pains in the facial area into nose, comes and goes. Pain level now is at a 8. Is currently taken gabapentin 600mg  two tablets tid. AT hs takes amtripytline 50mg  3 tabs at hs. I advised patient that Dr. Delice Lesch wasn't here, that I would route to the pool and get with her. If pain worsen advised ED for evaluation and treatment.

## 2020-05-06 NOTE — Telephone Encounter (Signed)
Notes reviewed.  Pt no-showed visit in October. Last note from March reviewed.  She is on maximal therapy with gabapentin and high dose nortriptyline.  Adding trial of Cymbalta 30mg  is an option but given her refractory pain, she probably needs to see pain management.

## 2020-05-06 NOTE — Telephone Encounter (Signed)
Patient called into the after hours line stating she is unable to eat due to a nerve in her face. She would like some advice.

## 2020-05-07 NOTE — Telephone Encounter (Signed)
Patient is going to decide and let us know and call back to place the referral.

## 2020-05-26 ENCOUNTER — Telehealth: Payer: Self-pay | Admitting: Neurology

## 2020-05-26 NOTE — Telephone Encounter (Signed)
Error

## 2020-05-27 ENCOUNTER — Telehealth: Payer: Self-pay | Admitting: *Deleted

## 2020-05-27 NOTE — Telephone Encounter (Signed)
Received Teams message from [facility] in regards to a email she received that stated the following: Hey I got a call from patient relations, can you help me call your patient and give them and answer the following question: Patient Katie Zimmerman (MRN [id number]) reached out to our office yesterday afternoon with questions about COVID vaccines/testing. Would you please have a nurse give her a call today to follow-up? Her contact number is [phone]. Thank you so much! Have a great day! I am assuming she wants general info? Can you field it and see what her questions are?  Left message for [name] to return my call.

## 2020-05-27 NOTE — Telephone Encounter (Signed)
Left another message for Ms. Katie Zimmerman to call me back so I can assist her in any way answering questions she may have in regards to Covid Vaccine or Testing.

## 2020-05-28 NOTE — Telephone Encounter (Signed)
Spoke with Ms. Bucks.  She states she was told that since it had been more that six months since her 2nd Covid vaccine that she would need to be tested for Covid prior to getting her booster vaccine.  I advised that if she is not sick she can get her booster shot.  No need to be tested prior. She also stated that her insurance will give them a $25 gift card if they have their physical.  She spoke with someone at her insurance company because she never received her her gift card.  She states she was told it was not coded right for a physical and that is why she didn't get her gift card.  Because it says Medicare Wellness and not Annual Physical they will not give her the gift card.  I told her I would forward that information to Dr. [name] to see if there was anything we can do on our end.  She also ask me if Dr. [name] had left [facility].  I advised her that Dr. [name] was still here but was on vacation this week.  She said patient relations had told her that Dr. [name] was now at [facility] location.  I assured her Dr. [name] was still here at [facility].

## 2020-06-02 ENCOUNTER — Other Ambulatory Visit: Payer: Self-pay | Admitting: Family Medicine

## 2020-06-02 DIAGNOSIS — Z1231 Encounter for screening mammogram for malignant neoplasm of breast: Secondary | ICD-10-CM

## 2020-06-02 NOTE — Telephone Encounter (Signed)
Please let patient know I have added a CPX charge to her annual medicare wellness visit... hopefully this will get her her gift card.  Do we need to resubmit it?

## 2020-06-05 ENCOUNTER — Telehealth: Payer: Self-pay

## 2020-06-05 NOTE — Progress Notes (Signed)
I changes it but why did I need to do that if I am charging Medicare wellness and CPE?

## 2020-06-05 NOTE — Chronic Care Management (AMB) (Addendum)
Chronic Care Management Pharmacy Assistant   Name: Katie Zimmerman  MRN: [id number] DOB: February 16, 1946  Reason for Encounter: Disease State  Patient Questions:  1.  Have you seen any other providers since your last visit? No  2.  Any changes in your medicines or health? No  PCP : [name], MD  Allergies:   Allergies  Allergen Reactions   Penicillins Anaphylaxis, Rash and Other (See Comments)    Has patient had a PCN reaction causing immediate rash, facial/tongue/throat swelling, SOB or lightheadedness with hypotension: Yes Has patient had a PCN reaction causing severe rash involving mucus membranes or skin necrosis: No Has patient had a PCN reaction that required hospitalization No Has patient had a PCN reaction occurring within the last 10 years: No If all of the above answers are "NO", then may proceed with Cephalosporin use.   Venlafaxine Nausea Only   Sulfonamide Derivatives Rash    Medications: Outpatient Encounter Medications as of 06/05/2020  Medication Sig   acetaminophen (TYLENOL) 500 MG tablet Take 1,000 mg by mouth every 4 (four) hours as needed for mild pain, moderate pain or headache.    ALPRAZolam (XANAX) 0.25 MG tablet Take 1 tablet (0.25 mg total) by mouth at bedtime. Need APPT before refills if anxiety continuing. (Patient not taking: Reported on 04/08/2020)   aspirin EC 81 MG tablet Take 81 mg by mouth daily. Swallow whole.   Cholecalciferol (VITAMIN D) 50 MCG (2000 UT) tablet Take 2,000 Units by mouth daily.   gabapentin (NEURONTIN) 600 MG tablet Take 2 tablets three times a day   meclizine (ANTIVERT) 25 MG tablet Take 1 tablet (25 mg total) by mouth 3 (three) times daily as needed for dizziness. (Patient not taking: Reported on 04/08/2020)   nortriptyline (PAMELOR) 50 MG capsule Take 3 capsules every night   vitamin B-12 (CYANOCOBALAMIN) 1000 MCG tablet Take 1,000 mcg by mouth daily.   No facility-administered encounter medications on file as of 06/05/2020.     Current Diagnosis: Patient Active Problem List   Diagnosis Date Noted   Purpura ([place]) 11/12/2019   Situational anxiety 11/12/2019   Grieving 09/03/2019   Elevated blood pressure reading without diagnosis of hypertension 04/06/2019   Leukocytosis 01/01/2019   Cough, persistent 10/17/2017   Headache 02/21/2017   COPD with acute exacerbation ([place]) 12/30/2016   Class 2 severe obesity due to excess calories with serious comorbidity and body mass index (BMI) of 36.0 to 36.9 in adult ([place]) 08/09/2016   Increased endometrial stripe thickness 03/18/2016   CMC arthritis 01/22/2015   Counseling regarding end of life decision making 01/22/2015   Osteoporosis 01/28/2014   Hearing loss in right ear 01/20/2014   Personal history of colonic polyps-adenomas 05/25/2012   GERD (gastroesophageal reflux disease)    History of radiation therapy    Cancer of right breast, stage 0 01/26/2012   Prediabetes 11/22/2011   High cholesterol 11/22/2011   TEMPOROMANDIBULAR JOINT PAIN 11/04/2009   RENAL CALCULUS 05/04/2009   Post herpetic neuralgia 04/14/2009   Recent Relevant Labs: Lab Results  Component Value Date/Time   HGBA1C 6.4 03/02/2020 12:09 PM   HGBA1C 6.0 (A) 09/03/2019 12:30 PM   HGBA1C 6.9 (H) 02/28/2019 09:16 AM   MICROALBUR 9.7 (H) 09/03/2019 12:33 PM    Kidney Function Lab Results  Component Value Date/Time   CREATININE 0.87 03/06/2020 03:55 PM   CREATININE 1.02 03/02/2020 12:09 PM   CREATININE 0.9 02/20/2014 11:33 AM   CREATININE 0.8 02/15/2013 11:29 AM  GFR 52.95 (L) 03/02/2020 12:09 PM   GFRNONAA >60 03/06/2020 03:55 PM   GFRAA 58 (L) 01/16/2019 11:20 AM    Current antihyperglycemic regimen:  No pharmacotherapy  What recent interventions/DTPs have been made to improve glycemic control:  No interventions/DTP's  Have there been any recent hospitalizations or ED visits since last visit with CPP? No   Patient denies hypoglycemic symptoms, including Pale, Sweaty, Shaky,  Hungry, Nervous/irritable and Vision changes   Patient denies hyperglycemic symptoms, including blurry vision, excessive thirst, fatigue, polyuria and weakness    How often are you checking your blood sugar? Not checking  What are your blood sugars ranging?  Fasting: N/A Before meals: N/A After meals: N/A Bedtime: N/A  During the week, how often does your blood glucose drop below 70? Patient does not check her blood sugar.    Are you checking your feet daily/regularly? Yes. States feet look okay.   Adherence Review: Is the patient currently on a STATIN medication? No Is the patient currently on ACE/ARB medication? No Does the patient have >5 day gap between last estimated fill dates? CPP to review.     Lipid Panel    Component Value Date/Time   CHOL 180 03/02/2020 1209   TRIG 103.0 03/02/2020 1209   HDL 49.90 03/02/2020 1209   LDLCALC 110 (H) 03/02/2020 1209   LDLDIRECT 102.0 02/20/2018 0903    10-year ASCVD risk score: The 10-year ASCVD risk score Denman George DC Jr., et al., 2013) is: 19.9%   Values used to calculate the score:     Age: 75 years     Sex: Female     Is Non-Hispanic African American: No     Diabetic: Yes     Tobacco smoker: No     Systolic Blood Pressure: 110 mmHg     Is BP treated: No     HDL Cholesterol: 49.9 mg/dL     Total Cholesterol: 180 mg/dL  Current antihyperlipidemic regimen:  No pharmacotherapy  Previous antihyperlipidemic medications tried: None  ASCVD risk enhancing conditions: age >19, DM and HTN   What recent interventions/DTPs have been made by any provider to improve Cholesterol control since last CPP Visit: No changes have been made.   Any recent hospitalizations or ED visits since last visit with CPP? No   What diet changes have been made to improve Cholesterol?  No changes. States she eats fruits, meats, and vegetables.   What exercise is being done to improve Cholesterol?  States her insurance company sent her something in the  mail for a place to work out. She states she can not recall the name but she enjoyed it and is thinking about joining.    Reviewed chart prior to disease state call. Spoke with patient regarding BP  Recent Office Vitals: BP Readings from Last 3 Encounters:  03/27/20 110/80  03/06/20 (!) 158/98  01/01/20 (!) 130/100   Pulse Readings from Last 3 Encounters:  03/27/20 (!) 106  03/06/20 82  01/01/20 80    Wt Readings from Last 3 Encounters:  03/27/20 165 lb 12 oz (75.2 kg)  03/06/20 160 lb (72.6 kg)  01/01/20 168 lb 4 oz (76.3 kg)     Kidney Function Lab Results  Component Value Date/Time   CREATININE 0.87 03/06/2020 03:55 PM   CREATININE 1.02 03/02/2020 12:09 PM   CREATININE 0.9 02/20/2014 11:33 AM   CREATININE 0.8 02/15/2013 11:29 AM   GFR 52.95 (L) 03/02/2020 12:09 PM   GFRNONAA >60 03/06/2020 03:55 PM  GFRAA 58 (L) 01/16/2019 11:20 AM    BMP Latest Ref Rng & Units 03/06/2020 03/02/2020 01/01/2020  Glucose 70 - 99 mg/dL 118(H) 102(H) -  BUN 8 - 23 mg/dL 7(L) 12 -  Creatinine 0.44 - 1.00 mg/dL 0.87 1.02 1.00  Sodium 135 - 145 mmol/L 140 141 -  Potassium 3.5 - 5.1 mmol/L 3.8 3.7 -  Chloride 98 - 111 mmol/L 106 105 -  CO2 22 - 32 mmol/L 22 28 -  Calcium 8.9 - 10.3 mg/dL 9.5 9.2 -    Current antihypertensive regimen:  No pharmacotherapy  How often are you checking your Blood Pressure? infrequently   Current home BP readings:  05/28/20 128/86  What recent interventions/DTPs have been made by any provider to improve Blood Pressure control since last CPP Visit: No changes have been made.  Any recent hospitalizations or ED visits since last visit with CPP? No   What diet changes have been made to improve Blood Pressure Control?   No changes. States she eats fruits, meats, and vegetables.    Adherence Review: Is the patient currently on ACE/ARB medication? No  Inquired about checking her blood sugar, patient states she is not diabetic and has been told that there is  no such thing as pre-diabetic. Explained that we just wanted to see if she was checking. She states that she gave away all of her equipment to her son in law. She states she is active around her house but is looking to join some sort type of gym. She states the last year has been very difficult for her as her husband passed away 04-23-19.  She states she has a new blood pressure monitor that she is going to start using and document her readings.   Follow-Up:  Pharmacist Review   [name], CPP notified  [name], [place] Assistant [phone]  I have reviewed the care management and care coordination activities outlined in this encounter and I am certifying that I agree with the content of this note. No further action required.  [name], PharmD Clinical Pharmacist [facility] [phone]

## 2020-06-29 ENCOUNTER — Encounter: Payer: Self-pay | Admitting: Neurology

## 2020-06-29 ENCOUNTER — Other Ambulatory Visit: Payer: Self-pay

## 2020-06-29 ENCOUNTER — Ambulatory Visit (INDEPENDENT_AMBULATORY_CARE_PROVIDER_SITE_OTHER): Payer: Medicare Other | Admitting: Neurology

## 2020-06-29 VITALS — BP 149/92 | HR 107 | Ht <= 58 in | Wt 170.8 lb

## 2020-06-29 DIAGNOSIS — B0229 Other postherpetic nervous system involvement: Secondary | ICD-10-CM | POA: Diagnosis not present

## 2020-06-29 MED ORDER — DULOXETINE HCL 30 MG PO CPEP: ORAL_CAPSULE | ORAL | 11 refills | Status: DC

## 2020-06-29 MED ORDER — TRAMADOL HCL 50 MG PO TABS
ORAL_TABLET | ORAL | 5 refills | Status: DC
Start: 2020-06-29 — End: 2020-06-30

## 2020-06-29 MED ORDER — GABAPENTIN 600 MG PO TABS
ORAL_TABLET | ORAL | 3 refills | Status: DC
Start: 2020-06-29 — End: 2021-03-02

## 2020-06-29 MED ORDER — NORTRIPTYLINE HCL 50 MG PO CAPS
ORAL_CAPSULE | ORAL | 3 refills | Status: DC
Start: 2020-06-29 — End: 2021-03-02

## 2020-06-29 NOTE — Progress Notes (Signed)
NEUROLOGY FOLLOW UP OFFICE NOTE  [name] [id number] 1945-10-21  HISTORY OF PRESENT ILLNESS: I had the pleasure of seeing Katie Zimmerman in follow-up in the neurology clinic on 06/29/2020.  The patient was last seen 10 months ago for postherpetic neuralgia. MRI brain in 01/2018 showed poorly demonstrated cisternal segment of trigeminal nerve, differential diagnosis includes anomalous course, edema, developmental variant, or old injury, no abnormal enhancement. She was seen by Neurosurgery for possible surgical options but she decided to hold off. She is on high doses of gabapentin 1200mg  TID and nortriptyline 150mg  qhs. She has Tramadol 50mg  prn for flares, she ran out of Tramadol, she was taking 1/2 to 1 tab as needed. She called last month to report more pain, unable to eat solid foods. She reports that she has pain all the time, waxing and waning in intensity. She has no pain-free days. It is 8/10 today, and she states that this has quieted down since yesterday where she had more pain in the roof of her mouth on the right radiating to her tongue, making it hard to eat. She denies any weight loss.    History on Initial Assessment 01/15/2018: This is a pleasant 75 year old right-handed woman with a history of breast cancer, presenting for postherpetic neuralgia. She had shingles affecting the right side of her face in 2000. She did well for a year or 2 with no symptoms then started having significant pain in the same distribution. She describes pain as pressure and tenderness when she washes or touches her cheek on the right side of her nose. It is painful to talk, with pain radiating from the right temporal region down the right side of her tongue. It is hard to eat and talk. Bending down seems to worsen pain. She does have a diagnosis of TMJ on the right as well. She was tried on different medications in the past few years and found gabapentin helped for a while, then stopped working. She tried Lyrica  several years ago which also helped for a little while, then she saw a neurologist who increased to the dose to 200mg  but she never filled the higher dose because she developed kidney stones in 2011. Around that time, the pain was not bothering her so much, but when pain recurred, she was restarted on gabapentin which worked pretty good until recently. For the past few months, she has had a constant 10/10 pain (appears comfortable in the office today), when she has sharp pains, the pain would be more than a 10. Pain is worse at night, really bad around 9pm. She takes her gabapentin and can sleep. The last few days she has increased gabapentin 300mg  taking 3 in AM, 2 at supper, 3 at bedtime. No drowsiness. She denies any tinnitus but feels she has lost some hearing in the right ear. No vision changes. For a time she was having headaches with pressure over the vertex, but none lately. No nausea/vomiting, dizziness, diplopia, dysarthria/dysphagia, neck pain, focal numbness/tingling/weakness in the extremities, bowel/bladder dysfunction. Left side of face is unaffected. She has chronic back pain.     PAST MEDICAL HISTORY: Past Medical History:  Diagnosis Date  . Breast cancer ([place]) 12/2011  . Breast cancer ([place]) 12/2011   DCIS  . Complication of anesthesia    hard to wake up age 81-not since  . COPD (chronic obstructive pulmonary disease) ([place])   . Fibromyalgia   . GERD (gastroesophageal reflux disease)   . History of radiation  therapy 02/29/12 -04/21/12   right breast, 5040 cGy in 28 fx, boost to total 6240 cGy  . Osteoporosis   . Personal history of colonic polyps-adenomas 05/25/2012  . Personal history of radiation therapy   . PHN (postherpetic neuralgia) 2000   Right side of face  . Pneumonia 12/19/2018    MEDICATIONS: Current Outpatient Medications on File Prior to Visit  Medication Sig Dispense Refill  . acetaminophen (TYLENOL) 500 MG tablet Take 1,000 mg by mouth every 4 (four) hours as  needed for mild pain, moderate pain or headache.     . ALPRAZolam (XANAX) 0.25 MG tablet Take 1 tablet (0.25 mg total) by mouth at bedtime. Need APPT before refills if anxiety continuing. 30 tablet 0  . Cholecalciferol (VITAMIN D) 50 MCG (2000 UT) tablet Take 2,000 Units by mouth daily.    Marland Kitchen gabapentin (NEURONTIN) 600 MG tablet Take 2 tablets three times a day 540 tablet 3  . Multiple Vitamins-Minerals (GNP HAIR/SKIN/NAILS PO) Take by mouth daily.    . nortriptyline (PAMELOR) 50 MG capsule Take 3 capsules every night 270 capsule 3  . vitamin B-12 (CYANOCOBALAMIN) 1000 MCG tablet Take 1,000 mcg by mouth daily.     No current facility-administered medications on file prior to visit.    ALLERGIES: Allergies  Allergen Reactions  . Penicillins Anaphylaxis, Rash and Other (See Comments)    Has patient had a PCN reaction causing immediate rash, facial/tongue/throat swelling, SOB or lightheadedness with hypotension: Yes Has patient had a PCN reaction causing severe rash involving mucus membranes or skin necrosis: No Has patient had a PCN reaction that required hospitalization No Has patient had a PCN reaction occurring within the last 10 years: No If all of the above answers are "NO", then may proceed with Cephalosporin use.  . Venlafaxine Nausea Only  . Sulfonamide Derivatives Rash    FAMILY HISTORY: Family History  Problem Relation Age of Onset  . Hypertension Mother   . Cancer Mother        LIVER cancer  . Hypertension Father   . Diabetes Father   . Heart failure Father   . Breast cancer Sister 47       NOT hormone receptor breast cancer  . Breast cancer Maternal Aunt 75  . Breast cancer Other        dx in her 50s  . Breast cancer Cousin 42       maternal cousin  . Breast cancer Cousin        maternal cousin  . Breast cancer Cousin        maternal cousin  . Cancer Cousin        unknown form of cancer; maternal cousin  . Cancer Cousin        unknown form of cancer; maternal  cousin  . Cancer Cousin 11       ? lung cancer; maternal cousin  . Breast cancer Cousin        dx 32s-50s; paternal cousin  . Breast cancer Cousin        dx in her 44s; paternal cousin  . Colon cancer Neg Hx   . Stomach cancer Neg Hx     SOCIAL HISTORY: Social History   Socioeconomic History  . Marital status: Widowed    Spouse name: Not on file  . Number of children: 2  . Years of education: Not on file  . Highest education level: Not on file  Occupational History  . Occupation: CONSULTANT  Employer: [employer]    Comment: Retired  Tobacco Use  . Smoking status: Never Smoker  . Smokeless tobacco: Never Used  Vaping Use  . Vaping Use: Never used  Substance and Sexual Activity  . Alcohol use: Yes    Comment: occasional glass of wine  . Drug use: No  . Sexual activity: Not Currently  Other Topics Concern  . 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 Difficulty of Paying Living Expenses: Not very hard  Food Insecurity: Not on file  Transportation Needs: Not on file  Physical Activity: Not on file  Stress: Not on file  Social Connections: Not on file  Intimate Partner Violence: Not on file     PHYSICAL EXAM: Vitals:   06/29/20 1351  BP: (!) 149/92  Pulse: (!) 107  SpO2: 96%   General: No acute distress Head:  Normocephalic/atraumatic Skin/Extremities: No rash, no edema Neurological Exam: alert and awake. No aphasia or dysarthria. Fund of knowledge is appropriate.  Recent and remote memory are intact.  Attention and concentration are normal.   Cranial nerves: Pupils equal, round. Extraocular movements intact with no nystagmus. Visual  fields full.  Intact sensation to light touch on face, reports pain when applying pressure on the right cheek. No facial asymmetry.  Motor: Bulk and tone normal, muscle strength 5/5 throughout with no pronator drift.   Finger to nose testing intact.  Gait narrow-based and steady, able to tandem walk adequately.    IMPRESSION: This is a pleasant 75 yo RH woman with a history of breast cancer with right-sided post-herpetic neuralgia that continues to have exacerbation on high doses of gabapentin 1200mg  TID and nortriptyline 150mg  qhs. She is scared of surgical options but continues to have significant daily pain and will contact her neurosurgeon Dr. Zada Finders again. We discussed referral to Pain Management, however she would like to try adding on Cymbalta 30mg  qhs first, side effects discussed. She has prn Tramadol for severe pain. Follow-up in 6-8 months, she knows to call for any changes.   Thank you for allowing me to participate in her care.  Please do not hesitate to call for any questions or concerns.   Ellouise Newer, M.D.   CC: Dr. Diona Browner

## 2020-06-29 NOTE — Patient Instructions (Addendum)
1. Start Cymbalta 30mg : Take 1 capsule every night  2. Continue all your other medications  3. As needed Tramadol has been sent to your pharmacy as well  4. Contact Dr. Zada Finders at  [phone] to discuss surgical options  5. Follow-up in 6-8 months, call for any changes

## 2020-06-30 ENCOUNTER — Telehealth: Payer: Self-pay | Admitting: Neurology

## 2020-06-30 MED ORDER — TRAMADOL HCL 50 MG PO TABS
ORAL_TABLET | ORAL | 5 refills | Status: DC
Start: 2020-06-30 — End: 2020-08-12

## 2020-06-30 NOTE — Telephone Encounter (Signed)
I verified that the claim was resubmitted to the insurance after changes were made. It was resubmitted on 1/27. I called and let patient know. She verbalized understanding.

## 2020-06-30 NOTE — Telephone Encounter (Signed)
Rx sent 

## 2020-07-08 NOTE — Progress Notes (Deleted)
-HPI  female never smoker(prolonged second hand smoke) followed for chronic bronchitis, complicated by right breast cancer/XRT, GERD Office Spirometry 10/16/2017-moderate restriction of exhaled volume without obvious obstruction.  FVC 1.57/68%, FEV1 1.27/73%, ratio 0.81, FEF 25-75% 1.37/88% Echocardiogram EF 52-77, grade 1 diastolic dysfunction PFT 01/21/2352-IRWE restriction, minimal diffusion defect.  FVC 1.72/71%, FEV1 1.53/85%, ratio 0.89, TLC 69%, DLCO 74% Labs 11/17/2017-IgE 241 H, Eos  0.5, BNP 69 FENO 06/20/2018-- 11 WNL --------------------------------------------------------------    06/21/2018 - 75 year old female never smoker(prolonged second hand smoke) followed for chronic bronchitis, complicated by right breast cancer/XRT, GERD -----COPD: Pt has cough-productive at times-thick and yellow in color.  Albuterol HFA, Trelegy, Cough productive yellow sputum routinely. Denies fever, blood, acute change. Acute bronchitis in September treated by PCP.  Notices shortness of breath only with coughing.  Denies night sweats.  Occasional mild wheeze. Not aware of reflux now but it was a significant problem years ago. Labs 11/17/2017-IgE 241 H, Eos  0.5, BNP 69 FENO 06/20/2018-- 11 WNL  07/09/20- 75 year old female never smoker(prolonged second hand smoke) followed for chronic bronchitis, complicated by right breast cancer/XRT, GERD Albuterol HFA, Trelegy, Covid vax- Flu vax-    ROS-see HPI  + = positive Constitutional:   No-   weight loss, night sweats, fevers, chills, fatigue, lassitude. HEENT:   No-  headaches, difficulty swallowing, tooth/dental problems, sore throat,       No-  sneezing, itching, ear ache, nasal congestion, post nasal drip,  CV:  No-   chest pain, orthopnea, PND, swelling in lower extremities, anasarca,                                     dizziness, palpitations Resp: +shortness of breath with exertion or at rest.             +productive cough,  + non-productive  cough,  No- coughing up of blood.            +change in color of mucus.   +wheezing.   Skin: No-   rash or lesions. GI:  No-   heartburn, indigestion, abdominal pain, nausea, vomiting, diarrhea,                 change in bowel habits, loss of appetite GU:  MS:  No-   joint pain or swelling.   Neuro-     nothing unusual Psych:  No- change in mood or affect. No depression or anxiety.  No memory loss.  OBJ- Physical Exam General- Alert, Oriented, Affect-appropriate, Distress- none acute, + obese Skin- rash-none, lesions- none, excoriation- none Lymphadenopathy- none Head- atraumatic            Eyes- Gross vision intact, PERRLA, conjunctivae and secretions clear            Ears- Hearing, canals-normal            Nose- Clear, no-Septal dev, mucus, polyps, erosion, perforation             Throat- Mallampati III , mucosa clear , drainage- none, tonsils- atrophic Neck- flexible , trachea midline, no stridor , thyroid nl, carotid no bruit Chest - symmetrical excursion , unlabored           Heart/CV- RRR , no murmur , no gallop  , no rub, nl s1 s2                           -  JVD- none , edema- none, stasis changes- none, varices- none           Lung- + clear, wheeze-none, cough+raspy  , dullness-none, rub- none           Chest wall-  +Hx R lumpectomy/XRT Abd-  Br/ Gen/ Rectal- Not done, not indicated Extrem- cyanosis- none, clubbing, none, atrophy- none, strength- nl Neuro- grossly intact to observation

## 2020-07-09 ENCOUNTER — Ambulatory Visit: Payer: Medicare Other | Admitting: Internal Medicine

## 2020-07-28 ENCOUNTER — Other Ambulatory Visit: Payer: Self-pay | Admitting: Family Medicine

## 2020-07-28 ENCOUNTER — Ambulatory Visit
Admission: RE | Admit: 2020-07-28 | Discharge: 2020-07-28 | Disposition: A | Discharge status: Home / Self Care | Payer: Medicare Other | Source: Ambulatory Visit | Attending: Family Medicine | Admitting: Family Medicine

## 2020-07-28 ENCOUNTER — Other Ambulatory Visit: Payer: Self-pay

## 2020-07-28 DIAGNOSIS — Z1231 Encounter for screening mammogram for malignant neoplasm of breast: Secondary | ICD-10-CM

## 2020-07-28 DIAGNOSIS — N6452 Nipple discharge: Secondary | ICD-10-CM

## 2020-07-28 DIAGNOSIS — N644 Mastodynia: Secondary | ICD-10-CM

## 2020-08-11 ENCOUNTER — Other Ambulatory Visit: Payer: Self-pay | Admitting: Neurology

## 2020-08-21 DIAGNOSIS — R519 Headache, unspecified: Secondary | ICD-10-CM | POA: Diagnosis not present

## 2020-08-21 DIAGNOSIS — R58 Hemorrhage, not elsewhere classified: Secondary | ICD-10-CM | POA: Diagnosis not present

## 2020-08-21 DIAGNOSIS — S0101XA Laceration without foreign body of scalp, initial encounter: Secondary | ICD-10-CM | POA: Diagnosis not present

## 2020-08-21 DIAGNOSIS — M810 Age-related osteoporosis without current pathological fracture: Secondary | ICD-10-CM | POA: Diagnosis not present

## 2020-08-21 DIAGNOSIS — R0602 Shortness of breath: Secondary | ICD-10-CM | POA: Diagnosis not present

## 2020-08-21 DIAGNOSIS — M542 Cervicalgia: Secondary | ICD-10-CM | POA: Diagnosis not present

## 2020-08-21 DIAGNOSIS — M5030 Other cervical disc degeneration, unspecified cervical region: Secondary | ICD-10-CM | POA: Diagnosis not present

## 2020-08-21 DIAGNOSIS — R102 Pelvic and perineal pain: Secondary | ICD-10-CM | POA: Diagnosis not present

## 2020-08-21 DIAGNOSIS — S199XXA Unspecified injury of neck, initial encounter: Secondary | ICD-10-CM | POA: Diagnosis not present

## 2020-08-21 DIAGNOSIS — S3993XA Unspecified injury of pelvis, initial encounter: Secondary | ICD-10-CM | POA: Diagnosis not present

## 2020-08-21 DIAGNOSIS — M25562 Pain in left knee: Secondary | ICD-10-CM | POA: Diagnosis not present

## 2020-08-21 DIAGNOSIS — Z882 Allergy status to sulfonamides status: Secondary | ICD-10-CM | POA: Diagnosis not present

## 2020-08-21 DIAGNOSIS — S0990XA Unspecified injury of head, initial encounter: Secondary | ICD-10-CM | POA: Diagnosis not present

## 2020-08-21 DIAGNOSIS — Z880 Allergy status to penicillin: Secondary | ICD-10-CM | POA: Diagnosis not present

## 2020-08-21 DIAGNOSIS — M79622 Pain in left upper arm: Secondary | ICD-10-CM | POA: Diagnosis not present

## 2020-08-27 NOTE — Progress Notes (Signed)
-HPI  female never smoker(prolonged second hand smoke) followed for chronic bronchitis, complicated by right breast cancer/XRT, GERD, Postherpetic Neuralgia,  Office Spirometry 10/16/2017-moderate restriction of exhaled volume without obvious obstruction.  FVC 1.57/68%, FEV1 1.27/73%, ratio 0.81, FEF 25-75% 1.37/88% Echocardiogram EF 57-32, grade 1 diastolic dysfunction PFT 07/01/5425-CWCB restriction, minimal diffusion defect.  FVC 1.72/71%, FEV1 1.53/85%, ratio 0.89, TLC 69%, DLCO 74% Labs 11/17/2017-IgE 241 H, Eos  0.5, BNP 69 FENO 06/20/2018-- 11 WNL -------------------------------------------------------------- 06/21/2018 - 75 year old female never smoker(prolonged second hand smoke) followed for chronic bronchitis, complicated by right breast cancer/XRT, GERD -----COPD: Pt has cough-productive at times-thick and yellow in color.  Albuterol HFA, Trelegy, Cough productive yellow sputum routinely. Denies fever, blood, acute change. Acute bronchitis in September treated by PCP.  Notices shortness of breath only with coughing.  Denies night sweats.  Occasional mild wheeze. Not aware of reflux now but it was a significant problem years ago. Labs 11/17/2017-IgE 241 H, Eos  0.5, BNP 69 FENO 06/20/2018-- 11 WNL  08/28/20- 75 year old female never smoker(prolonged second hand smoke) followed for chronic bronchitis/ COPD, complicated by right Breast Cancer/XRT, GERD , Postherpetic Neuralgia, Obesity,  -no inhalers now Covid vax-3 Phizer Flu vax-had Recent falls > ED.Marland Kitchen -----Patient states that she has a productive cough with green sputum. Worse at night. Shortness of breath with exertion. She says breathing and cough are not significantly changed since  hosp in July 2020 with RLL pneumonia, COPD exacerbation and acute hypoxic resp failure. Using rescue inhaler 2-3x/ week and has been out of Trelegy which did help. Green sputum, no blood or fever, adenopathy or chest pain. Had fallen twice. Came today  wearing high heels which I discouraged given hx falls. Widowed, living with brother-in-law and his daughter.   ROS-see HPI  + = positive Constitutional:   No-   weight loss, night sweats, fevers, chills, fatigue, lassitude. HEENT:   No-  headaches, difficulty swallowing, tooth/dental problems, sore throat,       No-  sneezing, itching, ear ache, nasal congestion, post nasal drip,  CV:  No-   chest pain, orthopnea, PND, swelling in lower extremities, anasarca,                                     dizziness, palpitations Resp: +shortness of breath with exertion or at rest.             +productive cough,  + non-productive cough,  No- coughing up of blood.            +change in color of mucus.   +wheezing.   Skin: No-   rash or lesions. GI:  No-   heartburn, indigestion, abdominal pain, nausea, vomiting, diarrhea,                 change in bowel habits, loss of appetite GU:  MS:  No-   joint pain or swelling.   Neuro-    + Fals, + postherpetic neuralgia Psych:  No- change in mood or affect. No depression or anxiety.  No memory loss.  OBJ- Physical Exam General- Alert, Oriented, Affect-appropriate, Distress- none acute, + obese Skin- rash-none, lesions- none, excoriation- none Lymphadenopathy- none Head- atraumatic            Eyes- Gross vision intact, PERRLA, conjunctivae and secretions clear            Ears- Hearing, canals-normal  Nose- Clear, no-Septal dev, mucus, polyps, erosion, perforation             Throat- Mallampati III , mucosa clear , drainage- none, tonsils- atrophic Neck- flexible , trachea midline, no stridor , thyroid nl, carotid no bruit Chest - symmetrical excursion , unlabored           Heart/CV- RRR , no murmur , no gallop  , no rub, nl s1 s2                           - JVD- none , edema- none, stasis changes- none, varices- none           Lung- + clear, wheeze-none, cough+deep , dullness-none, rub- none           Chest wall-  +Hx R lumpectomy/XRT Abd-  Br/  Gen/ Rectal- Not done, not indicated Extrem- cyanosis- none, clubbing, none, atrophy- none, strength- nl Neuro- grossly intact to observation

## 2020-08-28 ENCOUNTER — Encounter: Payer: Self-pay | Admitting: Internal Medicine

## 2020-08-28 ENCOUNTER — Ambulatory Visit (INDEPENDENT_AMBULATORY_CARE_PROVIDER_SITE_OTHER): Payer: Medicare Other

## 2020-08-28 ENCOUNTER — Other Ambulatory Visit: Payer: Self-pay

## 2020-08-28 ENCOUNTER — Ambulatory Visit (INDEPENDENT_AMBULATORY_CARE_PROVIDER_SITE_OTHER): Payer: Medicare Other | Admitting: Internal Medicine

## 2020-08-28 VITALS — BP 140/90 | HR 87 | Temp 98.1°F | Ht <= 58 in | Wt 172.4 lb

## 2020-08-28 DIAGNOSIS — W19XXXA Unspecified fall, initial encounter: Secondary | ICD-10-CM | POA: Insufficient documentation

## 2020-08-28 DIAGNOSIS — J449 Chronic obstructive pulmonary disease, unspecified: Secondary | ICD-10-CM | POA: Diagnosis not present

## 2020-08-28 DIAGNOSIS — J441 Chronic obstructive pulmonary disease with (acute) exacerbation: Secondary | ICD-10-CM | POA: Diagnosis not present

## 2020-08-28 DIAGNOSIS — F4321 Adjustment disorder with depressed mood: Secondary | ICD-10-CM

## 2020-08-28 DIAGNOSIS — R0602 Shortness of breath: Secondary | ICD-10-CM | POA: Diagnosis not present

## 2020-08-28 MED ORDER — TRELEGY ELLIPTA 100-62.5-25 MCG/INH IN AEPB: INHALATION_SPRAY | RESPIRATORY_TRACT | 12 refills | Status: DC

## 2020-08-28 MED ORDER — DOXYCYCLINE HYCLATE 100 MG PO TABS: 100.0000 mg | ORAL_TABLET | Freq: Two times a day (BID) | ORAL | 0 refills | Status: DC

## 2020-08-28 MED ORDER — METHYLPREDNISOLONE ACETATE 80 MG/ML IJ SUSP: 80.0000 mg | Freq: Once | INTRAMUSCULAR | Status: DC

## 2020-08-28 MED ORDER — TRELEGY ELLIPTA 100-62.5-25 MCG/INH IN AEPB: 1.0000 | INHALATION_SPRAY | Freq: Every day | RESPIRATORY_TRACT | 0 refills | Status: DC

## 2020-08-28 MED ORDER — ALBUTEROL SULFATE HFA 108 (90 BASE) MCG/ACT IN AERS: 2.0000 | INHALATION_SPRAY | Freq: Four times a day (QID) | RESPIRATORY_TRACT | 12 refills | Status: DC | PRN

## 2020-08-28 NOTE — Assessment & Plan Note (Signed)
On arrival she began going into detailed account of circumstances around husband's cardiac death 2 years ago. I let her talk for 10 minutes, but had to gently redirect conversation to deal with her isues and our role today.

## 2020-08-28 NOTE — Patient Instructions (Signed)
Order- CXR dx COPD exacerbation  Order- depo 66    COPD exacerbation  Order sample x 2 Trelegy 100   Inhale 1 puff then rinse mouth, once daily  Scripts sent to your drug store for Trelegy and your ProAir albuterol rescue inhaler  Script sent for doxycycline antibiotic

## 2020-08-28 NOTE — Assessment & Plan Note (Signed)
2 recent falls, striking head, with 1 ED visit for laceration. Circumstances suggest she may have tripped. Plan- f/u with PCP and Neurology. Do not wear high-heeled shoes.

## 2020-08-28 NOTE — Assessment & Plan Note (Signed)
Clinically consistent with active bronchitis. Not sure her assessment that this is unchanged from 2 years ago is correct.  Plan- doxycycline, restart Trelegy, depomedrol today, CXR

## 2020-09-09 ENCOUNTER — Other Ambulatory Visit: Payer: Self-pay

## 2020-09-09 ENCOUNTER — Ambulatory Visit
Admission: RE | Admit: 2020-09-09 | Discharge: 2020-09-09 | Disposition: A | Discharge status: Home / Self Care | Payer: Medicare Other | Source: Ambulatory Visit | Attending: Family Medicine | Admitting: Family Medicine

## 2020-09-09 DIAGNOSIS — N6452 Nipple discharge: Secondary | ICD-10-CM

## 2020-09-09 DIAGNOSIS — N644 Mastodynia: Secondary | ICD-10-CM

## 2020-09-09 DIAGNOSIS — R922 Inconclusive mammogram: Secondary | ICD-10-CM | POA: Diagnosis not present

## 2020-09-09 DIAGNOSIS — Z853 Personal history of malignant neoplasm of breast: Secondary | ICD-10-CM | POA: Diagnosis not present

## 2020-09-09 DIAGNOSIS — R928 Other abnormal and inconclusive findings on diagnostic imaging of breast: Secondary | ICD-10-CM | POA: Diagnosis not present

## 2020-09-15 ENCOUNTER — Telehealth: Payer: Self-pay

## 2020-09-15 DIAGNOSIS — N6452 Nipple discharge: Secondary | ICD-10-CM

## 2020-09-15 NOTE — Telephone Encounter (Signed)
Patient called and LVM on triage line stating that the [facility] told her that our office was supposed to call her by Monday because Dr. Diona Browner is the one that is supposed to set up the MRI that needs to be done. Please advise.

## 2020-09-15 NOTE — Telephone Encounter (Signed)
Call breast center.. does MRI need to be with or without contrast.. do they have an img number?

## 2020-09-16 NOTE — Telephone Encounter (Signed)
Order was just placed today.   Will forward to Anastasiya to see if she can work an referral.

## 2020-09-16 NOTE — Telephone Encounter (Signed)
Spoke with [facility].  Should be ordered at MRI bilateral breast with and without contrast.  I have placed order below but questions on order will need to be completed and signed.

## 2020-09-16 NOTE — Addendum Note (Signed)
Addended byEliezer Lofts E on: 09/16/2020 10:09 AM   Modules accepted: Orders

## 2020-09-16 NOTE — Telephone Encounter (Signed)
Working on pre authorization now. Per epic patient just made an appointment with [facility] for 09/28/20.

## 2020-09-16 NOTE — Telephone Encounter (Signed)
No pre authorization needed. Notes made in the order. [facility] already spoke with patient.

## 2020-09-16 NOTE — Addendum Note (Signed)
Addended by: Carter Kitten on: 09/16/2020 09:04 AM   Modules accepted: Orders

## 2020-09-16 NOTE — Telephone Encounter (Signed)
Pt left v/m requesting cb on MRI appt status.

## 2020-09-28 ENCOUNTER — Ambulatory Visit
Admission: RE | Admit: 2020-09-28 | Discharge: 2020-09-28 | Disposition: A | Discharge status: Home / Self Care | Payer: Medicare Other | Source: Ambulatory Visit | Attending: Family Medicine | Admitting: Family Medicine

## 2020-09-28 ENCOUNTER — Other Ambulatory Visit: Payer: Self-pay

## 2020-09-28 DIAGNOSIS — Z853 Personal history of malignant neoplasm of breast: Secondary | ICD-10-CM | POA: Diagnosis not present

## 2020-09-28 DIAGNOSIS — N6452 Nipple discharge: Secondary | ICD-10-CM | POA: Diagnosis not present

## 2020-09-28 MED ORDER — GADOBUTROL 1 MMOL/ML IV SOLN
8.0000 mL | Freq: Once | INTRAVENOUS | Status: AC | PRN
  Administered 2020-09-28: 8 mL via INTRAVENOUS

## 2020-09-29 ENCOUNTER — Telehealth: Payer: Self-pay

## 2020-09-29 NOTE — Telephone Encounter (Signed)
error 

## 2020-09-30 ENCOUNTER — Other Ambulatory Visit: Payer: Self-pay

## 2020-09-30 ENCOUNTER — Encounter: Payer: Self-pay | Admitting: Family Medicine

## 2020-09-30 ENCOUNTER — Ambulatory Visit (INDEPENDENT_AMBULATORY_CARE_PROVIDER_SITE_OTHER): Payer: Medicare Other | Admitting: Family Medicine

## 2020-09-30 VITALS — BP 140/90 | HR 81 | Temp 97.9°F | Ht <= 58 in | Wt 166.5 lb

## 2020-09-30 DIAGNOSIS — N6452 Nipple discharge: Secondary | ICD-10-CM | POA: Diagnosis not present

## 2020-09-30 DIAGNOSIS — W19XXXD Unspecified fall, subsequent encounter: Secondary | ICD-10-CM

## 2020-09-30 DIAGNOSIS — S61412A Laceration without foreign body of left hand, initial encounter: Secondary | ICD-10-CM

## 2020-09-30 DIAGNOSIS — M25511 Pain in right shoulder: Secondary | ICD-10-CM | POA: Diagnosis not present

## 2020-09-30 HISTORY — DX: Laceration without foreign body of left hand, initial encounter: S61.412A

## 2020-09-30 HISTORY — DX: Nipple discharge: N64.52

## 2020-09-30 HISTORY — DX: Pain in right shoulder: M25.511

## 2020-09-30 NOTE — Assessment & Plan Note (Signed)
Healing. Can use ice prn as well as neosporin ointment. No current sign of bacteria infeciton.

## 2020-09-30 NOTE — Patient Instructions (Addendum)
If continued nipple discharge.. call for surgical referral and repeat MRI.  Start right shoulder stretching exercise.  Can use ice on forehead and topical antibiotic ointment if needed.

## 2020-09-30 NOTE — Assessment & Plan Note (Signed)
No nipple discharge in 2 weeks... MRI unremarkable.  pt with history of breast cancer in left. If nipple discharge recurs... will refer to surgery and may need 6 month follow up MRI.

## 2020-09-30 NOTE — Progress Notes (Signed)
Patient ID: Katie Zimmerman, female    DOB: 03/20/1946, 75 y.o.   MRN: [id number]  This visit was conducted in person.  Ht 4\' 10"  (1.473 m)   BMI 36.03 kg/m    CC:   Subjective:   HPI: Katie Zimmerman is a 75 y.o. female presenting on 09/30/2020 for    She was seen in ED on 08/21/20 following a fall .  Note and testing reviewed in detail. She reports she was walking down the street and tripped over a curb landing on her forehead as well as the left side of her body. States she had another mechanical fall  The day before as well that night fell again after tripping over her shoe. Denies any syncope or any antecedent symptoms prior to her fall... per ER note but today says she felt mild vertigo prior.   No loss of consciousness. Felt woozy afterward.  Head CT: no acute intracranial findings. CT cervical spine:  Degenerative changes.. no acute findings. CXR: neg X-ray pelvis: no fracture Left knee  And left humerus X-ray:  Negative   Forehead laceration 1 cm:  treated with glue.  Since then   Mild headache gradually getting better. No new new neurologic symptoms.   No discharge or redness in facial laceration.  She has been having some right shoulder pain.Marland Kitchen tramadol has helped with pain.  Mild pain in left shoulder when abduction shoulder.    She is on gabapentin and amitryptiline for postherpetic neuralgia.   Recent MRI breast for nipple discharge.. last noted nipple discharge 4/13.Marland Kitchen none since     Relevant past medical, surgical, family and social history reviewed and updated as indicated. Interim medical history since our last visit reviewed. Allergies and medications reviewed and updated. Outpatient Medications Prior to Visit  Medication Sig Dispense Refill  . acetaminophen (TYLENOL) 500 MG tablet Take 1,000 mg by mouth every 4 (four) hours as needed for mild pain, moderate pain or headache.     . albuterol (VENTOLIN HFA) 108 (90 Base) MCG/ACT inhaler Inhale 2 puffs into the  lungs every 6 (six) hours as needed for wheezing or shortness of breath. 18 g 12  . ALPRAZolam (XANAX) 0.25 MG tablet Take 1 tablet (0.25 mg total) by mouth at bedtime. Need APPT before refills if anxiety continuing. 30 tablet 0  . Cholecalciferol (VITAMIN D) 50 MCG (2000 UT) tablet Take 2,000 Units by mouth daily.    Marland Kitchen doxycycline (VIBRA-TABS) 100 MG tablet Take 1 tablet (100 mg total) by mouth 2 (two) times daily. 14 tablet 0  . DULoxetine (CYMBALTA) 30 MG capsule Take 1 tablet every night 30 capsule 11  . Fluticasone-Umeclidin-Vilant (TRELEGY ELLIPTA) 100-62.5-25 MCG/INH AEPB Inhale 1 puff then rinse mouth once daily 60 each 12  . Fluticasone-Umeclidin-Vilant (TRELEGY ELLIPTA) 100-62.5-25 MCG/INH AEPB Inhale 1 puff into the lungs daily. 28 each 0  . gabapentin (NEURONTIN) 600 MG tablet Take 2 tablets three times a day 540 tablet 3  . Multiple Vitamins-Minerals (GNP HAIR/SKIN/NAILS PO) Take by mouth daily.    . nortriptyline (PAMELOR) 50 MG capsule Take 3 capsules every night 270 capsule 3  . traMADol (ULTRAM) 50 MG tablet TAKE 1/2 TO 1 (ONE-HALF TO ONE) TABLET BY MOUTH ONCE DAILY AS NEEDED FOR SEVERE PAIN 30 tablet 5  . vitamin B-12 (CYANOCOBALAMIN) 1000 MCG tablet Take 1,000 mcg by mouth daily.     Facility-Administered Medications Prior to Visit  Medication Dose Route Frequency Provider Last Rate Last Admin  .  methylPREDNISolone acetate (DEPO-MEDROL) injection 80 mg  80 mg Intramuscular Once Baird Lyons D, MD         Per HPI unless specifically indicated in ROS section below Review of Systems  Constitutional: Negative for fatigue and fever.  HENT: Negative for congestion.   Eyes: Negative for pain.  Respiratory: Negative for cough and shortness of breath.   Cardiovascular: Negative for chest pain, palpitations and leg swelling.  Gastrointestinal: Negative for abdominal pain.  Genitourinary: Negative for dysuria and vaginal bleeding.  Musculoskeletal: Negative for back pain.   Neurological: Positive for headaches. Negative for syncope and light-headedness.  Psychiatric/Behavioral: Negative for dysphoric mood.   Objective:  Ht 4\' 10"  (1.473 m)   BMI 36.03 kg/m   Wt Readings from Last 3 Encounters:  08/28/20 172 lb 6.4 oz (78.2 kg)  06/29/20 170 lb 12.8 oz (77.5 kg)  03/27/20 165 lb 12 oz (75.2 kg)      Physical Exam Constitutional:      General: She is not in acute distress.    Appearance: Normal appearance. She is well-developed. She is not ill-appearing or toxic-appearing.  HENT:     Head: Normocephalic.     Right Ear: Hearing, tympanic membrane, ear canal and external ear normal. Tympanic membrane is not erythematous, retracted or bulging.     Left Ear: Hearing, tympanic membrane, ear canal and external ear normal. Tympanic membrane is not erythematous, retracted or bulging.     Nose: No mucosal edema or rhinorrhea.     Right Sinus: No maxillary sinus tenderness or frontal sinus tenderness.     Left Sinus: No maxillary sinus tenderness or frontal sinus tenderness.     Mouth/Throat:     Pharynx: Uvula midline.  Eyes:     General: Lids are normal. Lids are everted, no foreign bodies appreciated.     Conjunctiva/sclera: Conjunctivae normal.     Pupils: Pupils are equal, round, and reactive to light.  Neck:     Thyroid: No thyroid mass or thyromegaly.     Vascular: No carotid bruit.     Trachea: Trachea normal.  Cardiovascular:     Rate and Rhythm: Normal rate and regular rhythm.     Pulses: Normal pulses.     Heart sounds: Normal heart sounds, S1 normal and S2 normal. No murmur heard. No friction rub. No gallop.   Pulmonary:     Effort: Pulmonary effort is normal. No tachypnea or respiratory distress.     Breath sounds: Normal breath sounds. No decreased breath sounds, wheezing, rhonchi or rales.  Abdominal:     General: Bowel sounds are normal.     Palpations: Abdomen is soft.     Tenderness: There is no abdominal tenderness.   Musculoskeletal:     Right shoulder: Tenderness present. No bony tenderness. Decreased range of motion.     Cervical back: Normal range of motion and neck supple.     Comments: Pain with int and ext rotation and abduction.  Mildly positive neer's, neg drop arm test.  Skin:    General: Skin is warm and dry.     Findings: No rash.     Comments: Healing scar on central forehead between eyes.. no erythema, pt feels like it swollen but no swelling observed  Neurological:     Mental Status: She is alert and oriented to person, place, and time.     GCS: GCS eye subscore is 4. GCS verbal subscore is 5. GCS motor subscore is 6.  Cranial Nerves: No cranial nerve deficit.     Sensory: No sensory deficit.     Motor: No abnormal muscle tone.     Coordination: Coordination normal.     Gait: Gait normal.     Deep Tendon Reflexes: Reflexes are normal and symmetric.     Comments: Nml cerebellar exam   No papilledema  Psychiatric:        Mood and Affect: Mood is not anxious or depressed.        Speech: Speech normal.        Behavior: Behavior normal. Behavior is cooperative.        Thought Content: Thought content normal.        Cognition and Memory: Memory is not impaired. She does not exhibit impaired recent memory or impaired remote memory.        Judgment: Judgment normal.       Results for orders placed or performed during the hospital encounter of 03/06/20  CBC  Result Value Ref Range   WBC 10.7 (H) 4.0 - 10.5 K/uL   RBC 4.93 3.87 - 5.11 MIL/uL   Hemoglobin 14.5 12.0 - 15.0 g/dL   HCT 44.5 36.0 - 46.0 %   MCV 90.3 80.0 - 100.0 fL   MCH 29.4 26.0 - 34.0 pg   MCHC 32.6 30.0 - 36.0 g/dL   RDW 13.4 11.5 - 15.5 %   Platelets 253 150 - 400 K/uL   nRBC 0.0 0.0 - 0.2 %  Comprehensive metabolic panel  Result Value Ref Range   Sodium 140 135 - 145 mmol/L   Potassium 3.8 3.5 - 5.1 mmol/L   Chloride 106 98 - 111 mmol/L   CO2 22 22 - 32 mmol/L   Glucose, Bld 118 (H) 70 - 99 mg/dL   BUN  7 (L) 8 - 23 mg/dL   Creatinine, Ser 0.87 0.44 - 1.00 mg/dL   Calcium 9.5 8.9 - 10.3 mg/dL   Total Protein 8.1 6.5 - 8.1 g/dL   Albumin 4.1 3.5 - 5.0 g/dL   AST 34 15 - 41 U/L   ALT 22 0 - 44 U/L   Alkaline Phosphatase 107 38 - 126 U/L   Total Bilirubin 0.9 0.3 - 1.2 mg/dL   GFR, Estimated >60 >60 mL/min   Anion gap 12 5 - 15  Troponin I (High Sensitivity)  Result Value Ref Range   Troponin I (High Sensitivity) 8 <18 ng/L    This visit occurred during the SARS-CoV-2 public health emergency.  Safety protocols were in place, including screening questions prior to the visit, additional usage of staff PPE, and extensive cleaning of exam room while observing appropriate contact time as indicated for disinfecting solutions.   COVID 19 screen:  No recent travel or known exposure to COVID19 The patient denies respiratory symptoms of COVID 19 at this time. The importance of social distancing was discussed today.   Assessment and Plan Problem List Items Addressed This Visit    Acute pain of right shoulder - Primary   Fall    Likely due to peripheral neuropathy and older low grip shoes. Pt reports associated dizziness, none now.. could be medication SE.   resulted in mild, now resolving concussion      Laceration of left hand without foreign body    Healing. Can use ice prn as well as neosporin ointment. No current sign of bacteria infeciton.      Nipple discharge    No nipple discharge in 2 weeks... MRI  unremarkable.  pt with history of breast cancer in left. If nipple discharge recurs... will refer to surgery and may need 6 month follow up MRI.            Eliezer Lofts, MD

## 2020-09-30 NOTE — Assessment & Plan Note (Addendum)
Likely due to peripheral neuropathy and older low grip shoes. Pt reports associated dizziness, none now.. could be medication SE.   resulted in mild, now resolving concussion

## 2020-09-30 NOTE — Assessment & Plan Note (Signed)
Possible bursitis vs rotator cuff strain. Can use tylenol or tramadol for pain.  Start home PT. Info given.

## 2020-10-01 ENCOUNTER — Telehealth: Payer: Self-pay

## 2020-10-01 NOTE — Chronic Care Management (AMB) (Addendum)
    Chronic Care Management Pharmacy Assistant   Name: STEPHENIE NAVEJAS  MRN: [id number] DOB: [birth year]  Reason for Encounter: Reminder for CCM Appointment 10/06/20.   Medications: Outpatient Encounter Medications as of 10/01/2020  Medication Sig   acetaminophen (TYLENOL) 500 MG tablet Take 1,000 mg by mouth every 4 (four) hours as needed for mild pain, moderate pain or headache.    albuterol (VENTOLIN HFA) 108 (90 Base) MCG/ACT inhaler Inhale 2 puffs into the lungs every 6 (six) hours as needed for wheezing or shortness of breath.   ALPRAZolam (XANAX) 0.25 MG tablet Take 1 tablet (0.25 mg total) by mouth at bedtime. Need APPT before refills if anxiety continuing.   Cholecalciferol (VITAMIN D) 50 MCG (2000 UT) tablet Take 2,000 Units by mouth daily.   DULoxetine (CYMBALTA) 30 MG capsule Take 1 tablet every night   Fluticasone-Umeclidin-Vilant (TRELEGY ELLIPTA) 100-62.5-25 MCG/INH AEPB Inhale 1 puff then rinse mouth once daily   gabapentin (NEURONTIN) 600 MG tablet Take 2 tablets three times a day   Multiple Vitamins-Minerals (GNP HAIR/SKIN/NAILS PO) Take by mouth daily.   nortriptyline (PAMELOR) 50 MG capsule Take 3 capsules every night   traMADol (ULTRAM) 50 MG tablet TAKE 1/2 TO 1 (ONE-HALF TO ONE) TABLET BY MOUTH ONCE DAILY AS NEEDED FOR SEVERE PAIN   vitamin B-12 (CYANOCOBALAMIN) 1000 MCG tablet Take 1,000 mcg by mouth daily.   Facility-Administered Encounter Medications as of 10/01/2020  Medication   methylPREDNISolone acetate (DEPO-MEDROL) injection 80 mg   BRYNN MULGREW was contacted to remind her of her upcoming telephone visit with [name] on 10/06/20 at 8:30 AM. She was reminded to have all medications, supplements and any blood glucose and blood pressure readings available for review at appointment.   Are you having any problems with your medications? No issues with medications at this time.   What concerns would you like to discuss with the pharmacist? No concerns.    Star  Rating Drugs: Medication:  Last Fill: Day Supply None identified  [name], CPP notified  [name], [place] [phone]  I have reviewed the care management and care coordination activities outlined in this encounter and I am certifying that I agree with the content of this note. No further action required.  [name], PharmD Clinical Pharmacist [facility] [phone]

## 2020-10-06 ENCOUNTER — Ambulatory Visit (INDEPENDENT_AMBULATORY_CARE_PROVIDER_SITE_OTHER): Payer: Medicare Other

## 2020-10-06 ENCOUNTER — Other Ambulatory Visit: Payer: Self-pay

## 2020-10-06 ENCOUNTER — Telehealth: Payer: Self-pay

## 2020-10-06 DIAGNOSIS — M810 Age-related osteoporosis without current pathological fracture: Secondary | ICD-10-CM

## 2020-10-06 DIAGNOSIS — E7800 Pure hypercholesterolemia, unspecified: Secondary | ICD-10-CM

## 2020-10-06 DIAGNOSIS — J441 Chronic obstructive pulmonary disease with (acute) exacerbation: Secondary | ICD-10-CM | POA: Diagnosis not present

## 2020-10-06 DIAGNOSIS — R7303 Prediabetes: Secondary | ICD-10-CM

## 2020-10-06 NOTE — Patient Instructions (Signed)
Dear Katie Zimmerman,  Below is a summary of the goals we discussed during our follow up appointment on Oct 06, 2020. Please contact me anytime with questions or concerns.   Visit Information   Patient Care Plan: CCM Pharmacy Care Plan    Problem Identified: [facility] AMB "PATIENT-SPECIFIC PROBLEM"     Goal: Patient Stated     Long-Range Goal: Disease Management   Start Date: 10/06/2020  Priority: High  Note:    Current Barriers:  . None identified  Pharmacist Clinical Goal(s):  Marland Kitchen Patient will contact provider office for questions/concerns as evidenced notation of same in electronic health record through collaboration with PharmD and provider.   Interventions: . 1:1 collaboration with [name], MD regarding development and update of comprehensive plan of care as evidenced by provider attestation and co-signature . Inter-disciplinary care team collaboration (see longitudinal plan of care) . Comprehensive medication review performed; medication list updated in electronic medical record  Hyperlipidemia: (LDL goal < 100) -Not ideally controlled - LDL 110 -Current treatment: . None -Medications previously tried: none  -Educated on Importance of routine exercise  -Recommended increase exercise  Pre-diabetes (A1c goal <6.5%) -Controlled - A1c 6.4%  -Current medications: . None -Medications previously tried: none -Current home glucose readings - none  -Denies hypoglycemic/hyperglycemic symptoms -Current exercise: none currrently -Educated on A1c and blood sugar goals; -Counseled to check feet daily and get yearly eye exams - eye exam is due May 2022 -Recommended schedule diabetic eye exam   COPD (Goal: control symptoms and prevent exacerbations) -Controlled - patient reports doing well, less coughing and shortness of breath -Follows with pulmonolgy -Current treatment  . Rescue: Albuterol inhaler - 2 puffs every 6 hours . Maintenance: Trelegy 100-62.5-25 mcg - 1 puff daily   -Medications previously tried: none  -Exacerbations requiring treatment in last 6 months: yes - prior to starting Trelegy last month -Patient reports consistent use of maintenance inhaler -Frequency of rescue inhaler use: pt reports rare use, less than once weekly  She has not picked up Trelegy yet from pharmacy, finishing out sample this week.  -Counseled on Benefits of consistent maintenance inhaler use Differences between maintenance and rescue inhalers -Recommended to continue current medication Assessed patient finances. Apply for Trelegy assistance  Anxiety (Goal: Control symptoms) -Controlled - patient reports doing well off therapy -Current treatment: . None -Medications previously tried/failed:  Xanax - no longer needed per patient Husband passed last year. She reports doing pretty well. She participates in grief sharing at church every Saturday. -Recommended continue to engage in social support through her church  Osteoporosis (Goal: Prevent falls and fractures) -Last DEXA Scan: 2020  T-Score femoral neck: n/a  T-Score total hip: n/a  T-Score lumbar spine: -2.7  T-Score forearm radius: n/a -Patient is a candidate for pharmacologic treatment due to T-Score < -2.5 in lumbar spine; Patient prior declined medication therapy per PCP note -Current treatment  . Vitamin D3 2000 IU - 1 tablet daily  -Medications previously tried: none -Recommend [phone] units of vitamin D daily. Recommend 1200 mg of calcium daily from dietary and supplemental sources. Recommend weight-bearing and muscle strengthening exercises for building and maintaining bone density. -Recommended to continue current medication  Neuropathy (Goal: Control symptoms) -Followed by neurology -She reports much improvement since starting duloxetine -Current treatment  . Gabapentin 600 mg - 2 tablets three times per day . Nortriptyline 50 mg - 3 capsules every night . Duloxetine 30 mg - 1 capsule every  night . Tramadol 50 mg -  1/2 to 1 tablet daily as needed for severe pain . Vitamin B12 1000 mcg - 1 tablet daily -Medications previously tried: none reported -Uses as needed: Duloxetine and tramadol  -Recommended to continue current medication  Patient Goals/Self-Care Activities . Patient will:  - take medications as prescribed  -fill out paperwork for Trelegy assistance in office      The patient verbalized understanding of instructions, educational materials, and care plan provided today and agreed to receive a mailed copy of patient instructions, educational materials, and care plan.  Telephone follow up appointment with pharmacy team member scheduled for: 9 months (February 2023)  [name], PharmD Clinical Pharmacist [facility] [phone]   Basics of Medicine Management Taking your medicines correctly is an important part of managing or preventing medical problems. Make sure you know what disease or condition your medicine is treating, and how and when to take it. If you do not take your medicine correctly, it may not work well and may cause unpleasant side effects, including serious health problems. What should I do when I am taking medicines?  Read all the labels and inserts that come with your medicines. Review the information often.  Talk with your pharmacist if you get a refill and notice a change in the size, color, or shape of your medicines.  Know the potential side effects for each medicine that you take.  Try to get all your medicines from the same pharmacy. The pharmacist will have all your information and will understand how your medicines will affect each other (interact).  Tell your health care provider about all your medicines, including over-the-counter medicines, vitamins, and herbal or dietary supplements. He or she will make sure that nothing will interact with any of your prescribed medicines.   How can I take my medicines  safely?  Take medicines only as told by your health care provider. ? Do not take more of your medicine than instructed. ? Do not take anyone else's medicines. ? Do not share your medicines with others. ? Do not stop taking your medicines unless your health care provider tells you to do so. ? You may need to avoid alcohol or certain foods or liquids when taking certain medicines. Follow your health care provider's instructions.  Do not split, mash, or chew your medicines unless your health care provider tells you to do so. Tell your health care provider if you have trouble swallowing your medicines.  For liquid medicine, use the dosing container that was provided. How should I organize my medicines? Know your medicines  Know what each of your medicines looks like. This includes size, color, and shape. Tell your health care provider if you are having trouble recognizing all the medicines that you are taking.  If you cannot tell your medicines apart because they look similar, keep them in original bottles.  If you cannot read the labels on the bottles, tell your pharmacist to put your medicines in containers with large print.  Review your medicines and your schedule with family members, a friend, or a caregiver. Use a pill organizer  Use a tool to organize your medicine schedule. Tools include a weekly pillbox, a written chart, a notebook, or a calendar.  Your tool should help you remember the following things about each medicine: ? The name of the medicine. ? The amount (dose) to take. ? The schedule. This is the day and time the medicine should be taken. ? The appearance. This includes color, shape,  size, and stamp. ? How to take your medicines. This includes instructions to take them with food, without food, with fluids, or with other medicines.  Create reminders for taking your medicines. Use sticky notes, or alarms on your watch, mobile device, or phone calendar.  You may choose  to use a more advanced management system. These systems have storage, alarms, and visual and audio prompts.  Some medicines can be taken on an "as-needed" basis. These include medicines for nausea or pain. If you take an as-needed medicine, write down the name and dose, as well as the date and time that you took it.   How should I plan for travel?  Take your pillbox, medicines, and organization system with you when traveling.  Have your medicines refilled before you travel. This will ensure that you do not run out of your medicines while you are away from home.  Always carry an updated list of your medicines with you. If there is an emergency, a first responder can quickly see what medicines you are taking.  Do not pack your medicines in checked luggage in case your luggage is lost or delayed.  If any of your medicines is considered a controlled substance, make sure you bring a letter from your health care provider with you. How should I store and discard my medicines? For safe storage:  Store medicines in a cool, dry area away from light, or as directed by your health care provider. Do not store medicines in the bathroom. Heat and humidity will affect them.  Do not store your medicines with other chemicals, or with medicines for pets or other household members.  Keep medicines away from children and pets. Do not leave them on counters or bedside tables. Store them in high cabinets or on high shelves. For safe disposal:  Check expiration dates regularly. Do not take expired medicines. Discard medicines that are older than the expiration date.  Learn a safe way to dispose of your medicines. You may: ? Use a local government, hospital, or pharmacy medicine-take-back program. ? Mix the medicines with inedible substances, put them in a sealed bag or empty container, and throw them in the trash. What should I remember?  Tell your health care provider if you: ? Experience side  effects. ? Have new symptoms. ? Have other concerns about taking your medicines.  Review your medicines regularly with your health care provider. Other medicines, diet, medical conditions, weight changes, and daily habits can all affect how medicines work. Ask if you need to continue taking each medicine, and discuss how well each one is working.  Refill your medicines early to avoid running out of them.  In case of an accidental overdose, call your local [place] at [phone] or visit your local emergency department immediately. This is important. Summary  Taking your medicines correctly is an important part of managing or preventing medical problems.  You need to make sure that you understand what you are taking a medicine for, as well as how and when you need to take it.  Know your medicines and use a pill organizer to help you take your medicines correctly.  In case of an accidental overdose, call your local [place] at [phone] or visit your local emergency department immediately. This is important. This information is not intended to replace advice given to you by your health care provider. Make sure you discuss any questions you have with your health care provider. Document Revised: 05/11/2017 Document Reviewed:  05/11/2017 Elsevier Patient Education  2021 [employer].

## 2020-10-06 NOTE — Chronic Care Management (AMB) (Addendum)
    Chronic Care Management Pharmacy Assistant   Name: Katie Zimmerman  MRN: [id number] DOB: May 07, 1946  Reason for Encounter: Patient Assistance Application - Trelegy  Medications: Outpatient Encounter Medications as of 10/06/2020  Medication Sig   acetaminophen (TYLENOL) 500 MG tablet Take 1,000 mg by mouth every 4 (four) hours as needed for mild pain, moderate pain or headache.    albuterol (VENTOLIN HFA) 108 (90 Base) MCG/ACT inhaler Inhale 2 puffs into the lungs every 6 (six) hours as needed for wheezing or shortness of breath.   Cholecalciferol (VITAMIN D) 50 MCG (2000 UT) tablet Take 2,000 Units by mouth daily.   DULoxetine (CYMBALTA) 30 MG capsule Take 1 tablet every night   Fluticasone-Umeclidin-Vilant (TRELEGY ELLIPTA) 100-62.5-25 MCG/INH AEPB Inhale 1 puff then rinse mouth once daily   gabapentin (NEURONTIN) 600 MG tablet Take 2 tablets three times a day   Multiple Vitamins-Minerals (GNP HAIR/SKIN/NAILS PO) Take by mouth daily.   nortriptyline (PAMELOR) 50 MG capsule Take 3 capsules every night   traMADol (ULTRAM) 50 MG tablet TAKE 1/2 TO 1 (ONE-HALF TO ONE) TABLET BY MOUTH ONCE DAILY AS NEEDED FOR SEVERE PAIN   vitamin B-12 (CYANOCOBALAMIN) 1000 MCG tablet Take 1,000 mcg by mouth daily.   Facility-Administered Encounter Medications as of 10/06/2020  Medication   methylPREDNISolone acetate (DEPO-MEDROL) injection 80 mg   Patient assistance application for Trelegy has been completed on behalf of the patient. Patient contacted to verify if she would like the application mailed to her or if she prefers to sign in the office. Patient would like to come in the office to sign. Patient aware she will need to provide requested income verification to be sent the manufacturer.    Follow-Up:  Patient Assistance Coordination and Pharmacist Review  [name], CPP notified  CPA Time: 35 min [name], [place] [phone]  I have reviewed the care  management and care coordination activities outlined in this encounter and I am certifying that I agree with the content of this note. No further action required.  [name], PharmD Clinical Pharmacist [facility] at [facility] [phone]

## 2020-10-06 NOTE — Progress Notes (Signed)
Chronic Care Management Pharmacy Note  10/06/2020 Name:  Katie Zimmerman DOB:  1946-03-27  Subjective: Katie Zimmerman is an 75 y.o. year old female who is a primary patient of Bedsole, Amy E, MD.  The CCM team was consulted for assistance with disease management and care coordination needs.    Engaged with patient by telephone for follow up visit in response to provider referral for pharmacy case management and/or care coordination services.   Consent to Services:  The patient was given information about Chronic Care Management services, agreed to services, and gave verbal consent prior to initiation of services.  Please see initial visit note for detailed documentation.   Patient Care Team: [name], MD as PCP - General Delice Lesch Lezlie Octave, MD as Consulting Physician (Neurology) [name], [facility] as Pharmacist (Pharmacist) Cameron Sprang, MD as Consulting Physician (Neurology)  Recent office visits: 09/30/20 - PCP - Right shoulder pain from fall in March, likely due to peripheral neuropathy and low grip shoes. Laceration to forehead, apply ice.   Recent consult visits: 08/28/20 - Pulmonology - COPD with acute exacerbation - Start doxycycline, restart Trelegy (Sample x2), depomedrol today, CXR 06/29/20 - Neurology - Post herpetic neuralgia - START Cymbalta 30mg  qhs. Continue gabapentin 1200mg  TID, tramadol 1/2 to 1 PRN and nortriptyline 150mg  qhs. Follow up 6-8 months.   Hospital visits: 08/21/20 - ED, fall, tripped over curb walking down the street  Objective:  Lab Results  Component Value Date   CREATININE 0.87 03/06/2020   BUN 7 (L) 03/06/2020   GFR 52.95 (L) 03/02/2020   GFRNONAA >60 03/06/2020   GFRAA 58 (L) 01/16/2019   NA 140 03/06/2020   K 3.8 03/06/2020   CALCIUM 9.5 03/06/2020   CO2 22 03/06/2020   GLUCOSE 118 (H) 03/06/2020    Lab Results  Component Value Date/Time   HGBA1C 6.4 03/02/2020 12:09 PM   HGBA1C 6.0 (A) 09/03/2019 12:30 PM   HGBA1C  6.9 (H) 02/28/2019 09:16 AM   GFR 52.95 (L) 03/02/2020 12:09 PM   GFR 58.93 (L) 01/01/2020 09:22 AM   MICROALBUR 9.7 (H) 09/03/2019 12:33 PM    Last diabetic Eye exam:  Lab Results  Component Value Date/Time   HMDIABEYEEXA No Retinopathy 10/19/2018 12:00 AM    Last diabetic Foot exam:  Lab Results  Component Value Date/Time   HMDIABFOOTEX done 09/02/2019 12:00 AM     Lab Results  Component Value Date   CHOL 180 03/02/2020   HDL 49.90 03/02/2020   LDLCALC 110 (H) 03/02/2020   LDLDIRECT 102.0 02/20/2018   TRIG 103.0 03/02/2020   CHOLHDL 4 03/02/2020    Hepatic Function Latest Ref Rng & Units 03/06/2020 03/02/2020 01/01/2020  Total Protein 6.5 - 8.1 g/dL 8.1 7.6 8.2  Albumin 3.5 - 5.0 g/dL 4.1 4.1 4.4  AST 15 - 41 U/L 34 20 23  ALT 0 - 44 U/L 22 18 22   Alk Phosphatase 38 - 126 U/L 107 102 111  Total Bilirubin 0.3 - 1.2 mg/dL 0.9 0.6 0.4  Bilirubin, Direct 0.0 - 0.3 mg/dL - - 0.0    No results found for: TSH, FREET4  CBC Latest Ref Rng & Units 03/06/2020 01/01/2020 11/12/2019  WBC 4.0 - 10.5 K/uL 10.7(H) 11.2(H) 6.6  Hemoglobin 12.0 - 15.0 g/dL 14.5 14.5 12.3  Hematocrit 36.0 - 46.0 % 44.5 44.3 37.0  Platelets 150 - 400 K/uL 253 397.0 313.0    Lab Results  Component Value Date/Time   VD25OH  61.06 02/20/2018 09:03 AM   VD25OH 40.75 02/07/2017 09:04 AM    Clinical ASCVD: No  The 10-year ASCVD risk score Mikey Bussing DC Jr., et al., 2013) is: 30%   Values used to calculate the score:     Age: 65 years     Sex: Female     Is Non-Hispanic African American: No     Diabetic: Yes     Tobacco smoker: No     Systolic Blood Pressure: 474 mmHg     Is BP treated: No     HDL Cholesterol: 49.9 mg/dL     Total Cholesterol: 180 mg/dL    Depression screen [facility] 2/9 03/27/2020 02/27/2019 02/20/2018  Decreased Interest 0 0 0  Down, Depressed, Hopeless 0 0 0  PHQ - 2 Score 0 0 0  Altered sleeping 1 0 0  Tired, decreased energy 0 0 0  Change in appetite 0 0 0  Feeling bad or failure about  yourself  0 0 0  Trouble concentrating 0 0 0  Moving slowly or fidgety/restless 0 0 0  Suicidal thoughts 0 0 0  PHQ-9 Score 1 0 0  Difficult doing work/chores Somewhat difficult Not difficult at all Not difficult at all  Some recent data might be hidden    Social History   Tobacco Use  Smoking Status Passive Smoke Exposure - Never Smoker  Smokeless Tobacco Never Used  Tobacco Comment   Husband smoked   BP Readings from Last 3 Encounters:  09/30/20 140/90  08/28/20 140/90  06/29/20 (!) 149/92   Pulse Readings from Last 3 Encounters:  09/30/20 81  08/28/20 87  06/29/20 (!) 107   Wt Readings from Last 3 Encounters:  09/30/20 166 lb 8 oz (75.5 kg)  08/28/20 172 lb 6.4 oz (78.2 kg)  06/29/20 170 lb 12.8 oz (77.5 kg)   BMI Readings from Last 3 Encounters:  09/30/20 34.80 kg/m  08/28/20 36.03 kg/m  06/29/20 36.96 kg/m    Assessment/Interventions: Review of patient past medical history, allergies, medications, health status, including review of consultants reports, laboratory and other test data, was performed as part of comprehensive evaluation and provision of chronic care management services.   SDOH:  (Social Determinants of Health) assessments and interventions performed: Yes SDOH Interventions   Flowsheet Row Most Recent Value  SDOH Interventions   Financial Strain Interventions Other (Comment)  [Apply for Trelegy assistance]     SDOH Screenings   Alcohol Screen: Not on file  Depression (PHQ2-9): Low Risk   . PHQ-2 Score: 1  Financial Resource Strain: Medium Risk  . Difficulty of Paying Living Expenses: Somewhat hard  Food Insecurity: Not on file  Housing: Not on file  Physical Activity: Not on file  Social Connections: Not on file  Stress: Not on file  Tobacco Use: Medium Risk  . Smoking Tobacco Use: Passive Smoke Exposure - Never Smoker  . Smokeless Tobacco Use: Never Used  Transportation Needs: Not on file    CCM Care Plan  Allergies  Allergen  Reactions  . Penicillins Anaphylaxis, Rash and Other (See Comments)    Has patient had a PCN reaction causing immediate rash, facial/tongue/throat swelling, SOB or lightheadedness with hypotension: Yes Has patient had a PCN reaction causing severe rash involving mucus membranes or skin necrosis: No Has patient had a PCN reaction that required hospitalization No Has patient had a PCN reaction occurring within the last 10 years: No If all of the above answers are "NO", then may proceed with Cephalosporin use.  Marland Kitchen  Venlafaxine Nausea Only  . Sulfonamide Derivatives Rash    Medications Reviewed Today    Reviewed by [name], [facility] (Pharmacist) on 10/06/20 at 1354  Med List Status: <None>  Medication Order Taking? Sig Documenting Provider Last Dose Status Informant  acetaminophen (TYLENOL) 500 MG tablet [id number] Yes Take 1,000 mg by mouth every 4 (four) hours as needed for mild pain, moderate pain or headache.  [provider] Taking Active Self  albuterol (VENTOLIN HFA) 108 (90 Base) MCG/ACT inhaler [id number] Yes Inhale 2 puffs into the lungs every 6 (six) hours as needed for wheezing or shortness of breath. Baird Lyons D, MD Taking Active         Discontinued 10/06/20 1354 (No longer needed (for PRN medications))   Cholecalciferol (VITAMIN D) 50 MCG (2000 UT) tablet [id number] Yes Take 2,000 Units by mouth daily. [provider] Taking Active   DULoxetine (CYMBALTA) 30 MG capsule [id number] Yes Take 1 tablet every night Cameron Sprang, MD Taking Active   Fluticasone-Umeclidin-Vilant (TRELEGY ELLIPTA) 100-62.5-25 MCG/INH AEPB [id number] Yes Inhale 1 puff then rinse mouth once daily Baird Lyons D, MD Taking Active   gabapentin (NEURONTIN) 600 MG tablet [id number] Yes Take 2 tablets three times a day Cameron Sprang, MD Taking Active   methylPREDNISolone acetate (DEPO-MEDROL) injection 80 mg [id number]   Deneise Lever, MD  Active   Multiple Vitamins-Minerals (GNP  HAIR/SKIN/NAILS PO) [id number] Yes Take by mouth daily. [provider] Taking Active   nortriptyline (PAMELOR) 50 MG capsule [id number] Yes Take 3 capsules every night Cameron Sprang, MD Taking Active   traMADol (ULTRAM) 50 MG tablet [id number] Yes TAKE 1/2 TO 1 (ONE-HALF TO ONE) TABLET BY MOUTH ONCE DAILY AS NEEDED FOR SEVERE PAIN Cameron Sprang, MD Taking Active   vitamin B-12 (CYANOCOBALAMIN) 1000 MCG tablet [id number] Yes Take 1,000 mcg by mouth daily. [provider] Taking Active           Patient Active Problem List   Diagnosis Date Noted  . Laceration of left hand without foreign body 09/30/2020  . Nipple discharge 09/30/2020  . Acute pain of right shoulder 09/30/2020  . Fall 08/28/2020  . Purpura ([place]) 11/12/2019  . Situational anxiety 11/12/2019  . Grieving 09/03/2019  . Elevated blood pressure reading without diagnosis of hypertension 04/06/2019  . Leukocytosis 01/01/2019  . Cough, persistent 10/17/2017  . Headache 02/21/2017  . COPD with acute exacerbation ([place]) 12/30/2016  . Class 2 severe obesity due to excess calories with serious comorbidity and body mass index (BMI) of 36.0 to 36.9 in adult ([place]) 08/09/2016  . Increased endometrial stripe thickness 03/18/2016  . [place] arthritis 01/22/2015  . Counseling regarding end of life decision making 01/22/2015  . Osteoporosis 01/28/2014  . Hearing loss in right ear 01/20/2014  . Personal history of colonic polyps-adenomas 05/25/2012  . GERD (gastroesophageal reflux disease)   . History of radiation therapy   . Cancer of right breast, stage 0 01/26/2012  . Prediabetes 11/22/2011  . High cholesterol 11/22/2011  . TEMPOROMANDIBULAR JOINT PAIN 11/04/2009  . RENAL CALCULUS 05/04/2009  . Post herpetic neuralgia 04/14/2009    Immunization History  Administered Date(s) Administered  . Fluad Quad(high Dose 65+) 03/05/2019, 03/27/2020  . Influenza Split 05/29/2012  . Influenza Whole 03/07/2005  . Influenza,  High Dose Seasonal PF 06/21/2018  . Influenza,inj,Quad PF,6+ Mos 01/20/2014, 01/22/2015, 02/04/2016, 02/07/2017  . PFIZER(Purple Top)SARS-COV-2 Vaccination 07/05/2019, 07/30/2019, 07/21/2020  . Pneumococcal Conjugate-13 01/20/2014  . Pneumococcal Polysaccharide-23 10/21/2011  .  Td 07/02/1998, 03/27/2020  . Zoster Recombinat (Shingrix) 04/07/2020    Conditions to be addressed/monitored:  Hyperlipidemia, COPD, Anxiety and Pre-Diabetes  Care Plan : [place]  Updates made by [name], [facility] since 10/06/2020 12:00 AM    Problem: CHL AMB "PATIENT-SPECIFIC PROBLEM"     Goal: Patient Stated     Long-Range Goal: Disease Management   Start Date: 10/06/2020  Priority: High  Note:    Current Barriers:  . None identified  Pharmacist Clinical Goal(s):  Marland Kitchen Patient will contact provider office for questions/concerns as evidenced notation of same in electronic health record through collaboration with PharmD and provider.   Interventions: . 1:1 collaboration with [name], MD regarding development and update of comprehensive plan of care as evidenced by provider attestation and co-signature . Inter-disciplinary care team collaboration (see longitudinal plan of care) . Comprehensive medication review performed; medication list updated in electronic medical record  Hyperlipidemia: (LDL goal < 100) -Not ideally controlled - LDL 110 -Current treatment: . None -Medications previously tried: none  -Educated on Importance of routine exercise  -Recommended increase exercise  Pre-diabetes (A1c goal <6.5%) -Controlled - A1c 6.4%  -Current medications: . None -Medications previously tried: none -Current home glucose readings - none  -Denies hypoglycemic/hyperglycemic symptoms -Current exercise: none currrently -Educated on A1c and blood sugar goals; -Counseled to check feet daily and get yearly eye exams - eye exam is due May 2022 -Recommended schedule diabetic eye exam    COPD (Goal: control symptoms and prevent exacerbations) -Controlled - patient reports doing well, less coughing and shortness of breath -Follows with pulmonolgy -Current treatment  . Rescue: Albuterol inhaler - 2 puffs every 6 hours . Maintenance: Trelegy 100-62.5-25 mcg - 1 puff daily  -Medications previously tried: none  -Exacerbations requiring treatment in last 6 months: yes - prior to starting Trelegy last month -Patient reports consistent use of maintenance inhaler -Frequency of rescue inhaler use: pt reports rare use, less than once weekly  She has not picked up Trelegy yet from pharmacy, finishing out sample this week.  -Counseled on Benefits of consistent maintenance inhaler use Differences between maintenance and rescue inhalers -Recommended to continue current medication Assessed patient finances. Apply for Trelegy assistance  Anxiety (Goal: Control symptoms) -Controlled - patient reports doing well off therapy -Current treatment: . None -Medications previously tried/failed:  Xanax - no longer needed per patient Husband passed last year. She reports doing pretty well. She participates in grief sharing at church every Saturday. -Recommended continue to engage in social support through her church  Osteoporosis (Goal: Prevent falls and fractures) -Last DEXA Scan: 2020  T-Score femoral neck: n/a  T-Score total hip: n/a  T-Score lumbar spine: -2.7  T-Score forearm radius: n/a -Patient is a candidate for pharmacologic treatment due to T-Score < -2.5 in lumbar spine; Patient prior declined medication therapy per PCP note -Current treatment  . Vitamin D3 2000 IU - 1 tablet daily  -Medications previously tried: none -Recommend [phone] units of vitamin D daily. Recommend 1200 mg of calcium daily from dietary and supplemental sources. Recommend weight-bearing and muscle strengthening exercises for building and maintaining bone density. -Recommended to continue current  medication  Neuropathy (Goal: Control symptoms) -Followed by neurology -She reports much improvement since starting duloxetine -Current treatment  . Gabapentin 600 mg - 2 tablets three times per day . Nortriptyline 50 mg - 3 capsules every night . Duloxetine 30 mg - 1 capsule every night . Tramadol 50 mg - 1/2 to 1  tablet daily as needed for severe pain . Vitamin B12 1000 mcg - 1 tablet daily -Medications previously tried: none reported -Uses as needed: Duloxetine and tramadol  -Recommended to continue current medication  Patient Goals/Self-Care Activities . Patient will:  - take medications as prescribed  -fill out paperwork for Trelegy assistance in office     Over the counter:  Multivitamin - daily  Hair Skin and Nails - daily  Tylenol 500 mg - 2 tablets every 4 hours as needed for mild to moderate pain (pt takes for arthritis back pain)  Follow Up Plan: Telephone follow up appointment with care management team member scheduled for: 9 months  Medication Assistance: Patient reports Trelegy cost is concern. She would like to apply for assistance.  Patient's preferred pharmacy is: [facility] [address], [place] - [place] [address] Phone: [phone] Fax: [phone]  Uses pill box? Yes Pt endorses 100% compliance  We discussed: Current pharmacy is preferred with insurance plan and patient is satisfied with pharmacy services Patient decided to: Continue current medication management strategy  Care Plan and Follow Up Patient Decision:  Patient agrees to Care Plan and Follow-up.  [name], PharmD Clinical Pharmacist [facility] [phone]

## 2020-10-06 NOTE — Progress Notes (Signed)
Encounter details: CCM Time Spent       Value Time User   Time spent with patient (minutes)  65 10/06/2020  2:34 PM [name], [facility]   Time spent performing Chart review  30 10/06/2020  2:34 PM [name], [facility]   Total time (minutes)  95 10/06/2020  2:34 PM [name], RPH      Moderate to High Complex Decision Making       Value Time User   Moderate to High complex decision making  Yes 10/06/2020  2:34 PM [name], [facility]      CCM Services: This encounter meets complex CCM services and moderate to high decision making.  Prior to outreach and patient consent for Chronic Care Management, I referred this patient for services after reviewing the nominated patient list or from a personal encounter with the patient.  I have personally reviewed this encounter including the documentation in this note and have collaborated with the care management provider regarding care management and care coordination activities to include development and update of the comprehensive care plan. I am certifying that I agree with the content of this note and encounter as supervising physician.

## 2020-10-13 ENCOUNTER — Telehealth: Payer: Self-pay

## 2020-10-13 NOTE — Telephone Encounter (Addendum)
Unable to reach pt by phone; left v/m requesting pt to cb for additional information about time frame of exposure and any symptoms for pt at this time. Per immunization record pt had pfizer vaccinations on 07/05/19 and 07/30/19 and pfizer booster on 07/21/20. Sending note to myself and Butch Penny CMA. Pt called back brother in law and his daughter and grandson live with pt and they all have covid and grandson was dx on 10/07/20 with + covid, brother in law did home test last night and was + covid and his daughter tested + this morning.   Pt does not know when her family started with symptoms; pts brother in law mentioned h/a and s/t on 10/12/20.  Pt has prod cough with yellow greenish phlegm that pt usually has with COPD.  Pt is going to call pulmonologist about possibly getting a nebulizer like her husband used to have to see if that would help. No other covid symptoms; pt will try and quarantine from the other family members in the home, pt will wear a mask also. Pt will cb if develops any symptoms and wants to know if there is anything else pt should do at this point.

## 2020-10-13 NOTE — Telephone Encounter (Signed)
Addendum: I do not see any recommendations for post exposure prophylaxis in COPD .Marland Kitchen ie not recommended

## 2020-10-13 NOTE — Telephone Encounter (Signed)
Patient called and did not answer LVM for patient to call back for Dr. Rometta Emery recommendations.

## 2020-10-13 NOTE — Telephone Encounter (Signed)
Start vit C and vit D, push fluids, rest and avoid family positive or anyone else exposed.  No need for appt. If any symptoms start.. have home and or PCR COVID test. If positive  needs OV to  for discussion of treatment options such as oral antivirals.   I will look into any post exposure prophylaxis options to see if available and if she qualifies given COPD and household exposure.. will let he know if she does.

## 2020-10-13 NOTE — Telephone Encounter (Signed)
Patient called back and was informed of Dr. Arley Phenix recommendations. Patient stated that she is thankful for the recommendations and that she will push fluids and vitamins.

## 2020-10-13 NOTE — Telephone Encounter (Signed)
[place] [facility] - Client TELEPHONE ADVICE RECORD [facility] Patient Name: Katie Zimmerman Gender: Female DOB: 21-Mar-1946 Age: 75 Y 9 M 12 D Return Phone Number: [phone] (Primary) Address: City/ State/ Zip: [place] [address] Client [place] [facility] - Client Client Site [place] - Day Physician Eliezer Lofts - MD Contact Type Call Who Is Calling Patient / Member / Family / Caregiver Call Type Triage / Clinical Relationship To Patient Self Return Phone Number [phone] (Primary) Chief Complaint Infection Exposure (non-symptomatic) Reason for Call Symptomatic / Request for [name] states she was exposed to family members tested positive for covid . Caller states she tested negative for covid with an at home kit . Caller states they all live under the same household. Caller is wanting to know what to do, if she should see her doctor. Caller was transferred over from the office for triage . Caller is not having any symptoms just a cough due to COPD. Translation No Nurse Assessment Nurse: Leilani Merl, RN, Heather Date/Time (Eastern Time): 10/13/2020 11:16:04 AM Confirm and document reason for call. If symptomatic, describe symptoms. ---Caller states she was exposed to family members tested positive for covid . Caller states she tested negative for covid with an at home kit . Caller states they all live under the same household. Caller is wanting to know what to do, if she should see her doctor. Caller was transferred over from the office for triage . Caller is not having any symptoms just a cough due to COPD. Does the patient have any new or worsening symptoms? ---No Guidelines Guideline Title Affirmed Question Affirmed Notes Nurse Date/Time (Eastern Time) COVID-19 - Exposure [1] CLOSE CONTACT COVID-19 EXPOSURE within last 14 days AND [2] NO  symptoms Standifer, RN, Nira Conn 10/13/2020 11:16:21 AM Disp. Time Eilene Ghazi Time) Disposition Final User 10/13/2020 11:25:22 AM Home Care Yes Standifer, RN, Heather PLEASE NOTE: All timestamps contained within this report are represented as [place] Standard Time. CONFIDENTIALTY NOTICE: This fax transmission is intended only for the addressee. It contains information that is legally privileged, confidential or otherwise protected from use or disclosure. If you are not the intended recipient, you are strictly prohibited from reviewing, disclosing, copying using or disseminating any of this information or taking any action in reliance on or regarding this information. If you have received this fax in error, please notify us immediately by telephone so that we can arrange for its return to [place]. Phone: [phone], Toll-Free: [phone], Fax: [phone] Page: 2 of 2 Call Id: [id number] Caller Disagree/Comply Comply Caller Understands Yes PreDisposition Call Doctor Care Advice Given Per Guideline HOME CARE: * You should be able to treat this at home. * WEAR A MASK: Wear a well-fitted mask for 10 full days any time you are around others inside your home or in public. Do not go to places where you are unable to wear a mask. * WATCH FOR SYMPTOMS: Watch for symptoms of COVID-19 until 14 days after you last had close contact with someone with COVID-19. * GET TESTED: Get tested at least 5 days after you last had close contact with someone with COVID-19. CALL BACK IF: * You have more questions * Other symptoms you think might be from COVID-19 occur CARE ADVICE given per COVID-19 - Exposure (Adult) guideline.

## 2020-10-20 ENCOUNTER — Telehealth: Payer: Self-pay

## 2020-10-20 NOTE — Progress Notes (Signed)
CCM care plan from [place] appointment on 10/06/20 with [name], Pharm. D, was mailed to her per her request.   [name], CPP notified  [name], [place] [facility] Assistant [phone]

## 2020-10-29 ENCOUNTER — Telehealth: Payer: Self-pay

## 2020-10-29 NOTE — Chronic Care Management (AMB) (Addendum)
Chronic Care Management Pharmacy Assistant   Name: UILANI SANVILLE  MRN: [id number] DOB: 1945-12-08   Reason for Encounter: Katie Zimmerman   Recent office visits:  None since last CCM contact  Recent consult visits:  None since last CCM contact  Hospital visits:  08/21/20-ED tripped and fell over a curb  Medications: Outpatient Encounter Medications as of 10/29/2020  Medication Sig   acetaminophen (TYLENOL) 500 MG tablet Take 1,000 mg by mouth every 4 (four) hours as needed for mild pain, moderate pain or headache.    albuterol (VENTOLIN HFA) 108 (90 Base) MCG/ACT inhaler Inhale 2 puffs into the lungs every 6 (six) hours as needed for wheezing or shortness of breath.   Cholecalciferol (VITAMIN D) 50 MCG (2000 UT) tablet Take 2,000 Units by mouth daily.   DULoxetine (CYMBALTA) 30 MG capsule Take 1 tablet every night (Patient taking differently: Take 1 tablet every night (Patient taking as needed))   Fluticasone-Umeclidin-Vilant (TRELEGY ELLIPTA) 100-62.5-25 MCG/INH AEPB Inhale 1 puff then rinse mouth once daily   gabapentin (NEURONTIN) 600 MG tablet Take 2 tablets three times a day   Multiple Vitamins-Minerals (GNP HAIR/SKIN/NAILS PO) Take by mouth daily.   nortriptyline (PAMELOR) 50 MG capsule Take 3 capsules every night   traMADol (ULTRAM) 50 MG tablet TAKE 1/2 TO 1 (ONE-HALF TO ONE) TABLET BY MOUTH ONCE DAILY AS NEEDED FOR SEVERE PAIN   vitamin B-12 (CYANOCOBALAMIN) 1000 MCG tablet Take 1,000 mcg by mouth daily.   Facility-Administered Encounter Medications as of 10/29/2020  Medication   methylPREDNISolone acetate (DEPO-MEDROL) injection 80 mg    10/29/2020 Name: RAHMA MELLER MRN: [id number] DOB: Feb 14, 1946 CARLEEN RHUE is a 75 y.o. year old female who is a primary care patient of Diona Browner, Amy E, MD.  Comprehensive medication review performed; Spoke to patient regarding cholesterol  Lipid Panel    Component Value Date/Time   CHOL 180 03/02/2020 1209   TRIG 103.0 03/02/2020 1209    HDL 49.90 03/02/2020 1209   LDLCALC 110 (H) 03/02/2020 1209   LDLDIRECT 102.0 02/20/2018 0903    10-year ASCVD risk score: The 10-year ASCVD risk score Mikey Bussing DC Brooke Bonito., et al., 2013) is: 30%   Values used to calculate the score:     Age: 51 years     Sex: Female     Is Non-Hispanic African American: No     Diabetic: Yes     Tobacco smoker: No     Systolic Blood Pressure: 329 mmHg     Is BP treated: No     HDL Cholesterol: 49.9 mg/dL     Total Cholesterol: 180 mg/dL  Current antihyperlipidemic regimen: No pharmacotherapy  Previous antihyperlipidemic medications tried: none  ASCVD risk enhancing conditions: age >35  What recent interventions/DTPs have been made by any provider to improve Cholesterol control since last CPP Visit:  Encourage increase in exercise   Any recent hospitalizations or ED visits since last visit with CPP? No  What diet changes have been made to improve Cholesterol? The patient states she cooks healthy meals at home  What exercise is being done to improve Cholesterol? The patient walks daily and she wants to get into the [place] program and is having difficulty with insurance. She will call tomorrow to get appointment for annual eye exam.   Adherence Review: Does the patient have >5 day gap between last estimated fill dates?  No adherence medications identified  No appointments scheduled within the next 30 days.   Sharyn Lull  Andree Elk, CPP notified  [name], [place] Assistant [phone]  I have reviewed the care management and care coordination activities outlined in this encounter and I am certifying that I agree with the content of this note. No further action required.  [name], PharmD Clinical Pharmacist [facility] at [facility] [phone]

## 2020-11-12 ENCOUNTER — Telehealth: Payer: Self-pay

## 2020-11-12 NOTE — Telephone Encounter (Signed)
Katie Zimmerman,   Since she won't get care today, then set her up with an appointment tomorrow with an available provider.   Sorry to send note out to everyone, but I thought better to send to all recipients.

## 2020-11-12 NOTE — Telephone Encounter (Signed)
Would like a call back from nurse regarding her fall last night. This is not the first time she has fallen. She hit her head and has a knot on it. She has noticed herself falling more and more. She was wondering if she should see her neuro doctor. She is falling backwards and forwards. She said she blanks out and doesn't know she falls and then she comes to. She would like to be seen right away if she could.

## 2020-11-12 NOTE — Telephone Encounter (Signed)
Okay If seems to be okay now, can wait for my evaluation tomorrow morning

## 2020-11-12 NOTE — Telephone Encounter (Signed)
Called patient but was not able to leave a message due to mailbox is full. Trying to contact Emergency contact names in the chart.
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
## 2020-11-12 NOTE — Telephone Encounter (Signed)
I called Katie Zimmerman-patient's daughter- ok per DPR on file, advised we are trying to reach patient and she will try to reach patient but stated she may not be able to either. Both daughters do not live locally. Will need to try and reach out to patient again today.

## 2020-11-12 NOTE — Telephone Encounter (Signed)
Patient called stating that she fell last night and hit her head. Head was hurting last night but she applied ice and today it is just sore. She did not see anyone for this yet. Patient states she is not having dizziness or blurred vision but does feel shaky today. Patient states she has had recurrent falls in the past 2 months or less possibly. The other day she almost fell but caught herself. I asked patient how does he feel before falling but patient could not really describe it, she said it feels ":weird" like maybe she is going to pass out but could not say 100%.  I sent patient to Access [facility] to triage. [place] nurse called our office back letting us know that she spoke with patient and patient also mentioned that she could not recall all of the details that happened yesterday with the fall. Morey Hummingbird advised patient to go to ER now/today. Patient declined and wanted to call her neurologist to discuss this further. Patient's brother is with her currently and Morey Hummingbird advised him and patient several times that she does not recommend just calling the neurologist but to go to ER to be evaluated but patient stated she would not be doing that today.  Sending to Elige Ko, RN, and Dr Bedsole/Dr Copland as Juluis Rainier if anything further can be done. Notes from [place] at [facility] will be faxed over to [place].
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
## 2020-11-12 NOTE — Telephone Encounter (Signed)
Patient called back to [facility] stating she got in touch with [facility] and is waiting to hear back from them and feels that they should be the ones to see patient for this since it is a neurology issue. Patient stated she will call us back once she hears from them on how to proceed.

## 2020-11-12 NOTE — Telephone Encounter (Deleted)
Tried to call Katie Zimmerman. Her mailbox is full so I was unable to leave a message.  I have put her in with K. Clark on 12/13/2020 at 3:20 pm in case she calls back.

## 2020-11-12 NOTE — Telephone Encounter (Signed)
Spoke with pt she stated that she had a fall last night while getting ready for church she turned and went backwards and hit the back of her head on the door stob no bleeding noted but she does have knot she put ice on it last night. This morning she woke up feeling weird she is still not 100% back to normal pt was advised that she needed to go the ER to be evaluated she stated that she was going to call her PCP because they have opening and can see her. Pt advised that I will place her on the wait list to get her in to see Dr Delice Lesch but at this time she has no open spots in her schedule.

## 2020-11-13 ENCOUNTER — Ambulatory Visit: Payer: Medicare Other | Admitting: Primary Care

## 2020-11-13 ENCOUNTER — Other Ambulatory Visit: Payer: Self-pay

## 2020-11-13 ENCOUNTER — Ambulatory Visit (INDEPENDENT_AMBULATORY_CARE_PROVIDER_SITE_OTHER): Payer: Medicare Other | Admitting: Internal Medicine

## 2020-11-13 ENCOUNTER — Encounter: Payer: Self-pay | Admitting: Internal Medicine

## 2020-11-13 VITALS — BP 136/86 | HR 78 | Temp 97.2°F | Ht 59.0 in | Wt 166.0 lb

## 2020-11-13 DIAGNOSIS — R55 Syncope and collapse: Secondary | ICD-10-CM

## 2020-11-13 HISTORY — DX: Syncope and collapse: R55

## 2020-11-13 LAB — COMPREHENSIVE METABOLIC PANEL
ALT: 20 U/L (ref 0–35)
AST: 15 U/L (ref 0–37)
Albumin: 3.9 g/dL (ref 3.5–5.2)
Alkaline Phosphatase: 111 U/L (ref 39–117)
BUN: 11 mg/dL (ref 6–23)
CO2: 29 mEq/L (ref 19–32)
Calcium: 9.9 mg/dL (ref 8.4–10.5)
Chloride: 105 mEq/L (ref 96–112)
Creatinine, Ser: 0.88 mg/dL (ref 0.40–1.20)
GFR: 64.54 mL/min (ref >60.00)
Glucose, Bld: 91 mg/dL (ref 70–99)
Potassium: 4.1 mEq/L (ref 3.5–5.1)
Sodium: 143 mEq/L (ref 135–145)
Total Bilirubin: 0.3 mg/dL (ref 0.2–1.2)
Total Protein: 6.6 g/dL (ref 6.0–8.3)

## 2020-11-13 LAB — CBC
HCT: 38.5 % (ref 36.0–46.0)
Hemoglobin: 12.9 g/dL (ref 12.0–15.0)
MCHC: 33.5 g/dL (ref 30.0–36.0)
MCV: 87.8 fl (ref 78.0–100.0)
Platelets: 312 10*3/uL (ref 150.0–400.0)
RBC: 4.38 Mil/uL (ref 3.87–5.11)
RDW: 13.5 % (ref 11.5–15.5)
WBC: 9.1 10*3/uL (ref 4.0–10.5)

## 2020-11-13 LAB — VITAMIN B12: Vitamin B-12: >1550 pg/mL — ABNORMAL HIGH (ref 211–911)

## 2020-11-13 LAB — T4, FREE: Free T4: 1.14 ng/dL (ref 0.60–1.60)

## 2020-11-13 LAB — SEDIMENTATION RATE: Sed Rate: 42 mm/hr — ABNORMAL HIGH (ref 0–30)
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
## 2020-11-13 NOTE — Assessment & Plan Note (Addendum)
3 separate falls/syncope No premonitory symptoms and no BP meds---doesn't sound orthostatic (though she is always standing when it happens) No evidence of seizure disorder Gabapentin and nortriptyline could be part of the problem--will send note to Dr Delice Lesch to see what she thinks I am concerned about cardiogenic syncope--will set up with cardiology  Will also check labs to rule out anemia, organ dysfunction, etc

## 2020-12-03 ENCOUNTER — Ambulatory Visit: Payer: Medicare Other | Admitting: Adult Health

## 2020-12-08 ENCOUNTER — Ambulatory Visit: Payer: Medicare Other | Admitting: Neurology

## 2020-12-15 ENCOUNTER — Telehealth: Payer: Self-pay

## 2020-12-15 NOTE — Chronic Care Management (AMB) (Addendum)
    Chronic Care Management Pharmacy Assistant   Name: Katie Zimmerman  MRN: [id number] DOB: July 07, 1945   Reason for Encounter: Disease State - COPD  Recent office visits:  11/13/20 [name], PCP - Patient presented for syncope episode. Cardiology referral placed.  Recent consult visits:  None since last CCM  contact  Hospital visits:  None in previous 6 months  Medications: Outpatient Encounter Medications as of 12/15/2020  Medication Sig   acetaminophen (TYLENOL) 500 MG tablet Take 1,000 mg by mouth every 4 (four) hours as needed for mild pain, moderate pain or headache.    albuterol (VENTOLIN HFA) 108 (90 Base) MCG/ACT inhaler Inhale 2 puffs into the lungs every 6 (six) hours as needed for wheezing or shortness of breath.   Cholecalciferol (VITAMIN D) 50 MCG (2000 UT) tablet Take 2,000 Units by mouth daily.   DULoxetine (CYMBALTA) 30 MG capsule Take 1 tablet every night (Patient taking differently: Take 1 tablet every night (Patient taking as needed))   Fluticasone-Umeclidin-Vilant (TRELEGY ELLIPTA) 100-62.5-25 MCG/INH AEPB Inhale 1 puff then rinse mouth once daily   gabapentin (NEURONTIN) 600 MG tablet Take 2 tablets three times a day   Multiple Vitamins-Minerals (GNP HAIR/SKIN/NAILS PO) Take by mouth daily.   nortriptyline (PAMELOR) 50 MG capsule Take 3 capsules every night   traMADol (ULTRAM) 50 MG tablet TAKE 1/2 TO 1 (ONE-HALF TO ONE) TABLET BY MOUTH ONCE DAILY AS NEEDED FOR SEVERE PAIN   vitamin B-12 (CYANOCOBALAMIN) 1000 MCG tablet Take 1,000 mcg by mouth daily.   Facility-Administered Encounter Medications as of 12/15/2020  Medication   methylPREDNISolone acetate (DEPO-MEDROL) injection 80 mg    Attempted contact with [name] 3 times on 12/15/20, 12/18/20, 12/21/20. Unsuccessful outreach.  Current COPD regimen: Albuterol HFA  2 puffs every 6 hours           Trelegy100-62.5-25mcg  1 puff daily then rinse mouth Any recent hospitalizations or ED visits since last visit  with CPP? No Current tobacco use? No history of tobacco use  PCP appointment on 04/06/21, Cardiology appointment on 01/22/21, and Pulmonology appointment with on 12/28/20  [facility] Medication Name  Last Fill Date  Day supply None identified  [name], CPP notified  [name], [name] [phone]  I have reviewed the care management and care coordination activities outlined in this encounter and I am certifying that I agree with the content of this note. No further action required.  [name], PharmD Clinical Pharmacist [facility] at [facility] [phone]

## 2020-12-18 DIAGNOSIS — H52203 Unspecified astigmatism, bilateral: Secondary | ICD-10-CM | POA: Diagnosis not present

## 2020-12-25 NOTE — Progress Notes (Signed)
-HPI  female never smoker(prolonged second hand smoke) followed for chronic bronchitis, complicated by right breast cancer/XRT, GERD, Postherpetic Neuralgia,  Office Spirometry 10/16/2017-moderate restriction of exhaled volume without obvious obstruction.  FVC 1.57/68%, FEV1 1.27/73%, ratio 0.81, FEF 25-75% 1.37/88% Echocardiogram EF [id number], grade 1 diastolic dysfunction PFT [id number] restriction, minimal diffusion defect.  FVC 1.72/71%, FEV1 1.53/85%, ratio 0.89, TLC 69%, DLCO 74% Labs 11/17/2017-IgE 241 H, Eos  0.5, BNP 69 FENO 06/20/2018-- 11 WNL --------------------------------------------------------------   08/28/20- 75 year old female never smoker(prolonged second hand smoke) followed for chronic bronchitis/ COPD, complicated by right Breast Cancer/XRT, GERD , Postherpetic Neuralgia, Obesity,  -no inhalers now Covid vax-3 Phizer Flu vax-had Recent falls > ED.Marland Kitchen -----Patient states that she has a productive cough with green sputum. Worse at night. Shortness of breath with exertion. She says breathing and cough are not significantly changed since  hosp in July 2020 with RLL pneumonia, COPD exacerbation and acute hypoxic resp failure. Using rescue inhaler 2-3x/ week and has been out of Trelegy which did help. Green sputum, no blood or fever, adenopathy or chest pain. Had fallen twice. Came today wearing high heels which I discouraged given hx falls. Widowed, living with brother-in-law and his daughter.  12/28/20- 75 year old female never smoker(prolonged second hand smoke) followed for chronic bronchitis/ COPD, complicated by right Breast Cancer/XRT, GERD , Postherpetic Neuralgia, Obesity,  -Trelegy 100, Ventolin hfa Covid vax-3 Phizer -----Coughing and when coughing too much she begins to wheeze. Voice sometimes begins to go away.  Persistent cough, yellow, no fever. Denies reflux or postnasal drip. Has not had Covid infection. Has husband's neb machine- needs meds from a DME. Has been  falling- not tussive.  Has referrals pending for cardiology and neurology. CXR 08/28/20- The lungs are clear. Heart size is mildly enlarged. No pneumothorax or pleural effusion. No acute or focal bony abnormality. Surgical clips right breast noted. IMPRESSION: No acute disease.   ROS-see HPI  + = positive Constitutional:   No-   weight loss, night sweats, fevers, chills, fatigue, lassitude. HEENT:   No-  headaches, difficulty swallowing, tooth/dental problems, sore throat,       No-  sneezing, itching, ear ache, nasal congestion, post nasal drip,  CV:  No-   chest pain, orthopnea, PND, swelling in lower extremities, anasarca,                                     dizziness, palpitations Resp: +shortness of breath with exertion or at rest.             +productive cough,  + non-productive cough,  No- coughing up of blood.            +change in color of mucus.   +wheezing.   Skin: No-   rash or lesions. GI:  No-   heartburn, indigestion, abdominal pain, nausea, vomiting, diarrhea,                 change in bowel habits, loss of appetite GU:  MS:  No-   joint pain or swelling.   Neuro-    + Falls, + postherpetic neuralgia Psych:  No- change in mood or affect. No depression or anxiety.  No memory loss.  OBJ- Physical Exam General- Alert, Oriented, Affect-appropriate, Distress- none acute, + obese Skin- rash-none, lesions- none, excoriation- none Lymphadenopathy- none Head- atraumatic            Eyes- Gross vision intact,  PERRLA, conjunctivae and secretions clear            Ears- Hearing, canals-normal            Nose- Clear, no-Septal dev, mucus, polyps, erosion, perforation             Throat- Mallampati III , mucosa clear , drainage- none, tonsils- atrophic Neck- flexible , trachea midline, no stridor , thyroid nl, carotid no bruit Chest - symmetrical excursion , unlabored           Heart/CV- RRR , no murmur , no gallop  , no rub, nl s1 s2                           - JVD- none , edema-  none, stasis changes- none, varices- none           Lung- + clear, wheeze-none, cough+deep , dullness-none, rub- none           Chest wall-  +Hx R lumpectomy/XRT Abd-  Br/ Gen/ Rectal- Not done, not indicated Extrem- cyanosis- none, clubbing, none, atrophy- none, strength- nl Neuro- grossly intact to observation

## 2020-12-28 ENCOUNTER — Other Ambulatory Visit: Payer: Self-pay

## 2020-12-28 ENCOUNTER — Encounter: Payer: Self-pay | Admitting: Internal Medicine

## 2020-12-28 ENCOUNTER — Ambulatory Visit (INDEPENDENT_AMBULATORY_CARE_PROVIDER_SITE_OTHER): Payer: Medicare Other | Admitting: Internal Medicine

## 2020-12-28 DIAGNOSIS — J441 Chronic obstructive pulmonary disease with (acute) exacerbation: Secondary | ICD-10-CM

## 2020-12-28 MED ORDER — IPRATROPIUM-ALBUTEROL 0.5-2.5 (3) MG/3ML IN SOLN: 3.0000 mL | Freq: Four times a day (QID) | RESPIRATORY_TRACT | 12 refills | Status: DC | PRN

## 2020-12-28 MED ORDER — AZITHROMYCIN 250 MG PO TABS: ORAL_TABLET | ORAL | 0 refills | Status: DC

## 2020-12-28 MED ORDER — BENZONATATE 200 MG PO CAPS
200.0000 mg | ORAL_CAPSULE | Freq: Three times a day (TID) | ORAL | 1 refills | Status: DC | PRN
Start: 2020-12-28 — End: 2021-01-26

## 2020-12-28 NOTE — Assessment & Plan Note (Signed)
Will treat as low grade persistent bronchitis Plan- Zpak, refill perles, add Neb Duoneb

## 2020-12-28 NOTE — Patient Instructions (Signed)
Script for benzonatate perles sent to your drug store  Script printed for Duoneb nebulizer solution- We will order through a home care company. You already have the machine.   Keep on using your inhalers as you have been.    Please call if the cough doesn't get better.

## 2020-12-29 DIAGNOSIS — J449 Chronic obstructive pulmonary disease, unspecified: Secondary | ICD-10-CM | POA: Diagnosis not present

## 2020-12-30 ENCOUNTER — Telehealth: Payer: Self-pay | Admitting: Internal Medicine

## 2020-12-30 DIAGNOSIS — J441 Chronic obstructive pulmonary disease with (acute) exacerbation: Secondary | ICD-10-CM

## 2020-12-30 NOTE — Telephone Encounter (Signed)
I called and spoke with patient regarding message. Patient stated that [employer] sent in a new neb machine, hose and is still waiting on neb meds from them. Patient stated that she told them that she told us she already had a neb machine and [employer] stated that is what the order says. In looking the order stated that patient already has a machine but need supplies and meds. I will send in a order to [employer] to see if they can take the new machine back. Notified patient it was sent in and to let us know if they refuse to take it or we need to do anything else. Patient verbalized understanding, nothing further needed.

## 2020-12-31 ENCOUNTER — Telehealth: Payer: Self-pay | Admitting: Internal Medicine

## 2020-12-31 NOTE — Telephone Encounter (Signed)
error 

## 2021-01-14 ENCOUNTER — Emergency Department: Payer: Medicare Other

## 2021-01-14 ENCOUNTER — Encounter: Payer: Self-pay | Admitting: Emergency Medicine

## 2021-01-14 ENCOUNTER — Telehealth: Payer: Self-pay

## 2021-01-14 ENCOUNTER — Other Ambulatory Visit: Payer: Self-pay

## 2021-01-14 ENCOUNTER — Emergency Department
Admission: EM | Admit: 2021-01-14 | Discharge: 2021-01-14 | Disposition: A | Discharge status: Home / Self Care | Payer: Medicare Other | Attending: Emergency Medicine | Admitting: Emergency Medicine

## 2021-01-14 DIAGNOSIS — R112 Nausea with vomiting, unspecified: Secondary | ICD-10-CM | POA: Diagnosis not present

## 2021-01-14 DIAGNOSIS — Z853 Personal history of malignant neoplasm of breast: Secondary | ICD-10-CM | POA: Insufficient documentation

## 2021-01-14 DIAGNOSIS — K219 Gastro-esophageal reflux disease without esophagitis: Secondary | ICD-10-CM | POA: Insufficient documentation

## 2021-01-14 DIAGNOSIS — Z20822 Contact with and (suspected) exposure to covid-19: Secondary | ICD-10-CM | POA: Diagnosis not present

## 2021-01-14 DIAGNOSIS — R109 Unspecified abdominal pain: Secondary | ICD-10-CM | POA: Diagnosis not present

## 2021-01-14 DIAGNOSIS — J449 Chronic obstructive pulmonary disease, unspecified: Secondary | ICD-10-CM | POA: Diagnosis not present

## 2021-01-14 DIAGNOSIS — Z7951 Long term (current) use of inhaled steroids: Secondary | ICD-10-CM | POA: Diagnosis not present

## 2021-01-14 DIAGNOSIS — E860 Dehydration: Secondary | ICD-10-CM | POA: Diagnosis not present

## 2021-01-14 DIAGNOSIS — K8020 Calculus of gallbladder without cholecystitis without obstruction: Secondary | ICD-10-CM | POA: Diagnosis not present

## 2021-01-14 DIAGNOSIS — R110 Nausea: Secondary | ICD-10-CM

## 2021-01-14 LAB — COMPREHENSIVE METABOLIC PANEL
ALT: 43 U/L (ref 0–44)
AST: 27 U/L (ref 15–41)
Albumin: 4.1 g/dL (ref 3.5–5.0)
Alkaline Phosphatase: 131 U/L — ABNORMAL HIGH (ref 38–126)
Anion gap: 7 (ref 5–15)
BUN: 15 mg/dL (ref 8–23)
CO2: 25 mmol/L (ref 22–32)
Calcium: 9.6 mg/dL (ref 8.9–10.3)
Chloride: 106 mmol/L (ref 98–111)
Creatinine, Ser: 0.85 mg/dL (ref 0.44–1.00)
GFR, Estimated: >60 mL/min (ref >60)
Glucose, Bld: 117 mg/dL — ABNORMAL HIGH (ref 70–99)
Potassium: 3.7 mmol/L (ref 3.5–5.1)
Sodium: 138 mmol/L (ref 135–145)
Total Bilirubin: 0.6 mg/dL (ref 0.3–1.2)
Total Protein: 8 g/dL (ref 6.5–8.1)

## 2021-01-14 LAB — CBC
HCT: 46 % (ref 36.0–46.0)
Hemoglobin: 15.3 g/dL — ABNORMAL HIGH (ref 12.0–15.0)
MCH: 30.3 pg (ref 26.0–34.0)
MCHC: 33.3 g/dL (ref 30.0–36.0)
MCV: 91.1 fL (ref 80.0–100.0)
Platelets: 351 10*3/uL (ref 150–400)
RBC: 5.05 MIL/uL (ref 3.87–5.11)
RDW: 13.9 % (ref 11.5–15.5)
WBC: 9.1 10*3/uL (ref 4.0–10.5)
nRBC: 0 % (ref 0.0–0.2)

## 2021-01-14 LAB — RESP PANEL BY RT-PCR (FLU A&B, COVID) ARPGX2
Influenza A by PCR: NEGATIVE
Influenza B by PCR: NEGATIVE
SARS Coronavirus 2 by RT PCR: NEGATIVE

## 2021-01-14 LAB — LIPASE, BLOOD: Lipase: 52 U/L — ABNORMAL HIGH (ref 11–51)

## 2021-01-14 MED ORDER — ONDANSETRON HCL 4 MG PO TABS: 4.0000 mg | ORAL_TABLET | Freq: Three times a day (TID) | ORAL | 0 refills | Status: DC | PRN

## 2021-01-14 MED ORDER — IOHEXOL 350 MG/ML SOLN
80.0000 mL | Freq: Once | INTRAVENOUS | Status: AC | PRN
  Administered 2021-01-14: 80 mL via INTRAVENOUS
  Filled 2021-01-14: qty 80

## 2021-01-14 MED ORDER — SODIUM CHLORIDE 0.9 % IV BOLUS
1000.0000 mL | Freq: Once | INTRAVENOUS | Status: AC
  Administered 2021-01-14: 1000 mL via INTRAVENOUS

## 2021-01-14 NOTE — ED Provider Notes (Signed)
[facility] Emergency Department Provider Note  ____________________________________________   I have reviewed the triage vital signs and the nursing notes.   HISTORY  Chief Complaint Abdominal Pain and Emesis   History limited by: Not Limited   HPI Katie Zimmerman is a 75 y.o. female who presents to the emergency department today because of concern for possible dehydration. She states she was advised to come to the emergency department by PCP. She had talked to her PCP because of concern for decreased oral intake. She has had some nausea for a little over a week. She has had some slight abdominal discomfort with the nausea. Denies any bloody stool. No fevers. No known sick contacts.     Records reviewed. Per medical record review patient has a history of COPD, GERD.   Past Medical History:  Diagnosis Date   Breast cancer ([place]) 12/2011   Breast cancer ([place]) 12/4164   DCIS   Complication of anesthesia    hard to wake up age 71-not since   COPD (chronic obstructive pulmonary disease) ([place])    Fibromyalgia    GERD (gastroesophageal reflux disease)    History of radiation therapy 02/29/12 -04/21/12   right breast, 5040 cGy in 28 fx, boost to total 6240 cGy   Osteoporosis    Personal history of colonic polyps-adenomas 05/25/2012   Personal history of radiation therapy    PHN (postherpetic neuralgia) 2000   Right side of face   Pneumonia 12/19/2018    Patient Active Problem List   Diagnosis Date Noted   Syncope 11/13/2020   Laceration of left hand without foreign body 09/30/2020   Nipple discharge 09/30/2020   Acute pain of right shoulder 09/30/2020   Fall 08/28/2020   Purpura ([place]) 11/12/2019   Situational anxiety 11/12/2019   Grieving 09/03/2019   Elevated blood pressure reading without diagnosis of hypertension 04/06/2019   Leukocytosis 01/01/2019   Cough, persistent 10/17/2017   Headache 02/21/2017   COPD with acute exacerbation ([place]) 12/30/2016    Class 2 severe obesity due to excess calories with serious comorbidity and body mass index (BMI) of 36.0 to 36.9 in adult ([place]) 08/09/2016   Increased endometrial stripe thickness 03/18/2016   CMC arthritis 01/22/2015   Counseling regarding end of life decision making 01/22/2015   Osteoporosis 01/28/2014   Hearing loss in right ear 01/20/2014   Personal history of colonic polyps-adenomas 05/25/2012   GERD (gastroesophageal reflux disease)    History of radiation therapy    Cancer of right breast, stage 0 01/26/2012   Prediabetes 11/22/2011   High cholesterol 11/22/2011   TEMPOROMANDIBULAR JOINT PAIN 11/04/2009   RENAL CALCULUS 05/04/2009   Post herpetic neuralgia 04/14/2009    Past Surgical History:  Procedure Laterality Date   APPENDECTOMY     BIOPSY BREAST  01/10/12   Right breast needle cor biopsy   BREAST BIOPSY  01/10/2012   BREAST CYST ASPIRATION  01/10/2012   BREAST LUMPECTOMY Right 01/26/2012   Right/ERPR+ DCIS, right ax snbx   FOOT SURGERY  1996   rt/lt great toes   GLAUCOMA SURGERY  1993   laser   OVARIAN CYST [place]    Prior to Admission medications   Medication Sig Start Date End Date Taking? Authorizing Provider  acetaminophen (TYLENOL) 500 MG tablet Take 1,000 mg by mouth every 4 (four) hours as needed for mild pain, moderate pain or headache.     [provider]  albuterol (VENTOLIN  HFA) 108 (90 Base) MCG/ACT inhaler Inhale 2 puffs into the lungs every 6 (six) hours as needed for wheezing or shortness of breath. 08/28/20   Deneise Lever, MD  azithromycin (ZITHROMAX) 250 MG tablet 2 today then one daily 12/28/20   Baird Lyons D, MD  benzonatate (TESSALON) 200 MG capsule Take 1 capsule (200 mg total) by mouth 3 (three) times daily as needed for cough. 12/28/20   Baird Lyons D, MD  Cholecalciferol (VITAMIN D) 50 MCG (2000 UT) tablet Take 2,000 Units by mouth daily.    [provider]  DULoxetine (CYMBALTA) 30 MG  capsule Take 1 tablet every night Patient taking differently: Take 1 tablet every night (Patient taking as needed) 06/29/20   Cameron Sprang, MD  Fluticasone-Umeclidin-Vilant (TRELEGY ELLIPTA) 100-62.5-25 MCG/INH AEPB Inhale 1 puff then rinse mouth once daily 08/28/20   Baird Lyons D, MD  gabapentin (NEURONTIN) 600 MG tablet Take 2 tablets three times a day 06/29/20   Cameron Sprang, MD  ipratropium-albuterol (DUONEB) 0.5-2.5 (3) MG/3ML SOLN Take 3 mLs by nebulization every 6 (six) hours as needed. 12/28/20   Deneise Lever, MD  Multiple Vitamins-Minerals (GNP HAIR/SKIN/NAILS PO) Take by mouth daily.    [provider]  nortriptyline (PAMELOR) 50 MG capsule Take 3 capsules every night 06/29/20   Cameron Sprang, MD  traMADol (ULTRAM) 50 MG tablet TAKE 1/2 TO 1 (ONE-HALF TO ONE) TABLET BY MOUTH ONCE DAILY AS NEEDED FOR SEVERE PAIN 08/12/20   Cameron Sprang, MD  vitamin B-12 (CYANOCOBALAMIN) 1000 MCG tablet Take 1,000 mcg by mouth daily.    [provider]    Allergies Penicillins, Venlafaxine, and Sulfonamide derivatives  Family History  Problem Relation Age of Onset   Hypertension Mother    Cancer Mother        LIVER cancer   Hypertension Father    Diabetes Father    Heart failure Father    Breast cancer Sister 74       NOT hormone receptor breast cancer   Breast cancer Maternal Aunt 75   Breast cancer Cousin 67       maternal cousin   Cancer Cousin 64       ? lung cancer; maternal cousin   Colon cancer Neg Hx    Stomach cancer Neg Hx     Social History Social History   Tobacco Use   Smoking status: Never    Passive exposure: Yes   Smokeless tobacco: Never   Tobacco comments:    Husband smoked  Vaping Use   Vaping Use: Never used  Substance Use Topics   Alcohol use: Yes    Comment: occasional glass of wine   Drug use: No    Review of Systems Constitutional: No fever/chills Eyes: No visual changes. ENT: No sore throat. Cardiovascular: Denies  chest pain. Respiratory: Denies shortness of breath. Gastrointestinal: Abdominal pain, nausea.  Genitourinary: Negative for dysuria. Musculoskeletal: Negative for back pain. Skin: Negative for rash. Neurological: Negative for headaches, focal weakness or numbness.  ____________________________________________   PHYSICAL EXAM:  VITAL SIGNS: ED Triage Vitals  Enc Vitals Group     BP 01/14/21 1237 (!) 163/98     Pulse Rate 01/14/21 1237 80     Resp 01/14/21 1237 17     Temp 01/14/21 1237 98 F (36.7 C)     Temp Source 01/14/21 1237 Oral     SpO2 01/14/21 1237 93 %     Weight 01/14/21 1235 161 lb  9.6 oz (73.3 kg)     Height 01/14/21 1235 $RemoveBefor'4\' 10"'iabtScWTUrIJ$  (1.473 m)     Head Circumference --      Peak Flow --      Pain Score 01/14/21 1235 8   Constitutional: Alert and oriented.  Eyes: Conjunctivae are normal.  ENT      Head: Normocephalic and atraumatic.      Nose: No congestion/rhinnorhea.      Mouth/Throat: Mucous membranes are moist.      Neck: No stridor. Hematological/Lymphatic/Immunilogical: No cervical lymphadenopathy. Cardiovascular: Normal rate, regular rhythm.  No murmurs, rubs, or gallops.  Respiratory: Normal respiratory effort without tachypnea nor retractions. Breath sounds are clear and equal bilaterally. No wheezes/rales/rhonchi. Gastrointestinal: Soft and non tender. No rebound. No guarding.  Genitourinary: Deferred Musculoskeletal: Normal range of motion in all extremities. No lower extremity edema. Neurologic:  Normal speech and language. No gross focal neurologic deficits are appreciated.  Skin:  Skin is warm, dry and intact. No rash noted. Psychiatric: Mood and affect are normal. Speech and behavior are normal. Patient exhibits appropriate insight and judgment.  ____________________________________________    LABS (pertinent positives/negatives)  Lipase 52 CBC wbc 9.1, hgb 15.3, plt 351 CMP wnl except glu 117, alk phos 131 COVID  negative ____________________________________________   EKG  None  ____________________________________________    RADIOLOGY  CT abd/pel Large gallstone. Left sided staghorn calculus with cortical scarring.  ____________________________________________   PROCEDURES  Procedures  ____________________________________________   INITIAL IMPRESSION / ASSESSMENT AND PLAN / ED COURSE  Pertinent labs & imaging results that were available during my care of the patient were reviewed by me and considered in my medical decision making (see chart for details).   Patient presented because of concern for possible dehydration. Has had decreased oral intake recently. Fortunately no signs of AKI at this time. Patient was given IVFs. CT scan was obtained and did not show clear etiology of the patients symptoms. Did show gallstone and staghorn calculus which I discussed with patient. Will plan on discharging home with prescription for nausea medication.    ____________________________________________   FINAL CLINICAL IMPRESSION(S) / ED DIAGNOSES  Final diagnoses:  Dehydration  Nausea  Calculus of gallbladder without cholecystitis without obstruction     Note: This dictation was prepared with Dragon dictation. Any transcriptional errors that result from this process are unintentional     Nance Pear, MD 01/14/21 1820

## 2021-01-14 NOTE — Telephone Encounter (Signed)
Voice mail received from patient. Only information was that she had been sick for over a week ant that she felt she needed to be seen. Called patient to get more information but "caller not available at this time".

## 2021-01-14 NOTE — ED Triage Notes (Signed)
Pt comes into the ED via POV c/o abd discomfort, N/V/D since Thursday.  PT is concerned for possible dehydration.  PT currently has even and unlabored respirations and is in NAD.

## 2021-01-14 NOTE — Telephone Encounter (Signed)
I spoke with pt; pt symptoms started on 01/07/21;pt said nauseated and vomited last over the weekend. Not eating well; trying to keep fluids down. Pt has not cked temp but feels like fever due to lips very dry and pt feels hot. Pt's mouth is dry and lightheaded. Pt has had chills also. Did home covid test on 01/08/21 which was negative. Pt has H/A at pain level 6-7. Pt has dull pain on lower lt side that is on and off. Nose runny, head congestion on lt side; No diarrhea, no cough or SOB  and no S/T.  Pt said "she feels crummy". Pt has not seen any improvement in a wk.  Concern for dehydration and pt asked I call Harriett her sister to see if she would take pt to [facility] ED for eval,possible testing and treatment., I spoke with Harriett and she will go and take pt to [facility] ED now. Sending note to Dr Diona Browner and Butch Penny CMA.

## 2021-01-14 NOTE — ED Notes (Signed)
See triage note States she has not felt well for several days  States she did have stomach pain and some nausea  No fever or vomiting or diarrhea

## 2021-01-14 NOTE — Telephone Encounter (Signed)
Noted. Agree with ER visit.

## 2021-01-14 NOTE — Discharge Instructions (Addendum)
Please seek medical attention for any high fevers, chest pain, shortness of breath, change in behavior, persistent vomiting, bloody stool or any other new or concerning symptoms.  

## 2021-01-14 NOTE — ED Notes (Signed)
IV Team RN at bedside 

## 2021-01-19 ENCOUNTER — Other Ambulatory Visit: Payer: Self-pay

## 2021-01-19 ENCOUNTER — Emergency Department: Payer: Medicare Other

## 2021-01-19 ENCOUNTER — Emergency Department
Admission: EM | Admit: 2021-01-19 | Discharge: 2021-01-19 | Disposition: A | Discharge status: Home / Self Care | Payer: Medicare Other | Attending: Emergency Medicine | Admitting: Emergency Medicine

## 2021-01-19 ENCOUNTER — Telehealth: Payer: Self-pay | Admitting: *Deleted

## 2021-01-19 DIAGNOSIS — Z853 Personal history of malignant neoplasm of breast: Secondary | ICD-10-CM | POA: Diagnosis not present

## 2021-01-19 DIAGNOSIS — Z20822 Contact with and (suspected) exposure to covid-19: Secondary | ICD-10-CM | POA: Insufficient documentation

## 2021-01-19 DIAGNOSIS — R42 Dizziness and giddiness: Secondary | ICD-10-CM | POA: Diagnosis not present

## 2021-01-19 DIAGNOSIS — R9431 Abnormal electrocardiogram [ECG] [EKG]: Secondary | ICD-10-CM | POA: Diagnosis not present

## 2021-01-19 DIAGNOSIS — R059 Cough, unspecified: Secondary | ICD-10-CM | POA: Diagnosis not present

## 2021-01-19 DIAGNOSIS — E860 Dehydration: Secondary | ICD-10-CM | POA: Diagnosis not present

## 2021-01-19 DIAGNOSIS — J449 Chronic obstructive pulmonary disease, unspecified: Secondary | ICD-10-CM | POA: Insufficient documentation

## 2021-01-19 LAB — CBC
HCT: 43.7 % (ref 36.0–46.0)
Hemoglobin: 14.4 g/dL (ref 12.0–15.0)
MCH: 29.7 pg (ref 26.0–34.0)
MCHC: 33 g/dL (ref 30.0–36.0)
MCV: 90.1 fL (ref 80.0–100.0)
Platelets: 369 10*3/uL (ref 150–400)
RBC: 4.85 MIL/uL (ref 3.87–5.11)
RDW: 13.9 % (ref 11.5–15.5)
WBC: 10.2 10*3/uL (ref 4.0–10.5)
nRBC: 0 % (ref 0.0–0.2)

## 2021-01-19 LAB — BASIC METABOLIC PANEL
Anion gap: 17 — ABNORMAL HIGH (ref 5–15)
Anion gap: 7 (ref 5–15)
BUN: 18 mg/dL (ref 8–23)
BUN: 18 mg/dL (ref 8–23)
CO2: 15 mmol/L — ABNORMAL LOW (ref 22–32)
CO2: 24 mmol/L (ref 22–32)
Calcium: 9.4 mg/dL (ref 8.9–10.3)
Calcium: 9.4 mg/dL (ref 8.9–10.3)
Chloride: 103 mmol/L (ref 98–111)
Chloride: 104 mmol/L (ref 98–111)
Creatinine, Ser: 1.45 mg/dL — ABNORMAL HIGH (ref 0.44–1.00)
Creatinine, Ser: 1.29 mg/dL — ABNORMAL HIGH (ref 0.44–1.00)
GFR, Estimated: 38 mL/min — ABNORMAL LOW (ref >60)
GFR, Estimated: 43 mL/min — ABNORMAL LOW (ref >60)
Glucose, Bld: 132 mg/dL — ABNORMAL HIGH (ref 70–99)
Glucose, Bld: 95 mg/dL (ref 70–99)
Potassium: 3.6 mmol/L (ref 3.5–5.1)
Potassium: 3.5 mmol/L (ref 3.5–5.1)
Sodium: 135 mmol/L (ref 135–145)
Sodium: 135 mmol/L (ref 135–145)

## 2021-01-19 LAB — RESP PANEL BY RT-PCR (FLU A&B, COVID) ARPGX2
Influenza A by PCR: NEGATIVE
Influenza B by PCR: NEGATIVE
SARS Coronavirus 2 by RT PCR: NEGATIVE

## 2021-01-19 LAB — TROPONIN I (HIGH SENSITIVITY): Troponin I (High Sensitivity): 4 ng/L (ref <18)

## 2021-01-19 MED ORDER — LACTATED RINGERS IV BOLUS
1000.0000 mL | Freq: Once | INTRAVENOUS | Status: AC
  Administered 2021-01-19: 1000 mL via INTRAVENOUS

## 2021-01-19 NOTE — Telephone Encounter (Signed)
PLEASE NOTE: All timestamps contained within this report are represented as [place] Standard Time. CONFIDENTIALTY NOTICE: This fax transmission is intended only for the addressee. It contains information that is legally privileged, confidential or otherwise protected from use or disclosure. If you are not the intended recipient, you are strictly prohibited from reviewing, disclosing, copying using or disseminating any of this information or taking any action in reliance on or regarding this information. If you have received this fax in error, please notify us immediately by telephone so that we can arrange for its return to [place]. Phone: [phone], Toll-Free: [phone], Fax: [phone] Page: 1 of 1 Call Id: [id number] [facility] - Client TELEPHONE ADVICE RECORD [facility] Patient Name: Katie Zimmerman Gender: Female DOB: 1946-03-30 Age: 75 Y 18 D Return Phone Number: [phone] (Primary) Address: City/ State/ Zip: [place]  [address] Client [facility] - Client Client Site [facility] - Day Physician Eliezer Lofts - MD Contact Type Call Who Is Calling Patient / Member / Family / Caregiver Call Type Triage / Clinical Relationship To Patient Self Return Phone Number [phone] (Primary) Chief Complaint Dizziness Reason for Call Symptomatic / Request for [place] states she was recently in ED for dehydration and is having dizziness, nausea and diarrhea. Translation No Disp. Time Eilene Ghazi Time) Disposition Final User 01/19/2021 9:37:57 AM Attempt made - message left De[place], RN, Levada Dy 01/19/2021 9:49:04 AM FINAL ATTEMPT MADE - message left Yes Deaton, RN, Levada Dy

## 2021-01-19 NOTE — ED Notes (Addendum)
Pt lying in bed with fluids running. Fluids were delayed due to pt needing ultrasound line. Pt now awaiting fluids to finish and then have repeat BMP.

## 2021-01-19 NOTE — Discharge Instructions (Addendum)
After your fluids, your blood work was improved.  Please try to stay hydrated at home.  Please follow-up with your primary care provider within the next several days to have a repeat basic metabolic panel drawn to ensure that your kidney function is improving.

## 2021-01-19 NOTE — Telephone Encounter (Signed)
Patient called the office stating that she fell this morning going to the bathroom. Patient stated that she fell on her backside. Patient denies hitting her head or having any injuries. Patient stated that she felt like the room was spinning and she was dizzy. Patient denies passing out. Patient stated that she still feels nauseated but did not get the zofran prescribed to her at the ED because of the cost. Patient stated that she got OTC dramamine. Patient stated that she is still nauseated and feels real weak. Patient stated that she is drinking fluids but not eating much. Patient stated that she is not having any problems with urinating. Patient stated that she does not feel dehydrated like she did the other day when she went to the ER. Patient stated that she felt real sleepy when she got up this morning but feels a little more alert now. Patient denies, chest pain, any numbness or tingling. Patient wanted to know if she should go back to the ED because she feels that she needs to see someone today.Patient stated that she is unable to take her blood pressure because she can not find her machine. After speaking with Dr. Diona Browner patient was advised that she should be evaluated today with a face to face visit. Patient was given information on the [facility]. Patient stated that she is going to call her sister-in-law to see if she will come and take her to the UC. Patient was advised that she should not drive. Patient was given ER/911 precautions and she verbalized understanding.

## 2021-01-19 NOTE — ED Triage Notes (Signed)
Pt reports that she has been having some dizziness the last few days. She reports that she was here last week and they found that she was dehydrated. She states that she has increased her water intake, she has just been dizzy.

## 2021-01-20 NOTE — ED Provider Notes (Signed)
[facility]  ____________________________________________   Event Date/Time   First MD Initiated Contact with Patient 01/19/21 1756     (approximate)  I have reviewed the triage vital signs and the nursing notes.   HISTORY  Chief Complaint Dizziness    HPI Katie Zimmerman is a 75 y.o. female past medical history of GERD, fibromyalgia, COPD not on home oxygen who presents with lightheadedness.  Patient notes that over the the past 2 weeks she is felt generally unwell.  Initially she had nausea and vomiting but this has improved.  She still has minimal appetite and occasional nausea.  She feels lightheaded upon standing.  She denies any vertigo or dizziness, visual change numbness or weakness or difficulty with speech.  She denies associated chest pain shortness of breath or abdominal pain.  No urinary symptoms, fevers chills.  She does have some congestion and cough but this is not far from her baseline COPD.  She was seen 3 days ago in the ED and had labs and fluids and her symptoms were thought to be due to dehydration.  Patient went to urgent care today and was told to come to the emergency department.         Past Medical History:  Diagnosis Date   Breast cancer ([place]) 12/2011   Breast cancer ([place]) [id number]   DCIS   Complication of anesthesia    hard to wake up age 8-not since   COPD (chronic obstructive pulmonary disease) ([place])    Fibromyalgia    GERD (gastroesophageal reflux disease)    History of radiation therapy 02/29/12 -04/21/12   right breast, 5040 cGy in 28 fx, boost to total 6240 cGy   Osteoporosis    Personal history of colonic polyps-adenomas 05/25/2012   Personal history of radiation therapy    PHN (postherpetic neuralgia) 2000   Right side of face   Pneumonia 12/19/2018    Patient Active Problem List   Diagnosis Date Noted   Syncope 11/13/2020   Laceration of left hand without foreign body 09/30/2020   Nipple discharge 09/30/2020    Acute pain of right shoulder 09/30/2020   Fall 08/28/2020   Purpura ([place]) 11/12/2019   Situational anxiety 11/12/2019   Grieving 09/03/2019   Elevated blood pressure reading without diagnosis of hypertension 04/06/2019   Leukocytosis 01/01/2019   Cough, persistent 10/17/2017   Headache 02/21/2017   COPD with acute exacerbation ([place]) 12/30/2016   Class 2 severe obesity due to excess calories with serious comorbidity and body mass index (BMI) of 36.0 to 36.9 in adult ([place]) 08/09/2016   Increased endometrial stripe thickness 03/18/2016   CMC arthritis 01/22/2015   Counseling regarding end of life decision making 01/22/2015   Osteoporosis 01/28/2014   Hearing loss in right ear 01/20/2014   Personal history of colonic polyps-adenomas 05/25/2012   GERD (gastroesophageal reflux disease)    History of radiation therapy    Cancer of right breast, stage 0 01/26/2012   Prediabetes 11/22/2011   High cholesterol 11/22/2011   TEMPOROMANDIBULAR JOINT PAIN 11/04/2009   RENAL CALCULUS 05/04/2009   Post herpetic neuralgia 04/14/2009    Past Surgical History:  Procedure Laterality Date   APPENDECTOMY     BIOPSY BREAST  01/10/12   Right breast needle cor biopsy   BREAST BIOPSY  01/10/2012   BREAST CYST ASPIRATION  01/10/2012   BREAST LUMPECTOMY Right 01/26/2012   Right/ERPR+ DCIS, right ax snbx   FOOT SURGERY  1996   rt/lt great toes  [place]    Prior to Admission medications   Medication Sig Start Date End Date Taking? Authorizing Provider  acetaminophen (TYLENOL) 500 MG tablet Take 1,000 mg by mouth every 4 (four) hours as needed for mild pain, moderate pain or headache.     [provider]  albuterol (VENTOLIN HFA) 108 (90 Base) MCG/ACT inhaler Inhale 2 puffs into the lungs every 6 (six) hours as needed for wheezing or shortness of breath. 08/28/20   Deneise Lever, MD  azithromycin (ZITHROMAX) 250 MG  tablet 2 today then one daily 12/28/20   Baird Lyons D, MD  benzonatate (TESSALON) 200 MG capsule Take 1 capsule (200 mg total) by mouth 3 (three) times daily as needed for cough. 12/28/20   Baird Lyons D, MD  Cholecalciferol (VITAMIN D) 50 MCG (2000 UT) tablet Take 2,000 Units by mouth daily.    [provider]  DULoxetine (CYMBALTA) 30 MG capsule Take 1 tablet every night Patient taking differently: Take 1 tablet every night (Patient taking as needed) 06/29/20   Cameron Sprang, MD  Fluticasone-Umeclidin-Vilant (TRELEGY ELLIPTA) 100-62.5-25 MCG/INH AEPB Inhale 1 puff then rinse mouth once daily 08/28/20   Baird Lyons D, MD  gabapentin (NEURONTIN) 600 MG tablet Take 2 tablets three times a day 06/29/20   Cameron Sprang, MD  ipratropium-albuterol (DUONEB) 0.5-2.5 (3) MG/3ML SOLN Take 3 mLs by nebulization every 6 (six) hours as needed. 12/28/20   Deneise Lever, MD  Multiple Vitamins-Minerals (GNP HAIR/SKIN/NAILS PO) Take by mouth daily.    [provider]  nortriptyline (PAMELOR) 50 MG capsule Take 3 capsules every night 06/29/20   Cameron Sprang, MD  ondansetron (ZOFRAN) 4 MG tablet Take 1 tablet (4 mg total) by mouth every 8 (eight) hours as needed. 01/14/21   Cuthriell, Charline Bills, PA-C  traMADol (ULTRAM) 50 MG tablet TAKE 1/2 TO 1 (ONE-HALF TO ONE) TABLET BY MOUTH ONCE DAILY AS NEEDED FOR SEVERE PAIN 08/12/20   Cameron Sprang, MD  vitamin B-12 (CYANOCOBALAMIN) 1000 MCG tablet Take 1,000 mcg by mouth daily.    [provider]    Allergies Penicillins, Venlafaxine, and Sulfonamide derivatives  Family History  Problem Relation Age of Onset   Hypertension Mother    Cancer Mother        LIVER cancer   Hypertension Father    Diabetes Father    Heart failure Father    Breast cancer Sister 67       NOT hormone receptor breast cancer   Breast cancer Maternal Aunt 75   Breast cancer Cousin 26       maternal cousin   Cancer Cousin 47       ? lung cancer; maternal  cousin   Colon cancer Neg Hx    Stomach cancer Neg Hx     Social History Social History   Tobacco Use   Smoking status: Never    Passive exposure: Yes   Smokeless tobacco: Never   Tobacco comments:    Husband smoked  Vaping Use   Vaping Use: Never used  Substance Use Topics   Alcohol use: Yes    Comment: occasional glass of wine   Drug use: No    Review of Systems   Review of Systems  Constitutional:  Positive for appetite change and fatigue. Negative for chills and fever.  Respiratory:  Positive for cough. Negative for  shortness of breath.   Cardiovascular:  Negative for chest pain.  Gastrointestinal:  Negative for abdominal pain, nausea and vomiting.  Genitourinary:  Negative for dysuria.  Musculoskeletal:  Negative for myalgias.  Neurological:  Positive for light-headedness. Negative for weakness, numbness and headaches.  All other systems reviewed and are negative.  Physical Exam Updated Vital Signs BP (!) 144/78   Pulse 74   Temp 97.8 F (36.6 C) (Oral)   Resp 15   Ht 4' 10.5" (1.486 m)   Wt 68.9 kg   SpO2 98%   BMI 31.23 kg/m   Physical Exam Vitals and nursing note reviewed.  Constitutional:      General: She is not in acute distress.    Appearance: Normal appearance. She is not toxic-appearing.  HENT:     Head: Normocephalic and atraumatic.     Nose: Nose normal. No congestion.     Mouth/Throat:     Mouth: Mucous membranes are moist.     Comments: dry mucous membranes Eyes:     General: No scleral icterus.    Conjunctiva/sclera: Conjunctivae normal.  Cardiovascular:     Rate and Rhythm: Normal rate and regular rhythm.  Pulmonary:     Effort: Pulmonary effort is normal. No respiratory distress.     Breath sounds: Normal breath sounds. No wheezing.  Abdominal:     Palpations: Abdomen is soft.     Tenderness: There is no abdominal tenderness. There is no guarding.  Musculoskeletal:        General: No swelling, tenderness, deformity or signs of  injury. Normal range of motion.     Cervical back: Neck supple. No rigidity.  Skin:    General: Skin is warm and dry.     Coloration: Skin is not jaundiced.  Neurological:     General: No focal deficit present.     Mental Status: She is alert and oriented to person, place, and time.     Comments: Pupils equal round and reactive, extraocular movements intact, face symmetric, normal tongue movement 5 out of 5 strength in bilateral lower extremities Nml Finger-nose-finger  Psychiatric:        Mood and Affect: Mood normal.        Behavior: Behavior normal.     LABS (all labs ordered are listed, but only abnormal results are displayed)  Labs Reviewed  BASIC METABOLIC PANEL - Abnormal; Notable for the following components:      Result Value   CO2 15 (*)    Glucose, Bld 132 (*)    Creatinine, Ser 1.45 (*)    GFR, Estimated 38 (*)    Anion gap 17 (*)    All other components within normal limits  BASIC METABOLIC PANEL - Abnormal; Notable for the following components:   Creatinine, Ser 1.29 (*)    GFR, Estimated 43 (*)    All other components within normal limits  RESP PANEL BY RT-PCR (FLU A&B, COVID) ARPGX2  CBC  URINALYSIS, COMPLETE (UACMP) WITH MICROSCOPIC  CBG MONITORING, ED  TROPONIN I (HIGH SENSITIVITY)   ____________________________________________  EKG  Normal sinus rhythm, normal intervals, normal axis, no acute ischemic changes ____________________________________________  RADIOLOGY Almeta Monas, personally viewed and evaluated these images (plain radiographs) as part of my medical decision making, as well as reviewing the written report by the radiologist.  ED MD interpretation: I reviewed the chest x-ray does not show any acute cardiopulmonary process    ____________________________________________   PROCEDURES  Procedure(s) performed (including Critical Care):  Procedures   ____________________________________________   INITIAL IMPRESSION /  ASSESSMENT AND PLAN / ED COURSE     75 year old female presents with lightheadedness for the past 2 weeks.  Her main associated symptoms are some nausea and decreased p.o. intake due to lack of appetite.  However there are really no other focal symptoms to suggest infection, ischemia or stroke.  Her exam is normal as are her vital signs.  Her labs do suggest that she is somewhat dehydrated, initial BMP with AKI creatinine baseline 0.8-1.45 as well as an anion gap acidosis with a bicarb of 15, I suspect this is in the setting of starvation and dehydration.  Patient however looks very well.  Her troponin is negative EKG within normal limits.  Chest x-ray does not show any infiltrate.  COVID and influenza are negative.  Patient was given a liter of fluids and BMP repeated which shows resolution of the anion gap and normalization of her bicarb.  I discussed with the patient need to stay hydrated and to follow-up with her primary care provider in the next several days as she should have a repeat BMP to ensure that her renal function is stable.  Patient was comfortable with the plan.  Return precautions discussed      ____________________________________________   FINAL CLINICAL IMPRESSION(S) / ED DIAGNOSES  Final diagnoses:  Dehydration  Lightheadedness     ED Discharge Orders     None        Note:  This document was prepared using Dragon voice recognition software and may include unintentional dictation errors.    Rada Hay, MD 01/20/21 [phone]

## 2021-01-22 ENCOUNTER — Ambulatory Visit: Payer: Medicare Other | Admitting: Cardiovascular Disease

## 2021-01-26 ENCOUNTER — Telehealth (INDEPENDENT_AMBULATORY_CARE_PROVIDER_SITE_OTHER): Payer: Medicare Other | Admitting: Family Medicine

## 2021-01-26 ENCOUNTER — Encounter: Payer: Self-pay | Admitting: Family Medicine

## 2021-01-26 VITALS — BP 135/91 | HR 101 | Temp 97.3°F | Ht 59.0 in | Wt 154.4 lb

## 2021-01-26 DIAGNOSIS — E860 Dehydration: Secondary | ICD-10-CM | POA: Insufficient documentation

## 2021-01-26 DIAGNOSIS — N2889 Other specified disorders of kidney and ureter: Secondary | ICD-10-CM

## 2021-01-26 DIAGNOSIS — Z20822 Contact with and (suspected) exposure to covid-19: Secondary | ICD-10-CM | POA: Insufficient documentation

## 2021-01-26 DIAGNOSIS — N200 Calculus of kidney: Secondary | ICD-10-CM | POA: Diagnosis not present

## 2021-01-26 DIAGNOSIS — K8020 Calculus of gallbladder without cholecystitis without obstruction: Secondary | ICD-10-CM

## 2021-01-26 HISTORY — DX: Other specified disorders of kidney and ureter: N28.89

## 2021-01-26 MED ORDER — IPRATROPIUM-ALBUTEROL 0.5-2.5 (3) MG/3ML IN SOLN: 3.0000 mL | Freq: Four times a day (QID) | RESPIRATORY_TRACT | 12 refills | Status: AC | PRN

## 2021-01-26 NOTE — Progress Notes (Signed)
VIRTUAL VISIT Due to national recommendations of social distancing due to [place] 19, a virtual visit is felt to be most appropriate for this patient at this time.   I connected with the patient on 01/26/21 at 12:00 PM EDT by virtual telehealth platform and verified that I am speaking with the correct person using two identifiers.   I discussed the limitations, risks, security and privacy concerns of performing an evaluation and management service by  virtual telehealth platform and the availability of in person appointments. I also discussed with the patient that there may be a patient responsible charge related to this service. The patient expressed understanding and agreed to proceed.  Patient location: Home Provider Location: [facility] Participants: Eliezer Lofts and [name]   Chief Complaint  Patient presents with   Covid Exposure    Sister-in law tested positive on Tuesday.  Ms. Reule tested negative on Friday   Follow-up    ER visits 08/18 & 08/23 for dehydration    History of Present Illness:  75 year old female patient with history of GERD, COPD and fibromyalgia presents for follow up ER visits for dehydration   Reviewed notes from both in detail.  She had been having 2 weeks feeling generally unwell, initial N/V, lightheadedness. ER note stated "  Her labs do suggest that she is somewhat dehydrated, initial BMP with AKI creatinine baseline 0.8-1.45 as well as an anion gap acidosis with a bicarb of 15, I suspect this is in the setting of starvation and dehydration.  Patient however looks very well.  Her troponin is negative EKG within normal limits.  Chest x-ray does not show any infiltrate.  COVID and influenza are negative.  Patient was given a liter of fluids and BMP repeated which shows resolution of the anion gap and normalization of her bicarb. "   CT scan  of abd pelvis: showed large gallstone.. no cholecystitis  Had renal cortical scarring and large left   staghorn calculus.  She reports she has been feeling better. She is back to normal po intake, increased water and three meals a day. Wakes late so does not have a breakfast more like a brunch.  Normal UOP.  No D/C.  She is improving with energy.   She has a headache, mild scratchy throat today.  COVID 19 screen COVID testing:neg home test on 01/22/21 COVID vaccine: 07/21/2020 , 07/30/2019 , 07/05/2019 COVID exposure: No recent travel or known exposure to [place]  The importance of social distancing was discussed today.   Review of Systems  Constitutional:  Negative for chills and fever.  HENT:  Negative for congestion and ear pain.   Eyes:  Negative for pain and redness.  Respiratory:  Negative for cough and shortness of breath.   Cardiovascular:  Negative for chest pain, palpitations and leg swelling.  Gastrointestinal:  Negative for abdominal pain, blood in stool, constipation, diarrhea, nausea and vomiting.  Genitourinary:  Negative for dysuria.  Musculoskeletal:  Negative for falls and myalgias.  Skin:  Negative for rash.  Neurological:  Negative for dizziness.  Psychiatric/Behavioral:  Negative for depression. The patient is not nervous/anxious.      Past Medical History:  Diagnosis Date   Breast cancer ([place]) 12/2011   Breast cancer ([place]) XX123456   DCIS   Complication of anesthesia    hard to wake up age 71-not since   COPD (chronic obstructive pulmonary disease) ([facility])    Fibromyalgia    GERD (gastroesophageal reflux disease)  History of radiation therapy 02/29/12 -04/21/12   right breast, 5040 cGy in 28 fx, boost to total 6240 cGy   Osteoporosis    Personal history of colonic polyps-adenomas 05/25/2012   Personal history of radiation therapy    PHN (postherpetic neuralgia) 2000   Right side of face   Pneumonia 12/19/2018    reports that she has never smoked. She has been exposed to tobacco smoke. She has never used smokeless tobacco. She reports current alcohol use. She  reports that she does not use drugs.   Current Outpatient Medications:    acetaminophen (TYLENOL) 500 MG tablet, Take 1,000 mg by mouth every 4 (four) hours as needed for mild pain, moderate pain or headache. , Disp: , Rfl:    albuterol (VENTOLIN HFA) 108 (90 Base) MCG/ACT inhaler, Inhale 2 puffs into the lungs every 6 (six) hours as needed for wheezing or shortness of breath., Disp: 18 g, Rfl: 12   Cholecalciferol (VITAMIN D) 50 MCG (2000 UT) tablet, Take 2,000 Units by mouth daily., Disp: , Rfl:    DULoxetine (CYMBALTA) 30 MG capsule, Take 1 tablet every night, Disp: 30 capsule, Rfl: 11   Fluticasone-Umeclidin-Vilant (TRELEGY ELLIPTA) 100-62.5-25 MCG/INH AEPB, Inhale 1 puff then rinse mouth once daily, Disp: 60 each, Rfl: 12   gabapentin (NEURONTIN) 600 MG tablet, Take 2 tablets three times a day, Disp: 540 tablet, Rfl: 3   Multiple Vitamins-Minerals (GNP HAIR/SKIN/NAILS PO), Take by mouth daily., Disp: , Rfl:    nortriptyline (PAMELOR) 50 MG capsule, Take 3 capsules every night, Disp: 270 capsule, Rfl: 3   traMADol (ULTRAM) 50 MG tablet, TAKE 1/2 TO 1 (ONE-HALF TO ONE) TABLET BY MOUTH ONCE DAILY AS NEEDED FOR SEVERE PAIN, Disp: 30 tablet, Rfl: 5   vitamin B-12 (CYANOCOBALAMIN) 1000 MCG tablet, Take 1,000 mcg by mouth daily., Disp: , Rfl:    ipratropium-albuterol (DUONEB) 0.5-2.5 (3) MG/3ML SOLN, Take 3 mLs by nebulization every 6 (six) hours as needed., Disp: 150 mL, Rfl: 12   Observations/Objective: Blood pressure (!) 135/91, pulse (!) 101, temperature (!) 97.3 F (36.3 C), temperature source Temporal, height '4\' 11"'$  (1.499 m), weight 154 lb 6 oz (70 kg).  Physical Exam Constitutional:      General: She is not in acute distress.    Appearance: Normal appearance. She is well-developed. She is not ill-appearing or toxic-appearing.  HENT:     Head: Normocephalic.     Right Ear: Hearing, tympanic membrane, ear canal and external ear normal. Tympanic membrane is not erythematous, retracted or  bulging.     Left Ear: Hearing, tympanic membrane, ear canal and external ear normal. Tympanic membrane is not erythematous, retracted or bulging.     Nose: No mucosal edema or rhinorrhea.     Right Sinus: No maxillary sinus tenderness or frontal sinus tenderness.     Left Sinus: No maxillary sinus tenderness or frontal sinus tenderness.     Mouth/Throat:     Pharynx: Uvula midline.  Eyes:     General: Lids are normal. Lids are everted, no foreign bodies appreciated.     Conjunctiva/sclera: Conjunctivae normal.     Pupils: Pupils are equal, round, and reactive to light.  Neck:     Thyroid: No thyroid mass or thyromegaly.     Vascular: No carotid bruit.     Trachea: Trachea normal.  Cardiovascular:     Rate and Rhythm: Normal rate and regular rhythm.     Pulses: Normal pulses.     Heart sounds: Normal  heart sounds, S1 normal and S2 normal. No murmur heard.   No friction rub. No gallop.  Pulmonary:     Effort: Pulmonary effort is normal. No tachypnea or respiratory distress.     Breath sounds: Normal breath sounds. No decreased breath sounds, wheezing, rhonchi or rales.  Abdominal:     General: Bowel sounds are normal.     Palpations: Abdomen is soft.     Tenderness: There is no abdominal tenderness.  Musculoskeletal:     Cervical back: Normal range of motion and neck supple.  Skin:    General: Skin is warm and dry.     Findings: No rash.  Neurological:     Mental Status: She is alert.  Psychiatric:        Mood and Affect: Mood is not anxious or depressed.        Speech: Speech normal.        Behavior: Behavior normal. Behavior is cooperative.        Thought Content: Thought content normal.        Judgment: Judgment normal.     Assessment and Plan Problem List Items Addressed This Visit     Calculus of gallbladder without cholecystitis without obstruction    Consider referral to surgeon for large gallstone ion future as no gallbladder related pain now.      Dehydration     Push fluids, consider BMET in 1 week.      Exposure to COVID-19 virus - Primary    Check COVID test one more time. Let me know ASAP if positive. Push fluids.Marland Kitchen try increase intake.      Renal scarring   Relevant Orders   Ambulatory referral to Urology   Staghorn calculus    Referral to urology.      Relevant Orders   Ambulatory referral to Urology      I discussed the assessment and treatment plan with the patient. The patient was provided an opportunity to ask questions and all were answered. The patient agreed with the plan and demonstrated an understanding of the instructions.   The patient was advised to call back or seek an in-person evaluation if the symptoms worsen or if the condition fails to improve as anticipated.     Eliezer Lofts, MD

## 2021-01-26 NOTE — Patient Instructions (Addendum)
Check COVID test one more time. Let me know ASAP if positive. Push fluids.Marland Kitchen try increase intake.  If  not continuing to improve over next few weeks.. make an appt for lab check of BMET.  Referral  to urology placed for staghorn renal calculus.  Consider referral to surgeon for large gallstone in future as no gallbladder related pain now.

## 2021-02-10 ENCOUNTER — Encounter: Payer: Self-pay | Admitting: Urology

## 2021-02-10 ENCOUNTER — Telehealth: Payer: Self-pay

## 2021-02-10 ENCOUNTER — Ambulatory Visit: Payer: Medicare Other | Admitting: Urology

## 2021-02-10 ENCOUNTER — Other Ambulatory Visit: Payer: Self-pay

## 2021-02-10 VITALS — BP 161/98 | HR 106 | Ht 58.5 in | Wt 151.3 lb

## 2021-02-10 DIAGNOSIS — R1032 Left lower quadrant pain: Secondary | ICD-10-CM | POA: Diagnosis not present

## 2021-02-10 DIAGNOSIS — N200 Calculus of kidney: Secondary | ICD-10-CM

## 2021-02-10 LAB — URINALYSIS, COMPLETE
Bilirubin, UA: NEGATIVE
Glucose, UA: NEGATIVE
Ketones, UA: NEGATIVE
Nitrite, UA: NEGATIVE
Specific Gravity, UA: >1.03 — ABNORMAL HIGH (ref 1.005–1.030)
Urobilinogen, Ur: 0.2 mg/dL (ref 0.2–1.0)
pH, UA: 5.5 (ref 5.0–7.5)

## 2021-02-10 LAB — MICROSCOPIC EXAMINATION

## 2021-02-10 MED ORDER — POTASSIUM CITRATE ER 15 MEQ (1620 MG) PO TBCR: 2.0000 | EXTENDED_RELEASE_TABLET | Freq: Two times a day (BID) | ORAL | 11 refills | Status: DC

## 2021-02-10 NOTE — Chronic Care Management (AMB) (Addendum)
Chronic Care Management Pharmacy Assistant   Name: Katie Zimmerman  MRN: [id number] DOB: [birth year]   Reason for Encounter: General Adherence  Recent office visits:  01/26/21-PCP-telephone call-possible exposure Covid-19-Check COVID test one more time. Let me know ASAP if positive. Push fluids.Marland Kitchen try increase intake  Recent consult visits:  02/10/21-Urology-Patient presented for initial visit left partial staghorn kidney stone.Started potassium citrate 42mq twice daily return in 3-4 months 12/28/20-Pulmonology-Patient presented for follow up COPD.Script for benzonatate perles Script printed for Duoneb nebulizer solution- We will order through a home care company. You already have the machine. Follow up 6 months  Hospital visits:  01/19/21-[facility] ED- Patient presented for dehydration.Labs ordered (abnormal BMP),no admission 01/14/21-[facility] ED-Patient presented for abdominal pain,nausea.xrays show Large gallstone. Left sided staghorn calculus with cortical scarring.labs ordered  Medications: Outpatient Encounter Medications as of 02/10/2021  Medication Sig   acetaminophen (TYLENOL) 500 MG tablet Take 1,000 mg by mouth every 4 (four) hours as needed for mild pain, moderate pain or headache.    albuterol (VENTOLIN HFA) 108 (90 Base) MCG/ACT inhaler Inhale 2 puffs into the lungs every 6 (six) hours as needed for wheezing or shortness of breath.   Cholecalciferol (VITAMIN D) 50 MCG (2000 UT) tablet Take 2,000 Units by mouth daily.   DULoxetine (CYMBALTA) 30 MG capsule Take 1 tablet every night   Fluticasone-Umeclidin-Vilant (TRELEGY ELLIPTA) 100-62.5-25 MCG/INH AEPB Inhale 1 puff then rinse mouth once daily   gabapentin (NEURONTIN) 600 MG tablet Take 2 tablets three times a day   ipratropium-albuterol (DUONEB) 0.5-2.5 (3) MG/3ML SOLN Take 3 mLs by nebulization every 6 (six) hours as needed.   Multiple Vitamins-Minerals (GNP HAIR/SKIN/NAILS PO) Take by mouth daily.   nortriptyline (PAMELOR) 50 MG capsule  Take 3 capsules every night   Potassium Citrate 15 MEQ (1620 MG) TBCR Take 2 tablets by mouth in the morning and at bedtime.   traMADol (ULTRAM) 50 MG tablet TAKE 1/2 TO 1 (ONE-HALF TO ONE) TABLET BY MOUTH ONCE DAILY AS NEEDED FOR SEVERE PAIN   vitamin B-12 (CYANOCOBALAMIN) 1000 MCG tablet Take 1,000 mcg by mouth daily.   No facility-administered encounter medications on file as of 02/10/2021.   Hypertension   CMP Latest Ref Rng & Units 01/19/2021 01/19/2021 01/14/2021  Glucose 70 - 99 mg/dL 95 132(H) 117(H)  BUN 8 - 23 mg/dL '18 18 15  '$ Creatinine 0.44 - 1.00 mg/dL 1.29(H) 1.45(H) 0.85  Sodium 135 - 145 mmol/L 135 135 138  Potassium 3.5 - 5.1 mmol/L 3.5 3.6 3.7  Chloride 98 - 111 mmol/L 104 103 106  CO2 22 - 32 mmol/L 24 15(L) 25  Calcium 8.9 - 10.3 mg/dL 9.4 9.4 9.6  Total Protein 6.5 - 8.1 g/dL - - 8.0  Total Bilirubin 0.3 - 1.2 mg/dL - - 0.6  Alkaline Phos 38 - 126 U/L - - 131(H)  AST 15 - 41 U/L - - 27  ALT 0 - 44 U/L - - 43   Hypertension   CMP Latest Ref Rng & Units 01/19/2021 01/19/2021 01/14/2021  Glucose 70 - 99 mg/dL 95 132(H) 117(H)  BUN 8 - 23 mg/dL '18 18 15  '$ Creatinine 0.44 - 1.00 mg/dL 1.29(H) 1.45(H) 0.85  Sodium 135 - 145 mmol/L 135 135 138  Potassium 3.5 - 5.1 mmol/L 3.5 3.6 3.7  Chloride 98 - 111 mmol/L 104 103 106  CO2 22 - 32 mmol/L 24 15(L) 25  Calcium 8.9 - 10.3 mg/dL 9.4 9.4 9.6  Total Protein 6.5 - 8.1 g/dL - -  8.0  Total Bilirubin 0.3 - 1.2 mg/dL - - 0.6  Alkaline Phos 38 - 126 U/L - - 131(H)  AST 15 - 41 U/L - - 27  ALT 0 - 44 U/L - - 43     Contacted Katie Zimmerman on 02/15/21 for general disease state and medication adherence call.   Patient is not > 5 days past due for refill on the following medications per chart history:  [facility] Medications: Medication Name/mg Last Fill Days Supply None identified  What concerns do you have about your medications? No concerns regarding medications at this time   The patient denies side effects with her  medications.   How often do you forget or accidentally miss a dose? Never  Do you use a pillbox? Yes  Are you having any problems getting your medications from your pharmacy? No  Has the cost of your medications been a concern? No  Since last visit with CPP, the following interventions have been made:  Per Urology the patient has a large calculus in the kidney and this will have to be removed in the future, also gall stone is present.  The patient has had an ED visit since last contact. 01/19/21-[facility] ED- Patient presented for dehydration.Labs ordered (abnormal BMP),no admission 01/14/21-[facility] ED-Patient presented for abdominal pain,nausea.xrays show Large gallstone. Left sided staghorn calculus with cortical scarring.labs ordered  The patient reports the following problems with their health. The patient reports she has a kidney stone and a gall stone   she denies  concerns or questions for [name], Pharm. D at this time.   Counseled patient on:  [place] job taking medications, Importance of taking medication daily without missed doses, Benefits of adherence packaging or a pillbox, and Access to CCM team for any cost, medication or pharmacy concerns.   Care Gaps: Annual wellness visit in last year? Yes  03/31/2020 Most Recent BP reading: 161/98  106 bpm   Cardiology appointment on 04/06/21  [name], CPP notified  [name], [place] Assistant [phone]  I have reviewed the care management and care coordination activities outlined in this encounter and I am certifying that I agree with the content of this note. No further action required.  [name], PharmD Clinical Pharmacist [facility] at [facility] [phone]

## 2021-02-10 NOTE — Patient Instructions (Signed)
Take potassium citrate twice daily to try to dissolve your kidney stone.  If you are having severe left flank pain or urinary infections please call the clinic.  Follow-up in 3 months with a CT scan prior to see how the stone has changed, and if the stone is not dissolving we may need to think more about surgery

## 2021-02-10 NOTE — Progress Notes (Signed)
02/10/21 3:41 PM   [name] 11-27-1945 [id number]  CC: Left partial staghorn kidney stone  HPI: I saw Ms. Thackery today for the above issues.  She is a 75 year old female who recently presented to the ED with dehydration and on CT was found to have a left partial staghorn kidney stone.  On review of the prior imaging this has been enlarging since at least 2017.  She reportedly was previously followed at [facility] urology for kidney stones, and was told these were uric acid and she was intermittently on potassium citrate.  She denies any left-sided flank pain, and has not had any recurrent UTIs.  Urinalysis today pH 5.5, 11-30 WBCs 11-30 RBCs, few bacteria, no yeast, nitrite negative, trace leukocytes   PMH: Past Medical History:  Diagnosis Date   Breast cancer ([place]) 12/2011   Breast cancer ([place]) [id number]   DCIS   Complication of anesthesia    hard to wake up age 40-not since   COPD (chronic obstructive pulmonary disease) ([place])    Fibromyalgia    GERD (gastroesophageal reflux disease)    History of radiation therapy 02/29/12 -04/21/12   right breast, 5040 cGy in 28 fx, boost to total 6240 cGy   Osteoporosis    Personal history of colonic polyps-adenomas 05/25/2012   Personal history of radiation therapy    PHN (postherpetic neuralgia) 2000   Right side of face   Pneumonia 12/19/2018    Surgical History: Past Surgical History:  Procedure Laterality Date   APPENDECTOMY     BIOPSY BREAST  01/10/12   Right breast needle cor biopsy   BREAST BIOPSY  01/10/2012   BREAST CYST ASPIRATION  01/10/2012   BREAST LUMPECTOMY Right 01/26/2012   Right/ERPR+ DCIS, right ax snbx   FOOT SURGERY  1996   rt/lt great toes   GLAUCOMA SURGERY  1993   laser   OVARIAN CYST SURGERY  1964   TUBAL LIGATION  1976     Family History: Family History  Problem Relation Age of Onset   Hypertension Mother    Cancer Mother        LIVER cancer   Hypertension Father    Diabetes Father    Heart  failure Father    Breast cancer Sister 28       NOT hormone receptor breast cancer   Breast cancer Maternal Aunt 75   Breast cancer Cousin 68       maternal cousin   Cancer Cousin 89       ? lung cancer; maternal cousin   Colon cancer Neg Hx    Stomach cancer Neg Hx     Social History:  reports that she has never smoked. She has been exposed to tobacco smoke. She has never used smokeless tobacco. She reports current alcohol use. She reports that she does not use drugs.  Physical Exam: BP (!) 161/98 (BP Location: Left Arm, Patient Position: Sitting, Cuff Size: Large)   Pulse (!) 106   Ht 4' 10.5" (1.486 m)   Wt 151 lb 4.8 oz (68.6 kg)   BMI 31.08 kg/m    Constitutional:  Alert and oriented, No acute distress. Cardiovascular: No clubbing, cyanosis, or edema. Respiratory: Normal respiratory effort, no increased work of breathing. GI: Abdomen is soft, nontender, nondistended, no abdominal masses   Laboratory Data: Reviewed, see HPI  Pertinent Imaging: I have personally viewed and interpreted the most recent CT showing a left partial staghorn stone with no hydronephrosis, HU <450, left renal atrophy.  Assessment & Plan:   75 year old female with an asymptomatic left partial staghorn stone with some likely subsequent renal atrophy that has been present since at least 2017.  We reviewed her images today and discussed options at length including observation, attempted dissolution therapy with potassiums citrate, ureteroscopy that may require staged procedure, or percutaneous nephrolithotomy.  Risks and benefits discussed extensively.  She is very averse to any surgical procedures with her lack of symptoms, and would like to try potassiums citrate and repeat imaging in 3 to 4 months.  Return precautions were discussed extensively.  Potassiums citrate 30 mEq twice daily RTC with CT prior in 3 to 4 months to evaluate change in size of stone Return precautions discussed  extensively  Nickolas Madrid, MD 02/10/2021  [facility] [address] [place], [place] [address] [phone]

## 2021-02-18 ENCOUNTER — Other Ambulatory Visit: Payer: Medicare Other

## 2021-02-25 ENCOUNTER — Other Ambulatory Visit: Payer: Medicare Other

## 2021-02-25 DIAGNOSIS — K8020 Calculus of gallbladder without cholecystitis without obstruction: Secondary | ICD-10-CM

## 2021-02-25 HISTORY — DX: Calculus of gallbladder without cholecystitis without obstruction: K80.20

## 2021-02-25 NOTE — Assessment & Plan Note (Signed)
Consider referral to surgeon for large gallstone ion future as no gallbladder related pain now.

## 2021-02-25 NOTE — Assessment & Plan Note (Signed)
Push fluids, consider BMET in 1 week.

## 2021-02-25 NOTE — Assessment & Plan Note (Signed)
Referral to urology

## 2021-02-25 NOTE — Assessment & Plan Note (Signed)
Check COVID test one more time. Let me know ASAP if positive. Push fluids.Marland Kitchen try increase intake.

## 2021-02-26 ENCOUNTER — Other Ambulatory Visit: Payer: Medicare Other

## 2021-03-02 ENCOUNTER — Encounter: Payer: Self-pay | Admitting: Neurology

## 2021-03-02 ENCOUNTER — Other Ambulatory Visit: Payer: Self-pay

## 2021-03-02 ENCOUNTER — Telehealth (INDEPENDENT_AMBULATORY_CARE_PROVIDER_SITE_OTHER): Payer: Medicare Other | Admitting: Neurology

## 2021-03-02 VITALS — Ht 58.5 in | Wt 158.0 lb

## 2021-03-02 DIAGNOSIS — B0229 Other postherpetic nervous system involvement: Secondary | ICD-10-CM

## 2021-03-02 MED ORDER — TRAMADOL HCL 50 MG PO TABS: ORAL_TABLET | ORAL | 5 refills | Status: DC

## 2021-03-02 MED ORDER — DULOXETINE HCL 30 MG PO CPEP: ORAL_CAPSULE | ORAL | 11 refills | Status: DC

## 2021-03-02 MED ORDER — NORTRIPTYLINE HCL 50 MG PO CAPS: ORAL_CAPSULE | ORAL | 3 refills | Status: DC

## 2021-03-02 MED ORDER — GABAPENTIN 600 MG PO TABS: ORAL_TABLET | ORAL | 3 refills | Status: DC

## 2021-03-02 NOTE — Progress Notes (Signed)
Virtual Visit via Video Note The purpose of this virtual visit is to provide medical care while limiting exposure to the novel coronavirus.    Consent was obtained for video visit:  Yes.   Answered questions that patient had about telehealth interaction:  Yes.   I discussed the limitations, risks, security and privacy concerns of performing an evaluation and management service by telemedicine. I also discussed with the patient that there may be a patient responsible charge related to this service. The patient expressed understanding and agreed to proceed.  Pt location: Home Physician Location: office Name of referring provider:  [name], MD I connected with Katie Zimmerman at patients initiation/request on 03/02/2021 at  4:00 PM EDT by video enabled telemedicine application and verified that I am speaking with the correct person using two identifiers. Pt MRN:  [id number] Pt DOB:  [birth year] Video Participants:  Katie Zimmerman   History of Present Illness:  The patient had a virtual video visit on 03/02/2021. She was last seen 8 months ago for postherpetic neuralgia. MRI brain in 01/2018 showed poorly demonstrated cisternal segment of trigeminal nerve, differential diagnosis includes anomalous course, edema, developmental variant, or old injury, no abnormal enhancement. She was seen by Neurosurgery for possible surgical options but she decided to hold off. On her last visit, she continued to report significant pain and agreed to addition of Cymbalta 30mg  qhs to Gabapentin 1200mg  TID and nortriptyline 150mg  qhs. She has Tramadol 50mg  prn for flares. Since her last visit, she reports trigeminal neuralgia pain has been quiet. She took the Cymbalta regularly initially then with improvement in pain, has only been taking it as needed the past 2-3 months. She states that so far she is doing pretty good. She was sick in August and the beginning of September, she went to the ER and improved with IV hydration.  She has been increasing her fluid intake and feeling much better, stating she has gained her weight back. She denies any dizziness, focal numbness/tingling/weakness, no falls.    History on Initial Assessment 01/15/2018: This is a pleasant 75 year old right-handed woman with a history of breast cancer, presenting for postherpetic neuralgia. She had shingles affecting the right side of her face in 2000. She did well for a year or 2 with no symptoms then started having significant pain in the same distribution. She describes pain as pressure and tenderness when she washes or touches her cheek on the right side of her nose. It is painful to talk, with pain radiating from the right temporal region down the right side of her tongue. It is hard to eat and talk. Bending down seems to worsen pain. She does have a diagnosis of TMJ on the right as well. She was tried on different medications in the past few years and found gabapentin helped for a while, then stopped working. She tried Lyrica several years ago which also helped for a little while, then she saw a neurologist who increased to the dose to 200mg  but she never filled the higher dose because she developed kidney stones in 2011. Around that time, the pain was not bothering her so much, but when pain recurred, she was restarted on gabapentin which worked pretty good until recently. For the past few months, she has had a constant 10/10 pain (appears comfortable in the office today), when she has sharp pains, the pain would be more than a 10. Pain is worse at night, really bad around 9pm. She  takes her gabapentin and can sleep. The last few days she has increased gabapentin 300mg  taking 3 in AM, 2 at supper, 3 at bedtime. No drowsiness. She denies any tinnitus but feels she has lost some hearing in the right ear. No vision changes. For a time she was having headaches with pressure over the vertex, but none lately. No nausea/vomiting, dizziness, diplopia,  dysarthria/dysphagia, neck pain, focal numbness/tingling/weakness in the extremities, bowel/bladder dysfunction. Left side of face is unaffected. She has chronic back pain.      Current Outpatient Medications on File Prior to Visit  Medication Sig Dispense Refill   acetaminophen (TYLENOL) 500 MG tablet Take 1,000 mg by mouth every 4 (four) hours as needed for mild pain, moderate pain or headache.      albuterol (VENTOLIN HFA) 108 (90 Base) MCG/ACT inhaler Inhale 2 puffs into the lungs every 6 (six) hours as needed for wheezing or shortness of breath. 18 g 12   Cholecalciferol (VITAMIN D) 50 MCG (2000 UT) tablet Take 2,000 Units by mouth daily.     DULoxetine (CYMBALTA) 30 MG capsule Take 1 tablet every night 30 capsule 11   Fluticasone-Umeclidin-Vilant (TRELEGY ELLIPTA) 100-62.5-25 MCG/INH AEPB Inhale 1 puff then rinse mouth once daily 60 each 12   gabapentin (NEURONTIN) 600 MG tablet Take 2 tablets three times a day 540 tablet 3   ipratropium-albuterol (DUONEB) 0.5-2.5 (3) MG/3ML SOLN Take 3 mLs by nebulization every 6 (six) hours as needed. 150 mL 12   Multiple Vitamins-Minerals (GNP HAIR/SKIN/NAILS PO) Take by mouth daily.     nortriptyline (PAMELOR) 50 MG capsule Take 3 capsules every night 270 capsule 3   Potassium Citrate 15 MEQ (1620 MG) TBCR Take 2 tablets by mouth in the morning and at bedtime. 120 tablet 11   traMADol (ULTRAM) 50 MG tablet TAKE 1/2 TO 1 (ONE-HALF TO ONE) TABLET BY MOUTH ONCE DAILY AS NEEDED FOR SEVERE PAIN 30 tablet 5   vitamin B-12 (CYANOCOBALAMIN) 1000 MCG tablet Take 1,000 mcg by mouth daily.     No current facility-administered medications on file prior to visit.     Observations/Objective:   Vitals:   03/02/21 1507  Weight: 158 lb (71.7 kg)  Height: 4' 10.5" (1.486 m)   GEN:  The patient appears stated age and is in NAD.  Neurological examination: Patient is awake, alert. No aphasia or dysarthria. Intact fluency and comprehension. Remote and recent  memory intact. Cranial nerves: Extraocular movements intact with no nystagmus. No facial asymmetry. Motor: moves all extremities symmetrically, at least anti-gravity x 4.    Assessment and Plan:   This is a pleasant 75 yo RH woman with a history of breast cancer with right-sided post-herpetic neuralgia. On her last visit 8 months ago, she was having significant neuralgic pain on high doses of gabapentin 1200mg  TID and nortriptyline 150mg  qhs. Cymbalta 30mg  qhs was added, however she has had significant improvement in symptoms that she only takes it prn. She has prn Tramadol for severe pain. Follow-up in 8 months, she knows to call for any changes.    Follow Up Instructions:   -I discussed the assessment and treatment plan with the patient. The patient was provided an opportunity to ask questions and all were answered. The patient agreed with the plan and demonstrated an understanding of the instructions.   The patient was advised to call back or seek an in-person evaluation if the symptoms worsen or if the condition fails to improve as anticipated.    Santiago Glad  Jacquenette Shone, MD  CC: Dr. Diona Browner

## 2021-03-11 DIAGNOSIS — H10412 Chronic giant papillary conjunctivitis, left eye: Secondary | ICD-10-CM | POA: Diagnosis not present

## 2021-03-13 NOTE — Patient Instructions (Signed)
Good to see you! Continue all your medications. Follow-up in 8 months, call for any changes.

## 2021-03-18 ENCOUNTER — Telehealth: Payer: Self-pay

## 2021-03-18 NOTE — Progress Notes (Addendum)
    Chronic Care Management Pharmacy Assistant   Name: Katie Zimmerman  MRN: [id number] DOB: 20-Jul-1945   Reason for Encounter: CCM (Patient Assistance Trelegy - [place])   Attempted contact with patient on 10/19 and 10/26. Unsuccessful outreach. Patient may contact team if still interested in patient assistance.   03/24/21 - Called Patient to follow up on patient assistance for Trelegy ([place]) No answer; left voicemail Time Spent: 3 Minutes  03/17/2021 - Called Patient to follow up on patient assistance for Trelegy ([place]) No answer; left voicemail  [name], CPP notified  [name], [place] Assistant [phone]

## 2021-03-26 ENCOUNTER — Telehealth: Payer: Self-pay | Admitting: Family Medicine

## 2021-03-26 DIAGNOSIS — R7303 Prediabetes: Secondary | ICD-10-CM

## 2021-03-26 DIAGNOSIS — E7800 Pure hypercholesterolemia, unspecified: Secondary | ICD-10-CM

## 2021-03-26 NOTE — Telephone Encounter (Signed)
-----   Message from Ellamae Sia sent at 03/17/2021  2:48 PM EDT ----- Regarding: Lab orders for Tuesday, 11.1.22 Patient is scheduled for CPX labs, please order future labs, Thanks , Karna Christmas

## 2021-03-29 NOTE — Progress Notes (Signed)
Subjective:   Katie Zimmerman is a 75 y.o. female who presents for Medicare Annual (Subsequent) preventive examination.  I connected with Costella Hatcher today by telephone and verified that I am speaking with the correct person using two identifiers. Location patient: home Location provider: work Persons participating in the virtual visit: patient, Marine scientist.    I discussed the limitations, risks, security and privacy concerns of performing an evaluation and management service by telephone and the availability of in person appointments. I also discussed with the patient that there may be a patient responsible charge related to this service. The patient expressed understanding and verbally consented to this telephonic visit.    Interactive audio and video telecommunications were attempted between this provider and patient, however failed, due to patient having technical difficulties OR patient did not have access to video capability.  We continued and completed visit with audio only.  Some vital signs may be absent or patient reported.   Time Spent with patient on telephone encounter: 45 minutes   Review of Systems     Cardiac Risk Factors include: advanced age (>80men, >50 women);dyslipidemia     Objective:    Today's Vitals   03/30/21 1159  Weight: 151 lb (68.5 kg)  Height: 4\' 10"  (1.473 m)   Body mass index is 31.56 kg/m.  Advanced Directives 03/30/2021 03/02/2021 01/19/2021 01/14/2021 06/29/2020 03/06/2020 08/13/2019  Does Patient Have a Medical Advance Directive? No No No No No No No  Type of Advance Directive - - - - - - -  Copy of Healthcare Power of Attorney in Chart? - - - - - - -  Would patient like information on creating a medical advance directive? Yes (MAU/Ambulatory/Procedural Areas - Information given) - - - - No - Patient declined -    Current Medications (verified) Outpatient Encounter Medications as of 03/30/2021  Medication Sig   acetaminophen (TYLENOL) 500 MG tablet Take  1,000 mg by mouth every 4 (four) hours as needed for mild pain, moderate pain or headache.    albuterol (VENTOLIN HFA) 108 (90 Base) MCG/ACT inhaler Inhale 2 puffs into the lungs every 6 (six) hours as needed for wheezing or shortness of breath.   Cholecalciferol (VITAMIN D) 50 MCG (2000 UT) tablet Take 2,000 Units by mouth daily.   DULoxetine (CYMBALTA) 30 MG capsule Take 1 tablet every night   Fluticasone-Umeclidin-Vilant (TRELEGY ELLIPTA) 100-62.5-25 MCG/INH AEPB Inhale 1 puff then rinse mouth once daily   gabapentin (NEURONTIN) 600 MG tablet Take 2 tablets three times a day   ipratropium-albuterol (DUONEB) 0.5-2.5 (3) MG/3ML SOLN Take 3 mLs by nebulization every 6 (six) hours as needed.   Multiple Vitamins-Minerals (GNP HAIR/SKIN/NAILS PO) Take by mouth daily.   nortriptyline (PAMELOR) 50 MG capsule Take 3 capsules every night   Potassium Citrate 15 MEQ (1620 MG) TBCR Take 2 tablets by mouth in the morning and at bedtime.   traMADol (ULTRAM) 50 MG tablet TAKE 1/2 TO 1 (ONE-HALF TO ONE) TABLET BY MOUTH ONCE DAILY AS NEEDED FOR SEVERE PAIN   vitamin B-12 (CYANOCOBALAMIN) 1000 MCG tablet Take 1,000 mcg by mouth daily.   No facility-administered encounter medications on file as of 03/30/2021.    Allergies (verified) Penicillins, Venlafaxine, and Sulfonamide derivatives   History: Past Medical History:  Diagnosis Date   Breast cancer ([place]) 12/2011   Breast cancer ([place]) 24/4628   DCIS   Complication of anesthesia    hard to wake up age 64-not since   COPD (chronic obstructive pulmonary  disease) ([place])    Fibromyalgia    GERD (gastroesophageal reflux disease)    History of radiation therapy 02/29/12 -04/21/12   right breast, 5040 cGy in 28 fx, boost to total 6240 cGy   Kidney stone    Osteoporosis    Personal history of colonic polyps-adenomas 05/25/2012   Personal history of radiation therapy    PHN (postherpetic neuralgia) 2000   Right side of face   Pneumonia 12/19/2018   Past  Surgical History:  Procedure Laterality Date   APPENDECTOMY     BIOPSY BREAST  01/10/12   Right breast needle cor biopsy   BREAST BIOPSY  01/10/2012   BREAST CYST ASPIRATION  01/10/2012   BREAST LUMPECTOMY Right 01/26/2012   Right/ERPR+ DCIS, right ax snbx   FOOT SURGERY  1996   rt/lt great toes   GLAUCOMA SURGERY  1993   laser   OVARIAN CYST SURGERY  1964   TUBAL LIGATION  1976   Family History  Problem Relation Age of Onset   Hypertension Mother    Cancer Mother        LIVER cancer   Hypertension Father    Diabetes Father    Heart failure Father    Breast cancer Sister 10       NOT hormone receptor breast cancer   Breast cancer Maternal Aunt 75   Breast cancer Cousin 67       maternal cousin   Cancer Cousin 63       ? lung cancer; maternal cousin   Colon cancer Neg Hx    Stomach cancer Neg Hx    Social History   Socioeconomic History   Marital status: Widowed    Spouse name: Not on file   Number of children: 2   Years of education: Not on file   Highest education level: Not on file  Occupational History   Occupation: CONSULTANT    Employer: [employer]    Comment: Retired  Tobacco Use   Smoking status: Never    Passive exposure: Yes   Smokeless tobacco: Never   Tobacco comments:    Husband smoked  Vaping Use   Vaping Use: Never used  Substance and Sexual Activity   Alcohol use: Yes    Comment: occasional glass of wine   Drug use: No   Sexual activity: Not Currently  Other Topics Concern   Not on file  Social History Narrative   Reviewed 12/2013   Regular exercise-yes-intermittantly at [place]: fruits and veggies      Right handed      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      Lives  brother in law and his daughter and grandson  Husband passed away   Social Determinants of Health   Financial Resource Strain: Low Risk    Difficulty of Paying Living Expenses: Not hard at all  Food Insecurity: No Food Insecurity    Worried About Charity fundraiser in the Last Year: Never true   Arboriculturist in the Last Year: Never true  Transportation Needs: No Transportation Needs   Lack of Transportation (Medical): No   Lack of Transportation (Non-Medical): No  Physical Activity: Insufficiently Active   Days of Exercise per Week: 3 days   Minutes of Exercise per Session: 30 min  Stress: Not on file  Social Connections: Moderately Integrated   Frequency of Communication with Friends and Family: Three times a week  Frequency of Social Gatherings with Friends and Family: Three times a week   Attends Religious Services: More than 4 times per year   Active Member of Clubs or Organizations: Yes   Attends Archivist Meetings: 1 to 4 times per year   Marital Status: Widowed    Tobacco Counseling Counseling given: Not Answered Tobacco comments: Husband smoked   Clinical Intake:  Pre-visit preparation completed: Yes  Pain : No/denies pain     BMI - recorded: 31.08 Nutritional Status: BMI > 30  Obese Nutritional Risks: None Diabetes: No  How often do you need to have someone help you when you read instructions, pamphlets, or other written materials from your doctor or pharmacy?: 1 - Never  Diabetic?No  Interpreter Needed?: No  Information entered by :: Orrin Brigham LPN   Activities of Daily Living In your present state of health, do you have any difficulty performing the following activities: 03/30/2021  Hearing? Y  Comment hearing loss in right ear  Vision? N  Difficulty concentrating or making decisions? N  Walking or climbing stairs? N  Dressing or bathing? N  Doing errands, shopping? N  Preparing Food and eating ? N  Using the Toilet? N  In the past six months, have you accidently leaked urine? N  Do you have problems with loss of bowel control? N  Managing your Medications? N  Managing your Finances? N  Housekeeping or managing your Housekeeping? N  Some recent data might  be hidden    Patient Care Team: [name], MD as PCP - General Delice Lesch Lezlie Octave, MD as Consulting Physician (Neurology) [name], [facility] as Pharmacist (Pharmacist) Cameron Sprang, MD as Consulting Physician (Neurology)  Indicate any recent Medical Services you may have received from other than [facility] providers in the past year (date may be approximate).     Assessment:   This is a routine wellness examination for Katie Zimmerman.  Hearing/Vision screen Hearing Screening - Comments:: Hearing loss in right ear. Has hearing aids for both ears. Not currently wearing them. Vision Screening - Comments:: Last eye exam 2022, Dr. Satira Sark   Dietary issues and exercise activities discussed: Current Exercise Habits: Home exercise routine, Type of exercise: walking, Time (Minutes): 30, Frequency (Times/Week): 3, Weekly Exercise (Minutes/Week): 90, Intensity: Mild   Goals Addressed             This Visit's Progress    Patient Stated       Would like maintain drinking more water and decaffeinated drinks.        Depression Screen PHQ 2/9 Scores 03/30/2021 03/30/2021 03/27/2020 02/27/2019 02/20/2018 02/07/2017 02/04/2016  PHQ - 2 Score 0 0 0 0 0 0 0  PHQ- 9 Score - - 1 0 0 0 -    Fall Risk Fall Risk  03/30/2021 03/02/2021 06/29/2020 03/27/2020 08/13/2019  Falls in the past year? 1 1 0 0 0  Number falls in past yr: 1 1 0 - 0  Injury with Fall? 1 1 0 - 0  Risk for fall due to : Other (Comment) - - - -  Risk for fall due to: Comment patient states vertigo - - - -  Follow up Falls prevention discussed - - - -    FALL RISK PREVENTION PERTAINING TO THE HOME:  Any stairs in or around the home? No  If so, are there any without handrails?  N/A Home free of loose throw rugs in walkways, pet beds, electrical cords, etc? No  Adequate  lighting in your home to reduce risk of falls? Yes   ASSISTIVE DEVICES UTILIZED TO PREVENT FALLS:  Life alert? No  Use of a cane, walker or w/c? No  Grab bars in the  bathroom? Yes  Shower chair or bench in shower? Yes  Elevated toilet seat or a handicapped toilet? No   TIMED UP AND GO:  Was the test performed? No , visit completed over the phone.   Cognitive Function: Normal cognitive status assessed by this Nurse Health Advisor. No abnormalities found.   MMSE - Mini Mental State Exam 02/27/2019 02/20/2018 02/07/2017  Orientation to time 5 5 5   Orientation to Place 5 5 5   Registration 3 3 3   Attention/ Calculation 5 0 0  Recall 3 3 3   Language- name 2 objects - 0 0  Language- repeat 1 1 1   Language- follow 3 step command - 3 3  Language- read & follow direction - 0 0  Write a sentence - 0 0  Copy design - 0 0  Total score - 20 20        Immunizations Immunization History  Administered Date(s) Administered   Fluad Quad(high Dose 65+) 03/05/2019, 03/27/2020   Influenza Split 05/29/2012   Influenza Whole 03/07/2005   Influenza, High Dose Seasonal PF 06/21/2018   Influenza,inj,Quad PF,6+ Mos 01/20/2014, 01/22/2015, 02/04/2016, 02/07/2017   PFIZER(Purple Top)SARS-COV-2 Vaccination 07/05/2019, 07/30/2019, 07/21/2020   Pneumococcal Conjugate-13 01/20/2014   Pneumococcal Polysaccharide-23 10/21/2011   Td 07/02/1998, 03/27/2020   Zoster Recombinat (Shingrix) 04/07/2020    TDAP status: Up to date  Flu Vaccine status: Due, Education has been provided regarding the importance of this vaccine. Advised may receive this vaccine at local pharmacy or Health Dept. Aware to provide a copy of the vaccination record if obtained from local pharmacy or Health Dept. Verbalized acceptance and understanding.  Pneumococcal vaccine status: Up to date  Covid-19 vaccine status: Information provided on how to obtain vaccines.   Qualifies for Shingles Vaccine? Yes   Zostavax completed No   Shingrix Completed?: Yes  Screening Tests Health Maintenance  Topic Date Due   COLONOSCOPY (Pts 45-32yrs Insurance coverage will need to be confirmed)  05/18/2015    Zoster Vaccines- Shingrix (2 of 2) 06/02/2020   COVID-19 Vaccine (4 - Booster for Pfizer series) 09/15/2020   INFLUENZA VACCINE  12/28/2020   TETANUS/TDAP  03/27/2030   Pneumonia Vaccine 69+ Years old  Completed   DEXA SCAN  Completed   Hepatitis C Screening  Completed   HPV VACCINES  Aged Out   URINE MICROALBUMIN  Discontinued    Health Maintenance  Health Maintenance Due  Topic Date Due   COLONOSCOPY (Pts 45-82yrs Insurance coverage will need to be confirmed)  05/18/2015   Zoster Vaccines- Shingrix (2 of 2) 06/02/2020   COVID-19 Vaccine (4 - Booster for [place] series) 09/15/2020   INFLUENZA VACCINE  12/28/2020    Colorectal cancer screening: Due, last exam 05/17/12 repeat in 3 years. Patient plans on discussing with PCP  Mammogram status: Completed 09/09/20. Repeat every year  Bone Density status: Completed 06/10/19. Results reflect: Bone density results: OSTEOPOROSIS. Repeat every 2 years.  Lung Cancer Screening: (Low Dose CT Chest recommended if Age 56-80 years, 30 pack-year currently smoking OR have quit w/in 15years.) does not qualify.     Additional Screening:  Hepatitis C Screening: Completed 01/19/16  Vision Screening: Recommended annual ophthalmology exams for early detection of glaucoma and other disorders of the eye. Is the patient up to date with their  annual eye exam?  Yes , patient states last exam was in 2022 Who is the provider or what is the name of the office in which the patient attends annual eye exams? Dr. Satira Sark   Dental Screening: Recommended annual dental exams for proper oral hygiene  Community Resource Referral / Chronic Care Management: CRR required this visit?  No   CCM required this visit?  No      Plan:     I have personally reviewed and noted the following in the patient's chart:   Medical and social history Use of alcohol, tobacco or illicit drugs  Current medications and supplements including opioid prescriptions.  Functional  ability and status Nutritional status Physical activity Advanced directives List of other physicians Hospitalizations, surgeries, and ER visits in previous 12 months Vitals Screenings to include cognitive, depression, and falls Referrals and appointments  In addition, I have reviewed and discussed with patient certain preventive protocols, quality metrics, and best practice recommendations. A written personalized care plan for preventive services as well as general preventive health recommendations were provided to patient.   Due to this being a telephonic visit, the after visit summary with patients personalized plan was offered to patient via mail or my-chart. Patient would like to access on my-chart.   Loma Messing, LPN   [id number]  Nurse Health Advisor   Nurse Notes: None

## 2021-03-30 ENCOUNTER — Ambulatory Visit (INDEPENDENT_AMBULATORY_CARE_PROVIDER_SITE_OTHER): Payer: Medicare Other

## 2021-03-30 ENCOUNTER — Other Ambulatory Visit: Payer: Medicare Other

## 2021-03-30 VITALS — Ht <= 58 in | Wt 151.0 lb

## 2021-03-30 DIAGNOSIS — Z0000 Encounter for general adult medical examination without abnormal findings: Secondary | ICD-10-CM

## 2021-03-30 NOTE — Progress Notes (Deleted)
CARDIOLOGY CONSULT NOTE       Patient ID: Katie Zimmerman MRN: [id number] DOB/AGE: Sep 15, 1945 75 y.o.  Admit date: (Not on file) Referring Physician: Diona Browner Primary Physician: [name], MD Primary Cardiologist: new Reason for Consultation: Tachycardia  Active Problems:   * No active hospital problems. *   HPI:  75 y.o. with history of GERD,COPD, Breast cancer fibromyalgia. Referred by Dr Diona Browner for tachycardia She was seen in ER Seen in ED 01/19/21 with fatigue, dizziness and poor PO intake Rx saline with improvement CT 01/14/21 with left renal scarring and large staghorn calculus and large solitary gallstone COVID panel negative some azotemia Troponin negative and ESR 42 She had TTE done 11/09/17 for dyspnea and was normal EF 65-70% and mild MR  ROS All other systems reviewed and negative except as noted above  Past Medical History:  Diagnosis Date   Breast cancer ([place]) 12/2011   Breast cancer ([facility]) 11/3530   DCIS   Complication of anesthesia    hard to wake up age 12-not since   COPD (chronic obstructive pulmonary disease) ([place])    Fibromyalgia    GERD (gastroesophageal reflux disease)    History of radiation therapy 02/29/12 -04/21/12   right breast, 5040 cGy in 28 fx, boost to total 6240 cGy   Osteoporosis    Personal history of colonic polyps-adenomas 05/25/2012   Personal history of radiation therapy    PHN (postherpetic neuralgia) 2000   Right side of face   Pneumonia 12/19/2018    Family History  Problem Relation Age of Onset   Hypertension Mother    Cancer Mother        LIVER cancer   Hypertension Father    Diabetes Father    Heart failure Father    Breast cancer Sister 21       NOT hormone receptor breast cancer   Breast cancer Maternal Aunt 75   Breast cancer Cousin 19       maternal cousin   Cancer Cousin 58       ? lung cancer; maternal cousin   Colon cancer Neg Hx    Stomach cancer Neg Hx     Social History   Socioeconomic History   Marital  status: Widowed    Spouse name: Not on file   Number of children: 2   Years of education: Not on file   Highest education level: Not on file  Occupational History   Occupation: CONSULTANT    Employer: [employer]    Comment: Retired  Tobacco Use   Smoking status: Never    Passive exposure: Yes   Smokeless tobacco: Never   Tobacco comments:    Husband smoked  Vaping Use   Vaping Use: Never used  Substance and Sexual Activity   Alcohol use: Yes    Comment: occasional glass of wine   Drug use: No   Sexual activity: Not Currently  Other Topics Concern   Not on file  Social History Narrative   Reviewed 12/2013   Regular exercise-yes-intermittantly at [place]: fruits and veggies      Right handed      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      Lives  brother in law and his daughter and grandson  Husband passed away   Social Determinants of Health   Financial Resource Strain: Medium Risk   Difficulty of Paying Living Expenses: Somewhat hard  Food Insecurity: Not on file  Transportation Needs: Not on file  Physical Activity: Not on file  Stress: Not on file  Social Connections: Not on file  Intimate Partner Violence: Not on file    Past Surgical History:  Procedure Laterality Date   APPENDECTOMY     BIOPSY BREAST  01/10/12   Right breast needle cor biopsy   BREAST BIOPSY  01/10/2012   BREAST CYST ASPIRATION  01/10/2012   BREAST LUMPECTOMY Right 01/26/2012   Right/ERPR+ DCIS, right ax snbx   FOOT SURGERY  1996   rt/lt great toes   GLAUCOMA SURGERY  1993   laser   OVARIAN CYST SURGERY  1964   TUBAL LIGATION  1976      Current Outpatient Medications:    acetaminophen (TYLENOL) 500 MG tablet, Take 1,000 mg by mouth every 4 (four) hours as needed for mild pain, moderate pain or headache. , Disp: , Rfl:    albuterol (VENTOLIN HFA) 108 (90 Base) MCG/ACT inhaler, Inhale 2 puffs into the lungs every 6 (six) hours as needed for wheezing or  shortness of breath., Disp: 18 g, Rfl: 12   Cholecalciferol (VITAMIN D) 50 MCG (2000 UT) tablet, Take 2,000 Units by mouth daily., Disp: , Rfl:    DULoxetine (CYMBALTA) 30 MG capsule, Take 1 tablet every night, Disp: 30 capsule, Rfl: 11   Fluticasone-Umeclidin-Vilant (TRELEGY ELLIPTA) 100-62.5-25 MCG/INH AEPB, Inhale 1 puff then rinse mouth once daily, Disp: 60 each, Rfl: 12   gabapentin (NEURONTIN) 600 MG tablet, Take 2 tablets three times a day, Disp: 540 tablet, Rfl: 3   ipratropium-albuterol (DUONEB) 0.5-2.5 (3) MG/3ML SOLN, Take 3 mLs by nebulization every 6 (six) hours as needed., Disp: 150 mL, Rfl: 12   Multiple Vitamins-Minerals (GNP HAIR/SKIN/NAILS PO), Take by mouth daily., Disp: , Rfl:    nortriptyline (PAMELOR) 50 MG capsule, Take 3 capsules every night, Disp: 270 capsule, Rfl: 3   Potassium Citrate 15 MEQ (1620 MG) TBCR, Take 2 tablets by mouth in the morning and at bedtime., Disp: 120 tablet, Rfl: 11   traMADol (ULTRAM) 50 MG tablet, TAKE 1/2 TO 1 (ONE-HALF TO ONE) TABLET BY MOUTH ONCE DAILY AS NEEDED FOR SEVERE PAIN, Disp: 30 tablet, Rfl: 5   vitamin B-12 (CYANOCOBALAMIN) 1000 MCG tablet, Take 1,000 mcg by mouth daily., Disp: , Rfl:     Physical Exam: There were no vitals taken for this visit.    Affect appropriate Healthy:  appears stated age 37: normal Neck supple with no adenopathy JVP normal no bruits no thyromegaly Lungs clear with no wheezing and good diaphragmatic motion Heart:  S1/S2 no murmur, no rub, gallop or click PMI normal Abdomen: benighn, BS positve, no tenderness, no AAA no bruit.  No HSM or HJR Distal pulses intact with no bruits No edema Neuro non-focal Skin warm and dry No muscular weakness   Labs:   Lab Results  Component Value Date   WBC 10.2 01/19/2021   HGB 14.4 01/19/2021   HCT 43.7 01/19/2021   MCV 90.1 01/19/2021   PLT 369 01/19/2021   No results for input(s): NA, K, CL, CO2, BUN, CREATININE, CALCIUM, PROT, BILITOT, ALKPHOS, ALT,  AST, GLUCOSE in the last 168 hours.  Invalid input(s): LABALBU No results found for: CKTOTAL, CKMB, CKMBINDEX, TROPONINI  Lab Results  Component Value Date   CHOL 180 03/02/2020   CHOL 181 02/28/2019   CHOL 164 02/20/2018   Lab Results  Component Value Date   HDL 49.90 03/02/2020   HDL 52.90 02/28/2019   HDL 41.70  02/20/2018   Lab Results  Component Value Date   LDLCALC 110 (H) 03/02/2020   LDLCALC 91 02/28/2019   LDLCALC 88 02/20/2018   Lab Results  Component Value Date   TRIG 103.0 03/02/2020   TRIG 188.0 (H) 02/28/2019   TRIG 173.0 (H) 02/20/2018   Lab Results  Component Value Date   CHOLHDL 4 03/02/2020   CHOLHDL 3 02/28/2019   CHOLHDL 4 02/20/2018   Lab Results  Component Value Date   LDLDIRECT 102.0 02/20/2018   LDLDIRECT 87.0 02/07/2017   LDLDIRECT 89.0 01/19/2016      Radiology: No results found.  EKG: SR rate 89 normal 01/20/21    ASSESSMENT AND PLAN:   COPD:  sats ok in office not on home oxygen continue duoneb and trelegy and ventolin F/U with Dr Annamaria Boots  Tachycardia:  *** ID:  recent ER visit for dehydration found to have left staghorn renal calculi and large gallstone No evidence of cholecystitis or hydronephrosis *** Post herpetic Neuralgia:  right sided f/u neuro Cymbalta added slow improvement   ***  Signed: Jenkins Rouge 03/30/2021, 11:17 AM

## 2021-03-30 NOTE — Patient Instructions (Addendum)
Katie Zimmerman , Thank you for taking time to complete your Medicare Wellness Visit. I appreciate your ongoing commitment to your health goals. Please review the following plan we discussed and let me know if I can assist you in the future.   Screening recommendations/referrals: Colonoscopy: Due, last exam 05/17/12, repeat 3 years, you will discuss with PCP  Mammogram: up to date, completed 09/09/20, Due 09/09/21 Bone Density: up to date, completed 06/10/19 Due 06/09/21 Recommended yearly ophthalmology/optometry visit for glaucoma screening and checkup Recommended yearly dental visit for hygiene and checkup  Vaccinations: Influenza vaccine:Due-May obtain vaccine at our office or your local pharmacy.  Pneumococcal vaccine: up to date Tdap vaccine: up to date, completed 03/27/20, Due 03/27/30 Shingles vaccine: Up to date  Covid-19:Discuss with pharmacy   Advanced directives: information available in office  Conditions/risks identified: see problem list  Next appointment: Follow up in one year for your annual wellness visit 04/01/22 @ 3:30pm this is a telephone visit per your request.   Preventive Care 4 Years and Older, Female Preventive care refers to lifestyle choices and visits with your health care provider that can promote health and wellness. What does preventive care include? A yearly physical exam. This is also called an annual well check. Dental exams once or twice a year. Routine eye exams. Ask your health care provider how often you should have your eyes checked. Personal lifestyle choices, including: Daily care of your teeth and gums. Regular physical activity. Eating a healthy diet. Avoiding tobacco and drug use. Limiting alcohol use. Practicing safe sex. Taking low-dose aspirin every day. Taking vitamin and mineral supplements as recommended by your health care provider. What happens during an annual well check? The services and screenings done by your health care provider  during your annual well check will depend on your age, overall health, lifestyle risk factors, and family history of disease. Counseling  Your health care provider may ask you questions about your: Alcohol use. Tobacco use. Drug use. Emotional well-being. Home and relationship well-being. Sexual activity. Eating habits. History of falls. Memory and ability to understand (cognition). Work and work Statistician. Reproductive health. Screening  You may have the following tests or measurements: Height, weight, and BMI. Blood pressure. Lipid and cholesterol levels. These may be checked every 5 years, or more frequently if you are over 66 years old. Skin check. Lung cancer screening. You may have this screening every year starting at age 72 if you have a 30-pack-year history of smoking and currently smoke or have quit within the past 15 years. Fecal occult blood test (FOBT) of the stool. You may have this test every year starting at age 61. Flexible sigmoidoscopy or colonoscopy. You may have a sigmoidoscopy every 5 years or a colonoscopy every 10 years starting at age 56. Hepatitis C blood test. Hepatitis B blood test. Sexually transmitted disease (STD) testing. Diabetes screening. This is done by checking your blood sugar (glucose) after you have not eaten for a while (fasting). You may have this done every 1-3 years. Bone density scan. This is done to screen for osteoporosis. You may have this done starting at age 56. Mammogram. This may be done every 1-2 years. Talk to your health care provider about how often you should have regular mammograms. Talk with your health care provider about your test results, treatment options, and if necessary, the need for more tests. Vaccines  Your health care provider may recommend certain vaccines, such as: Influenza vaccine. This is recommended every year. Tetanus, diphtheria,  and acellular pertussis (Tdap, Td) vaccine. You may need a Td booster every  10 years. Zoster vaccine. You may need this after age 76. Pneumococcal 13-valent conjugate (PCV13) vaccine. One dose is recommended after age 76. Pneumococcal polysaccharide (PPSV23) vaccine. One dose is recommended after age 100. Talk to your health care provider about which screenings and vaccines you need and how often you need them. This information is not intended to replace advice given to you by your health care provider. Make sure you discuss any questions you have with your health care provider. Document Released: 06/12/2015 Document Revised: 02/03/2016 Document Reviewed: 03/17/2015 Elsevier Interactive Patient Education  2017 [place] Prevention in the Home Falls can cause injuries. They can happen to people of all ages. There are many things you can do to make your home safe and to help prevent falls. What can I do on the outside of my home? Regularly fix the edges of walkways and driveways and fix any cracks. Remove anything that might make you trip as you walk through a door, such as a raised step or threshold. Trim any bushes or trees on the path to your home. Use bright outdoor lighting. Clear any walking paths of anything that might make someone trip, such as rocks or tools. Regularly check to see if handrails are loose or broken. Make sure that both sides of any steps have handrails. Any raised decks and porches should have guardrails on the edges. Have any leaves, snow, or ice cleared regularly. Use sand or salt on walking paths during winter. Clean up any spills in your garage right away. This includes oil or grease spills. What can I do in the bathroom? Use night lights. Install grab bars by the toilet and in the tub and shower. Do not use towel bars as grab bars. Use non-skid mats or decals in the tub or shower. If you need to sit down in the shower, use a plastic, non-slip stool. Keep the floor dry. Clean up any water that spills on the floor as soon as it  happens. Remove soap buildup in the tub or shower regularly. Attach bath mats securely with double-sided non-slip rug tape. Do not have throw rugs and other things on the floor that can make you trip. What can I do in the bedroom? Use night lights. Make sure that you have a light by your bed that is easy to reach. Do not use any sheets or blankets that are too big for your bed. They should not hang down onto the floor. Have a firm chair that has side arms. You can use this for support while you get dressed. Do not have throw rugs and other things on the floor that can make you trip. What can I do in the kitchen? Clean up any spills right away. Avoid walking on wet floors. Keep items that you use a lot in easy-to-reach places. If you need to reach something above you, use a strong step stool that has a grab bar. Keep electrical cords out of the way. Do not use floor polish or wax that makes floors slippery. If you must use wax, use non-skid floor wax. Do not have throw rugs and other things on the floor that can make you trip. What can I do with my stairs? Do not leave any items on the stairs. Make sure that there are handrails on both sides of the stairs and use them. Fix handrails that are broken or loose. Make sure  that handrails are as long as the stairways. Check any carpeting to make sure that it is firmly attached to the stairs. Fix any carpet that is loose or worn. Avoid having throw rugs at the top or bottom of the stairs. If you do have throw rugs, attach them to the floor with carpet tape. Make sure that you have a light switch at the top of the stairs and the bottom of the stairs. If you do not have them, ask someone to add them for you. What else can I do to help prevent falls? Wear shoes that: Do not have high heels. Have rubber bottoms. Are comfortable and fit you well. Are closed at the toe. Do not wear sandals. If you use a stepladder: Make sure that it is fully opened.  Do not climb a closed stepladder. Make sure that both sides of the stepladder are locked into place. Ask someone to hold it for you, if possible. Clearly mark and make sure that you can see: Any grab bars or handrails. First and last steps. Where the edge of each step is. Use tools that help you move around (mobility aids) if they are needed. These include: Canes. Walkers. Scooters. Crutches. Turn on the lights when you go into a dark area. Replace any light bulbs as soon as they burn out. Set up your furniture so you have a clear path. Avoid moving your furniture around. If any of your floors are uneven, fix them. If there are any pets around you, be aware of where they are. Review your medicines with your doctor. Some medicines can make you feel dizzy. This can increase your chance of falling. Ask your doctor what other things that you can do to help prevent falls. This information is not intended to replace advice given to you by your health care provider. Make sure you discuss any questions you have with your health care provider. Document Released: 03/12/2009 Document Revised: 10/22/2015 Document Reviewed: 06/20/2014 Elsevier Interactive Patient Education  2017 [employer].

## 2021-04-06 ENCOUNTER — Ambulatory Visit: Payer: Medicare Other | Admitting: Cardiovascular Disease

## 2021-04-06 ENCOUNTER — Encounter: Payer: Medicare Other | Admitting: Family Medicine

## 2021-04-07 ENCOUNTER — Telehealth: Payer: Self-pay

## 2021-04-07 NOTE — Chronic Care Management (AMB) (Addendum)
Chronic Care Management Pharmacy Assistant   Name: Katie Zimmerman  MRN: [id number] DOB: Apr 22, 1946  Reason for Encounter:  Hypertension, Diabetes Review  Recent office visits:  03/30/21- Family Medicine-Telemedicine for AWV. Discussed annual screenings, vaccines up to date, needs flu shot. No medication changes.  Recent consult visits:  03/02/21-Neurology-Telemedicine-Patient presented for follow up postherpetic neuralgia. Patient is taking cymbalta as needed. Follow up 8 months  Hospital visits:  None in previous 6 months  Medications: Outpatient Encounter Medications as of 04/07/2021  Medication Sig   acetaminophen (TYLENOL) 500 MG tablet Take 1,000 mg by mouth every 4 (four) hours as needed for mild pain, moderate pain or headache.    albuterol (VENTOLIN HFA) 108 (90 Base) MCG/ACT inhaler Inhale 2 puffs into the lungs every 6 (six) hours as needed for wheezing or shortness of breath.   Cholecalciferol (VITAMIN D) 50 MCG (2000 UT) tablet Take 2,000 Units by mouth daily.   DULoxetine (CYMBALTA) 30 MG capsule Take 1 tablet every night   Fluticasone-Umeclidin-Vilant (TRELEGY ELLIPTA) 100-62.5-25 MCG/INH AEPB Inhale 1 puff then rinse mouth once daily   gabapentin (NEURONTIN) 600 MG tablet Take 2 tablets three times a day   ipratropium-albuterol (DUONEB) 0.5-2.5 (3) MG/3ML SOLN Take 3 mLs by nebulization every 6 (six) hours as needed.   Multiple Vitamins-Minerals (GNP HAIR/SKIN/NAILS PO) Take by mouth daily.   nortriptyline (PAMELOR) 50 MG capsule Take 3 capsules every night   Potassium Citrate 15 MEQ (1620 MG) TBCR Take 2 tablets by mouth in the morning and at bedtime.   traMADol (ULTRAM) 50 MG tablet TAKE 1/2 TO 1 (ONE-HALF TO ONE) TABLET BY MOUTH ONCE DAILY AS NEEDED FOR SEVERE PAIN   vitamin B-12 (CYANOCOBALAMIN) 1000 MCG tablet Take 1,000 mcg by mouth daily.   No facility-administered encounter medications on file as of 04/07/2021.    Recent Office Vitals: BP Readings from Last 3  Encounters:  02/10/21 (!) 161/98  01/26/21 (!) 135/91  01/19/21 (!) 144/78   Pulse Readings from Last 3 Encounters:  02/10/21 (!) 106  01/26/21 (!) 101  01/19/21 74    Wt Readings from Last 3 Encounters:  03/30/21 151 lb (68.5 kg)  03/02/21 158 lb (71.7 kg)  02/10/21 151 lb 4.8 oz (68.6 kg)     Kidney Function Lab Results  Component Value Date/Time   CREATININE 1.29 (H) 01/19/2021 09:30 PM   CREATININE 1.45 (H) 01/19/2021 01:19 PM   CREATININE 0.9 02/20/2014 11:33 AM   CREATININE 0.8 02/15/2013 11:29 AM   GFR 64.54 11/13/2020 01:30 PM   GFRNONAA 43 (L) 01/19/2021 09:30 PM   GFRAA 58 (L) 01/16/2019 11:20 AM    BMP Latest Ref Rng & Units 01/19/2021 01/19/2021 01/14/2021  Glucose 70 - 99 mg/dL 95 132(H) 117(H)  BUN 8 - 23 mg/dL 18 18 15   Creatinine 0.44 - 1.00 mg/dL 1.29(H) 1.45(H) 0.85  Sodium 135 - 145 mmol/L 135 135 138  Potassium 3.5 - 5.1 mmol/L 3.5 3.6 3.7  Chloride 98 - 111 mmol/L 104 103 106  CO2 22 - 32 mmol/L 24 15(L) 25  Calcium 8.9 - 10.3 mg/dL 9.4 9.4 9.6     Contacted patient on 04/15/21 to discuss hypertension disease state. Patient to call me back when she gets her cuff working correctly or gets another device. Reached out to the patient once again and the monitor still malfunctions.  Current antihypertensive regimen:  No pharmacotherpay  How often are you checking your Blood Pressure? infrequently The patient reports she has not  taken her BP. She is going to get some readings and notify when machine is working better, cuff is showing error.  Current home BP readings:  Patient states her machine is giving her an error. She states she will get this worked on and call me back when she can.  What recent interventions/DTPs have been made by any provider to improve Blood Pressure control since last CPP Visit: none identified  Any recent hospitalizations or ED visits since last visit with CPP? No  What diet changes have been made to improve Blood Pressure  Control?  Limits salt intake  What exercise is being done to improve your Blood Pressure Control?   The patient walks for exercise   Adherence Review: Is the patient currently on ACE/ARB medication? No Does the patient have >5 day gap between last estimated fill dates? No   Star Rating Drugs:  Medication:  Last Fill: Day Supply No star meds identified  Care Gaps: Annual wellness visit in last year? Yes Most Recent BP reading: 161/98  106-P  PCP appointment on 05/06/21   Recent Relevant Labs: Lab Results  Component Value Date/Time   HGBA1C 6.4 03/02/2020 12:09 PM   HGBA1C 6.0 (A) 09/03/2019 12:30 PM   HGBA1C 6.9 (H) 02/28/2019 09:16 AM   MICROALBUR 9.7 (H) 09/03/2019 12:33 PM    Kidney Function Lab Results  Component Value Date/Time   CREATININE 1.29 (H) 01/19/2021 09:30 PM   CREATININE 1.45 (H) 01/19/2021 01:19 PM   CREATININE 0.9 02/20/2014 11:33 AM   CREATININE 0.8 02/15/2013 11:29 AM   GFR 64.54 11/13/2020 01:30 PM   GFRNONAA 43 (L) 01/19/2021 09:30 PM   GFRAA 58 (L) 01/16/2019 11:20 AM     Contacted patient on 04/15/21 to discuss diabetes disease state.   Current antihyperglycemic regimen:  No pharmacotherapy   Patient verbally confirms she is taking the above medications as directed. Yes She confirms no medication regimen for DM.  What diet changes have been made to improve diabetes control? None identified  What recent interventions/DTPs have been made to improve glycemic control:   None identified  Have there been any recent hospitalizations or ED visits since last visit with CPP? No   Patient denies hypoglycemic symptoms, including Pale, Sweaty, Shaky, Hungry, Nervous/irritable, and Vision changes  Patient denies hyperglycemic symptoms, including blurry vision, excessive thirst, fatigue, polyuria, and weakness  How often are you checking your blood sugar?  She does not feel this is necessary   Are you checking your feet daily/regularly?  Yes  Adherence Review: Is the patient currently on a STATIN medication? No Is the patient currently on ACE/ARB medication? No Does the patient have >5 day gap between last estimated fill dates? No  Care Gaps: Annual wellness visit in last year? Yes Most recent A1C reading:  6.4  03/02/20 Most Recent BP reading: 161/98  106-P  02/10/21  Last eye exam / retinopathy screening:01/19/21 Last diabetic foot exam: 2020  Star Rating Drugs:  Medication:  Last Fill: Day Supply No star meds  [name], CPP notified  [name], [place] Assistant [phone]  I have reviewed the care management and care coordination activities outlined in this encounter and I am certifying that I agree with the content of this note. Pt has upcoming PCP appt and BP will be checked then. No further action required.  [name], PharmD Clinical Pharmacist [facility] at [facility] [phone]

## 2021-04-21 ENCOUNTER — Telehealth: Payer: Self-pay

## 2021-04-21 NOTE — Progress Notes (Signed)
    Chronic Care Management Pharmacy Assistant   Name: Katie Zimmerman  MRN: [id number] DOB: 1945/06/27  Reason for Encounter: CCM (Trelegy ([place]) Patient Assistance)   Patient returned my call in regards to her Trelegy Patient Assistance. Patient did not recall speaking with [name] in May in regards to applying for patient assistance. I reviewed patient assistance with the patient including out of pocket costs for prescriptions and annual income levels. Patient would like to proceed with applying. PAP form has been completed and uploaded. Patient stated she will fill out and sign the paperwork at her visit with Dr. Diona Browner on 05/06/2021. Patient was instructed to bring annual income documents and out of pocket expense receipt from pharmacy.   [name], CPP notified  [name], [place] Clinical Pharmacy Assistant [phone]  Time Spent:  78 Minutes

## 2021-04-26 NOTE — Telephone Encounter (Signed)
Application reviewed and moved to print/review folder.  Amy, Will you call specialist and ask them to fax a prescription for Trelegy to [place] Patient Assistance Program at Fax: [phone] with an office cover sheet.  Please let patient know she should fill out the application and bring this with her to see Dr. Diona Browner on 05/06/21. I think this will be easier than having it at the office when she arrives. Don't forget to bring documentation of most recent Medicare Part D prescription drug plan statement (Explanation of Benefits -EOB) indicating the patient has paid a total of $600 for prescriptions in the current calendar year and a copy of her prescription drug card (this can be requested at the office).  Prescriber: Deneise Lever, MD  [address] [place], [place] [address]  Phone:  [phone]   Fax:  [phone]  DEA #:  [id number]   NPI:  [id number]  [place], please print and mail the application to Ms. Wamble as soon as possible.  Thank you,  [name], PharmD Clinical Pharmacist [facility] at [facility] [phone]

## 2021-04-27 ENCOUNTER — Telehealth: Payer: Self-pay | Admitting: Internal Medicine

## 2021-04-27 MED ORDER — FLUTICASONE-UMECLIDIN-VILANT 100-62.5-25 MCG/ACT IN AEPB: INHALATION_SPRAY | RESPIRATORY_TRACT | 12 refills | Status: DC

## 2021-04-27 NOTE — Telephone Encounter (Signed)
RX has been printed, signed by CY and faxed to [place].   Will close encounter.

## 2021-04-27 NOTE — Telephone Encounter (Signed)
Will have my follow up with patient on 05/07/21 to see if all was turned in.

## 2021-04-27 NOTE — Progress Notes (Signed)
Called Dr. Janee Morn office and spoke with [facility]. I asked for a Trelegy prescription and gave her the fax number for [place]. Informed her that [place] requires a fax cover sheet with patient's name and date of birth on it. Muffy is going to request prescription from Dr. Annamaria Boots.  Called patient; no answer; left message informing her that the [place] patient assistance paperwork is being mailed to her house. Informed that she will need to fill it out and bring it back to her appointment on 12/08 along with out of pocket prescription costs from pharmacy and insurance card. Left my name and phone number in case the patient should have any questions.  [name], CPP notified  [name], [place] Clinical Pharmacy Assistant [phone]  Time Spent:  20 Minutes

## 2021-04-28 NOTE — Progress Notes (Signed)
Patient assistance forms for Trelegy mailed to the patient to fill out and bring to upcoming MD appointment.  [name], CPP notified  [name], [place] Assistant [phone]  Total time spent for month CPA: 10 min

## 2021-04-28 NOTE — Progress Notes (Signed)
Called patient to try and reach her again regarding her patient assitance paperwork being mailed to her house, etc. See previous note. Phone number would not work; tried several times.   [name], CPP notified  Katie Zimmerman, [place] Clinical Pharmacy Assistant [phone]  Time Spent:  5 Minutes

## 2021-05-06 ENCOUNTER — Other Ambulatory Visit: Payer: Self-pay

## 2021-05-06 ENCOUNTER — Encounter: Payer: Self-pay | Admitting: Family Medicine

## 2021-05-06 ENCOUNTER — Ambulatory Visit (INDEPENDENT_AMBULATORY_CARE_PROVIDER_SITE_OTHER): Payer: Medicare Other | Admitting: Family Medicine

## 2021-05-06 VITALS — BP 130/94 | HR 91 | Temp 98.3°F | Ht <= 58 in | Wt 154.5 lb

## 2021-05-06 DIAGNOSIS — R030 Elevated blood-pressure reading, without diagnosis of hypertension: Secondary | ICD-10-CM

## 2021-05-06 DIAGNOSIS — J449 Chronic obstructive pulmonary disease, unspecified: Secondary | ICD-10-CM

## 2021-05-06 DIAGNOSIS — Z23 Encounter for immunization: Secondary | ICD-10-CM | POA: Diagnosis not present

## 2021-05-06 DIAGNOSIS — Z0000 Encounter for general adult medical examination without abnormal findings: Secondary | ICD-10-CM

## 2021-05-06 DIAGNOSIS — R7303 Prediabetes: Secondary | ICD-10-CM

## 2021-05-06 DIAGNOSIS — Z6836 Body mass index (BMI) 36.0-36.9, adult: Secondary | ICD-10-CM

## 2021-05-06 DIAGNOSIS — E7800 Pure hypercholesterolemia, unspecified: Secondary | ICD-10-CM | POA: Diagnosis not present

## 2021-05-06 DIAGNOSIS — M810 Age-related osteoporosis without current pathological fracture: Secondary | ICD-10-CM

## 2021-05-06 NOTE — Assessment & Plan Note (Signed)
Due for re-eal.

## 2021-05-06 NOTE — Patient Instructions (Addendum)
Please stop at the lab to have labs drawn.   Follow BP at home.Marland Kitchen goal < 140/90.Marland Kitchen call with measurement in 1-2 weeks. Make sure to schedule bone density and mammogram in 08/2021.

## 2021-05-06 NOTE — Assessment & Plan Note (Signed)
Due for re-eval. 

## 2021-05-06 NOTE — Assessment & Plan Note (Signed)
Improved on Trelegy... paperwork for [employer] given to [facility] in office today.

## 2021-05-06 NOTE — Assessment & Plan Note (Signed)
Follow BP at home.Marland Kitchen goal < 140/90.Marland Kitchen call with measurement in 1-2 weeks.

## 2021-05-06 NOTE — Assessment & Plan Note (Signed)
Due for DEXA in 2023.. no currently treated as completed fosamax course.

## 2021-05-06 NOTE — Progress Notes (Signed)
Patient ID: Katie Zimmerman, female    DOB: 1946/03/10, 75 y.o.   MRN: [id number]  This visit was conducted in person.  Pulse 91   Temp 98.3 F (36.8 C) (Temporal)   Ht 4' 9.75" (1.467 m)   Wt 154 lb 8 oz (70.1 kg)   SpO2 98%   BMI 32.57 kg/m   BP Readings from Last 3 Encounters:  02/10/21 (!) 161/98  01/26/21 (!) 135/91  01/19/21 (!) 144/78     CC: Chief Complaint  Patient presents with   Annual Exam    Part 2    Subjective:   HPI: Katie Zimmerman is a 75 y.o. female presenting on 05/06/2021 for Annual Exam (Part 2)  The patient presents for annual medicare wellness, complete physical and review of chronic health problems. He/She also has the following acute concerns today: none  The patient saw a LPN or RN for medicare wellness visit.  Prevention and wellness was reviewed in detail. Note reviewed and important notes copied below.  COPD: Well controlled on trelegy. Less coughing. Using albuterol prn  Elevated Cholesterol?prediabetes: due for re-eval. Using medications without problems: Muscle aches:  Diet compliance: moderate Exercise: walking three times a wek. Other complaints:   BP high in office today, but had trouble getting in office today with truck.. will recheck here.  At home 160/91... no history of high blood pressure.     Patient dropped off  completed paperwork form [employer] for [place] for trelegy inhaler.  Relevant past medical, surgical, family and social history reviewed and updated as indicated. Interim medical history since our last visit reviewed. Allergies and medications reviewed and updated. Outpatient Medications Prior to Visit  Medication Sig Dispense Refill   acetaminophen (TYLENOL) 500 MG tablet Take 1,000 mg by mouth every 4 (four) hours as needed for mild pain, moderate pain or headache.      albuterol (VENTOLIN HFA) 108 (90 Base) MCG/ACT inhaler Inhale 2 puffs into the lungs every 6 (six) hours as needed for wheezing or shortness of  breath. 18 g 12   Cholecalciferol (VITAMIN D) 50 MCG (2000 UT) tablet Take 2,000 Units by mouth daily.     DULoxetine (CYMBALTA) 30 MG capsule Take 1 tablet every night 30 capsule 11   Fluticasone-Umeclidin-Vilant (TRELEGY ELLIPTA) 100-62.5-25 MCG/ACT AEPB Inhale 1 puff then rinse mouth once daily 60 each 12   gabapentin (NEURONTIN) 600 MG tablet Take 2 tablets three times a day 540 tablet 3   ipratropium-albuterol (DUONEB) 0.5-2.5 (3) MG/3ML SOLN Take 3 mLs by nebulization every 6 (six) hours as needed. 150 mL 12   Multiple Vitamins-Minerals (GNP HAIR/SKIN/NAILS PO) Take by mouth daily.     nortriptyline (PAMELOR) 50 MG capsule Take 3 capsules every night 270 capsule 3   Potassium Citrate 15 MEQ (1620 MG) TBCR Take 2 tablets by mouth in the morning and at bedtime. 120 tablet 11   SF 5000 PLUS 1.1 % CREA dental cream Take by mouth at bedtime.     traMADol (ULTRAM) 50 MG tablet TAKE 1/2 TO 1 (ONE-HALF TO ONE) TABLET BY MOUTH ONCE DAILY AS NEEDED FOR SEVERE PAIN 30 tablet 5   vitamin B-12 (CYANOCOBALAMIN) 1000 MCG tablet Take 1,000 mcg by mouth daily.     No facility-administered medications prior to visit.     Per HPI unless specifically indicated in ROS section below Review of Systems  Constitutional:  Negative for fatigue and fever.  HENT:  Negative for congestion.   Eyes:  Negative for pain.  Respiratory:  Negative for cough and shortness of breath.   Cardiovascular:  Negative for chest pain, palpitations and leg swelling.  Gastrointestinal:  Negative for abdominal pain.  Genitourinary:  Negative for dysuria and vaginal bleeding.  Musculoskeletal:  Negative for back pain.  Neurological:  Negative for syncope, light-headedness and headaches.  Psychiatric/Behavioral:  Negative for dysphoric mood.   Objective:  Pulse 91   Temp 98.3 F (36.8 C) (Temporal)   Ht 4' 9.75" (1.467 m)   Wt 154 lb 8 oz (70.1 kg)   SpO2 98%   BMI 32.57 kg/m   Wt Readings from Last 3 Encounters:  05/06/21  154 lb 8 oz (70.1 kg)  03/30/21 151 lb (68.5 kg)  03/02/21 158 lb (71.7 kg)      Physical Exam Vitals and nursing note reviewed.  Constitutional:      General: She is not in acute distress.    Appearance: Normal appearance. She is well-developed. She is obese. She is not ill-appearing or toxic-appearing.  HENT:     Head: Normocephalic.     Right Ear: Hearing, tympanic membrane, ear canal and external ear normal.     Left Ear: Hearing, tympanic membrane, ear canal and external ear normal.     Nose: Nose normal.  Eyes:     General: Lids are normal. Lids are everted, no foreign bodies appreciated.     Conjunctiva/sclera: Conjunctivae normal.     Pupils: Pupils are equal, round, and reactive to light.  Neck:     Thyroid: No thyroid mass or thyromegaly.     Vascular: No carotid bruit.     Trachea: Trachea normal.  Cardiovascular:     Rate and Rhythm: Normal rate and regular rhythm.     Heart sounds: Normal heart sounds, S1 normal and S2 normal. No murmur heard.   No gallop.  Pulmonary:     Effort: Pulmonary effort is normal. No respiratory distress.     Breath sounds: Normal breath sounds. No wheezing, rhonchi or rales.  Abdominal:     General: Bowel sounds are normal. There is no distension or abdominal bruit.     Palpations: Abdomen is soft. There is no fluid wave or mass.     Tenderness: There is no abdominal tenderness. There is no guarding or rebound.     Hernia: No hernia is present.  Musculoskeletal:     Cervical back: Normal range of motion and neck supple.  Lymphadenopathy:     Cervical: No cervical adenopathy.  Skin:    General: Skin is warm and dry.     Findings: No rash.  Neurological:     Mental Status: She is alert.     Cranial Nerves: No cranial nerve deficit.     Sensory: No sensory deficit.  Psychiatric:        Mood and Affect: Mood is not anxious or depressed.        Speech: Speech normal.        Behavior: Behavior normal. Behavior is cooperative.         Judgment: Judgment normal.      Results for orders placed or performed in visit on 02/10/21  Microscopic Examination   Urine  Result Value Ref Range   WBC, UA 11-30 (A) 0 - 5 /hpf   RBC 11-30 (A) 0 - 2 /hpf   Epithelial Cells (non renal) 0-10 0 - 10 /hpf   Casts Present (A) None seen /lpf   Cast Type Hyaline casts  N/A   Bacteria, UA Few None seen/Few  Urinalysis, Complete  Result Value Ref Range   Specific Gravity, UA >1.030 (H) 1.005 - 1.030   pH, UA 5.5 5.0 - 7.5   Color, UA Orange Yellow   Appearance Ur Cloudy (A) Clear   Leukocytes,UA Trace (A) Negative   Protein,UA 2+ (A) Negative/Trace   Glucose, UA Negative Negative   Ketones, UA Negative Negative   RBC, UA 2+ (A) Negative   Bilirubin, UA Negative Negative   Urobilinogen, Ur 0.2 0.2 - 1.0 mg/dL   Nitrite, UA Negative Negative   Microscopic Examination See below:     This visit occurred during the SARS-CoV-2 public health emergency.  Safety protocols were in place, including screening questions prior to the visit, additional usage of staff PPE, and extensive cleaning of exam room while observing appropriate contact time as indicated for disinfecting solutions.   COVID 19 screen:  No recent travel or known exposure to COVID19 The patient denies respiratory symptoms of COVID 19 at this time. The importance of social distancing was discussed today.   Assessment and Plan The patient's preventative maintenance and recommended screening tests for an annual wellness exam were reviewed in full today. Brought up to date unless services declined.  Counselled on the importance of diet, exercise, and its role in overall health and mortality. The patient's FH and SH was reviewed, including their home life, tobacco status, and drug and alcohol status.   Vaccines: Uptodate with pneumovax and prevnar, consider TDAP, shingles. Given HD flu shot today. 04/2012 Dr. Carlean Purl, colonoscopy 5 adenomas max 6 mm overdue  .. she wants to wait  in this. DVE/PAP: not indicated Mammo: Breast cancer s/p  Lumpectomy on right, radiation, followed by Dr. Lindi Adie, last mammo 08/2020 DEXA:09/2016; osteopenia, on tamoxifen (off NOV 2018), fosamax not needed. Repeat in 2 years.  Worsened back in osteoporosis range 2021.. due repeat in 2023  Non smoker, sig second hand some. Hearing loss in right ear. Dr. Warren Danes.  Hep C: neg   Problem List Items Addressed This Visit     Class 2 severe obesity due to excess calories with serious comorbidity and body mass index (BMI) of 36.0 to 36.9 in adult [facility])    Encouraged exercise, weight loss, healthy eating habits.       COPD (chronic obstructive pulmonary disease) ([place])     Improved on Trelegy... paperwork for [employer] given to [facility] in office today.      Elevated blood pressure reading without diagnosis of hypertension    Follow BP at home.Marland Kitchen goal < 140/90.Marland Kitchen call with measurement in 1-2 weeks.      High cholesterol    Due for re-eal.      Osteoporosis    Due for DEXA in 2023.. no currently treated as completed fosamax course.      Prediabetes    Due for re-eval.      Other Visit Diagnoses     Routine general medical examination at a health care facility    -  Primary   Need for influenza vaccination       Relevant Orders   Flu Vaccine QUAD High Dose(Fluad) (Completed)            Eliezer Lofts, MD

## 2021-05-06 NOTE — Assessment & Plan Note (Signed)
Encouraged exercise, weight loss, healthy eating habits. ? ?

## 2021-05-07 LAB — COMPREHENSIVE METABOLIC PANEL
ALT: 19 U/L (ref 0–35)
AST: 28 U/L (ref 0–37)
Albumin: 4.2 g/dL (ref 3.5–5.2)
Alkaline Phosphatase: 104 U/L (ref 39–117)
BUN: 11 mg/dL (ref 6–23)
CO2: 28 mEq/L (ref 19–32)
Calcium: 10.2 mg/dL (ref 8.4–10.5)
Chloride: 102 mEq/L (ref 96–112)
Creatinine, Ser: 1 mg/dL (ref 0.40–1.20)
GFR: 55.17 mL/min — ABNORMAL LOW (ref >60.00)
Glucose, Bld: 89 mg/dL (ref 70–99)
Potassium: 4.7 mEq/L (ref 3.5–5.1)
Sodium: 139 mEq/L (ref 135–145)
Total Bilirubin: 0.3 mg/dL (ref 0.2–1.2)
Total Protein: 7.6 g/dL (ref 6.0–8.3)

## 2021-05-07 LAB — LIPID PANEL
Cholesterol: 180 mg/dL (ref 0–200)
HDL: 59.1 mg/dL (ref >39.00)
LDL Cholesterol: 92 mg/dL (ref 0–99)
NonHDL: 120.65
Total CHOL/HDL Ratio: 3
Triglycerides: 141 mg/dL (ref 0.0–149.0)
VLDL: 28.2 mg/dL (ref 0.0–40.0)

## 2021-05-07 LAB — HEMOGLOBIN A1C: Hgb A1c MFr Bld: 6.2 % (ref 4.6–6.5)

## 2021-05-13 ENCOUNTER — Telehealth: Payer: Self-pay

## 2021-05-13 NOTE — Telephone Encounter (Addendum)
Pt turned in assistance forms for Trelegy during office visit 05/06/21. These have been reviewed. We need proof of OOP expenses of $600 for 2022 from patient before we can proceed. A faxed prescription has already been requested from Dr Annamaria Boots.   Attempted to reach pt to relay information. Left voicemail.  [name], PharmD Clinical Pharmacist Practitioner [facility] [phone]

## 2021-05-14 NOTE — Telephone Encounter (Signed)
Spoke with patient, she will bring explanation of benefits to [employer].

## 2021-05-27 ENCOUNTER — Telehealth: Payer: Self-pay

## 2021-05-27 NOTE — Progress Notes (Signed)
Chronic Care Management Pharmacy Assistant   Name: Katie Zimmerman  MRN: [id number] DOB: [birth year]  Reason for Encounter: CCM (Hypertension Disease State)   Recent office visits:  05/06/2021 - Katie Lofts, MD - Patient presented for Annual Exam. Labs: CMP, A1c and Lipid panel. Immunization administered: [employer] (high dose 65+).   Recent consult visits:  None since last CCM contact  Hospital visits:  None since last CCM contact  Medications: Outpatient Encounter Medications as of 05/27/2021  Medication Sig   acetaminophen (TYLENOL) 500 MG tablet Take 1,000 mg by mouth every 4 (four) hours as needed for mild pain, moderate pain or headache.    albuterol (VENTOLIN HFA) 108 (90 Base) MCG/ACT inhaler Inhale 2 puffs into the lungs every 6 (six) hours as needed for wheezing or shortness of breath.   Cholecalciferol (VITAMIN D) 50 MCG (2000 UT) tablet Take 2,000 Units by mouth daily.   DULoxetine (CYMBALTA) 30 MG capsule Take 1 tablet every night   Fluticasone-Umeclidin-Vilant (TRELEGY ELLIPTA) 100-62.5-25 MCG/ACT AEPB Inhale 1 puff then rinse mouth once daily   gabapentin (NEURONTIN) 600 MG tablet Take 2 tablets three times a day   ipratropium-albuterol (DUONEB) 0.5-2.5 (3) MG/3ML SOLN Take 3 mLs by nebulization every 6 (six) hours as needed.   Multiple Vitamins-Minerals (GNP HAIR/SKIN/NAILS PO) Take by mouth daily.   nortriptyline (PAMELOR) 50 MG capsule Take 3 capsules every night   Potassium Citrate 15 MEQ (1620 MG) TBCR Take 2 tablets by mouth in the morning and at bedtime.   SF 5000 PLUS 1.1 % CREA dental cream Take by mouth at bedtime.   traMADol (ULTRAM) 50 MG tablet TAKE 1/2 TO 1 (ONE-HALF TO ONE) TABLET BY MOUTH ONCE DAILY AS NEEDED FOR SEVERE PAIN   vitamin B-12 (CYANOCOBALAMIN) 1000 MCG tablet Take 1,000 mcg by mouth daily.   No facility-administered encounter medications on file as of 05/27/2021.     Recent Office Vitals: BP Readings from Last 3 Encounters:  05/06/21  (!) 130/94  02/10/21 (!) 161/98  01/26/21 (!) 135/91   Pulse Readings from Last 3 Encounters:  05/06/21 91  02/10/21 (!) 106  01/26/21 (!) 101    Wt Readings from Last 3 Encounters:  05/06/21 154 lb 8 oz (70.1 kg)  03/30/21 151 lb (68.5 kg)  03/02/21 158 lb (71.7 kg)     Kidney Function Lab Results  Component Value Date/Time   CREATININE 1.00 05/06/2021 03:46 PM   CREATININE 1.29 (H) 01/19/2021 09:30 PM   CREATININE 0.9 02/20/2014 11:33 AM   CREATININE 0.8 02/15/2013 11:29 AM   GFR 55.17 (L) 05/06/2021 03:46 PM   GFRNONAA 43 (L) 01/19/2021 09:30 PM   GFRAA 58 (L) 01/16/2019 11:20 AM    BMP Latest Ref Rng & Units 05/06/2021 01/19/2021 01/19/2021  Glucose 70 - 99 mg/dL 89 95 132(H)  BUN 6 - 23 mg/dL 11 18 18   Creatinine 0.40 - 1.20 mg/dL 1.00 1.29(H) 1.45(H)  Sodium 135 - 145 mEq/L 139 135 135  Potassium 3.5 - 5.1 mEq/L 4.7 3.5 3.6  Chloride 96 - 112 mEq/L 102 104 103  CO2 19 - 32 mEq/L 28 24 15(L)  Calcium 8.4 - 10.5 mg/dL 10.2 9.4 9.4    Attempted contact with patient on 12/29 and 12/30. Unsuccessful outreach. Will attempt again next month.   Current antihypertensive regimen:  No pharmacotherapy   Adherence Review: Is the patient currently on ACE/ARB medication? No Does the patient have >5 day gap between last estimated fill dates? N/A  Star Rating Drugs:  Medication:  Last Fill: Day Supply No star rating drugs noted  Care Gaps: Annual wellness visit in last year? No 03/27/2020 Most Recent BP reading: 130/94 on 05/06/2021  If Diabetic: Most recent A1C reading: 6.2 on 05/06/2021 Last eye exam / retinopathy screening: Up to date Last diabetic foot exam: Up to date  CCM appointment with [name] on 06/08/2021 @ 1:00 Urology appointment on 06/09/2021 Cardiology appointment on 01/018/2023  [name], CPP notified  [name], [place] Assistant [phone]

## 2021-06-03 ENCOUNTER — Telehealth: Payer: Self-pay | Admitting: Family Medicine

## 2021-06-03 ENCOUNTER — Telehealth: Payer: Self-pay

## 2021-06-03 NOTE — Telephone Encounter (Signed)
Please call patient.   Cardiology has contacted me.. it has been 6 month since referral made by Dr. Silvio Pate in 10/2020 to cardiology to eval syncope, she has appt 06/16/2021... what was the delay?  Any further syncope? Chest pain, shortness of breath, palpitations, lightheadedness? Is cardiology appt still needed?

## 2021-06-03 NOTE — Telephone Encounter (Signed)
Tried to call patient. Mailbox is full.  Unable to leave a message.

## 2021-06-03 NOTE — Chronic Care Management (AMB) (Signed)
° ° °  Chronic Care Management Pharmacy Assistant   Name: NALDA SHACKLEFORD  MRN: [id number] DOB: [birth year]  Reason for Encounter: Reminder Call   Conditions to be addressed/monitored: HLD and COPD  Medications: Outpatient Encounter Medications as of 06/03/2021  Medication Sig   acetaminophen (TYLENOL) 500 MG tablet Take 1,000 mg by mouth every 4 (four) hours as needed for mild pain, moderate pain or headache.    albuterol (VENTOLIN HFA) 108 (90 Base) MCG/ACT inhaler Inhale 2 puffs into the lungs every 6 (six) hours as needed for wheezing or shortness of breath.   Cholecalciferol (VITAMIN D) 50 MCG (2000 UT) tablet Take 2,000 Units by mouth daily.   DULoxetine (CYMBALTA) 30 MG capsule Take 1 tablet every night   Fluticasone-Umeclidin-Vilant (TRELEGY ELLIPTA) 100-62.5-25 MCG/ACT AEPB Inhale 1 puff then rinse mouth once daily   gabapentin (NEURONTIN) 600 MG tablet Take 2 tablets three times a day   ipratropium-albuterol (DUONEB) 0.5-2.5 (3) MG/3ML SOLN Take 3 mLs by nebulization every 6 (six) hours as needed.   Multiple Vitamins-Minerals (GNP HAIR/SKIN/NAILS PO) Take by mouth daily.   nortriptyline (PAMELOR) 50 MG capsule Take 3 capsules every night   Potassium Citrate 15 MEQ (1620 MG) TBCR Take 2 tablets by mouth in the morning and at bedtime.   SF 5000 PLUS 1.1 % CREA dental cream Take by mouth at bedtime.   traMADol (ULTRAM) 50 MG tablet TAKE 1/2 TO 1 (ONE-HALF TO ONE) TABLET BY MOUTH ONCE DAILY AS NEEDED FOR SEVERE PAIN   vitamin B-12 (CYANOCOBALAMIN) 1000 MCG tablet Take 1,000 mcg by mouth daily.   No facility-administered encounter medications on file as of 06/03/2021.   [name] did not answer the telephone  to remind of upcoming telephone visit with [name] on 06/08/21 at 1:00pm. Patient was reminded to have all medications, supplements and any blood glucose and blood pressure readings available for review at appointment. If unable to reach, a voicemail was left for patient.    Star  Rating Drugs: Medication:  Last Fill: Day Supply No star meds identified   [name], CPP notified  [name], [place] Assistant [phone]  Total time spent for month CPA: 10 min

## 2021-06-03 NOTE — Telephone Encounter (Signed)
-----   Message from Josue Hector, MD sent at 06/03/2021 11:06 AM EST ----- Why is cardiology seeing this patient ?

## 2021-06-03 NOTE — Progress Notes (Signed)
CARDIOLOGY CONSULT NOTE       Patient ID: CLYTIE SHETLEY MRN: [id number] DOB/AGE: [birth year] 76 y.o.  Admit date: (Not on file) Referring Physician: Diona Browner Primary Physician: [name], MD Primary Cardiologist: New Reason for Consultation: Pre Syncope   Active Problems:   * No active hospital problems. *   HPI:  76 y.o. with history of obesity, HLD, Pre DM, HTN, COPD Significant post herpetic neuralgia  on right face Rx with Cymbalta Gabapentin Nortriptyline and Tramadol  August seen in ED with dehydration and staghorn calculus as well as large gallstone Hydrated Dizziness improved ECG showed NSR rate 89 poor R wave progression CXR NAD   Dr Silvio Pate note from June was concerned about cardiogenic syncope with her dizzy spells while standing but suspect more form meds used to Rx her herpetic neuralgia   She really does not have cardiac symptoms. She has not had any pre syncope since being hydrated and w/u for staghorn calculi. She is having surgery to break up stone 1/27 in [place] She has some concern for cardiac enlargement   No chest pain Can achieve > 5 METS  I took care of her husband "Isabell Jarvis" before he started getting care at [place]. He passed After 58 years of marriage of ischemic DCM in 2020   She is living with his brother and niece   ROS All other systems reviewed and negative except as noted above  Past Medical History:  Diagnosis Date   Breast cancer ([place]) 12/2011   Breast cancer ([place]) 93/2355   DCIS   Complication of anesthesia    hard to wake up age 89-not since   COPD (chronic obstructive pulmonary disease) ([place])    Fibromyalgia    GERD (gastroesophageal reflux disease)    History of radiation therapy 02/29/12 -04/21/12   right breast, 5040 cGy in 28 fx, boost to total 6240 cGy   Kidney stone    Osteoporosis    Personal history of colonic polyps-adenomas 05/25/2012   Personal history of radiation therapy    PHN (postherpetic neuralgia) 2000   Right side of  face   Pneumonia 12/19/2018    Family History  Problem Relation Age of Onset   Hypertension Mother    Cancer Mother        LIVER cancer   Hypertension Father    Diabetes Father    Heart failure Father    Breast cancer Sister 61       NOT hormone receptor breast cancer   Breast cancer Maternal Aunt 75   Breast cancer Cousin 79       maternal cousin   Cancer Cousin 17       ? lung cancer; maternal cousin   Colon cancer Neg Hx    Stomach cancer Neg Hx     Social History   Socioeconomic History   Marital status: Widowed    Spouse name: Not on file   Number of children: 2   Years of education: Not on file   Highest education level: Not on file  Occupational History   Occupation: CONSULTANT    Employer: [employer]    Comment: Retired  Tobacco Use   Smoking status: Never    Passive exposure: Yes   Smokeless tobacco: Never   Tobacco comments:    Husband smoked  Vaping Use   Vaping Use: Never used  Substance and Sexual Activity   Alcohol use: Yes    Comment: occasional glass of wine   Drug use:  No   Sexual activity: Not Currently  Other Topics Concern   Not on file  Social History Narrative   Reviewed 12/2013   Regular exercise-yes-intermittantly at [facility] gym   Diet: fruits and veggies      Right handed      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      Lives  brother in law and his daughter and grandson  Husband passed away   Social Determinants of Health   Financial Resource Strain: Low Risk    Difficulty of Paying Living Expenses: Not very hard  Food Insecurity: No Food Insecurity   Worried About Charity fundraiser in the Last Year: Never true   Arboriculturist in the Last Year: Never true  Transportation Needs: No Transportation Needs   Lack of Transportation (Medical): No   Lack of Transportation (Non-Medical): No  Physical Activity: Insufficiently Active   Days of Exercise per Week: 3 days   Minutes of Exercise per Session: 30 min   Stress: Not on file  Social Connections: Moderately Integrated   Frequency of Communication with Friends and Family: Three times a week   Frequency of Social Gatherings with Friends and Family: Three times a week   Attends Religious Services: More than 4 times per year   Active Member of Clubs or Organizations: Yes   Attends Archivist Meetings: 1 to 4 times per year   Marital Status: Widowed  Human resources officer Violence: Not At Risk   Fear of Current or Ex-Partner: No   Emotionally Abused: No   Physically Abused: No   Sexually Abused: No    Past Surgical History:  Procedure Laterality Date   APPENDECTOMY     BIOPSY BREAST  01/10/12   Right breast needle cor biopsy   BREAST BIOPSY  01/10/2012   BREAST CYST ASPIRATION  01/10/2012   BREAST LUMPECTOMY Right 01/26/2012   Right/ERPR+ DCIS, right ax snbx   FOOT SURGERY  1996   rt/lt great toes   GLAUCOMA SURGERY  1993   laser   OVARIAN CYST SURGERY  1964   TUBAL LIGATION  1976      Current Outpatient Medications:    acetaminophen (TYLENOL) 500 MG tablet, Take 1,000 mg by mouth every 4 (four) hours as needed for mild pain, moderate pain or headache. , Disp: , Rfl:    albuterol (VENTOLIN HFA) 108 (90 Base) MCG/ACT inhaler, Inhale 2 puffs into the lungs every 6 (six) hours as needed for wheezing or shortness of breath., Disp: 18 g, Rfl: 12   Cholecalciferol (VITAMIN D) 50 MCG (2000 UT) tablet, Take 2,000 Units by mouth daily., Disp: , Rfl:    DULoxetine (CYMBALTA) 30 MG capsule, Take 1 tablet every night (Patient taking differently: Take 30 mg by mouth at bedtime as needed (neuropathy).), Disp: 30 capsule, Rfl: 11   Fluticasone-Umeclidin-Vilant (TRELEGY ELLIPTA) 100-62.5-25 MCG/ACT AEPB, Inhale 1 puff then rinse mouth once daily, Disp: 60 each, Rfl: 12   gabapentin (NEURONTIN) 600 MG tablet, Take 2 tablets three times a day, Disp: 540 tablet, Rfl: 3   ipratropium-albuterol (DUONEB) 0.5-2.5 (3) MG/3ML SOLN, Take 3 mLs by  nebulization every 6 (six) hours as needed., Disp: 150 mL, Rfl: 12   Multiple Vitamins-Minerals (GNP HAIR/SKIN/NAILS PO), Take by mouth daily., Disp: , Rfl:    nitrofurantoin, macrocrystal-monohydrate, (MACROBID) 100 MG capsule, Take 1 capsule (100 mg total) by mouth 2 (two) times daily for 7 days., Disp: 14 capsule, Rfl: 0  nortriptyline (PAMELOR) 50 MG capsule, Take 3 capsules every night, Disp: 270 capsule, Rfl: 3   Potassium Citrate 15 MEQ (1620 MG) TBCR, Take 2 tablets by mouth in the morning and at bedtime., Disp: 120 tablet, Rfl: 11   SF 5000 PLUS 1.1 % CREA dental cream, Take by mouth at bedtime., Disp: , Rfl:    traMADol (ULTRAM) 50 MG tablet, TAKE 1/2 TO 1 (ONE-HALF TO ONE) TABLET BY MOUTH ONCE DAILY AS NEEDED FOR SEVERE PAIN, Disp: 30 tablet, Rfl: 5   vitamin B-12 (CYANOCOBALAMIN) 1000 MCG tablet, Take 1,000 mcg by mouth daily., Disp: , Rfl:     Physical Exam: Blood pressure (!) 142/96, pulse 89, height 4' 10.5" (1.486 m), weight 162 lb (73.5 kg), SpO2 96 %.    Affect appropriate Healthy:  appears stated age 71: normal Neck supple with no adenopathy JVP normal no bruits no thyromegaly Lungs clear with no wheezing and good diaphragmatic motion Heart:  S1/S2 no murmur, no rub, gallop or click PMI normal Abdomen: benighn, BS positve, no tenderness, no AAA no bruit.  No HSM or HJR Distal pulses intact with no bruits No edema Neuro non-focal Skin warm and dry No muscular weakness   Labs:   Lab Results  Component Value Date   WBC 10.2 01/19/2021   HGB 14.4 01/19/2021   HCT 43.7 01/19/2021   MCV 90.1 01/19/2021   PLT 369 01/19/2021   No results for input(s): NA, K, CL, CO2, BUN, CREATININE, CALCIUM, PROT, BILITOT, ALKPHOS, ALT, AST, GLUCOSE in the last 168 hours.  Invalid input(s): LABALBU No results found for: CKTOTAL, CKMB, CKMBINDEX, TROPONINI  Lab Results  Component Value Date   CHOL 180 05/06/2021   CHOL 180 03/02/2020   CHOL 181 02/28/2019   Lab Results   Component Value Date   HDL 59.10 05/06/2021   HDL 49.90 03/02/2020   HDL 52.90 02/28/2019   Lab Results  Component Value Date   LDLCALC 92 05/06/2021   LDLCALC 110 (H) 03/02/2020   LDLCALC 91 02/28/2019   Lab Results  Component Value Date   TRIG 141.0 05/06/2021   TRIG 103.0 03/02/2020   TRIG 188.0 (H) 02/28/2019   Lab Results  Component Value Date   CHOLHDL 3 05/06/2021   CHOLHDL 4 03/02/2020   CHOLHDL 3 02/28/2019   Lab Results  Component Value Date   LDLDIRECT 102.0 02/20/2018   LDLDIRECT 87.0 02/07/2017   LDLDIRECT 89.0 01/19/2016      Radiology: CT RENAL STONE STUDY  Result Date: 06/09/2021 CLINICAL DATA:  Evaluate change in left renal stone on alkalinization therapy. EXAM: CT ABDOMEN AND PELVIS WITHOUT CONTRAST TECHNIQUE: Multidetector CT imaging of the abdomen and pelvis was performed following the standard protocol without IV contrast. RADIATION DOSE REDUCTION: This exam was performed according to the departmental dose-optimization program which includes automated exposure control, adjustment of the mA and/or kV according to patient size and/or use of iterative reconstruction technique. COMPARISON:  January 14, 2021 FINDINGS: Lower chest: Atelectasis versus scarring in the lung bases. Hepatobiliary: Unremarkable noncontrast appearance of the hepatic parenchyma. Cholelithiasis without findings of acute cholecystitis. No biliary ductal dilation. Pancreas: No pancreatic ductal dilation or evidence of acute inflammation. Spleen: Within normal limits. Adrenals/Urinary Tract: Bilateral adrenal glands are unremarkable. No hydronephrosis. Severe left renal scarring. Staghorn type left lower pole renal calculus which extends into the renal pelvis appears similar to prior. Unchanged right renal cyst. Urinary bladder is unremarkable for degree of distension. Stomach/Bowel: No enteric contrast was administered. Stomach is  decompressed limiting evaluation. No pathologic dilation of small  or large bowel. Appendix is surgically absent. Terminal ileum appears normal. No evidence of acute bowel inflammation. Vascular/Lymphatic: Scattered aortic and branch vessel atherosclerosis without abdominal aortic aneurysm. No pathologically enlarged abdominal or pelvic lymph nodes. Reproductive: Small uterine leiomyomas.  No suspicious adnexal mass. Other: No abdominopelvic free fluid. Musculoskeletal: No acute osseous abnormality. IMPRESSION: 1. Staghorn type left lower pole renal calculus which extends into the renal pelvis appears similar to prior. No hydronephrosis. 2. Cholelithiasis without findings of acute cholecystitis. 3.  Aortic Atherosclerosis (ICD10-I70.0). Electronically Signed   By: Dahlia Bailiff M.D.   On: 06/09/2021 08:13    EKG: see HPI   ASSESSMENT AND PLAN:   COPD:  no active wheezing on Trelegy CXR August NAD Post Herpetic Neuralgia:  right face f/u neurology continue Pamelor, Neurontin and Cymbalta Pre syncope:  no evidence of cardiac issue/arrhythmia More likely from age, and polypharmacy used to Rx post herpetic neuralgia will order echo  Preoperative:  ok to have urologic surgery at end of month   Echo  F/U Cardiology PRN   Signed: Jenkins Rouge 06/16/2021, 2:30 PM

## 2021-06-04 NOTE — Telephone Encounter (Signed)
Spoke with Ms. Musolino.   She states she has not had anymore episodes of Syncope but she still is having episodes of lightheadedness.  She missed her last appointment because she was at the hospital but plans on keeping appointment with Dr. Johnsie Cancel on 06/16/2021.  Patient states when she called to reschedule back in November, this was the first available appointment the cardiologist had.   Patient also wanted to let Dr. Diona Browner know that she has been checking her BP at home.   05/08/2021-138/84 03/14/2021-136/76 She as checked it more but has not recorded them because they were all running about the same.

## 2021-06-08 ENCOUNTER — Ambulatory Visit (INDEPENDENT_AMBULATORY_CARE_PROVIDER_SITE_OTHER): Payer: Medicare Other

## 2021-06-08 ENCOUNTER — Other Ambulatory Visit: Payer: Self-pay

## 2021-06-08 ENCOUNTER — Ambulatory Visit (HOSPITAL_COMMUNITY)
Admission: RE | Admit: 2021-06-08 | Discharge: 2021-06-08 | Disposition: A | Discharge status: Home / Self Care | Payer: Medicare Other | Source: Ambulatory Visit | Attending: Urology | Admitting: Urology

## 2021-06-08 VITALS — BP 136/76

## 2021-06-08 DIAGNOSIS — D259 Leiomyoma of uterus, unspecified: Secondary | ICD-10-CM | POA: Diagnosis not present

## 2021-06-08 DIAGNOSIS — R7303 Prediabetes: Secondary | ICD-10-CM

## 2021-06-08 DIAGNOSIS — R1032 Left lower quadrant pain: Secondary | ICD-10-CM | POA: Diagnosis not present

## 2021-06-08 DIAGNOSIS — E7800 Pure hypercholesterolemia, unspecified: Secondary | ICD-10-CM

## 2021-06-08 DIAGNOSIS — K8020 Calculus of gallbladder without cholecystitis without obstruction: Secondary | ICD-10-CM | POA: Diagnosis not present

## 2021-06-08 DIAGNOSIS — J449 Chronic obstructive pulmonary disease, unspecified: Secondary | ICD-10-CM

## 2021-06-08 DIAGNOSIS — N281 Cyst of kidney, acquired: Secondary | ICD-10-CM | POA: Diagnosis not present

## 2021-06-08 DIAGNOSIS — N200 Calculus of kidney: Secondary | ICD-10-CM | POA: Diagnosis not present

## 2021-06-08 DIAGNOSIS — M810 Age-related osteoporosis without current pathological fracture: Secondary | ICD-10-CM

## 2021-06-08 NOTE — Patient Instructions (Addendum)
Dear Katie Zimmerman,  Below is a summary of the goals we discussed during our follow up appointment on June 08, 2021. Please contact me anytime with questions or concerns.   Visit Information  Patient Care Plan: CCM Pharmacy Care Plan     Problem Identified: CHL AMB "PATIENT-SPECIFIC PROBLEM"      Goal: Patient Stated      Long-Range Goal: Disease Management   Start Date: 10/06/2020  This Visit's Progress: On track  Priority: High  Note:   Current Barriers:  None identified  Pharmacist Clinical Goal(s):  Patient will contact provider office for questions/concerns as evidenced notation of same in electronic health record through collaboration with PharmD and provider.   Interventions: 1:1 collaboration with [name], MD regarding development and update of comprehensive plan of care as evidenced by provider attestation and co-signature Inter-disciplinary care team collaboration (see longitudinal plan of care) Comprehensive medication review performed; medication list updated in electronic medical record  Hyperlipidemia: (LDL goal < 100) -Controlled - LDL improved to 92 (03/2021) -Current treatment: None -Medications previously tried: none  -Educated on Importance of routine exercise  -Other risk factors: BP recently elevated in clinic but has been within goal at home  05/08/2021-138/84 05/14/2021-136/76 -She had several falls last year. She plans to see cardiology for initial consult for lightheadedness/history of syncope. She denies any recent episodes of syncope, but occasional vertigo like symptoms. -Recommended continue lifestyle management  Pre-diabetes (A1c goal < 6.5%) -Controlled - A1c improved, 6.2% (03/2021) -Current medications None -Medications previously tried: none -Current home glucose readings - none -Denies hypoglycemic/hyperglycemic symptoms -Counseled continue lifestyle management  COPD (Goal: control symptoms and prevent  exacerbations) -Controlled - per patient report -Follows with pulmonology, last visit 12/28/20 for COPD with acute exacerbation; Pt has follow up scheduled for February 2023 -No tobacco history, exposed to second hand smoke  -Current treatment  Rescue:  Albuterol inhaler - 2 puffs every 6 hours Duoneb nebulizer - 3 mL every 6 hours as needed Maintenance: Trelegy 100-62.5-25 mcg - 1 puff daily  -Medications previously tried: none  -Medication cost review update - Application for Trelegy was not completed in 2022 due to we did not have the OOP expense report. She will need to spend $600 for 2023 prior to applying again. She is able to afford Trelegy at this time. She will call if any cost concerns arise.  -Exacerbations requiring treatment in last 6 months: 12/2020 - outpatient treatment with antibiotics  -Patient reports consistent use of maintenance inhaler -Frequency of rescue inhaler use: pt reports she keeps albuterol in pocketbook for emergencies, she has had to use a little more than normal lately due to cold weather -Recommended to continue follow up with pulmonology. Please call if you want to apply for Trelegy cost assistance in 2023.  Osteoporosis (Goal: Prevent falls and fractures) -Last DEXA Scan: 2020  T-Score femoral neck: n/a  T-Score total hip: n/a  T-Score lumbar spine: -2.7  T-Score forearm radius: n/a -Patient is a candidate for pharmacologic treatment due to T-Score < -2.5 in lumbar spine; Patient completed Fosamax course per PCP notes. -Current treatment  Vitamin D3 2000 IU - 1 tablet daily  -Medications previously tried: Fosamax -Recommend 1200 mg of calcium daily from dietary and supplemental sources. Recommend weight-bearing and muscle strengthening exercises for building and maintaining bone density.  -Recommended to continue current medication; Reminded patient to schedule DEXA for April 2023.  Post herpetic neuralgia (Goal: Control symptoms) -Followed by  neurology, last visit 02/2021,  next visit April 2023 -She reports symptoms are controlled are current therapy -Current treatment  Gabapentin 600 mg - 2 tablets three times per day Nortriptyline 50 mg - 3 capsules every night Duloxetine 30 mg - 1 capsule every night Tramadol 50 mg - 1/2 to 1 tablet daily as needed for severe pain Vitamin B12 1000 mcg - 1 tablet daily -Medications previously tried: none reported -Uses as needed: duloxetine (neurology aware and ok with PRN use) and tramadol  -Recommended to continue follow up with neurology routinely  Over the counter medications: Hair Skin and Nails Vitamin - daily Tylenol 500 mg - 2 tablets every 4 hours as needed for mild to moderate pain (pt takes for arthritis back pain)  Patient Goals/Self-Care Activities Patient will:  - contact CCM team with any health concerns   Follow Up Plan: Telephone follow up appointment with care management team member scheduled for: 12 months (CCM visit), CCM team will reach out in 3 months for COPD review     Patient verbalizes understanding of instructions provided today and agrees to view in [place].   [name], PharmD Clinical Pharmacist Practitioner [facility] [phone]

## 2021-06-08 NOTE — Progress Notes (Signed)
Chronic Care Management Pharmacy Note  06/08/2021 Name:  Katie Zimmerman MRN:  [id number] DOB:  1946/04/11  Summary:  CCM follow up visit. AWV with PCP last month, labs updated. Chronic conditions stable. No barriers to adherence identified. She has monitored blood pressure at home (130s/80). She has follow up scheduled with pulmonology (Feb) and neurology (April). We will hold off on Trelegy assistance until patient spends the required out of pocket expense for 2023.   Recommendations/Plan: CCM follow up 9 months COPD review in 3 months  Subjective: Katie Zimmerman is an 75 y.o. year old female who is a primary patient of Zimmerman, Katie E, MD.  The CCM team was consulted for assistance with disease management and care coordination needs.    Engaged with patient by telephone for follow up visit in response to provider referral for pharmacy case management and/or care coordination services.   Consent to Services:  The patient was given information about Chronic Care Management services, agreed to services, and gave verbal consent prior to initiation of services.  Please see initial visit note for detailed documentation.   Patient Care Team: [name], MD as PCP - General Katie Lesch Lezlie Octave, MD as Consulting Physician (Neurology) [name], [facility] as Pharmacist (Pharmacist) Cameron Sprang, MD as Consulting Physician (Neurology)  Recent office visits: 05/06/21 - Eliezer Lofts, PCP - Pt presented for routine medical exam. COPD, improved on Trelegy. Patient dropped off completed paperwork form [name] for [place] for Trelegy inhaler. Elevated blood pressure, goal < 140/90. Follow at home. Call for update in 1-2 weeks. Osteoporosis, due for DEXA. No current treatment as she completed Fosamax course. Diabetes and Cholesterol labs updated, within goal range. Flu shot given. No medication changes.  Recent consult visits: 03/02/2021 - Neurology, video visit - Pt presented for postherpetic neuralgia.  Continue current medications (gabapentin 1247m TID and nortriptyline 1578mqhs, Cymbalta 3041mhs, Tramadol severe pain). Follow up 8 months. 02/10/21 - Urology - Pt presented for staghorn calculus. Start potassium citrate 30 mEq twice daily. Repeat imagaing in 3-4 months.  Hospital visits: 01/19/21 - ED visit for dehydration. Exam and vital signs normal. Decrease PO intake and nausea. Given 1 liter of fluids. Recommend stay hydrated and follow up with PCP for repeat BMP, evaluation of renal function. 01/14/21 - ED visit for dehydration, nausea. Patient was given IVFs. CT scan was obtained and did not show clear etiology of the patients symptoms. Did show gallstone and staghorn calculus which I discussed with patient. Will plan on discharging home with prescription for nausea medication.   Objective:  Lab Results  Component Value Date   CREATININE 1.00 05/06/2021   BUN 11 05/06/2021   GFR 55.17 (L) 05/06/2021   GFRNONAA 43 (L) 01/19/2021   GFRAA 58 (L) 01/16/2019   NA 139 05/06/2021   K 4.7 05/06/2021   CALCIUM 10.2 05/06/2021   CO2 28 05/06/2021   GLUCOSE 89 05/06/2021    Lab Results  Component Value Date/Time   HGBA1C 6.2 05/06/2021 03:46 PM   HGBA1C 6.4 03/02/2020 12:09 PM   GFR 55.17 (L) 05/06/2021 03:46 PM   GFR 64.54 11/13/2020 01:30 PM   MICROALBUR 9.7 (H) 09/03/2019 12:33 PM    Last diabetic Eye exam:  Lab Results  Component Value Date/Time   HMDIABEYEEXA No Retinopathy 10/19/2018 12:00 AM    Last diabetic Foot exam:  Lab Results  Component Value Date/Time   HMDIABFOOTEX done 09/02/2019 12:00 AM     Lab Results  Component Value Date   CHOL 180 05/06/2021   HDL 59.10 05/06/2021   LDLCALC 92 05/06/2021   LDLDIRECT 102.0 02/20/2018   TRIG 141.0 05/06/2021   CHOLHDL 3 05/06/2021    Hepatic Function Latest Ref Rng & Units 05/06/2021 01/14/2021 11/13/2020  Total Protein 6.0 - 8.3 g/dL 7.6 8.0 6.6  Albumin 3.5 - 5.2 g/dL 4.2 4.1 3.9  AST 0 - 37 U/L _0 ALT 0  - 35 U/L 19 43 20  Alk Phosphatase 39 - 117 U/L 104 131(H) 111  Total Bilirubin 0.2 - 1.2 mg/dL 0.3 0.6 0.3  Bilirubin, Direct 0.0 - 0.3 mg/dL - - -    Lab Results  Component Value Date/Time   FREET4 1.14 11/13/2020 01:30 PM    CBC Latest Ref Rng & Units 01/19/2021 01/14/2021 11/13/2020  WBC 4.0 - 10.5 K/uL 10.2 9.1 9.1  Hemoglobin 12.0 - 15.0 g/dL 14.4 15.3(H) 12.9  Hematocrit 36.0 - 46.0 % 43.7 46.0 38.5  Platelets 150 - 400 K/uL 369 351 312.0    Lab Results  Component Value Date/Time   VD25OH 61.06 02/20/2018 09:03 AM   VD25OH 40.75 02/07/2017 09:04 AM    Clinical ASCVD: No  The 10-year ASCVD risk score (Arnett DK, et al., 2019) is: 29.3%   Values used to calculate the score:     Age: 50 years     Sex: Female     Is Non-Hispanic African American: No     Diabetic: Yes     Tobacco smoker: No     Systolic Blood Pressure: 503 mmHg     Is BP treated: No     HDL Cholesterol: 59.1 mg/dL     Total Cholesterol: 180 mg/dL    Depression screen [facility] 2/9 05/06/2021 03/30/2021 03/30/2021  Decreased Interest 0 0 0  Down, Depressed, Hopeless 0 0 0  PHQ - 2 Score 0 0 0  Altered sleeping 0 - -  Tired, decreased energy 0 - -  Change in appetite 0 - -  Feeling bad or failure about yourself  0 - -  Trouble concentrating 0 - -  Moving slowly or fidgety/restless 0 - -  Suicidal thoughts 0 - -  PHQ-9 Score 0 - -  Difficult doing work/chores Not difficult at all - -  Some recent data might be hidden    Social History   Tobacco Use  Smoking Status Never   Passive exposure: Yes  Smokeless Tobacco Never  Tobacco Comments   Husband smoked   BP Readings from Last 3 Encounters:  05/06/21 (!) 130/94  02/10/21 (!) 161/98  01/26/21 (!) 135/91   Pulse Readings from Last 3 Encounters:  05/06/21 91  02/10/21 (!) 106  01/26/21 (!) 101   Wt Readings from Last 3 Encounters:  05/06/21 154 lb 8 oz (70.1 kg)  03/30/21 151 lb (68.5 kg)  03/02/21 158 lb (71.7 kg)   BMI Readings from Last 3  Encounters:  05/06/21 32.57 kg/m  03/30/21 31.56 kg/m  03/02/21 32.46 kg/m    Assessment/Interventions: Review of patient past medical history, allergies, medications, health status, including review of consultants reports, laboratory and other test data, was performed as part of comprehensive evaluation and provision of chronic care management services.   SDOH:  (Social Determinants of Health) assessments and interventions performed: Yes SDOH Interventions    Flowsheet Row Most Recent Value  SDOH Interventions   Financial Strain Interventions Intervention Not Indicated  [Able to afford Trelegy at this time]  SDOH Screenings   Alcohol Screen: Low Risk    Last Alcohol Screening Score (AUDIT): 0  Depression (PHQ2-9): Low Risk    PHQ-2 Score: 0  Financial Resource Strain: Low Risk    Difficulty of Paying Living Expenses: Not very hard  Food Insecurity: No Food Insecurity   Worried About Charity fundraiser in the Last Year: Never true   Ran Out of Food in the Last Year: Never true  Housing: Low Risk    Last Housing Risk Score: 0  Physical Activity: Insufficiently Active   Days of Exercise per Week: 3 days   Minutes of Exercise per Session: 30 min  Social Connections: Moderately Integrated   Frequency of Communication with Friends and Family: Three times a week   Frequency of Social Gatherings with Friends and Family: Three times a week   Attends Religious Services: More than 4 times per year   Active Member of Clubs or Organizations: Yes   Attends Archivist Meetings: 1 to 4 times per year   Marital Status: Widowed  Stress: Not on file  Tobacco Use: Medium Risk   Smoking Tobacco Use: Never   Smokeless Tobacco Use: Never   Passive Exposure: Yes  Transportation Needs: No Transportation Needs   Lack of Transportation (Medical): No   Lack of Transportation (Non-Medical): No    CCM Care Plan  Allergies  Allergen Reactions   Penicillins Anaphylaxis,  Rash and Other (See Comments)    Has patient had a PCN reaction causing immediate rash, facial/tongue/throat swelling, SOB or lightheadedness with hypotension: Yes Has patient had a PCN reaction causing severe rash involving mucus membranes or skin necrosis: No Has patient had a PCN reaction that required hospitalization No Has patient had a PCN reaction occurring within the last 10 years: No If all of the above answers are "NO", then may proceed with Cephalosporin use.   Venlafaxine Nausea Only   Sulfonamide Derivatives Rash    Medications Reviewed Today     Reviewed by Carter Kitten, CMA (Certified Medical Assistant) on 05/06/21 at 1501  Med List Status: <None>   Medication Order Taking? Sig Documenting Provider Last Dose Status Informant  acetaminophen (TYLENOL) 500 MG tablet [id number]  Take 1,000 mg by mouth every 4 (four) hours as needed for mild pain, moderate pain or headache.  [provider]  Active Self  albuterol (VENTOLIN HFA) 108 (90 Base) MCG/ACT inhaler [id number]  Inhale 2 puffs into the lungs every 6 (six) hours as needed for wheezing or shortness of breath. Baird Lyons D, MD  Active   Cholecalciferol (VITAMIN D) 50 MCG (2000 UT) tablet [id number]  Take 2,000 Units by mouth daily. [provider]  Active   DULoxetine (CYMBALTA) 30 MG capsule [id number]  Take 1 tablet every night Cameron Sprang, MD  Active   Fluticasone-Umeclidin-[facility] ELLIPTA) 100-62.5-25 MCG/ACT AEPB [id number]  Inhale 1 puff then rinse mouth once daily Baird Lyons D, MD  Active   gabapentin (NEURONTIN) 600 MG tablet [id number]  Take 2 tablets three times a day Cameron Sprang, MD  Active   ipratropium-albuterol (DUONEB) 0.5-2.5 (3) MG/3ML SOLN [id number]  Take 3 mLs by nebulization every 6 (six) hours as needed. [name], MD  Active   Multiple Vitamins-Minerals (GNP HAIR/SKIN/NAILS PO) [id number]  Take by mouth daily. [provider]  Active   nortriptyline  (PAMELOR) 50 MG capsule [id number]  Take 3 capsules every night Cameron Sprang, MD  Active   Potassium Citrate  15 MEQ (1620 MG) TBCR [id number]  Take 2 tablets by mouth in the morning and at bedtime. Billey Co, MD  Active   traMADol (ULTRAM) 50 MG tablet [id number]  TAKE 1/2 TO 1 (ONE-HALF TO ONE) TABLET BY MOUTH ONCE DAILY AS NEEDED FOR SEVERE PAIN Cameron Sprang, MD  Active   vitamin B-12 (CYANOCOBALAMIN) 1000 MCG tablet [id number]  Take 1,000 mcg by mouth daily. [provider]  Active             Patient Active Problem List   Diagnosis Date Noted   Calculus of gallbladder without cholecystitis without obstruction 02/25/2021   Dehydration 01/26/2021   Exposure to COVID-19 virus 01/26/2021   Renal scarring 01/26/2021   Syncope 11/13/2020   Laceration of left hand without foreign body 09/30/2020   Nipple discharge 09/30/2020   Acute pain of right shoulder 09/30/2020   Fall 08/28/2020   Purpura ([place]) 11/12/2019   Situational anxiety 11/12/2019   Grieving 09/03/2019   Elevated blood pressure reading without diagnosis of hypertension 04/06/2019   Leukocytosis 01/01/2019   Cough, persistent 10/17/2017   Headache 02/21/2017   COPD (chronic obstructive pulmonary disease) ([place]) 12/30/2016   Class 2 severe obesity due to excess calories with serious comorbidity and body mass index (BMI) of 36.0 to 36.9 in adult ([place]) 08/09/2016   Increased endometrial stripe thickness 03/18/2016   CMC arthritis 01/22/2015   Counseling regarding end of life decision making 01/22/2015   Osteoporosis 01/28/2014   Hearing loss in right ear 01/20/2014   Personal history of colonic polyps-adenomas 05/25/2012   GERD (gastroesophageal reflux disease)    History of radiation therapy    Cancer of right breast, stage 0 01/26/2012   Prediabetes 11/22/2011   High cholesterol 11/22/2011   TEMPOROMANDIBULAR JOINT PAIN 11/04/2009   Staghorn calculus 05/04/2009   Post herpetic neuralgia 04/14/2009     Immunization History  Administered Date(s) Administered   Fluad Quad(high Dose 65+) 03/05/2019, 03/27/2020, 05/06/2021   Influenza Split 05/29/2012   Influenza Whole 03/07/2005   Influenza, High Dose Seasonal PF 06/21/2018   Influenza,inj,Quad PF,6+ Mos 01/20/2014, 01/22/2015, 02/04/2016, 02/07/2017   PFIZER(Purple Top)SARS-COV-2 Vaccination 07/05/2019, 07/30/2019, 07/21/2020   Pneumococcal Conjugate-13 01/20/2014   Pneumococcal Polysaccharide-23 10/21/2011   Td 07/02/1998, 03/27/2020   Zoster Recombinat (Shingrix) 04/07/2020    Conditions to be addressed/monitored:  Hyperlipidemia, COPD, Osteoporosis, and Pre-Diabetes  Care Plan : [place]  Updates made by [name], [place] since 06/08/2021 12:00 AM     Problem: CHL AMB "PATIENT-SPECIFIC PROBLEM"      Long-Range Goal: Disease Management   Start Date: 10/06/2020  This Visit's Progress: On track  Priority: High  Note:   Current Barriers:  None identified  Pharmacist Clinical Goal(s):  Patient will contact provider office for questions/concerns as evidenced notation of same in electronic health record through collaboration with PharmD and provider.   Interventions: 1:1 collaboration with [name], MD regarding development and update of comprehensive plan of care as evidenced by provider attestation and co-signature Inter-disciplinary care team collaboration (see longitudinal plan of care) Comprehensive medication review performed; medication list updated in electronic medical record  Hyperlipidemia: (LDL goal < 100) -Controlled - LDL improved to 92 (03/2021) -Current treatment: None -Medications previously tried: none  -Educated on Importance of routine exercise  -Other risk factors: BP recently elevated in clinic but has been within goal at home  05/08/2021-138/84 05/14/2021-136/76 -She had several falls last year. She plans to see cardiology for initial consult  for lightheadedness/history of  syncope. She denies any recent episodes of syncope, but occasional vertigo like symptoms. -Recommended continue lifestyle management  Pre-diabetes (A1c goal < 6.5%) -Controlled - A1c improved, 6.2% (03/2021) -Current medications None -Medications previously tried: none -Current home glucose readings - none -Denies hypoglycemic/hyperglycemic symptoms -Counseled continue lifestyle management  COPD (Goal: control symptoms and prevent exacerbations) -Controlled - per patient report -Follows with pulmonology, last visit 12/28/20 for COPD with acute exacerbation; Pt has follow up scheduled for February 2023 -No tobacco history, exposed to second hand smoke  -Current treatment  Rescue:  Albuterol inhaler - 2 puffs every 6 hours Duoneb nebulizer - 3 mL every 6 hours as needed Maintenance: Trelegy 100-62.5-25 mcg - 1 puff daily  -Medications previously tried: none  -Medication cost review update - Application for Trelegy was not completed in 2022 due to we did not have the OOP expense report. She will need to spend $600 for 2023 prior to applying again. She is able to afford Trelegy at this time. She will call if any cost concerns arise.  -Exacerbations requiring treatment in last 6 months: 12/2020 - outpatient treatment with antibiotics  -Patient reports consistent use of maintenance inhaler -Frequency of rescue inhaler use: pt reports she keeps albuterol in pocketbook for emergencies, she has had to use a little more than normal lately due to cold weather -Recommended to continue follow up with pulmonology. Please call if you want to apply for Trelegy cost assistance in 2023.  Osteoporosis (Goal: Prevent falls and fractures) -Last DEXA Scan: 2020  T-Score femoral neck: n/a  T-Score total hip: n/a  T-Score lumbar spine: -2.7  T-Score forearm radius: n/a -Patient is a candidate for pharmacologic treatment due to T-Score < -2.5 in lumbar spine; Patient completed Fosamax course per PCP  notes. -Current treatment  Vitamin D3 2000 IU - 1 tablet daily  -Medications previously tried: Fosamax -Recommend 1200 mg of calcium daily from dietary and supplemental sources. Recommend weight-bearing and muscle strengthening exercises for building and maintaining bone density.  -Recommended to continue current medication; Reminded patient to schedule DEXA for April 2023.  Post herpetic neuralgia (Goal: Control symptoms) -Followed by neurology, last visit 02/2021, next visit April 2023 -She reports symptoms are controlled are current therapy -Current treatment  Gabapentin 600 mg - 2 tablets three times per day Nortriptyline 50 mg - 3 capsules every night Duloxetine 30 mg - 1 capsule every night Tramadol 50 mg - 1/2 to 1 tablet daily as needed for severe pain Vitamin B12 1000 mcg - 1 tablet daily -Medications previously tried: none reported -Uses as needed: duloxetine (neurology aware and ok with PRN use) and tramadol  -Recommended to continue follow up with neurology routinely  Over the counter medications: Hair Skin and Nails Vitamin - daily Tylenol 500 mg - 2 tablets every 4 hours as needed for mild to moderate pain (pt takes for arthritis back pain)  Patient Goals/Self-Care Activities Patient will:  - contact CCM team with any health concerns   Follow Up Plan: Telephone follow up appointment with care management team member scheduled for: 12 months (CCM visit), CCM team will reach out in 3 months for COPD review     Care Gaps: Colonoscopy - due, discussed with patient DEXA Scan - due, discussed with patient  Star Rating Drugs: Medication:                Last Fill:         Day Supply No star meds identified  Medication Assistance: None required.  Patient affirms current coverage meets needs. Application for Trelegy was not completed in 2022 due to we did not have the expense report. She will need to spend $600 for 2023 prior to applying again. She is able to afford Trelegy  at this time. She will call if any cost concerns arise.   Patient's preferred pharmacy is: [facility] [address] [place] [address] Phone: [phone] Fax: [phone]  Uses pill box? Yes Pt endorses 100% compliance  Care Plan and Follow Up Patient Decision:  Patient agrees to Care Plan and Follow-up.  [name], PharmD Clinical Pharmacist [facility] [phone]

## 2021-06-08 NOTE — Progress Notes (Signed)
Encounter details: CCM Time Spent       Value Time User   Time spent with patient (minutes)  30 06/08/2021  1:48 PM [name], [facility]   Time spent performing Chart review  20 06/08/2021  1:48 PM [name], [facility]   Total time (minutes)  50 06/08/2021  1:48 PM [name], RPH      Moderate to High Complex Decision Making       Value Time User   Moderate to High complex decision making  Yes 06/08/2021  1:48 PM [name], [facility]      CCM Services: This encounter meets complex CCM services and moderate to high decision making.  Prior to outreach and patient consent for Chronic Care Management, I referred this patient for services after reviewing the nominated patient list or from a personal encounter with the patient.  I have personally reviewed this encounter including the documentation in this note and have collaborated with the care management provider regarding care management and care coordination activities to include development and update of the comprehensive care plan. I am certifying that I agree with the content of this note and encounter as supervising physician.

## 2021-06-09 ENCOUNTER — Ambulatory Visit: Payer: Medicare Other | Admitting: Urology

## 2021-06-09 ENCOUNTER — Encounter: Payer: Self-pay | Admitting: Urology

## 2021-06-09 ENCOUNTER — Other Ambulatory Visit: Payer: Self-pay | Admitting: Urology

## 2021-06-09 VITALS — BP 154/84 | HR 91 | Ht 58.5 in | Wt 160.0 lb

## 2021-06-09 DIAGNOSIS — N200 Calculus of kidney: Secondary | ICD-10-CM

## 2021-06-09 NOTE — Progress Notes (Signed)
° °  06/09/2021 4:01 PM   Katie Zimmerman [birth year] [id number]  Reason for visit: Follow up left partial staghorn kidney stone  HPI: 76 year old female who was incidentally found to have a left partial staghorn lower pole kidney stone on CT in August 2022.  She had a history of uric acid stones and was previously followed at [facility] urology, and at our last visit she opted for a trial of potassium citrate for dissolution.  She is asymptomatic with no flank pain or recurrent UTIs.  I personally viewed and interpreted her most recent CT dated 06/08/2021 that shows no significant change in the size of the left lower pole stone that measures approximately 2.2 cm in largest dimension.  We reviewed these results at length today and discussed options moving forward including observation, ureteroscopy and laser lithotripsy with potential need for staged procedure, or percutaneous nephrolithotomy.  We discussed various treatment options for urolithiasis including observation with or without medical expulsive therapy, shockwave lithotripsy (SWL), ureteroscopy and laser lithotripsy with stent placement, and percutaneous nephrolithotomy.  We discussed that management is based on stone size, location, density, patient co-morbidities, and patient preference.   Ureteroscopy with laser lithotripsy and stent placement has a higher stone free rate than SWL in a single procedure, however increased complication rate including possible infection, ureteral injury, bleeding, and stent related morbidity. Common stent related symptoms include dysuria, urgency/frequency, and flank pain.  PCNL is the favored treatment for stones >2cm. It involves a small incision in the flank, with complete fragmentation of stones and removal. It has the highest stone free rate, but also the highest complication rate. Possible complications include bleeding, infection/sepsis, injury to surrounding organs including the pleura, and collecting  system injury.   Her stone is right on the border of being able to may managed with ureteroscopy versus PCNL, and she is very motivated to avoid PCNL and understands the potential need for staged ureteroscopy.  I think this is very reasonable with the lower complication rate.  Schedule left ureteroscopy, laser lithotripsy, stent placement Urinalysis and culture today for preop  Billey Co, MD  [facility] [address], [place], [place] [address] [phone]

## 2021-06-09 NOTE — Patient Instructions (Signed)
Laser Therapy for Kidney Stones Laser therapy for kidney stones is a procedure to break up small, hard mineral deposits that form in the kidney (kidney stones). The procedure is done using a device that produces a focused beam of light (laser). The laser breaks up kidney stones into pieces that are small enough to be passed out of the body through urination or removed from the body during the procedure. You may need laser therapy if you have kidney stones that are painful or block your urinary tract. This procedure is done by inserting a tube (ureteroscope) into your kidney through the urethral opening. The urethra is the part of the body that drains urine from the bladder. In women, the urethra opens above the vaginal opening. In men, the urethra opens at the tip of the penis. The ureteroscope is inserted through the urethra, and surgical instruments are moved through the bladder and the muscular tube that connects the kidney to the bladder (ureter) until they reach the kidney. Tell a health care provider about: Any allergies you have. All medicines you are taking, including vitamins, herbs, eye drops, creams, and over-the-counter medicines. Any problems you or family members have had with anesthetic medicines. Any blood disorders you have. Any surgeries you have had. Any medical conditions you have. Whether you are pregnant or may be pregnant. What are the risks? Generally, this is a safe procedure. However, problems may occur, including: Infection. Bleeding. Allergic reactions to medicines. Damage to the urethra, bladder, or ureter. Urinary tract infection (UTI). Narrowing of the urethra (urethral stricture). Difficulty passing urine. Blockage of the kidney caused by a fragment of kidney stone. What happens before the procedure? Medicines Ask your health care provider about: Changing or stopping your regular medicines. This is especially important if you are taking diabetes medicines or  blood thinners. Taking medicines such as aspirin and ibuprofen. These medicines can thin your blood. Do not take these medicines unless your health care provider tells you to take them. Taking over-the-counter medicines, vitamins, herbs, and supplements. Eating and drinking Follow instructions from your health care provider about eating and drinking, which may include: 8 hours before the procedure - stop eating heavy meals or foods, such as meat, fried foods, or fatty foods. 6 hours before the procedure - stop eating light meals or foods, such as toast or cereal. 6 hours before the procedure - stop drinking milk or drinks that contain milk. 2 hours before the procedure - stop drinking clear liquids. Staying hydrated Follow instructions from your health care provider about hydration, which may include: Up to 2 hours before the procedure - you may continue to drink clear liquids, such as water, clear fruit juice, black coffee, and plain tea.  General instructions You may have a physical exam before the procedure. You may also have tests, such as imaging tests and blood or urine tests. If your ureter is too narrow, your health care provider may place a soft, flexible tube (stent) inside of it. The stent may be placed days or weeks before your laser therapy procedure. Plan to have someone take you home from the hospital or clinic. If you will be going home right after the procedure, plan to have someone stay with you for 24 hours. Do not use any products that contain nicotine or tobacco for at least 4 weeks before the procedure. These products include cigarettes, e-cigarettes, and chewing tobacco. If you need help quitting, ask your health care provider. Ask your health care provider: How your  surgical site will be marked or identified. What steps will be taken to help prevent infection. These may include: Removing hair at the surgery site. Washing skin with a germ-killing soap. Taking antibiotic  medicine. What happens during the procedure?  An IV will be inserted into one of your veins. You will be given one or more of the following: A medicine to help you relax (sedative). A medicine to numb the area (local anesthetic). A medicine to make you fall asleep (general anesthetic). A ureteroscope will be inserted into your urethra. The ureteroscope will send images to a video screen in the operating room to guide your surgeon to the area of your kidney that will be treated. A small, flexible tube will be threaded through the ureteroscope and into your bladder and ureter, up to your kidney. The laser device will be inserted into your kidney through the tube. Your surgeon will pulse the laser on and off to break up kidney stones. A surgical instrument that has a tiny wire basket may be inserted through the tube into your kidney to remove the pieces of broken kidney stone. The procedure may vary among health care providers and hospitals. What happens after the procedure? Your blood pressure, heart rate, breathing rate, and blood oxygen level will be monitored until you leave the hospital or clinic. You will be given pain medicine as needed. You may continue to receive antibiotics. You may have a stent temporarily placed in your ureter. Do not drive for 24 hours if you were given a sedative during your procedure. You may be given a strainer to collect any stone fragments that you pass in your urine. Your health care provider may have these tested. Summary Laser therapy for kidney stones is a procedure to break up kidney stones into pieces that are small enough to be passed out of the body through urination or removed during the procedure. Follow instructions from your health care provider about eating and drinking before the procedure. During the procedure, the ureteroscope will send images to a video screen to guide your surgeon to the area of your kidney that will be treated. Do not drive  for 24 hours if you were given a sedative during your procedure. This information is not intended to replace advice given to you by your health care provider. Make sure you discuss any questions you have with your health care provider. Document Revised: 01/18/2021 Document Reviewed: 01/18/2021 Elsevier Patient Education  2022 [place] for Kidney Stones, Care After This sheet gives you information about how to care for yourself after your procedure. Your health care provider may also give you more specific instructions. If you have problems or questions, contact your health care provider. What can I expect after the procedure? After the procedure, it is common to have: Pain. A burning sensation while urinating. Small amounts of blood in your urine. A need to urinate frequently. Pieces of kidney stone in your urine. Mild discomfort when urinating that may be felt in the back. You may experience this if you have a flexible tube (stent) in your ureter. Follow these instructions at home: Medicines Take over-the-counter and prescription medicines only as told by your health care provider. If you were prescribed an antibiotic medicine, take it as told by your health care provider. Do not stop taking the antibiotic even if you start to feel better. Ask your health care provider if the medicine prescribed to you: Requires you to avoid driving or using heavy  machinery. Can cause constipation. You may need to take actions to prevent or treat constipation, such as: Take over-the-counter or prescription medicines. Eat foods that are high in fiber, such as beans, whole grains, and fresh fruits and vegetables. Limit foods that are high in fat and processed sugars, such as fried or sweet foods. Activity Return to your normal activities as told by your health care provider. Ask your health care provider what activities are safe for you. Do not drive for 24 hours if you were given a  sedative during your procedure. General instructions If your health care provider approves, you may take a warm bath to ease discomfort and burning. Drink enough fluid to keep your urine pale yellow. Your health care provider may recommend drinking two 8 oz (237 mL) glasses of water per hour for a few hours after your procedure. You may be asked to strain your urine to collect any stone fragments that you pass. These fragments may be tested. Keep all follow-up visits as told by your health care provider. This is important. If you have a stent, you will need to return to your health care provider to have the stent removed. Contact a health care provider if you: Have pain or a burning feeling that lasts more than 2 days. Feel nauseous. Vomit more and more often. Have difficulty urinating. Have pain that gets worse or does not get better with medicine. Get help right away if: You are unable to urinate, even if your bladder feels full. You have: Bright red blood or blood clots in your urine. More blood in your urine. Severe pain or discomfort. A fever or shaking chills. Abdominal pain. Difficulty breathing. Swelling in your legs. Summary After the procedure, it is common to have a burning sensation while urinating and small amounts of blood in your urine. Take over-the-counter and prescription medicines only as told by your health care provider. Drink enough fluid to keep your urine pale yellow. Keep all follow-up visits as told by your health care provider. This is important. This information is not intended to replace advice given to you by your health care provider. Make sure you discuss any questions you have with your health care provider. Document Revised: 01/18/2021 Document Reviewed: 01/18/2021 Elsevier Patient Education  [place].

## 2021-06-09 NOTE — Progress Notes (Signed)
Surgical Physician Order Form [facility] [place]  * Scheduling expectation :  Patient preference  *Length of Case: 2 hours  *Clearance needed: no  *Anticoagulation Instructions: May continue all anticoagulants  *Aspirin Instructions: Ok to continue Aspirin  *Post-op visit Date/Instructions: TBD  *Diagnosis: Left Nephrolithiasis  *Procedure: left Ureteroscopy w/laser lithotripsy & stent placement (46002)   Additional orders: N/A  -Admit type: OUTpatient  -Anesthesia: General  -VTE Prophylaxis Standing Order SCDs       Other:   -Standing Lab Orders Per Anesthesia    Lab other: UA&Urine Culture  -Standing Test orders EKG/Chest x-ray per Anesthesia       Test other:   - Medications: Cipro 400 mg daily  -Other orders:  N/A

## 2021-06-10 ENCOUNTER — Telehealth: Payer: Self-pay

## 2021-06-10 NOTE — Telephone Encounter (Signed)
I spoke with Katie Zimmerman. We have discussed possible surgery dates and Friday January 27th, 2023 was agreed upon by all parties. Patient given information about surgery date, what to expect pre-operatively and post operatively.  We discussed that a Pre-Admission Testing office will be calling to set up the pre-op visit that will take place prior to surgery, and that these appointments are typically done over the phone with a Pre-Admissions RN. Informed patient that our office will communicate any additional care to be provided after surgery. Patients questions or concerns were discussed during our call. Advised to call our office should there be any additional information, questions or concerns that arise. Patient verbalized understanding.

## 2021-06-10 NOTE — Progress Notes (Signed)
[facility] Posting Form   Surgery Date/Time: Date: 06/25/2021  Surgeon: Dr. Nickolas Madrid, MD  Surgery Location: Day Surgery  Inpt ( No  )   Outpt (Yes)   Obs ( No  )   Diagnosis: N20.0 Left Nephrolithiasis  -CPT: 21947  Surgery: Left Ureteroscopy with Laser Lithotripsy and Stent Placement  Stop Anticoagulations: No  Cardiac/Medical/Pulmonary Clearance needed: no  *Orders entered into EPIC  Date: 06/10/2021    *Case booked in EPIC  Date: 06/10/21  *Notified pt of Surgery: Date: 06/09/2021  PRE-OP UA & CX: Yes, obtained on 06/09/2021  *Placed into Prior Authorization Work Fabio Bering Date: 06/10/21   Assistant/laser/rep:No

## 2021-06-11 ENCOUNTER — Telehealth: Payer: Medicare Other

## 2021-06-11 LAB — URINALYSIS, COMPLETE
Bilirubin, UA: NEGATIVE
Glucose, UA: NEGATIVE
Ketones, UA: NEGATIVE
Nitrite, UA: POSITIVE — AB
Specific Gravity, UA: 1.015 (ref 1.005–1.030)
Urobilinogen, Ur: 0.2 mg/dL (ref 0.2–1.0)
pH, UA: 7.5 (ref 5.0–7.5)

## 2021-06-11 LAB — MICROSCOPIC EXAMINATION: WBC, UA: >30 /hpf — AB (ref 0–5)

## 2021-06-13 LAB — CULTURE, URINE COMPREHENSIVE

## 2021-06-15 ENCOUNTER — Telehealth: Payer: Self-pay

## 2021-06-15 DIAGNOSIS — A498 Other bacterial infections of unspecified site: Secondary | ICD-10-CM

## 2021-06-15 MED ORDER — NITROFURANTOIN MONOHYD MACRO 100 MG PO CAPS: 100.0000 mg | ORAL_CAPSULE | Freq: Two times a day (BID) | ORAL | 0 refills | Status: AC

## 2021-06-15 NOTE — Telephone Encounter (Signed)
-----   Message from Billey Co, MD sent at 06/15/2021  8:13 AM EST ----- Nitrofurantoin 100mg  BID X 7 days to sterilize urine prior to upcoming surgery  Nickolas Madrid, MD 06/15/2021

## 2021-06-15 NOTE — Telephone Encounter (Signed)
Called pt informed her of the information below. RX sent to pharmacy. Pt voiced understanding.

## 2021-06-16 ENCOUNTER — Ambulatory Visit: Payer: Medicare Other | Admitting: Cardiovascular Disease

## 2021-06-16 ENCOUNTER — Encounter: Payer: Self-pay | Admitting: Cardiovascular Disease

## 2021-06-16 ENCOUNTER — Other Ambulatory Visit: Payer: Self-pay

## 2021-06-16 VITALS — BP 142/96 | HR 89 | Ht 58.5 in | Wt 162.0 lb

## 2021-06-16 DIAGNOSIS — Z01810 Encounter for preprocedural cardiovascular examination: Secondary | ICD-10-CM

## 2021-06-16 DIAGNOSIS — R55 Syncope and collapse: Secondary | ICD-10-CM

## 2021-06-16 NOTE — Patient Instructions (Addendum)
Medication Instructions:  °Your physician recommends that you continue on your current medications as directed. Please refer to the Current Medication list given to you today. ° °*If you need a refill on your cardiac medications before your next appointment, please call your pharmacy* ° °Lab Work: °If you have labs (blood work) drawn today and your tests are completely normal, you will receive your results only by: °MyChart Message (if you have MyChart) OR °A paper copy in the mail °If you have any lab test that is abnormal or we need to change your treatment, we will call you to review the results. ° °Testing/Procedures: °Your physician has requested that you have an echocardiogram. Echocardiography is a painless test that uses sound waves to create images of your heart. It provides your doctor with information about the size and shape of your heart and how well your heart’s chambers and valves are working. This procedure takes approximately one hour. There are no restrictions for this procedure. ° °Follow-Up: °At [facility], you and your health needs are our priority.  As part of our continuing mission to provide you with exceptional heart care, we have created designated Provider Care Teams.  These Care Teams include your primary Cardiologist (physician) and Advanced Practice Providers (APPs -  Physician Assistants and Nurse Practitioners) who all work together to provide you with the care you need, when you need it. ° °We recommend signing up for the patient portal called "MyChart".  Sign up information is provided on this After Visit Summary.  MyChart is used to connect with patients for Virtual Visits (Telemedicine).  Patients are able to view lab/test results, encounter notes, upcoming appointments, etc.  Non-urgent messages can be sent to your provider as well.   °To learn more about what you can do with MyChart, go to [email or web address].   ° °Your next appointment:   °As needed ° °The format for  your next appointment:   °In Person ° °Provider:   °Peter Nishan, MD { ° ° °

## 2021-06-17 ENCOUNTER — Inpatient Hospital Stay
Admission: RE | Admit: 2021-06-17 | Discharge: 2021-06-17 | Disposition: A | Discharge status: Home / Self Care | Payer: Medicare Other | Source: Ambulatory Visit

## 2021-06-17 NOTE — Patient Instructions (Addendum)
Your procedure is scheduled on: 06/25/21 - Friday Report to the Registration Desk on the 1st floor of the [facility]. To find out your arrival time, please call [phone] between 1PM - 3PM on: 06/24/21 - Thursday  REMEMBER: Instructions that are not followed completely may result in serious medical risk, up to and including death; or upon the discretion of your surgeon and anesthesiologist your surgery may need to be rescheduled.  Do not eat food or drink any fluids after midnight the night before surgery.  No gum chewing, lozengers or hard candies.  TAKE THESE MEDICATIONS THE MORNING OF SURGERY WITH A SIP OF WATER:  - TRELEGY ELLIPTA - gabapentin (NEURONTIN) 600 MG tablet  Use albuterol (VENTOLIN HFA) 108 (90 Base) MCG/ACT inhaler on the day of surgery and bring to the hospital.  One week prior to surgery: Stop Anti-inflammatories (NSAIDS) such as Advil, Aleve, Ibuprofen, Motrin, Naproxen, Naprosyn and Aspirin based products such as Excedrin, Goodys Powder, BC Powder.  Stop ANY OVER THE COUNTER supplements until after surgery.  You may however, continue to take Tylenol if needed for pain up until the day of surgery.  No Alcohol for 24 hours before or after surgery.  No Smoking including e-cigarettes for 24 hours prior to surgery.  No chewable tobacco products for at least 6 hours prior to surgery.  No nicotine patches on the day of surgery.  Do not use any "recreational" drugs for at least a week prior to your surgery.  Please be advised that the combination of cocaine and anesthesia may have negative outcomes, up to and including death. If you test positive for cocaine, your surgery will be cancelled.  On the morning of surgery brush your teeth with toothpaste and water, you may rinse your mouth with mouthwash if you wish. Do not swallow any toothpaste or mouthwash.  Do not wear jewelry, make-up, hairpins, clips or nail polish.  Do not wear lotions, powders, or  perfumes.   Do not shave body from the neck down 48 hours prior to surgery just in case you cut yourself which could leave a site for infection.  Also, freshly shaved skin may become irritated if using the CHG soap.  Contact lenses, hearing aids and dentures may not be worn into surgery.  Do not bring valuables to the hospital. [facility] is not responsible for any missing/lost belongings or valuables.   Notify your doctor if there is any change in your medical condition (cold, fever, infection).  Wear comfortable clothing (specific to your surgery type) to the hospital.  After surgery, you can help prevent lung complications by doing breathing exercises.  Take deep breaths and cough every 1-2 hours. Your doctor may order a device called an Incentive Spirometer to help you take deep breaths. When coughing or sneezing, hold a pillow firmly against your incision with both hands. This is called splinting. Doing this helps protect your incision. It also decreases belly discomfort.  If you are being admitted to the hospital overnight, leave your suitcase in the car. After surgery it may be brought to your room.  If you are being discharged the day of surgery, you will not be allowed to drive home. You will need a responsible adult (18 years or older) to drive you home and stay with you that night.   If you are taking public transportation, you will need to have a responsible adult (18 years or older) with you. Please confirm with your physician that it is acceptable to  use public transportation.   Please call the [facility] at [phone] if you have any questions about these instructions.  Surgery Visitation Policy:  Patients undergoing a surgery or procedure may have one family member or support person with them as long as that person is not COVID-19 positive or experiencing its symptoms.  That person may remain in the waiting area during the procedure and may  rotate out with other people.  Inpatient Visitation:    Visiting hours are 7 a.m. to 8 p.m. Up to two visitors ages 16+ are allowed at one time in a patient room. The visitors may rotate out with other people during the day. Visitors must check out when they leave, or other visitors will not be allowed. One designated support person may remain overnight. The visitor must pass COVID-19 screenings, use hand sanitizer when entering and exiting the patients room and wear a mask at all times, including in the patients room. Patients must also wear a mask when staff or their visitor are in the room. Masking is required regardless of vaccination status.

## 2021-06-18 ENCOUNTER — Other Ambulatory Visit: Payer: Self-pay

## 2021-06-18 ENCOUNTER — Encounter
Admission: RE | Admit: 2021-06-18 | Discharge: 2021-06-18 | Disposition: A | Discharge status: Home / Self Care | Payer: Medicare Other | Source: Ambulatory Visit | Attending: Urology | Admitting: Urology

## 2021-06-18 DIAGNOSIS — Z01812 Encounter for preprocedural laboratory examination: Secondary | ICD-10-CM

## 2021-06-18 HISTORY — DX: Personal history of urinary calculi: Z87.442

## 2021-06-18 NOTE — Patient Instructions (Addendum)
Your procedure is scheduled on: 06/25/21 Report to the Registration Desk on the 1st floor of the [facility]. To find out your arrival time, please call [phone] between 1PM - 3PM on: 06/24/21    REMEMBER: Instructions that are not followed completely may result in serious medical risk, up to and including death; or upon the discretion of your surgeon and anesthesiologist your surgery may need to be rescheduled.  Do not eat food after midnight the night before surgery.  No gum chewing, lozengers or hard candies.  TAKE THESE MEDICATIONS THE MORNING OF SURGERY WITH A SIP OF WATER:  - gabapentin (NEURONTIN) 600 MG tablet - albuterol (VENTOLIN HFA) 108 (90 Base) MCG/ACT inhaler and bring to the hospital .  One week prior to surgery: Stop Anti-inflammatories (NSAIDS) such as Advil, Aleve, Ibuprofen, Motrin, Naproxen, Naprosyn and Aspirin based products such as Excedrin, Goodys Powder, BC Powder.  Stop ANY OVER THE COUNTER supplements until after surgery.  You may take Tylenol if needed for pain up until the day of surgery.  No Alcohol for 24 hours before or after surgery.  No Smoking including e-cigarettes for 24 hours prior to surgery.  No chewable tobacco products for at least 6 hours prior to surgery.  No nicotine patches on the day of surgery.  Do not use any "recreational" drugs for at least a week prior to your surgery.  Please be advised that the combination of cocaine and anesthesia may have negative outcomes, up to and including death. If you test positive for cocaine, your surgery will be cancelled.  On the morning of surgery brush your teeth with toothpaste and water, you may rinse your mouth with mouthwash if you wish. Do not swallow any toothpaste or mouthwash.  Do not wear jewelry, make-up, hairpins, clips or nail polish.  Do not wear lotions, powders, or perfumes.   Do not shave body from the neck down 48 hours prior to surgery just in case you cut yourself  which could leave a site for infection.  Also, freshly shaved skin may become irritated if using the CHG soap.  Contact lenses, hearing aids and dentures may not be worn into surgery.  Do not bring valuables to the hospital. [facility] is not responsible for any missing/lost belongings or valuables.   Notify your doctor if there is any change in your medical condition (cold, fever, infection).  Wear comfortable clothing (specific to your surgery type) to the hospital.  After surgery, you can help prevent lung complications by doing breathing exercises.  Take deep breaths and cough every 1-2 hours. Your doctor may order a device called an Incentive Spirometer to help you take deep breaths. When coughing or sneezing, hold a pillow firmly against your incision with both hands. This is called splinting. Doing this helps protect your incision. It also decreases belly discomfort.  If you are being admitted to the hospital overnight, leave your suitcase in the car. After surgery it may be brought to your room.  If you are being discharged the day of surgery, you will not be allowed to drive home. You will need a responsible adult (18 years or older) to drive you home and stay with you that night.   If you are taking public transportation, you will need to have a responsible adult (18 years or older) with you. Please confirm with your physician that it is acceptable to use public transportation.   Please call the [facility] at [phone] if you have  any questions about these instructions.  Surgery Visitation Policy:  Patients undergoing a surgery or procedure may have one family member or support person with them as long as that person is not COVID-19 positive or experiencing its symptoms.  That person may remain in the waiting area during the procedure and may rotate out with other people.  Inpatient Visitation:    Visiting hours are 7 a.m. to 8 p.m. Up to two  visitors ages 16+ are allowed at one time in a patient room. The visitors may rotate out with other people during the day. Visitors must check out when they leave, or other visitors will not be allowed. One designated support person may remain overnight. The visitor must pass COVID-19 screenings, use hand sanitizer when entering and exiting the patients room and wear a mask at all times, including in the patients room. Patients must also wear a mask when staff or their visitor are in the room. Masking is required regardless of vaccination status.

## 2021-06-25 ENCOUNTER — Encounter: Payer: Self-pay | Admitting: Urology

## 2021-06-25 ENCOUNTER — Ambulatory Visit
Admission: RE | Admit: 2021-06-25 | Discharge: 2021-06-25 | Disposition: A | Discharge status: Home / Self Care | Payer: Medicare Other | Attending: Urology | Admitting: Urology

## 2021-06-25 ENCOUNTER — Other Ambulatory Visit: Payer: Self-pay

## 2021-06-25 ENCOUNTER — Ambulatory Visit: Payer: Medicare Other | Admitting: Anesthesiology

## 2021-06-25 ENCOUNTER — Ambulatory Visit: Payer: Medicare Other

## 2021-06-25 ENCOUNTER — Encounter
Admission: RE | Disposition: A | Discharge status: Home / Self Care | Payer: Self-pay | Source: Home / Self Care | Attending: Urology

## 2021-06-25 DIAGNOSIS — N201 Calculus of ureter: Secondary | ICD-10-CM | POA: Diagnosis not present

## 2021-06-25 DIAGNOSIS — Z6833 Body mass index (BMI) 33.0-33.9, adult: Secondary | ICD-10-CM | POA: Insufficient documentation

## 2021-06-25 DIAGNOSIS — N200 Calculus of kidney: Secondary | ICD-10-CM | POA: Diagnosis not present

## 2021-06-25 DIAGNOSIS — R7303 Prediabetes: Secondary | ICD-10-CM | POA: Diagnosis not present

## 2021-06-25 DIAGNOSIS — E669 Obesity, unspecified: Secondary | ICD-10-CM | POA: Diagnosis not present

## 2021-06-25 DIAGNOSIS — Z01812 Encounter for preprocedural laboratory examination: Secondary | ICD-10-CM

## 2021-06-25 HISTORY — PX: CYSTOSCOPY/URETEROSCOPY/HOLMIUM LASER/STENT PLACEMENT: SHX6546

## 2021-06-25 LAB — POCT I-STAT, CHEM 8
BUN: 14 mg/dL (ref 8–23)
Calcium, Ion: 1.13 mmol/L — ABNORMAL LOW (ref 1.15–1.40)
Chloride: 109 mmol/L (ref 98–111)
Creatinine, Ser: 1 mg/dL (ref 0.44–1.00)
Glucose, Bld: 115 mg/dL — ABNORMAL HIGH (ref 70–99)
HCT: 36 % (ref 36.0–46.0)
Hemoglobin: 12.2 g/dL (ref 12.0–15.0)
Potassium: 4.3 mmol/L (ref 3.5–5.1)
Sodium: 141 mmol/L (ref 135–145)
TCO2: 22 mmol/L (ref 22–32)

## 2021-06-25 LAB — CBC
HCT: 39 % (ref 36.0–46.0)
Hemoglobin: 12.5 g/dL (ref 12.0–15.0)
MCH: 29.8 pg (ref 26.0–34.0)
MCHC: 32.1 g/dL (ref 30.0–36.0)
MCV: 92.9 fL (ref 80.0–100.0)
Platelets: 334 10*3/uL (ref 150–400)
RBC: 4.2 MIL/uL (ref 3.87–5.11)
RDW: 13 % (ref 11.5–15.5)
WBC: 7.6 10*3/uL (ref 4.0–10.5)
nRBC: 0 % (ref 0.0–0.2)

## 2021-06-25 SURGERY — CYSTOSCOPY/URETEROSCOPY/HOLMIUM LASER/STENT PLACEMENT
Anesthesia: General | Laterality: Left

## 2021-06-25 MED ORDER — FENTANYL CITRATE (PF) 100 MCG/2ML IJ SOLN
INTRAMUSCULAR | Status: AC
  Filled 2021-06-25: qty 2

## 2021-06-25 MED ORDER — PHENYLEPHRINE HCL (PRESSORS) 10 MG/ML IV SOLN
INTRAVENOUS | Status: DC | PRN
  Administered 2021-06-25 (×5): 160 ug via INTRAVENOUS

## 2021-06-25 MED ORDER — ORAL CARE MOUTH RINSE: 15.0000 mL | Freq: Once | OROMUCOSAL | Status: AC

## 2021-06-25 MED ORDER — LACTATED RINGERS IV SOLN: INTRAVENOUS | Status: DC

## 2021-06-25 MED ORDER — OXYCODONE HCL 5 MG PO TABS
5.0000 mg | ORAL_TABLET | Freq: Once | ORAL | Status: AC | PRN
  Administered 2021-06-25: 5 mg via ORAL

## 2021-06-25 MED ORDER — OXYCODONE HCL 5 MG PO TABS
ORAL_TABLET | ORAL | Status: AC
  Filled 2021-06-25: qty 1

## 2021-06-25 MED ORDER — ONDANSETRON HCL 4 MG/2ML IJ SOLN
INTRAMUSCULAR | Status: DC | PRN
Start: 2021-06-25 — End: 2021-06-25
  Administered 2021-06-25: 4 mg via INTRAVENOUS

## 2021-06-25 MED ORDER — NITROFURANTOIN MACROCRYSTAL 100 MG PO CAPS: 100.0000 mg | ORAL_CAPSULE | Freq: Every day | ORAL | 0 refills | Status: AC

## 2021-06-25 MED ORDER — VASOPRESSIN 20 UNIT/ML IV SOLN
INTRAVENOUS | Status: DC | PRN
  Administered 2021-06-25 (×2): 2 [IU] via INTRAVENOUS

## 2021-06-25 MED ORDER — ONDANSETRON HCL 4 MG/2ML IJ SOLN
INTRAMUSCULAR | Status: AC
  Filled 2021-06-25: qty 2

## 2021-06-25 MED ORDER — ROCURONIUM BROMIDE 10 MG/ML (PF) SYRINGE
PREFILLED_SYRINGE | INTRAVENOUS | Status: AC
  Filled 2021-06-25: qty 10

## 2021-06-25 MED ORDER — DEXAMETHASONE SODIUM PHOSPHATE 10 MG/ML IJ SOLN
INTRAMUSCULAR | Status: DC | PRN
  Administered 2021-06-25: 8 mg via INTRAVENOUS

## 2021-06-25 MED ORDER — FENTANYL CITRATE (PF) 100 MCG/2ML IJ SOLN
INTRAMUSCULAR | Status: DC | PRN
  Administered 2021-06-25: 50 ug via INTRAVENOUS

## 2021-06-25 MED ORDER — DEXAMETHASONE SODIUM PHOSPHATE 10 MG/ML IJ SOLN
INTRAMUSCULAR | Status: AC
  Filled 2021-06-25: qty 1

## 2021-06-25 MED ORDER — EPHEDRINE SULFATE (PRESSORS) 50 MG/ML IJ SOLN
INTRAMUSCULAR | Status: DC | PRN
  Administered 2021-06-25: 5 mg via INTRAVENOUS
  Administered 2021-06-25 (×2): 10 mg via INTRAVENOUS

## 2021-06-25 MED ORDER — PROPOFOL 10 MG/ML IV BOLUS
INTRAVENOUS | Status: DC | PRN
  Administered 2021-06-25: 160 mg via INTRAVENOUS

## 2021-06-25 MED ORDER — OXYCODONE HCL 5 MG/5ML PO SOLN: 5.0000 mg | Freq: Once | ORAL | Status: AC | PRN

## 2021-06-25 MED ORDER — CHLORHEXIDINE GLUCONATE 0.12 % MT SOLN
OROMUCOSAL | Status: AC
  Administered 2021-06-25: 15 mL via OROMUCOSAL
  Filled 2021-06-25: qty 15

## 2021-06-25 MED ORDER — SUGAMMADEX SODIUM 200 MG/2ML IV SOLN
INTRAVENOUS | Status: DC | PRN
  Administered 2021-06-25: 150 mg via INTRAVENOUS

## 2021-06-25 MED ORDER — LIDOCAINE HCL (PF) 2 % IJ SOLN
INTRAMUSCULAR | Status: AC
  Filled 2021-06-25: qty 5

## 2021-06-25 MED ORDER — IOHEXOL 180 MG/ML  SOLN
INTRAMUSCULAR | Status: DC | PRN
  Administered 2021-06-25: 5 mL

## 2021-06-25 MED ORDER — HYDROCODONE-ACETAMINOPHEN 5-325 MG PO TABS: 1.0000 | ORAL_TABLET | ORAL | 0 refills | Status: AC | PRN

## 2021-06-25 MED ORDER — ACETAMINOPHEN 10 MG/ML IV SOLN: 1000.0000 mg | Freq: Once | INTRAVENOUS | Status: DC | PRN

## 2021-06-25 MED ORDER — CHLORHEXIDINE GLUCONATE 0.12 % MT SOLN: 15.0000 mL | Freq: Once | OROMUCOSAL | Status: AC

## 2021-06-25 MED ORDER — ROCURONIUM BROMIDE 100 MG/10ML IV SOLN
INTRAVENOUS | Status: DC | PRN
  Administered 2021-06-25: 40 mg via INTRAVENOUS

## 2021-06-25 MED ORDER — LIDOCAINE HCL (CARDIAC) PF 100 MG/5ML IV SOSY
PREFILLED_SYRINGE | INTRAVENOUS | Status: DC | PRN
  Administered 2021-06-25: 70 mg via INTRAVENOUS

## 2021-06-25 MED ORDER — ONDANSETRON HCL 4 MG/2ML IJ SOLN: 4.0000 mg | Freq: Once | INTRAMUSCULAR | Status: DC | PRN

## 2021-06-25 MED ORDER — CIPROFLOXACIN IN D5W 400 MG/200ML IV SOLN
400.0000 mg | INTRAVENOUS | Status: AC
  Administered 2021-06-25: 400 mg via INTRAVENOUS

## 2021-06-25 MED ORDER — FENTANYL CITRATE (PF) 100 MCG/2ML IJ SOLN
25.0000 ug | INTRAMUSCULAR | Status: DC | PRN
  Administered 2021-06-25: 25 ug via INTRAVENOUS

## 2021-06-25 MED ORDER — FAMOTIDINE 20 MG PO TABS
ORAL_TABLET | ORAL | Status: AC
  Administered 2021-06-25: 20 mg
  Filled 2021-06-25: qty 1

## 2021-06-25 MED ORDER — SODIUM CHLORIDE 0.9 % IR SOLN
Status: DC | PRN
  Administered 2021-06-25: 3000 mL via INTRAVESICAL

## 2021-06-25 MED ORDER — CIPROFLOXACIN IN D5W 400 MG/200ML IV SOLN
INTRAVENOUS | Status: AC
  Filled 2021-06-25: qty 200

## 2021-06-25 SURGICAL SUPPLY — 34 items
ADH LQ OCL WTPRF AMP STRL LF (MISCELLANEOUS)
ADHESIVE MASTISOL STRL (MISCELLANEOUS) IMPLANT
BAG DRAIN CYSTO-URO LG1000N (MISCELLANEOUS) ×3 IMPLANT
BRUSH SCRUB EZ 1% IODOPHOR (MISCELLANEOUS) ×3 IMPLANT
CATH URET FLEX-TIP 2 LUMEN 10F (CATHETERS) ×1 IMPLANT
CATH URETL OPEN 5X70 (CATHETERS) IMPLANT
CNTNR SPEC 2.5X3XGRAD LEK (MISCELLANEOUS)
CONT SPEC 4OZ STER OR WHT (MISCELLANEOUS)
CONT SPEC 4OZ STRL OR WHT (MISCELLANEOUS)
CONTAINER SPEC 2.5X3XGRAD LEK (MISCELLANEOUS) IMPLANT
DRAPE UTILITY 15X26 TOWEL STRL (DRAPES) ×3 IMPLANT
DRSG TEGADERM 2-3/8X2-3/4 SM (GAUZE/BANDAGES/DRESSINGS) IMPLANT
GAUZE 4X4 16PLY ~~LOC~~+RFID DBL (SPONGE) ×6 IMPLANT
GLOVE SURG UNDER POLY LF SZ7.5 (GLOVE) ×3 IMPLANT
GOWN STRL REUS W/ TWL LRG LVL3 (GOWN DISPOSABLE) ×2 IMPLANT
GOWN STRL REUS W/ TWL XL LVL3 (GOWN DISPOSABLE) ×2 IMPLANT
GOWN STRL REUS W/TWL LRG LVL3 (GOWN DISPOSABLE) ×2
GOWN STRL REUS W/TWL XL LVL3 (GOWN DISPOSABLE) ×2
GUIDEWIRE STR DUAL SENSOR (WIRE) ×3 IMPLANT
INFUSOR MANOMETER BAG 3000ML (MISCELLANEOUS) ×3 IMPLANT
IV NS IRRIG 3000ML ARTHROMATIC (IV SOLUTION) ×3 IMPLANT
KIT TURNOVER CYSTO (KITS) ×3 IMPLANT
PACK CYSTO AR (MISCELLANEOUS) ×3 IMPLANT
SET CYSTO W/LG BORE CLAMP LF (SET/KITS/TRAYS/PACK) ×3 IMPLANT
SHEATH URETERAL 12FRX35CM (MISCELLANEOUS) ×1 IMPLANT
STENT URET 6FRX22 CONTOUR (STENTS) ×1 IMPLANT
STENT URET 6FRX24 CONTOUR (STENTS) IMPLANT
STENT URET 6FRX26 CONTOUR (STENTS) IMPLANT
SURGILUBE 2OZ TUBE FLIPTOP (MISCELLANEOUS) ×3 IMPLANT
SYR 10ML LL (SYRINGE) ×3 IMPLANT
TRACTIP FLEXIVA PULSE ID 200 (Laser) IMPLANT
VALVE UROSEAL ADJ ENDO (VALVE) IMPLANT
WATER STERILE IRR 1000ML POUR (IV SOLUTION) ×3 IMPLANT
WATER STERILE IRR 500ML POUR (IV SOLUTION) ×3 IMPLANT

## 2021-06-25 NOTE — Op Note (Signed)
Date of procedure: 06/25/21  Preoperative diagnosis:  Left renal stone  Postoperative diagnosis:  Same  Procedure: Cystoscopy, left ureteroscopy, laser lithotripsy, left retrograde pyelogram with intraoperative interpretation, left ureteral stent placement  Surgeon: Nickolas Madrid, MD  Anesthesia: General  Complications: None  Intraoperative findings:  Normal cystoscopy, ureteral orifices orthotopic bilaterally Left partial staghorn stone protruding into the renal pelvis Vast majority of stone dusted, very soft.  On fluoroscopy small amount of residual stone appeared at lower pole but unable to visualize or access with flexible ureteroscope  EBL: Minimal  Specimens: None  Drains: Left 6 French by 22cm ureteral stent  Indication: Katie Zimmerman is a 76 y.o. patient with left partial staghorn stone who opted for ureteroscopy for management.  After reviewing the management options for treatment, they elected to proceed with the above surgical procedure(s). We have discussed the potential benefits and risks of the procedure, side effects of the proposed treatment, the likelihood of the patient achieving the goals of the procedure, and any potential problems that might occur during the procedure or recuperation. Informed consent has been obtained.  Description of procedure:  The patient was taken to the operating room and general anesthesia was induced. SCDs were placed for DVT prophylaxis. The patient was placed in the dorsal lithotomy position, prepped and draped in the usual sterile fashion, and preoperative antibiotics(Cipro) were administered. A preoperative time-out was performed.   A 21 French rigid cystoscope was used to intubate the urethra and thorough cystoscopy was performed.  The bladder was grossly normal, and the ureteral orifices orthotopic bilaterally.  A sensor wire advanced easily into the left ureteral orifice and passed up to the kidney under fluoroscopic vision.  The  stone could clearly be seen on KUB.  A dual-lumen ureteral access catheter was advanced over the wire to the level of the UPJ, and a second safety sensor wire added.  A 12/14 French by 35 cm ureteral access sheath was then gently advanced over the wire up to the proximal ureter.  The digital single channel flexible ureteroscope was advanced through the sheath and thorough pyeloscopy showed a 2 cm partial staghorn lower pole stone that protruded into the renal pelvis.  A 200 m laser fiber on settings of 0.3J and 80 Hz was used to methodically dust the stone.  The stone was very soft and dusted nicely.  I reached what appeared to be the bottom of the lower pole with the ureteroscope maximally flexed, and could not visualize any additional stone, however there appeared to be a 7 mm fragment in the lower pole on fluoroscopy.  Despite extensive navigation and moving the sheath placement, I was unable to visualize any additional stone.  Thorough pyeloscopy revealed no fragments <1 mm.  A retrograde pyelogram was performed from the proximal ureter and showed no extravasation or filling defects.  The sheath was removed and careful pullback ureteroscopy showed no ureteral injury or residual fragments.  A 6 French by 22 cm ureteral stent was placed fluoroscopically over the wire with a curl in the renal pelvis, as well as in the bladder.  The bladder was drained and this concluded our procedure.  Disposition: Stable to PACU  Plan: Follow-up in clinic for stent removal in 1 to 2 weeks  Nickolas Madrid, MD

## 2021-06-25 NOTE — Anesthesia Preprocedure Evaluation (Addendum)
Anesthesia Evaluation  Patient identified by MRN, date of birth, ID band Patient awake    Reviewed: Allergy & Precautions, NPO status , Patient's Chart, lab work & pertinent test results  History of Anesthesia Complications Negative for: history of anesthetic complications  Airway Mallampati: II   Neck ROM: Full    Dental  (+)    Pulmonary COPD,    Pulmonary exam normal breath sounds clear to auscultation       Cardiovascular Normal cardiovascular exam Rhythm:Regular Rate:Normal  ECG 01/19/21:  Normal sinus rhythm Cannot rule out Anterior infarct , age undetermined   Neuro/Psych Postherpetic neuralgia right face    GI/Hepatic GERD  ,  Endo/Other  Prediabetes, obesity  Renal/GU Renal disease (nephrolithiasis)     Musculoskeletal  (+) Fibromyalgia -  Abdominal   Peds  Hematology Breast CA   Anesthesia Other Findings   Reproductive/Obstetrics                            Anesthesia Physical Anesthesia Plan  ASA: 2  Anesthesia Plan: General   Post-op Pain Management:    Induction: Intravenous  PONV Risk Score and Plan: 3 and Ondansetron, Dexamethasone and Treatment may vary due to age or medical condition  Airway Management Planned: Oral ETT  Additional Equipment:   Intra-op Plan:   Post-operative Plan: Extubation in OR  Informed Consent: I have reviewed the patients History and Physical, chart, labs and discussed the procedure including the risks, benefits and alternatives for the proposed anesthesia with the patient or authorized representative who has indicated his/her understanding and acceptance.     Dental advisory given  Plan Discussed with: CRNA  Anesthesia Plan Comments: (Patient consented for risks of anesthesia including but not limited to:  - adverse reactions to medications - damage to eyes, teeth, lips or other oral mucosa - nerve damage due to positioning   - sore throat or hoarseness - damage to heart, brain, nerves, lungs, other parts of body or loss of life  Informed patient about role of CRNA in peri- and intra-operative care.  Patient voiced understanding.)        Anesthesia Quick Evaluation

## 2021-06-25 NOTE — Anesthesia Postprocedure Evaluation (Signed)
Anesthesia Post Note  Patient: Katie Zimmerman  Procedure(s) Performed: CYSTOSCOPY/URETEROSCOPY/HOLMIUM LASER/STENT PLACEMENT (Left)  Patient location during evaluation: PACU Anesthesia Type: General Level of consciousness: awake and alert, oriented and patient cooperative Pain management: pain level controlled Vital Signs Assessment: post-procedure vital signs reviewed and stable Respiratory status: spontaneous breathing, nonlabored ventilation and respiratory function stable Cardiovascular status: blood pressure returned to baseline and stable Postop Assessment: adequate PO intake Anesthetic complications: no   No notable events documented.   Last Vitals:  Vitals:   06/25/21 1345 06/25/21 1400  BP: 117/68 124/73  Pulse: 72 73  Resp: 16 (!) 23  Temp: (!) 36.2 C   SpO2: 90% 99%    Last Pain:  Vitals:   06/25/21 1400  TempSrc:   PainSc: [place]

## 2021-06-25 NOTE — Discharge Instructions (Signed)

## 2021-06-25 NOTE — H&P (Signed)
06/25/21 12:10 PM   [name] [birth year] [id number]  CC: Left renal stone  HPI: 76 year old female who was incidentally found to have a left partial staghorn lower pole kidney stone on CT in August 2022.  She had a history of uric acid stones and was previously followed at [facility] urology, and at our last visit she opted for a trial of potassium citrate for dissolution.  She is asymptomatic with no flank pain or recurrent UTIs.   I personally viewed and interpreted her most recent CT dated 06/08/2021 that shows no significant change in the size of the left lower pole stone that measures approximately 2.2 cm in largest dimension.   We reviewed these results at length today and discussed options moving forward including observation, ureteroscopy and laser lithotripsy with potential need for staged procedure, or percutaneous nephrolithotomy.   We discussed various treatment options for urolithiasis including observation with or without medical expulsive therapy, shockwave lithotripsy (SWL), ureteroscopy and laser lithotripsy with stent placement, and percutaneous nephrolithotomy.    PMH: Past Medical History:  Diagnosis Date   Breast cancer ([place]) 12/2011   Breast cancer ([place]) 85/8850   DCIS   Complication of anesthesia    hard to wake up age 36-not since   COPD (chronic obstructive pulmonary disease) ([place])    Fibromyalgia    GERD (gastroesophageal reflux disease)    History of kidney stones    History of radiation therapy 02/29/12 -04/21/12   right breast, 5040 cGy in 28 fx, boost to total 6240 cGy   Kidney stone    Osteoporosis    Personal history of colonic polyps-adenomas 05/25/2012   Personal history of radiation therapy    PHN (postherpetic neuralgia) 2000   Right side of face   Pneumonia 12/19/2018    Surgical History: Past Surgical History:  Procedure Laterality Date   APPENDECTOMY     BIOPSY BREAST  01/10/12   Right breast needle cor biopsy   BREAST BIOPSY   01/10/2012   BREAST CYST ASPIRATION  01/10/2012   BREAST LUMPECTOMY Right 01/26/2012   Right/ERPR+ DCIS, right ax snbx   FOOT SURGERY  1996   rt/lt great toes   GLAUCOMA SURGERY  1993   laser   OVARIAN CYST SURGERY  1964   TUBAL LIGATION  1976     Family History: Family History  Problem Relation Age of Onset   Hypertension Mother    Cancer Mother        LIVER cancer   Hypertension Father    Diabetes Father    Heart failure Father    Breast cancer Sister 57       NOT hormone receptor breast cancer   Breast cancer Maternal Aunt 75   Breast cancer Cousin 64       maternal cousin   Cancer Cousin 68       ? lung cancer; maternal cousin   Colon cancer Neg Hx    Stomach cancer Neg Hx     Social History:  reports that she has never smoked. She has been exposed to tobacco smoke. She has never used smokeless tobacco. She reports current alcohol use. She reports that she does not use drugs.  Physical Exam: BP (!) 149/86    Pulse 91    Temp 97.7 F (36.5 C) (Temporal)    Resp 18    Ht 4' 10.5" (1.486 m)    Wt 73.5 kg    SpO2 97%    BMI 33.29 kg/m  Constitutional:  Alert and oriented, No acute distress. Cardiovascular: RRR Respiratory: CTA bilaterally GI: Abdomen is soft, nontender, nondistended, no abdominal masses   Laboratory Data: Urine culture with asymptomatic Raoultella and citrobacter, treated with nitrofurantoin  Assessment & Plan:   76 year old female with large left renal stone, opted for ureteroscopy for treatment.  We specifically discussed the risks ureteroscopy including bleeding, infection/sepsis, stent related symptoms including flank pain/urgency/frequency/incontinence/dysuria, ureteral injury, inability to access stone, or need for staged or additional procedures.  Nickolas Madrid, MD 06/25/2021  [facility] [address], [place], [place] [address] [phone]

## 2021-06-25 NOTE — Transfer of Care (Signed)
Immediate Anesthesia Transfer of Care Note  Patient: Katie Zimmerman  Procedure(s) Performed: CYSTOSCOPY/URETEROSCOPY/HOLMIUM LASER/STENT PLACEMENT (Left)  Patient Location: PACU  Anesthesia Type:General  Level of Consciousness: awake  Airway & Oxygen Therapy: Patient Spontanous Breathing and Patient connected to face mask oxygen  Post-op Assessment: Report given to RN and Post -op Vital signs reviewed and stable  Post vital signs: Reviewed and stable  Last Vitals:  Vitals Value Taken Time  BP 119/62   Temp    Pulse 70 06/25/21 1318  Resp 16 06/25/21 1318  SpO2 98 % 06/25/21 1318  Vitals shown include unvalidated device data.  Last Pain:  Vitals:   06/25/21 1055  TempSrc: Temporal  PainSc: 0-No pain         Complications: No notable events documented.

## 2021-06-25 NOTE — Anesthesia Procedure Notes (Signed)
Procedure Name: Intubation Date/Time: 06/25/2021 12:26 PM Performed by: Aline Brochure, CRNA Pre-anesthesia Checklist: Patient identified, Patient being monitored, Timeout performed, Emergency Drugs available and Suction available Patient Re-evaluated:Patient Re-evaluated prior to induction Oxygen Delivery Method: Circle system utilized Preoxygenation: Pre-oxygenation with 100% oxygen Induction Type: IV induction Ventilation: Mask ventilation without difficulty Laryngoscope Size: 3 and McGraph Grade View: Grade I Tube type: Oral Tube size: 7.0 mm Number of attempts: 1 Airway Equipment and Method: Stylet Placement Confirmation: ETT inserted through vocal cords under direct vision, positive ETCO2 and breath sounds checked- equal and bilateral Secured at: 19 cm Tube secured with: Tape Dental Injury: Teeth and Oropharynx as per pre-operative assessment

## 2021-06-26 ENCOUNTER — Encounter: Payer: Self-pay | Admitting: Urology

## 2021-06-28 ENCOUNTER — Telehealth: Payer: Self-pay

## 2021-06-28 NOTE — Telephone Encounter (Signed)
I have called patient to schedule 1-2 week cysto stent removal per Dr. Diamantina [place] OP note. No Answer and voicemail is full, will try again.

## 2021-06-29 DIAGNOSIS — E7800 Pure hypercholesterolemia, unspecified: Secondary | ICD-10-CM | POA: Diagnosis not present

## 2021-06-29 DIAGNOSIS — M810 Age-related osteoporosis without current pathological fracture: Secondary | ICD-10-CM

## 2021-06-29 DIAGNOSIS — J449 Chronic obstructive pulmonary disease, unspecified: Secondary | ICD-10-CM

## 2021-06-30 ENCOUNTER — Other Ambulatory Visit (HOSPITAL_COMMUNITY): Payer: Medicare Other

## 2021-07-05 NOTE — Progress Notes (Signed)
-HPI  female never smoker(prolonged second hand smoke) followed for chronic bronchitis, complicated by right breast cancer/XRT, GERD, Postherpetic Neuralgia,  Office Spirometry 10/16/2017-moderate restriction of exhaled volume without obvious obstruction.  FVC 1.57/68%, FEV1 1.27/73%, ratio 0.81, FEF 25-75% 1.37/88% Echocardiogram EF 77-41, grade 1 diastolic dysfunction PFT 2/87/8676-HMCN restriction, minimal diffusion defect.  FVC 1.72/71%, FEV1 1.53/85%, ratio 0.89, TLC 69%, DLCO 74% Labs 11/17/2017-IgE 241 H, Eos  0.5, BNP 69 FENO 06/20/2018-- 11 WNL --------------------------------------------------------------   12/28/20- 76 year old female never smoker(prolonged second hand smoke) followed for chronic bronchitis/ COPD, complicated by right Breast Cancer/XRT, GERD , Postherpetic Neuralgia, Obesity,  -Trelegy 100, Ventolin hfa Covid vax-3 Phizer -----Coughing and when coughing too much she begins to wheeze. Voice sometimes begins to go away.  Persistent cough, yellow, no fever. Denies reflux or postnasal drip. Has not had Covid infection. Has husband's neb machine- needs meds from a DME. Has been falling- not tussive.  Has referrals pending for cardiology and neurology. CXR 08/28/20- The lungs are clear. Heart size is mildly enlarged. No pneumothorax or pleural effusion. No acute or focal bony abnormality. Surgical clips right breast noted. IMPRESSION: No acute disease.  07/06/21-76 year old female never smoker(prolonged second hand smoke) followed for chronic bronchitis/ COPD, complicated by right Breast Cancer/XRT, GERD , Postherpetic Neuralgia, Obesity, Kidney Stone,  -Trelegy 100, Ventolin hfa Covid vax-3 Phizer Flu vax-had -----Patient feels like her breathing is about the same since last visit. States that she is coughing a lot at night and has to use her rescue inhaler to get it to stop. Cough intensity varies with weather.  Not sure what other factors are involved but tends to be  worse when she is lying down in the evening.  She feels as if she is coughing mucus up from her chest and denies any recognition of reflux or postnasal drip.  Inhalers do help.  Some persistent wheeze and white/yellow sputum noted. CXR 01/19/21 1V-  IMPRESSION: No active disease.  ROS-see HPI  + = positive Constitutional:   No-   weight loss, night sweats, fevers, chills, fatigue, lassitude. HEENT:   No-  headaches, difficulty swallowing, tooth/dental problems, sore throat,       No-  sneezing, itching, ear ache, nasal congestion, post nasal drip,  CV:  No-   chest pain, orthopnea, PND, swelling in lower extremities, anasarca,                                     dizziness, palpitations Resp: +shortness of breath with exertion or at rest.             +productive cough,  + non-productive cough,  No- coughing up of blood.            +change in color of mucus.   +wheezing.   Skin: No-   rash or lesions. GI:  No-   heartburn, indigestion, abdominal pain, nausea, vomiting, diarrhea,                 change in bowel habits, loss of appetite GU:  MS:  No-   joint pain or swelling.   Neuro-    + Falls, + postherpetic neuralgia Psych:  No- change in mood or affect. No depression or anxiety.  No memory loss.  OBJ- Physical Exam General- Alert, Oriented, Affect-appropriate, Distress- none acute, + obese Skin- rash-none, lesions- none, excoriation- none Lymphadenopathy- none Head- atraumatic  Eyes- Gross vision intact, PERRLA, conjunctivae and secretions clear            Ears- Hearing, canals-normal            Nose- Clear, no-Septal dev, mucus, polyps, erosion, perforation             Throat- Mallampati III , mucosa clear , drainage- none, tonsils- atrophic Neck- flexible , trachea midline, no stridor , thyroid nl, carotid no bruit Chest - symmetrical excursion , unlabored           Heart/CV- RRR , no murmur , no gallop  , no rub, nl s1 s2                           - JVD- none , edema-  none, stasis changes- none, varices- none           Lung- + clear, wheeze-none, cough+barking , dullness-none, rub- none           Chest wall-  +Hx R lumpectomy/XRT Abd-  Br/ Gen/ Rectal- Not done, not indicated Extrem- cyanosis- none, clubbing, none, atrophy- none, strength- nl Neuro- grossly intact to observation

## 2021-07-06 ENCOUNTER — Encounter: Payer: Self-pay | Admitting: Internal Medicine

## 2021-07-06 ENCOUNTER — Ambulatory Visit: Payer: Medicare Other | Admitting: Internal Medicine

## 2021-07-06 ENCOUNTER — Other Ambulatory Visit: Payer: Self-pay

## 2021-07-06 ENCOUNTER — Ambulatory Visit (INDEPENDENT_AMBULATORY_CARE_PROVIDER_SITE_OTHER): Payer: Medicare Other

## 2021-07-06 VITALS — BP 140/90 | HR 97 | Temp 98.2°F | Ht 58.5 in | Wt 156.4 lb

## 2021-07-06 DIAGNOSIS — J209 Acute bronchitis, unspecified: Secondary | ICD-10-CM

## 2021-07-06 DIAGNOSIS — J42 Unspecified chronic bronchitis: Secondary | ICD-10-CM

## 2021-07-06 DIAGNOSIS — R053 Chronic cough: Secondary | ICD-10-CM | POA: Diagnosis not present

## 2021-07-06 DIAGNOSIS — N200 Calculus of kidney: Secondary | ICD-10-CM | POA: Diagnosis not present

## 2021-07-06 NOTE — Patient Instructions (Signed)
Order- CXR- chronic bronchitis  Order- Sputum culture and sensitivity- routine, fungal, AFB/immunofluor    dx chronic bronchitis  Ok to continue current inhalers

## 2021-07-07 ENCOUNTER — Ambulatory Visit (HOSPITAL_COMMUNITY): Payer: Medicare Other | Attending: Cardiology

## 2021-07-07 DIAGNOSIS — R55 Syncope and collapse: Secondary | ICD-10-CM | POA: Insufficient documentation

## 2021-07-07 LAB — ECHOCARDIOGRAM COMPLETE
Area-P 1/2: 3.72 cm2
S' Lateral: 2.9 cm

## 2021-07-07 NOTE — Assessment & Plan Note (Signed)
She will continue inhalers.  Suspect multifactorial cough with a chronic bronchitis component.  Continuing current inhalers Plan-CXR, sputum culture

## 2021-07-07 NOTE — Assessment & Plan Note (Deleted)
Office Spirometry 10/16/2017-moderate restriction of exhaled volume without obvious obstruction.  FVC 1.57/68%, FEV1 1.27/73%, ratio 0.81, FEF 25-75% 1.37/88% Labs 11/17/2017-IgE 241 H, Eos  0.5, BNP 69 FENO 06/20/2018-- 11 WNL

## 2021-07-07 NOTE — Assessment & Plan Note (Signed)
Managing as chronic bronchitis but have reviewed with her common problems that can lead to chronic cough, so she can be observant.

## 2021-07-08 ENCOUNTER — Other Ambulatory Visit: Payer: Self-pay

## 2021-07-08 ENCOUNTER — Encounter: Payer: Self-pay | Admitting: Urology

## 2021-07-08 ENCOUNTER — Ambulatory Visit (INDEPENDENT_AMBULATORY_CARE_PROVIDER_SITE_OTHER): Payer: Medicare Other | Admitting: Urology

## 2021-07-08 VITALS — BP 125/81 | HR 94 | Ht 58.5 in | Wt 156.0 lb

## 2021-07-08 DIAGNOSIS — Z466 Encounter for fitting and adjustment of urinary device: Secondary | ICD-10-CM

## 2021-07-08 LAB — URINALYSIS, COMPLETE
Bilirubin, UA: NEGATIVE
Nitrite, UA: NEGATIVE
Specific Gravity, UA: 1.02 (ref 1.005–1.030)
Urobilinogen, Ur: 1 mg/dL (ref 0.2–1.0)
pH, UA: 8.5 — ABNORMAL HIGH (ref 5.0–7.5)

## 2021-07-08 LAB — MICROSCOPIC EXAMINATION: RBC, Urine: >30 /hpf — AB (ref 0–2)

## 2021-07-08 MED ORDER — LIDOCAINE HCL URETHRAL/MUCOSAL 2 % EX GEL
1.0000 "application " | Freq: Once | CUTANEOUS | Status: AC
  Administered 2021-07-08: 1 via URETHRAL

## 2021-07-08 MED ORDER — NITROFURANTOIN MONOHYD MACRO 100 MG PO CAPS
100.0000 mg | ORAL_CAPSULE | Freq: Once | ORAL | Status: AC
  Administered 2021-07-08: 100 mg via ORAL

## 2021-07-08 MED ORDER — NITROFURANTOIN MONOHYD MACRO 100 MG PO CAPS: 100.0000 mg | ORAL_CAPSULE | Freq: Two times a day (BID) | ORAL | Status: DC

## 2021-07-08 NOTE — Progress Notes (Signed)
Cystoscopy Procedure Note:  Indication: Stent removal s/p 06/25/2021 left ureteroscopy, laser lithotripsy, stent placement for left partial staghorn stone  After informed consent and discussion of the procedure and its risks, Katie Zimmerman was positioned and prepped in the standard fashion. Cystoscopy was performed with a flexible cystoscope. The stent was grasped with flexible graspers and removed in its entirety. The patient tolerated the procedure well.  Findings: Uncomplicated stent removal  Assessment and Plan: Continue potassiums citrate RTC 9 months KUB  Billey Co, MD 07/08/2021

## 2021-08-19 ENCOUNTER — Other Ambulatory Visit: Payer: Self-pay | Admitting: Family Medicine

## 2021-08-19 DIAGNOSIS — Z1231 Encounter for screening mammogram for malignant neoplasm of breast: Secondary | ICD-10-CM

## 2021-08-31 ENCOUNTER — Telehealth: Payer: Self-pay

## 2021-08-31 NOTE — Chronic Care Management (AMB) (Signed)
? ? ?Chronic Care Management ?Pharmacy Assistant  ? ?Name: Katie Zimmerman  MRN: [id number] DOB: 27-Jun-1945 ? ?Reason for Encounter: COPD  Disease State ? ?Recent office visits:  ?None since last CCM contact ? ?Recent consult visits:  ?07/08/21-Urology-Brian Sninsky,MD- removal of ureteral stent left side-no complications- no medication changes ?07/06/21-Pulmonology-Clinton Young,MD- follow up chronic bronchitis.Patient is using rescue inhaler ?06/25/21-[facility]- Urology-Brian Sninsky,MD-Cystoscopy with stent placement- no admission-start taking nitrofurantoin  '100mg'$  and hydrocodone 5/325. ?06/16/21-Cardiology-Peter Fredirick Lathe op for Urology ?06/09/21- Urology-Brian Sninsky,MD-Left kidney stones-Schedule left ureteroscopy, laser lithotripsy, stent placement Urinalysis and culture today for preop(Nitrofurantoin '100mg'$  BID X 7 days to sterilize urine prior to upcoming surgery) ? ?Hospital visits:  ?None in previous 6 months ? ?Medications: ?Outpatient Encounter Medications as of 08/31/2021  ?Medication Sig  ? acetaminophen (TYLENOL) 500 MG tablet Take 1,000 mg by mouth every 4 (four) hours as needed for mild pain, moderate pain or headache.   ? albuterol (VENTOLIN HFA) 108 (90 Base) MCG/ACT inhaler Inhale 2 puffs into the lungs every 6 (six) hours as needed for wheezing or shortness of breath.  ? Cholecalciferol (VITAMIN D) 50 MCG (2000 UT) tablet Take 2,000 Units by mouth daily.  ? diclofenac Sodium (VOLTAREN) 1 % GEL Apply 1 application topically 2 (two) times daily as needed (pain).  ? DULoxetine (CYMBALTA) 30 MG capsule Take 1 tablet every night (Patient taking differently: Take 30 mg by mouth at bedtime as needed (neuropathy).)  ? Fluticasone-Umeclidin-Vilant (TRELEGY ELLIPTA) 100-62.5-25 MCG/ACT AEPB Inhale 1 puff then rinse mouth once daily  ? gabapentin (NEURONTIN) 600 MG tablet Take 2 tablets three times a day  ? ipratropium-albuterol (DUONEB) 0.5-2.5 (3) MG/3ML SOLN Take 3 mLs by nebulization every 6 (six) hours as  needed.  ? Multiple Vitamins-Minerals (GNP HAIR/SKIN/NAILS PO) Take 3 tablets by mouth daily.  ? nortriptyline (PAMELOR) 50 MG capsule Take 3 capsules every night  ? Potassium Citrate 15 MEQ (1620 MG) TBCR Take 2 tablets by mouth in the morning and at bedtime.  ? SF 5000 PLUS 1.1 % CREA dental cream Place 1 application onto teeth at bedtime.  ? traMADol (ULTRAM) 50 MG tablet TAKE 1/2 TO 1 (ONE-HALF TO ONE) TABLET BY MOUTH ONCE DAILY AS NEEDED FOR SEVERE PAIN  ? vitamin B-12 (CYANOCOBALAMIN) 1000 MCG tablet Take 1,000 mcg by mouth daily.  ? ?No facility-administered encounter medications on file as of 08/31/2021.  ? ? ?Contacted patient to discuss COPD disease state 09/02/2021 ? ?Current COPD regimen:  ?       Rescue:  Albuterol HFA 108 inhaler - 2 puffs every 6 hours ?       Maintenance: Trelegy 100-62.5-25 mcg - 1 puff daily  ?   Duoneb nebulizer - 3 mL every 6 hours as needed- has not needed for months  ?+ ?Any recent hospitalizations or ED visits since last visit with CPP? No ? ?denies COPD symptoms, including Increased shortness of breath , Rescue medicine is not helping, Shortness of breath at rest, Symptoms worse with exercise, Symptoms worse at night, and Wheezing ?  ?What recent interventions/DTPs have been made by any provider to improve breathing since last visit: pulmonology  recommends continuing current treatment  ?  ?Have you had exacerbation/flare-up since last visit? No ? ?What do you do when you are short of breath?  Adhere to COPD Action Plan, Rescue medication, and Rest ? ?Current tobacco use? none ? ?Respiratory Devices/Equipment ?Do you have a nebulizer? Yes ?Do you use a Peak Flow Meter? No ?Do you  use a maintenance inhaler? Yes  Patient uses trelegy daily before brushing teeth in am  ?How often do you forget to use your daily inhaler? never ?Do you use a rescue inhaler? Yes ?How often do you use your rescue inhaler?  several times per month the patient reports uses on sundays to sing ?Do you use a  spacer with your inhaler? Yes ? ?Adherence Review: ?Does the patient have >5 day gap between last estimated fill date for maintenance inhaler medications? No  The patient reports she has supply on hand and does not miss any doses  ? ?09/20/21  Neurology upcoming ? ?Care Gaps: ?Annual wellness visit in last year? Yes ?Most Recent BP reading:140/90  97-P  07/06/21 ? ? ?Star Rated Drugs- no star medications identified ?Medication Name    Last Fill Date  Day supply ?Trelegy Ellipta 100-62.5-25mcg/act  06/07/21   30 ?Duoneb 0.5-2.5 '3mg'$ /56m   04/08/21  15 ?Albuterol HFA 108    06/09/21  25 ? ?LCharlene Brooke CPP notified ? ?Trenden Hazelrigg, CCMA ?Health concierge  ?[phone] ?

## 2021-09-10 ENCOUNTER — Ambulatory Visit
Admission: RE | Admit: 2021-09-10 | Discharge: 2021-09-10 | Disposition: A | Discharge status: Home / Self Care | Payer: Medicare Other | Source: Ambulatory Visit | Attending: Family Medicine | Admitting: Family Medicine

## 2021-09-10 DIAGNOSIS — Z1231 Encounter for screening mammogram for malignant neoplasm of breast: Secondary | ICD-10-CM

## 2021-09-20 ENCOUNTER — Ambulatory Visit: Payer: Medicare Other | Admitting: Neurology

## 2021-09-21 ENCOUNTER — Ambulatory Visit (INDEPENDENT_AMBULATORY_CARE_PROVIDER_SITE_OTHER): Payer: Medicare Other | Admitting: Neurology

## 2021-09-21 ENCOUNTER — Encounter: Payer: Self-pay | Admitting: Neurology

## 2021-09-21 VITALS — BP 120/73 | HR 95 | Ht 58.5 in | Wt 154.0 lb

## 2021-09-21 DIAGNOSIS — B0229 Other postherpetic nervous system involvement: Secondary | ICD-10-CM | POA: Diagnosis not present

## 2021-09-21 MED ORDER — TRAMADOL HCL 50 MG PO TABS: ORAL_TABLET | ORAL | 5 refills | Status: AC

## 2021-09-21 MED ORDER — NORTRIPTYLINE HCL 50 MG PO CAPS: ORAL_CAPSULE | ORAL | 3 refills | Status: DC

## 2021-09-21 MED ORDER — DULOXETINE HCL 30 MG PO CPEP: ORAL_CAPSULE | ORAL | 3 refills | Status: DC

## 2021-09-21 MED ORDER — GABAPENTIN 600 MG PO TABS: ORAL_TABLET | ORAL | 3 refills | Status: DC

## 2021-09-21 NOTE — Progress Notes (Signed)
? ?NEUROLOGY FOLLOW UP OFFICE NOTE ? ?Katie Zimmerman ?[id number] ?May 26, 1946 ? ?HISTORY OF PRESENT ILLNESS: ?I had the pleasure of seeing Katie Zimmerman in follow-up in the neurology clinic on 09/21/2021.  The patient was last seen 6 months ago for right-sided postherpetic neuralgia. She has been doing well with her regimen of Gabapentin '1200mg'$  BID, nortriptyline '150mg'$  qhs, and Cymbalta '30mg'$  qhs. She denies any side effects, no drowsiness. She takes prn Tramadol rarely, her last refill was a year ago. She states that when she is on her regimen, pain is 0/10. She ran out of Cymbalta and has been having pain, it is painful on the right side when she is talking today. She reports buzzing in the right ear. She denies any dizziness. She has had pain at the tip of her left middle finger when she flexes at the joint. She lives with her brother-in-law and his daughter. She had a fall turning too fast last Sunday, no injuries.  ? ? ?History on Initial Assessment 01/15/2018: This is a pleasant 76 year old right-handed woman with a history of breast cancer, presenting for postherpetic neuralgia. She had shingles affecting the right side of her face in 2000. She did well for a year or 2 with no symptoms then started having significant pain in the same distribution. She describes pain as pressure and tenderness when she washes or touches her cheek on the right side of her nose. It is painful to talk, with pain radiating from the right temporal region down the right side of her tongue. It is hard to eat and talk. Bending down seems to worsen pain. She does have a diagnosis of TMJ on the right as well. She was tried on different medications in the past few years and found gabapentin helped for a while, then stopped working. She tried Lyrica several years ago which also helped for a little while, then she saw a neurologist who increased to the dose to '200mg'$  but she never filled the higher dose because she developed kidney stones in 2011.  Around that time, the pain was not bothering her so much, but when pain recurred, she was restarted on gabapentin which worked pretty good until recently. For the past few months, she has had a constant 10/10 pain (appears comfortable in the office today), when she has sharp pains, the pain would be more than a 10. Pain is worse at night, really bad around 9pm. She takes her gabapentin and can sleep. The last few days she has increased gabapentin '300mg'$  taking 3 in AM, 2 at supper, 3 at bedtime. No drowsiness. She denies any tinnitus but feels she has lost some hearing in the right ear. No vision changes. For a time she was having headaches with pressure over the vertex, but none lately. No nausea/vomiting, dizziness, diplopia, dysarthria/dysphagia, neck pain, focal numbness/tingling/weakness in the extremities, bowel/bladder dysfunction. Left side of face is unaffected. She has chronic back pain.  ? ? ?PAST MEDICAL HISTORY: ?Past Medical History:  ?Diagnosis Date  ? Breast cancer ([place]) 12/2011  ? Breast cancer ([place]) 12/2011  ? DCIS  ? Complication of anesthesia   ? hard to wake up age 76-not since  ? COPD (chronic obstructive pulmonary disease) ([place])   ? Fibromyalgia   ? GERD (gastroesophageal reflux disease)   ? History of kidney stones   ? History of radiation therapy 02/29/12 -04/21/12  ? right breast, 5040 cGy in 28 fx, boost to total 6240 cGy  ? Kidney stone   ?  Osteoporosis   ? Personal history of colonic polyps-adenomas 05/25/2012  ? Personal history of radiation therapy   ? PHN (postherpetic neuralgia) 2000  ? Right side of face  ? Pneumonia 12/19/2018  ? ? ?MEDICATIONS: ?Current Outpatient Medications on File Prior to Visit  ?Medication Sig Dispense Refill  ? acetaminophen (TYLENOL) 500 MG tablet Take 1,000 mg by mouth every 4 (four) hours as needed for mild pain, moderate pain or headache.     ? albuterol (VENTOLIN HFA) 108 (90 Base) MCG/ACT inhaler Inhale 2 puffs into the lungs every 6 (six) hours as  needed for wheezing or shortness of breath. 18 g 12  ? Cholecalciferol (VITAMIN D) 50 MCG (2000 UT) tablet Take 2,000 Units by mouth daily.    ? DULoxetine (CYMBALTA) 30 MG capsule Take 1 tablet every night (Patient taking differently: Take 30 mg by mouth at bedtime as needed (neuropathy).) 30 capsule 11  ? Fluticasone-Umeclidin-Vilant (TRELEGY ELLIPTA) 100-62.5-25 MCG/ACT AEPB Inhale 1 puff then rinse mouth once daily 60 each 12  ? gabapentin (NEURONTIN) 600 MG tablet Take 2 tablets three times a day 540 tablet 3  ? ipratropium-albuterol (DUONEB) 0.5-2.5 (3) MG/3ML SOLN Take 3 mLs by nebulization every 6 (six) hours as needed. 150 mL 12  ? Multiple Vitamins-Minerals (GNP HAIR/SKIN/NAILS PO) Take 3 tablets by mouth daily.    ? nortriptyline (PAMELOR) 50 MG capsule Take 3 capsules every night 270 capsule 3  ? Potassium Citrate 15 MEQ (1620 MG) TBCR Take 2 tablets by mouth in the morning and at bedtime. 120 tablet 11  ? SF 5000 PLUS 1.1 % CREA dental cream Place 1 application onto teeth at bedtime.    ? traMADol (ULTRAM) 50 MG tablet TAKE 1/2 TO 1 (ONE-HALF TO ONE) TABLET BY MOUTH ONCE DAILY AS NEEDED FOR SEVERE PAIN 30 tablet 5  ? vitamin B-12 (CYANOCOBALAMIN) 1000 MCG tablet Take 1,000 mcg by mouth daily.    ? diclofenac Sodium (VOLTAREN) 1 % GEL Apply 1 application topically 2 (two) times daily as needed (pain).    ? ?No current facility-administered medications on file prior to visit.  ? ? ?ALLERGIES: ?Allergies  ?Allergen Reactions  ? Penicillins Anaphylaxis, Rash and Other (See Comments)  ?  Has patient had a PCN reaction causing immediate rash, facial/tongue/throat swelling, SOB or lightheadedness with hypotension: Yes ?Has patient had a PCN reaction causing severe rash involving mucus membranes or skin necrosis: No ?Has patient had a PCN reaction that required hospitalization No ?Has patient had a PCN reaction occurring within the last 10 years: No ?If all of the above answers are "NO", then may proceed with  Cephalosporin use.  ? Venlafaxine Nausea Only  ? Sulfonamide Derivatives Rash  ? ? ?FAMILY HISTORY: ?Family History  ?Problem Relation Age of Onset  ? Hypertension Mother   ? Cancer Mother   ?     LIVER cancer  ? Hypertension Father   ? Diabetes Father   ? Heart failure Father   ? Breast cancer Sister 2  ?     NOT hormone receptor breast cancer  ? Breast cancer Maternal Aunt 75  ? Breast cancer Cousin 7  ?     maternal cousin  ? Cancer Cousin 11  ?     ? lung cancer; maternal cousin  ? Colon cancer Neg Hx   ? Stomach cancer Neg Hx   ? ? ?SOCIAL HISTORY: ?Social History  ? ?Socioeconomic History  ? Marital status: Widowed  ?  Spouse name: Not  on file  ? Number of children: 2  ? Years of education: Not on file  ? Highest education level: Not on file  ?Occupational History  ? Occupation: CONSULTANT  ?  Employer: [employer]  ?  Comment: Retired  ?Tobacco Use  ? Smoking status: Never  ?  Passive exposure: Yes  ? Smokeless tobacco: Never  ? Tobacco comments:  ?  Husband smoked  ?Vaping Use  ? Vaping Use: Never used  ?Substance and Sexual Activity  ? Alcohol use: Yes  ?  Comment: occasional glass of wine  ? Drug use: No  ? Sexual activity: Not Currently  ?Other Topics Concern  ? Not on file  ?Social History Narrative  ? Reviewed 12/2013  ? Regular exercise-yes-intermittantly at [employer]  ? Diet: fruits and veggies  ?   ? Right handed  ?   ? 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   ? Lives  brother in law and his daughter and grandson  Husband passed away  ? ?Social Determinants of Health  ? ?Financial Resource Strain: Low Risk   ? Difficulty of Paying Living Expenses: Not very hard  ?Food Insecurity: No Food Insecurity  ? Worried About Charity fundraiser in the Last Year: Never true  ? Ran Out of Food in the Last Year: Never true  ?Transportation Needs: No Transportation Needs  ? Lack of Transportation (Medical): No  ? Lack of Transportation (Non-Medical): No  ?Physical Activity: Insufficiently Active   ? Days of Exercise per Week: 3 days  ? Minutes of Exercise per Session: 30 min  ?Stress: Not on file  ?Social Connections: Moderately Integrated  ? Frequency of Communication with Friends and Family: Three tim

## 2021-09-21 NOTE — Patient Instructions (Signed)
Always good to see you. Continue all your medications, refills sent. Follow-up in 6-8 months, call for any changes. ?

## 2021-09-28 ENCOUNTER — Ambulatory Visit (INDEPENDENT_AMBULATORY_CARE_PROVIDER_SITE_OTHER): Payer: Medicare Other | Admitting: Family

## 2021-09-28 ENCOUNTER — Encounter: Payer: Self-pay | Admitting: Family

## 2021-09-28 VITALS — BP 124/82 | HR 88 | Temp 98.4°F | Resp 16 | Ht 58.5 in | Wt 155.5 lb

## 2021-09-28 DIAGNOSIS — J209 Acute bronchitis, unspecified: Secondary | ICD-10-CM

## 2021-09-28 DIAGNOSIS — J440 Chronic obstructive pulmonary disease with acute lower respiratory infection: Secondary | ICD-10-CM

## 2021-09-28 DIAGNOSIS — J441 Chronic obstructive pulmonary disease with (acute) exacerbation: Secondary | ICD-10-CM

## 2021-09-28 HISTORY — DX: Acute bronchitis, unspecified: J20.9

## 2021-09-28 HISTORY — DX: Acute bronchitis, unspecified: J44.0

## 2021-09-28 MED ORDER — AZITHROMYCIN 250 MG PO TABS: ORAL_TABLET | ORAL | 0 refills | Status: AC

## 2021-09-28 MED ORDER — BENZONATATE 200 MG PO CAPS: 200.0000 mg | ORAL_CAPSULE | Freq: Three times a day (TID) | ORAL | 0 refills | Status: DC | PRN

## 2021-09-28 MED ORDER — PREDNISONE 20 MG PO TABS: ORAL_TABLET | ORAL | 0 refills | Status: DC

## 2021-09-28 NOTE — Progress Notes (Signed)
? ?Established Patient Office Visit ? ?Subjective:  ?Patient ID: Katie Zimmerman, female    DOB: 16-Jul-1945  Age: 76 y.o. MRN: [id number] ? ?CC:  ?Chief Complaint  ?Patient presents with  ?? Cough  ?  X 2 days  ? ? ?HPI ?Katie Zimmerman is here today with concerns.  ? ?Give days ago lost her voice, that lasted two days.  ?Then cough started, sometimes loose. With some chest congestion. Maybe some wheezing. No sob.  ?Some sob at times.  ?No sore throat, no ear pain.  ?No fever / chills.  ?Some hoarseness from coughing in throat.  ? ?Taking otc cough medication with much to no relief.  ? ? ? ?Past Medical History:  ?Diagnosis Date  ?? Breast cancer ([place]) 12/2011  ?? Breast cancer ([place]) 12/2011  ? DCIS  ?? Complication of anesthesia   ? hard to wake up age 81-not since  ?? COPD (chronic obstructive pulmonary disease) ([place])   ?? Fibromyalgia   ?? GERD (gastroesophageal reflux disease)   ?? History of kidney stones   ?? History of radiation therapy 02/29/12 -04/21/12  ? right breast, 5040 cGy in 28 fx, boost to total 6240 cGy  ?? Kidney stone   ?? Osteoporosis   ?? Personal history of colonic polyps-adenomas 05/25/2012  ?? Personal history of radiation therapy   ?? PHN (postherpetic neuralgia) 2000  ? Right side of face  ?? Pneumonia 12/19/2018  ? ? ?Past Surgical History:  ?Procedure Laterality Date  ?? APPENDECTOMY    ?? BIOPSY BREAST  01/10/12  ? Right breast needle cor biopsy  ?? BREAST BIOPSY  01/10/2012  ?? BREAST CYST ASPIRATION  01/10/2012  ?? BREAST LUMPECTOMY Right 01/26/2012  ? Right/ERPR+ DCIS, right ax snbx  ?? CYSTOSCOPY/URETEROSCOPY/HOLMIUM LASER/STENT PLACEMENT Left 06/25/2021  ? Procedure: CYSTOSCOPY/URETEROSCOPY/HOLMIUM LASER/STENT PLACEMENT;  Surgeon: Billey Co, MD;  Location: [facility] ORS;  Service: Urology;  Laterality: Left;  ?? FOOT SURGERY  1996  ? rt/lt great toes  ?? GLAUCOMA SURGERY  1993  ? laser  ?? OVARIAN CYST SURGERY  1964  ?? TUBAL LIGATION  1976  ? ? ?Family History  ?Problem Relation Age of Onset   ?? Hypertension Mother   ?? Cancer Mother   ?     LIVER cancer  ?? Hypertension Father   ?? Diabetes Father   ?? Heart failure Father   ?? Breast cancer Sister 95  ?     NOT hormone receptor breast cancer  ?? Breast cancer Maternal Aunt 75  ?? Breast cancer Cousin 36  ?     maternal cousin  ?? Cancer Cousin 11  ?     ? lung cancer; maternal cousin  ?? Colon cancer Neg Hx   ?? Stomach cancer Neg Hx   ? ? ?Social History  ? ?Socioeconomic History  ?? Marital status: Widowed  ?  Spouse name: Not on file  ?? Number of children: 2  ?? Years of education: Not on file  ?? Highest education level: Not on file  ?Occupational History  ?? Occupation: CONSULTANT  ?  Employer: [employer]  ?  Comment: Retired  ?Tobacco Use  ?? Smoking status: Never  ?  Passive exposure: Yes  ?? Smokeless tobacco: Never  ?? Tobacco comments:  ?  Husband smoked  ?Vaping Use  ?? Vaping Use: Never used  ?Substance and Sexual Activity  ?? Alcohol use: Yes  ?  Comment: occasional glass of wine  ?? Drug use: No  ??  Sexual activity: Not Currently  ?Other Topics Concern  ?? Not on file  ?Social History Narrative  ? Reviewed 12/2013  ? Regular exercise-yes-intermittantly at [employer]  ? Diet: fruits and veggies  ?   ? Right handed  ?   ? 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   ? Lives  brother in law and his daughter and grandson  Husband passed away  ? ?Social Determinants of Health  ? ?Financial Resource Strain: Low Risk   ?? Difficulty of Paying Living Expenses: Not very hard  ?Food Insecurity: No Food Insecurity  ?? Worried About Charity fundraiser in the Last Year: Never true  ?? Ran Out of Food in the Last Year: Never true  ?Transportation Needs: No Transportation Needs  ?? Lack of Transportation (Medical): No  ?? Lack of Transportation (Non-Medical): No  ?Physical Activity: Insufficiently Active  ?? Days of Exercise per Week: 3 days  ?? Minutes of Exercise per Session: 30 min  ?Stress: Not on file  ?Social Connections:  Moderately Integrated  ?? Frequency of Communication with Friends and Family: Three times a week  ?? Frequency of Social Gatherings with Friends and Family: Three times a week  ?? Attends Religious Services: More than 4 times per year  ?? Active Member of Clubs or Organizations: Yes  ?? Attends Archivist Meetings: 1 to 4 times per year  ?? Marital Status: Widowed  ?Intimate Partner Violence: Not At Risk  ?? Fear of Current or Ex-Partner: No  ?? Emotionally Abused: No  ?? Physically Abused: No  ?? Sexually Abused: No  ? ? ?Outpatient Medications Prior to Visit  ?Medication Sig Dispense Refill  ?? acetaminophen (TYLENOL) 500 MG tablet Take 1,000 mg by mouth every 4 (four) hours as needed for mild pain, moderate pain or headache.     ?? albuterol (VENTOLIN HFA) 108 (90 Base) MCG/ACT inhaler Inhale 2 puffs into the lungs every 6 (six) hours as needed for wheezing or shortness of breath. 18 g 12  ?? Cholecalciferol (VITAMIN D) 50 MCG (2000 UT) tablet Take 2,000 Units by mouth daily.    ?? diclofenac Sodium (VOLTAREN) 1 % GEL Apply 1 application topically 2 (two) times daily as needed (pain).    ?? DULoxetine (CYMBALTA) 30 MG capsule Take 1 tablet every night 90 capsule 3  ?? Fluticasone-Umeclidin-Vilant (TRELEGY ELLIPTA) 100-62.5-25 MCG/ACT AEPB Inhale 1 puff then rinse mouth once daily 60 each 12  ?? gabapentin (NEURONTIN) 600 MG tablet Take 2 tablets three times a day 540 tablet 3  ?? ipratropium-albuterol (DUONEB) 0.5-2.5 (3) MG/3ML SOLN Take 3 mLs by nebulization every 6 (six) hours as needed. 150 mL 12  ?? Multiple Vitamins-Minerals (GNP HAIR/SKIN/NAILS PO) Take 3 tablets by mouth daily.    ?? nortriptyline (PAMELOR) 50 MG capsule Take 3 capsules every night 270 capsule 3  ?? ondansetron (ZOFRAN) 4 MG tablet Take 4 mg by mouth every 8 (eight) hours as needed.    ?? Potassium Citrate 15 MEQ (1620 MG) TBCR Take 2 tablets by mouth in the morning and at bedtime. 120 tablet 11  ?? SF 5000 PLUS 1.1 % CREA  dental cream Place 1 application onto teeth at bedtime.    ?? traMADol (ULTRAM) 50 MG tablet TAKE 1/2 TO 1 (ONE-HALF TO ONE) TABLET BY MOUTH ONCE DAILY AS NEEDED FOR SEVERE PAIN 30 tablet 5  ?? vitamin B-12 (CYANOCOBALAMIN) 1000 MCG tablet Take 1,000 mcg by mouth daily.    ? ?  No facility-administered medications prior to visit.  ? ? ?Allergies  ?Allergen Reactions  ?? Penicillins Anaphylaxis, Rash and Other (See Comments)  ?  Has patient had a PCN reaction causing immediate rash, facial/tongue/throat swelling, SOB or lightheadedness with hypotension: Yes ?Has patient had a PCN reaction causing severe rash involving mucus membranes or skin necrosis: No ?Has patient had a PCN reaction that required hospitalization No ?Has patient had a PCN reaction occurring within the last 10 years: No ?If all of the above answers are "NO", then may proceed with Cephalosporin use.  ?? Venlafaxine Nausea Only  ?? Sulfonamide Derivatives Rash  ? ? ?ROS ?Review of Systems  ?Constitutional:  Negative for chills and fever.  ?HENT:  Positive for voice change. Negative for congestion, ear pain, sinus pressure and sore throat.   ?Respiratory:  Positive for cough, chest tightness, shortness of breath and wheezing.   ?Cardiovascular:  Negative for chest pain and palpitations.  ? ?  ?Objective:  ?  ?Physical Exam ?Constitutional:   ?   General: She is not in acute distress. ?   Appearance: Normal appearance. She is normal weight. She is not ill-appearing, toxic-appearing or diaphoretic.  ?HENT:  ?   Head: Normocephalic.  ?   Right Ear: Tympanic membrane normal.  ?   Left Ear: Tympanic membrane normal.  ?   Nose: Nose normal.  ?   Mouth/Throat:  ?   Mouth: Mucous membranes are moist.  ?Eyes:  ?   Pupils: Pupils are equal, round, and reactive to light.  ?Cardiovascular:  ?   Rate and Rhythm: Normal rate and regular rhythm.  ?Pulmonary:  ?   Effort: Pulmonary effort is normal.  ?   Breath sounds: Normal breath sounds.  ?Neurological:  ?   Mental  Status: She is alert.  ? ? ?BP 124/82   Pulse 88   Temp 98.4 ?F (36.9 ?C)   Resp 16   Ht 4' 10.5" (1.486 m)   Wt 155 lb 8 oz (70.5 kg)   SpO2 95%   BMI 31.95 kg/m?  ?Wt Readings from Last 3 Encounters:  ?09/28/21 15

## 2021-09-28 NOTE — Assessment & Plan Note (Signed)
rx prednisone 20 mg taper  ?Monitor for worsening sob or worsening of sx ?

## 2021-09-28 NOTE — Patient Instructions (Addendum)
Antibiotic sent to preferred pharmacy.  ?Steroid also sent to pharmacy ?Albuterol as needed ?Recommend plain mucinex  ? ?Please increase oral fluids, steamy hot shower/humidifier prn. ? ?Please follow up if no improvement in 2-3 days.  ? ?It was a pleasure seeing you today! Please do not hesitate to reach out with any questions and or concerns. ? ?Regards,  ? ?Aphrodite Harpenau ? ?

## 2021-09-28 NOTE — Assessment & Plan Note (Signed)
rx zpack 250 mg  Take antibiotic as prescribed. Increase oral fluids. Pt to f/u if sx worsen and or fail to improve in 2-3 days.  

## 2021-10-05 ENCOUNTER — Other Ambulatory Visit: Payer: Self-pay | Admitting: Internal Medicine

## 2021-10-13 ENCOUNTER — Telehealth: Payer: Self-pay | Admitting: Family Medicine

## 2021-10-13 DIAGNOSIS — E7800 Pure hypercholesterolemia, unspecified: Secondary | ICD-10-CM

## 2021-10-13 DIAGNOSIS — R7303 Prediabetes: Secondary | ICD-10-CM

## 2021-10-13 NOTE — Telephone Encounter (Signed)
-----   Message from Velna Hatchet, RT sent at 10/11/2021  2:51 PM EDT ----- ?Regarding: Lab Fri 10/29/21 ?Lab orders for a 6 month follow up appt, please.  Thanks,  Anda Kraft ? ?

## 2021-10-29 ENCOUNTER — Other Ambulatory Visit (INDEPENDENT_AMBULATORY_CARE_PROVIDER_SITE_OTHER): Payer: Medicare Other

## 2021-10-29 DIAGNOSIS — R7303 Prediabetes: Secondary | ICD-10-CM

## 2021-10-29 DIAGNOSIS — E7800 Pure hypercholesterolemia, unspecified: Secondary | ICD-10-CM

## 2021-10-29 LAB — COMPREHENSIVE METABOLIC PANEL
ALT: 11 U/L (ref 0–35)
AST: 15 U/L (ref 0–37)
Albumin: 3.9 g/dL (ref 3.5–5.2)
Alkaline Phosphatase: 96 U/L (ref 39–117)
BUN: 12 mg/dL (ref 6–23)
CO2: 31 mEq/L (ref 19–32)
Calcium: 9.4 mg/dL (ref 8.4–10.5)
Chloride: 106 mEq/L (ref 96–112)
Creatinine, Ser: 0.92 mg/dL (ref 0.40–1.20)
GFR: 60.77 mL/min (ref >60.00)
Glucose, Bld: 101 mg/dL — ABNORMAL HIGH (ref 70–99)
Potassium: 3.6 mEq/L (ref 3.5–5.1)
Sodium: 143 mEq/L (ref 135–145)
Total Bilirubin: 0.4 mg/dL (ref 0.2–1.2)
Total Protein: 7 g/dL (ref 6.0–8.3)

## 2021-10-29 LAB — HEMOGLOBIN A1C: Hgb A1c MFr Bld: 5.9 % (ref 4.6–6.5)

## 2021-10-29 LAB — LIPID PANEL
Cholesterol: 194 mg/dL (ref 0–200)
HDL: 55.3 mg/dL (ref >39.00)
LDL Cholesterol: 99 mg/dL (ref 0–99)
NonHDL: 138.66
Total CHOL/HDL Ratio: 4
Triglycerides: 196 mg/dL — ABNORMAL HIGH (ref 0.0–149.0)
VLDL: 39.2 mg/dL (ref 0.0–40.0)

## 2021-10-29 NOTE — Progress Notes (Signed)
No critical labs need to be addressed urgently. We will discuss labs in detail at upcoming office visit.   

## 2021-11-04 ENCOUNTER — Ambulatory Visit: Payer: Medicare Other | Admitting: Family Medicine

## 2021-11-04 ENCOUNTER — Ambulatory Visit: Payer: Medicare Other | Admitting: Internal Medicine

## 2021-11-06 NOTE — Progress Notes (Deleted)
-HPI  female never smoker(prolonged second hand smoke) followed for chronic bronchitis, complicated by right breast cancer/XRT, GERD, Postherpetic Neuralgia,  Office Spirometry 10/16/2017-moderate restriction of exhaled volume without obvious obstruction.  FVC 1.57/68%, FEV1 1.27/73%, ratio 0.81, FEF 25-75% 1.37/88% Echocardiogram EF 84-69, grade 1 diastolic dysfunction PFT 11/26/5282-XLKG restriction, minimal diffusion defect.  FVC 1.72/71%, FEV1 1.53/85%, ratio 0.89, TLC 69%, DLCO 74% Labs 11/17/2017-IgE 241 H, Eos  0.5, BNP 69 FENO 06/20/2018-- 11 WNL --------------------------------------------------------------   07/06/21-76 year old female never smoker(prolonged second hand smoke) followed for chronic bronchitis/ COPD, complicated by right Breast Cancer/XRT, GERD , Postherpetic Neuralgia, Obesity, Kidney Stone,  -Trelegy 100, Ventolin hfa Covid vax-3 Phizer Flu vax-had -----Patient feels like her breathing is about the same since last visit. States that she is coughing a lot at night and has to use her rescue inhaler to get it to stop. Cough intensity varies with weather.  Not sure what other factors are involved but tends to be worse when she is lying down in the evening.  She feels as if she is coughing mucus up from her chest and denies any recognition of reflux or postnasal drip.  Inhalers do help.  Some persistent wheeze and white/yellow sputum noted. CXR 01/19/21 1V-  IMPRESSION: No active disease.  4136- 76 year old female never smoker(prolonged second hand smoke) followed for chronic bronchitis/ COPD, complicated by right Breast Cancer/XRT, GERD , Postherpetic Neuralgia, Obesity, Kidney Stone,  -Trelegy 100, Ventolin hfa Covid vax-3 Phizer Flu vax-had ECHO 07/07/21- low normal EF, mild MR  CXR 07/06/21-  IMPRESSION: No acute abnormalities. 11 x 5 mm LEFT renal calculus with LEFT ureteral stent noted. Aortic Atherosclerosis (ICD10-I70.0).  ROS-see HPI  + =  positive Constitutional:   No-   weight loss, night sweats, fevers, chills, fatigue, lassitude. HEENT:   No-  headaches, difficulty swallowing, tooth/dental problems, sore throat,       No-  sneezing, itching, ear ache, nasal congestion, post nasal drip,  CV:  No-   chest pain, orthopnea, PND, swelling in lower extremities, anasarca,                                      dizziness, palpitations Resp: +shortness of breath with exertion or at rest.             +productive cough,  + non-productive cough,  No- coughing up of blood.            +change in color of mucus.   +wheezing.   Skin: No-   rash or lesions. GI:  No-   heartburn, indigestion, abdominal pain, nausea, vomiting, diarrhea,                 change in bowel habits, loss of appetite GU:  MS:  No-   joint pain or swelling.   Neuro-    + Falls, + postherpetic neuralgia Psych:  No- change in mood or affect. No depression or anxiety.  No memory loss.  OBJ- Physical Exam General- Alert, Oriented, Affect-appropriate, Distress- none acute, + obese Skin- rash-none, lesions- none, excoriation- none Lymphadenopathy- none Head- atraumatic            Eyes- Gross vision intact, PERRLA, conjunctivae and secretions clear            Ears- Hearing, canals-normal            Nose- Clear, no-Septal dev, mucus, polyps, erosion, perforation  Throat- Mallampati III , mucosa clear , drainage- none, tonsils- atrophic Neck- flexible , trachea midline, no stridor , thyroid nl, carotid no bruit Chest - symmetrical excursion , unlabored           Heart/CV- RRR , no murmur , no gallop  , no rub, nl s1 s2                           - JVD- none , edema- none, stasis changes- none, varices- none           Lung- + clear, wheeze-none, cough+barking , dullness-none, rub- none           Chest wall-  +Hx R lumpectomy/XRT Abd-  Br/ Gen/ Rectal- Not done, not indicated Extrem- cyanosis- none, clubbing, none, atrophy- none, strength- nl Neuro- grossly  intact to observation

## 2021-11-08 ENCOUNTER — Ambulatory Visit: Payer: Medicare Other | Admitting: Internal Medicine

## 2021-11-10 DIAGNOSIS — T1511XA Foreign body in conjunctival sac, right eye, initial encounter: Secondary | ICD-10-CM | POA: Diagnosis not present

## 2021-11-14 NOTE — Progress Notes (Unsigned)
-HPI  female never smoker(prolonged second hand smoke) followed for chronic bronchitis, complicated by right breast cancer/XRT, GERD, Postherpetic Neuralgia,  Office Spirometry 10/16/2017-moderate restriction of exhaled volume without obvious obstruction.  FVC 1.57/68%, FEV1 1.27/73%, ratio 0.81, FEF 25-75% 1.37/88% Echocardiogram EF 47-42, grade 1 diastolic dysfunction PFT 5/95/6387-FIEP restriction, minimal diffusion defect.  FVC 1.72/71%, FEV1 1.53/85%, ratio 0.89, TLC 69%, DLCO 74% Labs 11/17/2017-IgE 241 H, Eos  0.5, BNP 69 FENO 06/20/2018-- 11 WNL --------------------------------------------------------------    07/06/21-76 year old female never smoker(prolonged second hand smoke) followed for chronic bronchitis/ COPD, complicated by right Breast Cancer/XRT, GERD , Postherpetic Neuralgia, Obesity, Kidney Stone,  -Trelegy 100, Ventolin hfa Covid vax-3 Phizer Flu vax-had -----Patient feels like her breathing is about the same since last visit. States that she is coughing a lot at night and has to use her rescue inhaler to get it to stop. Cough intensity varies with weather.  Not sure what other factors are involved but tends to be worse when she is lying down in the evening.  She feels as if she is coughing mucus up from her chest and denies any recognition of reflux or postnasal drip.  Inhalers do help.  Some persistent wheeze and white/yellow sputum noted. CXR 01/19/21 1V-  IMPRESSION: No active disease.  11/16/21- -76 year old female never smoker(prolonged second hand smoke) followed for chronic bronchitis/ COPD, complicated by right Breast Cancer/XRT, GERD , Postherpetic Neuralgia, Obesity, Kidney Stone,  -Trelegy 100, Ventolin hfa, neb Duoneb, Tessalon,  Covid vax-3 Phizer Flu vax-had -----Follow up.  Still has chronic cough but was unable to collect a sputum specimen.  Breathing feels stable.  Trelegy too expensive and we discussed price comparison with Breztri before looking at other  options.  Incidental headache today. Discussed her work-up for kidney stone. CXR 07/06/21- IMPRESSION: No acute abnormalities. 11 x 5 mm LEFT renal calculus with LEFT ureteral stent noted. Aortic Atherosclerosis (ICD10-I70.0).  ROS-see HPI  + = positive Constitutional:   No-   weight loss, night sweats, fevers, chills, fatigue, lassitude. HEENT:   +headaches, difficulty swallowing, tooth/dental problems, sore throat,       No-  sneezing, itching, ear ache, nasal congestion, post nasal drip,  CV:  No-   chest pain, orthopnea, PND, swelling in lower extremities, anasarca,                                      dizziness, palpitations Resp: +shortness of breath with exertion or at rest.             +productive cough,  + non-productive cough,  No- coughing up of blood.            +change in color of mucus.   +wheezing.   Skin: No-   rash or lesions. GI:  No-   heartburn, indigestion, abdominal pain, nausea, vomiting, diarrhea,                 change in bowel habits, loss of appetite GU:  MS:  No-   joint pain or swelling.   Neuro-    + Falls, + postherpetic neuralgia Psych:  No- change in mood or affect. No depression or anxiety.  No memory loss.  OBJ- Physical Exam General- Alert, Oriented, Affect-appropriate, Distress- none acute, + obese Skin- rash-none, lesions- none, excoriation- none Lymphadenopathy- none Head- atraumatic            Eyes- Gross vision intact, PERRLA, conjunctivae  and secretions clear            Ears- Hearing, canals-normal            Nose- Clear, no-Septal dev, mucus, polyps, erosion, perforation             Throat- Mallampati III , mucosa clear , drainage- none, tonsils- atrophic Neck- flexible , trachea midline, no stridor , thyroid nl, carotid no bruit Chest - symmetrical excursion , unlabored           Heart/CV- RRR , no murmur , no gallop  , no rub, nl s1 s2                           - JVD- none , edema- none, stasis changes- none, varices- none            Lung- + clear, wheeze-none, cough+barking(?tracheomalacia) , dullness-none, rub- none           Chest wall-  +Hx R lumpectomy/XRT Abd-  Br/ Gen/ Rectal- Not done, not indicated Extrem- cyanosis- none, clubbing, none, atrophy- none, strength- nl Neuro- grossly intact to observation

## 2021-11-16 ENCOUNTER — Ambulatory Visit (INDEPENDENT_AMBULATORY_CARE_PROVIDER_SITE_OTHER): Payer: Medicare Other | Admitting: Internal Medicine

## 2021-11-16 ENCOUNTER — Encounter: Payer: Self-pay | Admitting: Internal Medicine

## 2021-11-16 DIAGNOSIS — J410 Simple chronic bronchitis: Secondary | ICD-10-CM

## 2021-11-16 DIAGNOSIS — R519 Headache, unspecified: Secondary | ICD-10-CM | POA: Diagnosis not present

## 2021-11-16 MED ORDER — BREZTRI AEROSPHERE 160-9-4.8 MCG/ACT IN AERO: INHALATION_SPRAY | RESPIRATORY_TRACT | 12 refills | Status: DC

## 2021-11-16 MED ORDER — BREZTRI AEROSPHERE 160-9-4.8 MCG/ACT IN AERO: 2.0000 | INHALATION_SPRAY | Freq: Two times a day (BID) | RESPIRATORY_TRACT | 0 refills | Status: DC

## 2021-11-16 NOTE — Patient Instructions (Signed)
Sample and script printed for Breztri inhaler    inhale 2 puffs then rinse mouth, twice daily Try this instead of Trelegy.   Please call if we can help

## 2021-11-16 NOTE — Assessment & Plan Note (Signed)
Reports incidental headache at this visit and indicates she has medication at home to deal with it.

## 2021-11-16 NOTE — Assessment & Plan Note (Signed)
Chronic bronchitis following long secondhand smoke exposure while married. Plan-Trelegy was too expensive.  Try changing it to [facility] sample and prescription with discussion

## 2021-12-02 ENCOUNTER — Encounter: Payer: Self-pay | Admitting: Family Medicine

## 2021-12-02 ENCOUNTER — Telehealth: Payer: Self-pay

## 2021-12-02 ENCOUNTER — Ambulatory Visit (INDEPENDENT_AMBULATORY_CARE_PROVIDER_SITE_OTHER): Payer: Medicare Other | Admitting: Family Medicine

## 2021-12-02 VITALS — BP 130/74 | HR 86 | Temp 98.8°F | Ht <= 58 in | Wt 141.2 lb

## 2021-12-02 DIAGNOSIS — F22 Delusional disorders: Secondary | ICD-10-CM | POA: Diagnosis not present

## 2021-12-02 DIAGNOSIS — E663 Overweight: Secondary | ICD-10-CM | POA: Diagnosis not present

## 2021-12-02 DIAGNOSIS — R7303 Prediabetes: Secondary | ICD-10-CM | POA: Diagnosis not present

## 2021-12-02 DIAGNOSIS — E7800 Pure hypercholesterolemia, unspecified: Secondary | ICD-10-CM

## 2021-12-02 NOTE — Progress Notes (Signed)
Patient ID: Katie Zimmerman, female    DOB: [birth year], 76 y.o.   MRN: [id number]  This visit was conducted in person.  BP 130/74   Pulse 86   Temp 98.8 F (37.1 C) (Oral)   Ht 4' 9.75" (1.467 m)   Wt 141 lb 4 oz (64.1 kg)   SpO2 95%   BMI 29.78 kg/m    CC:  Chief Complaint  Patient presents with   Follow-up    Labs    Subjective:   HPI: Katie Zimmerman is a 76 y.o. female presenting on 12/02/2021 for Follow-up (Labs)  She presents today for follow-up appointment.   Earlier today I received a message from her sister who reports that she has been having episodes of confusion and has been stating that she " wanted to let us know that she is really concerned for her sisters mental/cognitive as Eren believes is has been in contact with Marciano Sequin, who is the Nutritional therapist of [employer], she also is in the mindset that she has won a lot of money. Stefanny has told her family that she is going to meet up with "Elta Guadeloupe" after her appointment today and if everything goes well that she will not be returning back home for awhile. When they try bringing it up Chauncey just shuts down and says that she is 79 and can do whatever she pleases. Jaena has also tried meeting up with this person before at night, knowing she isnt supposed to be driving at night. Harriett is just concerned about all this and wanted to bring the attention to her pcp. Harriett says that her sister has lost her husband two years ago, and after that she was really dependent on their pastor who retired about a month ago when all of this started.   Of note she is on high doses of gabapentin,  nortriptyline, tramadol ( rarely using) and Cymbalta for her poorly controlled postherpetic neuralgia. Pain control is better on this regimen. Followed by Neuro Dr. Delice Lesch.  No sedation  She denies depression., no anxiety.  I am doing well except for when " my sister trys to run my life"  She does not give permission for me to share info with her sister.  In  conversation with patient she is clear and able to converse, remember things etc.   She had removal of  30 mm kidney stone in 05/2021 Dr. Claudie Leach.    Reviewed labs in detail. She is getting ready to start  a diet Octavia with  6 smaller meals a day, higher protein, low carb.  Prediabetes stable  Lab Results  Component Value Date   HGBA1C 5.9 10/29/2021   CMET normal .  Cholesterol LDL at goal but given age/risk factors she is at increased risk for CVD.   Wt Readings from Last 3 Encounters:  12/02/21 141 lb 4 oz (64.1 kg)  11/16/21 153 lb 6.4 oz (69.6 kg)  09/28/21 155 lb 8 oz (70.5 kg)  Body mass index is 29.78 kg/m.   Relevant past medical, surgical, family and social history reviewed and updated as indicated. Interim medical history since our last visit reviewed. Allergies and medications reviewed and updated. Outpatient Medications Prior to Visit  Medication Sig Dispense Refill   acetaminophen (TYLENOL) 500 MG tablet Take 1,000 mg by mouth every 4 (four) hours as needed for mild pain, moderate pain or headache.      albuterol (VENTOLIN HFA) 108 (90 Base) MCG/ACT inhaler INHALE 2 PUFFS  BY MOUTH EVERY 6 HOURS AS NEEDED FOR WHEEZING FOR SHORTNESS OF BREATH 9 g 5   benzonatate (TESSALON) 200 MG capsule Take 1 capsule (200 mg total) by mouth 3 (three) times daily as needed for cough. 20 capsule 0   Budeson-Glycopyrrol-Formoterol (BREZTRI AEROSPHERE) 160-9-4.8 MCG/ACT AERO Inhale 2 puffs into the lungs in the morning and at bedtime. 10.7 g 0   Cholecalciferol (VITAMIN D) 50 MCG (2000 UT) tablet Take 2,000 Units by mouth daily.     diclofenac Sodium (VOLTAREN) 1 % GEL Apply 1 application topically 2 (two) times daily as needed (pain).     DULoxetine (CYMBALTA) 30 MG capsule Take 1 tablet every night 90 capsule 3   gabapentin (NEURONTIN) 600 MG tablet Take 2 tablets three times a day 540 tablet 3   ipratropium-albuterol (DUONEB) 0.5-2.5 (3) MG/3ML SOLN Take 3 mLs by nebulization every 6  (six) hours as needed. 150 mL 12   Multiple Vitamins-Minerals (GNP HAIR/SKIN/NAILS PO) Take 3 tablets by mouth daily.     nortriptyline (PAMELOR) 50 MG capsule Take 3 capsules every night 270 capsule 3   ondansetron (ZOFRAN) 4 MG tablet Take 4 mg by mouth every 8 (eight) hours as needed.     Potassium Citrate 15 MEQ (1620 MG) TBCR Take 2 tablets by mouth in the morning and at bedtime. 120 tablet 11   SF 5000 PLUS 1.1 % CREA dental cream Place 1 application onto teeth at bedtime.     traMADol (ULTRAM) 50 MG tablet TAKE 1/2 TO 1 (ONE-HALF TO ONE) TABLET BY MOUTH ONCE DAILY AS NEEDED FOR SEVERE PAIN 30 tablet 5   vitamin B-12 (CYANOCOBALAMIN) 1000 MCG tablet Take 1,000 mcg by mouth daily.     Budeson-Glycopyrrol-Formoterol (BREZTRI AEROSPHERE) 160-9-4.8 MCG/ACT AERO Inhale 2 puffs then rinse mouth, twice daily 10.7 g 12   predniSONE (DELTASONE) 20 MG tablet Take two tablets daily for 3 days followed by one tablet daily for 4 days 10 tablet 0   No facility-administered medications prior to visit.     Per HPI unless specifically indicated in ROS section below Review of Systems  Constitutional:  Negative for fatigue and fever.  HENT:  Negative for congestion.   Eyes:  Negative for pain.  Respiratory:  Negative for cough and shortness of breath.   Cardiovascular:  Negative for chest pain, palpitations and leg swelling.  Gastrointestinal:  Negative for abdominal pain.  Genitourinary:  Negative for dysuria and vaginal bleeding.  Musculoskeletal:  Negative for back pain.  Neurological:  Negative for syncope, light-headedness and headaches.  Psychiatric/Behavioral:  Negative for dysphoric mood.    Objective:  BP 130/74   Pulse 86   Temp 98.8 F (37.1 C) (Oral)   Ht 4' 9.75" (1.467 m)   Wt 141 lb 4 oz (64.1 kg)   SpO2 95%   BMI 29.78 kg/m   Wt Readings from Last 3 Encounters:  12/02/21 141 lb 4 oz (64.1 kg)  11/16/21 153 lb 6.4 oz (69.6 kg)  09/28/21 155 lb 8 oz (70.5 kg)      Physical  Exam Constitutional:      General: She is not in acute distress.    Appearance: Normal appearance. She is well-developed. She is not ill-appearing or toxic-appearing.  HENT:     Head: Normocephalic.     Right Ear: Hearing, tympanic membrane, ear canal and external ear normal. Tympanic membrane is not erythematous, retracted or bulging.     Left Ear: Hearing, tympanic membrane, ear canal and external  ear normal. Tympanic membrane is not erythematous, retracted or bulging.     Nose: No mucosal edema or rhinorrhea.     Right Sinus: No maxillary sinus tenderness or frontal sinus tenderness.     Left Sinus: No maxillary sinus tenderness or frontal sinus tenderness.     Mouth/Throat:     Pharynx: Uvula midline.  Eyes:     General: Lids are normal. Lids are everted, no foreign bodies appreciated.     Conjunctiva/sclera: Conjunctivae normal.     Pupils: Pupils are equal, round, and reactive to light.  Neck:     Thyroid: No thyroid mass or thyromegaly.     Vascular: No carotid bruit.     Trachea: Trachea normal.  Cardiovascular:     Rate and Rhythm: Normal rate and regular rhythm.     Pulses: Normal pulses.     Heart sounds: Normal heart sounds, S1 normal and S2 normal. No murmur heard.    No friction rub. No gallop.  Pulmonary:     Effort: Pulmonary effort is normal. No tachypnea or respiratory distress.     Breath sounds: Normal breath sounds. No decreased breath sounds, wheezing, rhonchi or rales.  Abdominal:     General: Bowel sounds are normal.     Palpations: Abdomen is soft.     Tenderness: There is no abdominal tenderness.  Musculoskeletal:     Cervical back: Normal range of motion and neck supple.  Skin:    General: Skin is warm and dry.     Findings: No rash.  Neurological:     Mental Status: She is alert and oriented to person, place, and time.     Cranial Nerves: Cranial nerves 2-12 are intact.     Sensory: Sensation is intact.     Motor: Motor function is intact.      Coordination: Coordination is intact.     Gait: Gait is intact.     Deep Tendon Reflexes: Reflexes are normal and symmetric.  Psychiatric:        Attention and Perception: Attention and perception normal.        Mood and Affect: Mood and affect normal. Mood is not anxious or depressed.        Speech: Speech normal.        Behavior: Behavior normal. Behavior is cooperative.        Thought Content: Thought content is delusional. Thought content does not include homicidal or suicidal ideation. Thought content does not include homicidal or suicidal plan.        Cognition and Memory: Cognition and memory normal. Cognition is not impaired. Memory is not impaired. She does not exhibit impaired recent memory or impaired remote memory.        Judgment: Judgment normal.       MMSE  30/30 nml neuro exam. Results for orders placed or performed in visit on 10/29/21  Comprehensive metabolic panel  Result Value Ref Range   Sodium 143 135 - 145 mEq/L   Potassium 3.6 3.5 - 5.1 mEq/L   Chloride 106 96 - 112 mEq/L   CO2 31 19 - 32 mEq/L   Glucose, Bld 101 (H) 70 - 99 mg/dL   BUN 12 6 - 23 mg/dL   Creatinine, Ser 0.92 0.40 - 1.20 mg/dL   Total Bilirubin 0.4 0.2 - 1.2 mg/dL   Alkaline Phosphatase 96 39 - 117 U/L   AST 15 0 - 37 U/L   ALT 11 0 - 35 U/L   Total  Protein 7.0 6.0 - 8.3 g/dL   Albumin 3.9 3.5 - 5.2 g/dL   GFR 60.77 >60.00 mL/min   Calcium 9.4 8.4 - 10.5 mg/dL  Lipid panel  Result Value Ref Range   Cholesterol 194 0 - 200 mg/dL   Triglycerides 196.0 (H) 0.0 - 149.0 mg/dL   HDL 55.30 >39.00 mg/dL   VLDL 39.2 0.0 - 40.0 mg/dL   LDL Cholesterol 99 0 - 99 mg/dL   Total CHOL/HDL Ratio 4    NonHDL 138.66   Hemoglobin A1c  Result Value Ref Range   Hgb A1c MFr Bld 5.9 4.6 - 6.5 %     COVID 19 screen:  No recent travel or known exposure to COVID19 The patient denies respiratory symptoms of COVID 19 at this time. The importance of social distancing was discussed today.   Assessment and  Plan    Problem List Items Addressed This Visit     Class 2 severe obesity due to excess calories with serious comorbidity and body mass index (BMI) of 36.0 to 36.9 in adult [facility])    Encouraged exercise, weight loss, healthy eating habits.       Delusional thoughts ([place])    New, otherwise normal cognition and memory.  Normal Mini-Mental status exam.  Recent labs within normal range.  Patient has no known history o delusional thoughts or behavior.  She denies depression or anxiety.  I will contact her family to gather more information on current issues.  She is on several medications including gabapentin, nortriptyline, Cymbalta, tramadol that could contribute to behavior and mental status changes, but she is not sedated and has very specific delusional thoughts regarding [name].  I will contact neurology to make sure there have been no changes in dosing.      High cholesterol - Primary     LDL at goal but she is at increased risk for cardiovascular disease.  We can consider further treatment of this once other acute issues have been evaluated.      Prediabetes    Chronic, stable control        Eliezer Lofts, MD

## 2021-12-02 NOTE — Telephone Encounter (Signed)
I have noted her sisters concern for confusion and hallucination.  We will look into evaluation of her mental status at today's appointment

## 2021-12-02 NOTE — Telephone Encounter (Signed)
Patient's sister, Harriett called in wanting to let us know that she is really concerned for her mental/cognitive as Amnah believes is has been in contact with Marciano Sequin, who is the creator of [employer], she also is in the mindset that she has won a lot of money. Chyane has told her family that she is going to meet up with "Elta Guadeloupe" after her appointment today and if everything goes well that she will not be returning back home for awhile. When they try bringing it up Ajia just shuts down and says that she is 67 and can do whatever she pleases. Shilpa has also tried meeting up with this person before at night, knowing she isnt supposed to be driving at night. Harriett is just concerned about all this and wanted to bring the attention to her pcp. Harriett says that her sister has lost her husband two years ago, and after that she was really dependent on their pastor who retired about a month ago when all of this started.

## 2021-12-02 NOTE — Patient Instructions (Signed)
Continue current medications.  Call if new issues.
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
## 2021-12-06 NOTE — Telephone Encounter (Signed)
Patient Name: Katie Zimmerman Gender: Female DOB: 08/14/1945 Age: 76 Y 66 M 3 D Return Phone Number: [phone] (Primary) Address: City/ State/ Zip: [place] Client [place] Night - Client Client Site [place] Provider Eliezer Lofts - MD Contact Type Call Who Is Calling Patient / Member / Family / Caregiver Call Type Triage / Clinical Caller Name - Harriet  Relationship To Patient Sibling Return Phone Number [phone] (Primary) Chief Complaint Psychiatric Evaluation Reason for Call Symptomatic / Request for [place] states she is not on the list to see her sister. She think she spent the day at a motel with [employer]. Translation No No Triage Reason Other Nurse Assessment Nurse: Gloriann Loan, RN, Sharyn Lull Date/Time (Eastern Time): 12/04/2021 7:53:31 AM Confirm and document reason for call. If symptomatic, describe symptoms. ---Caller states she is not on the list to see her sister. She think she spent the day at a motel with [employer]. She is delusional and needing to send money to get a gift she was told she was awarded. She is not wanting to take advisement of family to not reach out to the scammers. She lost her husband recently. Lives with her niece and her husband. Her dtr is coming in today and wanting to see the patient in person. She did not come home last night per caller. She has not paid her living expenses. Caller contacted patient and she states she is at the [employer] and has been at the new motel with [employer].  Does the patient have any new or worsening symptoms? ---Yes Will a triage be completed? ---No Select reason for no triage. ---Other Please document clinical information provided and list any resource used. ---Basic information given vs medical information in regards to the patient since she is not the POA or with patient. Guidelines Guideline Title Affirmed Question Affirmed Notes  Nurse Date/Time (Eastern Time) Confusion - Delirium [1] Difficult to awaken or acting confused (e.g., disoriented, slurred speech)  [2] present now [3] new-onse Bell, RN, Sharyn Lull 12/04/2021 7:56:09 AM.  Gerlene Fee. Time Eilene Ghazi Time) Disposition Final User 12/04/2021 8:06:03 AM Call EMS 911 Now Yes Gloriann Loan, RN, Sharyn Lull 12/04/2021 8:15:29 AM 911 Outcome Documentation Gloriann Loan, RN, Sharyn Lull Reason: 911 not called due to caller not knowing where patient is, however, caller was connected to law enforcement for that county. 12/04/2021 8:06:03 AM Call EMS 911 Now Yes Gloriann Loan, RN, Julio Sicks Disagree/Comply Comply Caller Understands Yes PreDisposition Did not know what to do Care Advice Given Per Guideline CALL EMS 911 NOW: * Immediate medical attention is needed. You need to hang up and call 911 (or an ambulance). * Triager Discretion: I'll call you back in a few minutes to be sure you were able to reach them. CARE ADVICE given per Confusion-Delirium (Adult) guideline. Comments User: Leodis Sias, RN Date/Time Eilene Ghazi Time): 12/04/2021 8:06:33 AM Unsure if caller will contact EMS or police, advised to do so
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
## 2021-12-07 DIAGNOSIS — F22 Delusional disorders: Secondary | ICD-10-CM | POA: Insufficient documentation

## 2021-12-07 HISTORY — DX: Delusional disorders: F22

## 2021-12-07 NOTE — Telephone Encounter (Signed)
Spoke in detail with daughter , DPR signed, regarding recent events and episodes of delirium.  Per sheriff and magistrate pt cannot be involuntarily committed given she is not a direct harm to herself or others.  Daughter contacted behavioral health whos stated ER  visit for medical clearance was next step.Marland Kitchen daughter who lives 1 hour away states the patient will not go to the ER  There are some additional tests that could be done to make sure there is no  secondary cause to her recent delusions.   We will contact pt to have her return for additional testing. DAUGHTER STATES THAT PT WILL NOT COME IF SHE  THINKS HER SISTER OR DAUGHTER HAVE BEEN INVOLVED.   I have some level of concerns that she could be taking some of her medication incorrectly although MMSE was 30/30.Marland Kitchen we can consider trying to have her wean off gabapentin and tramadol [place] Urgent psychiatric referral but pt would have to be agreeable.   DONNA, please contact patient to ask her to come in for additional testing... just tell her after reviewing her labs and chart we need to check a few missed items.    FYI to lab to let them know  the situation.

## 2021-12-07 NOTE — Telephone Encounter (Signed)
Spoke with Katie Zimmerman and have her scheduled for labs tomorrow at 9:15 am.  Orders are in [place].   FYI to Dr. Diona Browner.

## 2021-12-07 NOTE — Telephone Encounter (Signed)
Thank you for the update!

## 2021-12-07 NOTE — Telephone Encounter (Signed)
I will call the family today at lunch.

## 2021-12-07 NOTE — Addendum Note (Signed)
Addended by: Eliezer Lofts E on: 12/07/2021 04:13 PM   Modules accepted: Orders

## 2021-12-08 ENCOUNTER — Other Ambulatory Visit (INDEPENDENT_AMBULATORY_CARE_PROVIDER_SITE_OTHER): Payer: Medicare Other

## 2021-12-08 DIAGNOSIS — F22 Delusional disorders: Secondary | ICD-10-CM

## 2021-12-08 LAB — CBC WITH DIFFERENTIAL/PLATELET
Basophils Absolute: 0.1 10*3/uL (ref 0.0–0.1)
Basophils Relative: 0.8 % (ref 0.0–3.0)
Eosinophils Absolute: 0.2 10*3/uL (ref 0.0–0.7)
Eosinophils Relative: 3.4 % (ref 0.0–5.0)
HCT: 41.4 % (ref 36.0–46.0)
Hemoglobin: 13.7 g/dL (ref 12.0–15.0)
Lymphocytes Relative: 29.2 % (ref 12.0–46.0)
Lymphs Abs: 1.8 10*3/uL (ref 0.7–4.0)
MCHC: 33.1 g/dL (ref 30.0–36.0)
MCV: 91.6 fl (ref 78.0–100.0)
Monocytes Absolute: 0.5 10*3/uL (ref 0.1–1.0)
Monocytes Relative: 8.1 % (ref 3.0–12.0)
Neutro Abs: 3.7 10*3/uL (ref 1.4–7.7)
Neutrophils Relative %: 58.5 % (ref 43.0–77.0)
Platelets: 302 10*3/uL (ref 150.0–400.0)
RBC: 4.52 Mil/uL (ref 3.87–5.11)
RDW: 13.8 % (ref 11.5–15.5)
WBC: 6.3 10*3/uL (ref 4.0–10.5)

## 2021-12-08 LAB — T3, FREE: T3, Free: 2.7 pg/mL (ref 2.3–4.2)

## 2021-12-08 LAB — TSH: TSH: 2.52 u[IU]/mL (ref 0.35–5.50)

## 2021-12-08 LAB — VITAMIN D 25 HYDROXY (VIT D DEFICIENCY, FRACTURES): VITD: 49.29 ng/mL (ref 30.00–100.00)

## 2021-12-08 LAB — T4, FREE: Free T4: 1.1 ng/dL (ref 0.60–1.60)

## 2021-12-08 LAB — VITAMIN B12: Vitamin B-12: 1499 pg/mL — ABNORMAL HIGH (ref 211–911)

## 2021-12-08 LAB — AMMONIA: Ammonia: 20 umol/L (ref 11–35)

## 2021-12-08 NOTE — Addendum Note (Signed)
Addended by: Ellamae Sia on: 12/08/2021 11:32 AM   Modules accepted: Orders

## 2021-12-08 NOTE — Addendum Note (Signed)
Addended by: Ellamae Sia on: 12/08/2021 09:40 AM   Modules accepted: Orders

## 2021-12-10 LAB — DRUG PROFILE, UR, 9 DRUGS (LABCORP)
Amphetamines, Urine: NEGATIVE ng/mL
Barbiturate Quant, Ur: NEGATIVE ng/mL
Benzodiazepine Quant, Ur: NEGATIVE ng/mL
Cannabinoid Quant, Ur: NEGATIVE ng/mL
Cocaine (Metab.): NEGATIVE ng/mL
Methadone Screen, Urine: NEGATIVE ng/mL
Opiate Quant, Ur: NEGATIVE ng/mL
PCP Quant, Ur: NEGATIVE ng/mL
Propoxyphene: NEGATIVE ng/mL

## 2021-12-27 ENCOUNTER — Encounter: Payer: Self-pay | Admitting: *Deleted

## 2021-12-29 ENCOUNTER — Telehealth: Payer: Self-pay

## 2021-12-29 NOTE — Chronic Care Management (AMB) (Addendum)
    Chronic Care Management Pharmacy Assistant   Name: Katie Zimmerman  MRN: [id number] DOB: 02-15-1946   Reason for Encounter: COPD Disease State  Attempted contact with Katie Zimmerman 3 times on 01/13/22,01/19/22,01/27/22. Unsuccessful outreach. Will attempt contact next month.    Recent office visits:  12/02/21-Katie Bedsole,MD(PCP)-f/u HLD,no medication changes f/u 6 months  Recent consult visits:  11/16/21-Katie Young,MD(pulmo)-f/u bronchitis,Plan-Trelegy was too expensive.  Try changing it to Katie Zimmerman sample and prescription with discussion  Hospital visits:  None in previous 6 months  Medications: Outpatient Encounter Medications as of 12/29/2021  Medication Sig   acetaminophen (TYLENOL) 500 MG tablet Take 1,000 mg by mouth every 4 (four) hours as needed for mild pain, moderate pain or headache.    albuterol (VENTOLIN HFA) 108 (90 Base) MCG/ACT inhaler INHALE 2 PUFFS BY MOUTH EVERY 6 HOURS AS NEEDED FOR WHEEZING FOR SHORTNESS OF BREATH   benzonatate (TESSALON) 200 MG capsule Take 1 capsule (200 mg total) by mouth 3 (three) times daily as needed for cough.   Budeson-Glycopyrrol-Formoterol (BREZTRI AEROSPHERE) 160-9-4.8 MCG/ACT AERO Inhale 2 puffs into the lungs in the morning and at bedtime.   Cholecalciferol (VITAMIN D) 50 MCG (2000 UT) tablet Take 2,000 Units by mouth daily.   diclofenac Sodium (VOLTAREN) 1 % GEL Apply 1 application topically 2 (two) times daily as needed (pain).   DULoxetine (CYMBALTA) 30 MG capsule Take 1 tablet every night   gabapentin (NEURONTIN) 600 MG tablet Take 2 tablets three times a day   ipratropium-albuterol (DUONEB) 0.5-2.5 (3) MG/3ML SOLN Take 3 mLs by nebulization every 6 (six) hours as needed.   Multiple Vitamins-Minerals (GNP HAIR/SKIN/NAILS PO) Take 3 tablets by mouth daily.   nortriptyline (PAMELOR) 50 MG capsule Take 3 capsules every night   ondansetron (ZOFRAN) 4 MG tablet Take 4 mg by mouth every 8 (eight) hours as needed.   Potassium Citrate 15 MEQ  (1620 MG) TBCR Take 2 tablets by mouth in the morning and at bedtime.   SF 5000 PLUS 1.1 % CREA dental cream Place 1 application onto teeth at bedtime.   traMADol (ULTRAM) 50 MG tablet TAKE 1/2 TO 1 (ONE-HALF TO ONE) TABLET BY MOUTH ONCE DAILY AS NEEDED FOR SEVERE PAIN   vitamin B-12 (CYANOCOBALAMIN) 1000 MCG tablet Take 1,000 mcg by mouth daily.   No facility-administered encounter medications on file as of 12/29/2021.     Unsuccessful outreach, attempt to call - constant busy signal   Current COPD regimen: Breztri - 2 puffs twice daily   Any recent hospitalizations or ED visits since last visit with CPP? No  What recent interventions/DTPs have been made by any provider to improve breathing since last visit:  changed from Trelegy( too expensive) to Katie Zimmerman   Adherence Review: Does the patient have >5 day gap between last estimated fill date for maintenance inhaler medications? No    Breztri 12/14/21  30ds   PCP appointment on 12/31/21  Care Gaps: Annual wellness visit in last year? Yes Most Recent BP reading:130/74  86-P  12/02/21  Star Rated Drugs Medication Name  Last Fill Date  Day supply No star medications identified  Katie Zimmerman, CPP notified  Katie Zimmerman, [place]  [phone]

## 2021-12-31 ENCOUNTER — Ambulatory Visit (INDEPENDENT_AMBULATORY_CARE_PROVIDER_SITE_OTHER): Payer: Medicare Other | Admitting: Family Medicine

## 2021-12-31 ENCOUNTER — Encounter: Payer: Self-pay | Admitting: Family Medicine

## 2021-12-31 VITALS — BP 130/84 | HR 93 | Temp 98.0°F | Ht <= 58 in | Wt 136.5 lb

## 2021-12-31 DIAGNOSIS — F22 Delusional disorders: Secondary | ICD-10-CM | POA: Diagnosis not present
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
## 2021-12-31 NOTE — Patient Instructions (Signed)
Limit tramadol as able.

## 2022-01-04 ENCOUNTER — Telehealth: Payer: Self-pay | Admitting: Family Medicine

## 2022-01-04 NOTE — Telephone Encounter (Signed)
Called daughter Benjamine Mola and left message for her to call back if there is an urgent need for conversation but otherwise I will call her back on Friday when I am back in the office.

## 2022-01-04 NOTE — Telephone Encounter (Signed)
Pt daughter Benjamine Mola called requesting a call back stated she has question and concerns about pt last appointment with PCP. Daughter is not on Pt current DPR .Please Advise # [phone]

## 2022-01-09 NOTE — Telephone Encounter (Signed)
Called 01/07/2022 no answer. LMOM. Will call again next week.

## 2022-01-09 NOTE — Assessment & Plan Note (Signed)
Encouraged exercise, weight loss, healthy eating habits. ? ?

## 2022-01-09 NOTE — Assessment & Plan Note (Addendum)
New, otherwise normal cognition and memory.  Normal Mini-Mental status exam.  Recent labs within normal range.  Patient has no known history o delusional thoughts or behavior.  She denies depression or anxiety.  I will contact her family to gather more information on current issues.  She is on several medications including gabapentin, nortriptyline, Cymbalta, tramadol that could contribute to behavior and mental status changes, but she is not sedated and has very specific delusional thoughts regarding [name].  I will contact neurology to make sure there have been no changes in dosing.

## 2022-01-09 NOTE — Assessment & Plan Note (Signed)
  LDL at goal but she is at increased risk for cardiovascular disease.  We can consider further treatment of this once other acute issues have been evaluated.

## 2022-01-09 NOTE — Assessment & Plan Note (Signed)
Chronic, stable control 

## 2022-01-12 NOTE — Telephone Encounter (Signed)
Patient daughter called and was returning Dr. Diona Browner phone call. Call back number [phone].

## 2022-01-14 ENCOUNTER — Telehealth: Payer: Self-pay | Admitting: Family Medicine

## 2022-01-14 NOTE — Telephone Encounter (Signed)
Pt sister Jonell Cluck called in requesting to speak with PCP have question and concern about pt . Please Advise # [phone]

## 2022-01-14 NOTE — Assessment & Plan Note (Signed)
Unclear etiology.  No clear neurologic cause although she is on multiple sedating meds.  Limit tramadol as able. I will message neurology to determine if we can work on decreasing some of her sedating medications. Lab eval normal.  Most like psychiatric issue.  Pt continues to have delusion thoughts. Denies depression. She is agreeable to referral to psychiatry.

## 2022-01-14 NOTE — Telephone Encounter (Signed)
Can you check and make sure nothing else needs to be done with the psychiatry referral as the patient is continuing to have problems.  Of note I did give all those phone numbers listed on the referral to her family for them to call to see if they could make an appointment.  If this is all that needs to be done?  The best contacts are Reino Bellis, sister and Benjamine Mola, daughter both have DPR.
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
## 2022-01-17 NOTE — Telephone Encounter (Signed)
error 

## 2022-01-21 NOTE — Progress Notes (Signed)
Call patient as well as her daughter, Benjamine Mola.  Let her know I have contacted Dr. Delice Lesch her neurologist.  We discussed possible side effects to her high dose use of gabapentin.  Dr. Delice Lesch is agreeable to have [place] try to wean down on her gabapentin dose. If she willing to try this?  If so, have her decrease gabapentin down to 1 tab of 600 mg in AM, 2 in mid day and 2 at bedtime... if tolerating decrease down further after 1 week to 1 in am, 1 mid day and 2 in PM.   Also, when I last spoke with her daughter there was some question of patient not being able to buy her meds... is she able to do so? Has she been talking the gabapentin at all?   Does she have the appt with psychiatry scheduled yet?

## 2022-01-24 ENCOUNTER — Telehealth: Payer: Self-pay | Admitting: *Deleted
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
## 2022-01-24 NOTE — Telephone Encounter (Signed)
-----   Message from [name], MD sent at 01/21/2022  6:07 PM EDT -----    ----- Message ----- From: Cameron Sprang, MD Sent: 01/19/2022   8:27 AM EDT To: [name], MD  Hi Amy,  Sorry I was out of the office last week. The last time I saw her, pain overall okay with addition of Cymbalta. I think it is reasonable to reduce her Gabapentin dose to see if this is contributing.  Take care, Santiago Glad  ----- Message ----- From: [name], MD Sent: 01/14/2022   5:43 PM EDT To: Cameron Sprang, MD  Dr. Delice Lesch, this shared patient of ours is having new issues with delusional thoughts.  Please see the recent office visit notes.  I believe most of this is psychiatric and have a psychiatric referral in place, but I would did want to ask if you thought any of her sedating medications could be contributing to this issue.  Would you consider reducing her gabapentin dose?

## 2022-01-24 NOTE — Telephone Encounter (Signed)
What do we need to do to get this patient into a psychiatrist?  Per the referral note, the initial  referral location feels she needs higher level care.  Can you help contact the family and give them locations to call? Her daughter Benjamine Mola is the best contact person, not the patient. Thanks!

## 2022-01-27 ENCOUNTER — Other Ambulatory Visit: Payer: Self-pay | Admitting: Urology

## 2022-02-03 ENCOUNTER — Encounter: Payer: Self-pay | Admitting: Family Medicine

## 2022-02-03 ENCOUNTER — Encounter: Payer: Self-pay | Admitting: *Deleted

## 2022-02-03 ENCOUNTER — Ambulatory Visit (INDEPENDENT_AMBULATORY_CARE_PROVIDER_SITE_OTHER): Payer: Medicare Other | Admitting: Family Medicine

## 2022-02-03 VITALS — BP 138/88 | HR 103 | Temp 98.3°F | Ht <= 58 in | Wt 127.0 lb

## 2022-02-03 DIAGNOSIS — Z23 Encounter for immunization: Secondary | ICD-10-CM

## 2022-02-03 DIAGNOSIS — M546 Pain in thoracic spine: Secondary | ICD-10-CM | POA: Diagnosis not present

## 2022-02-03 DIAGNOSIS — R221 Localized swelling, mass and lump, neck: Secondary | ICD-10-CM | POA: Diagnosis not present

## 2022-02-03 HISTORY — DX: Localized swelling, mass and lump, neck: R22.1

## 2022-02-03 HISTORY — DX: Pain in thoracic spine: M54.6

## 2022-02-03 NOTE — Assessment & Plan Note (Signed)
Continued, intermittent.  We discussed weaning down gabapentin if able and she is willing to try.

## 2022-02-03 NOTE — Progress Notes (Signed)
Patient ID: Katie Zimmerman, female    DOB: 03-Dec-1945, 76 y.o.   MRN: [id number]  This visit was conducted in person.  BP (!) 148/90   Pulse (!) 103   Temp 98.3 F (36.8 C) (Oral)   Ht 4' 9.75" (1.467 m)   Wt 127 lb (57.6 kg)   SpO2 98%   BMI 26.77 kg/m    CC:  Chief Complaint  Patient presents with   Mass    On Left side of neck for 2 weeks. Increased in size since first noticed. Patient notices it pulsating.     Subjective:   HPI: Katie Zimmerman is a 76 y.o. female with recent mental status changes and delusions presenting on 02/03/2022 for Mass (On Left side of neck for 2 weeks. Increased in size since first noticed. Patient notices it pulsating. )  She presents in office today with her sister.  She reports noting 2 weeks ago a mass on the left side of her neck, pea size.  She has noticed an increase in size of the lesion, lima bean size now.  She feels that she notices it pulsating.   No heat, no pain.  No ST, no congestion.  Some headaches.  No swallowing issues.    No dx of HTN BP Readings from Last 3 Encounters:  02/03/22 (!) 148/90  12/31/21 130/84  12/02/21 130/74   Using tylenol off and on for pain in upper back, occ catching. No numbness , no weakness.    She would like to receive shingrix vaccine today.   She has not decreased her gabapentin dose yet.  She states she has no issues getting medications, no cost issues except for trelegy.  Sister remains concerned she may not be taking her medications.  Relevant past medical, surgical, family and social history reviewed and updated as indicated. Interim medical history since our last visit reviewed. Allergies and medications reviewed and updated. Outpatient Medications Prior to Visit  Medication Sig Dispense Refill   acetaminophen (TYLENOL) 500 MG tablet Take 1,000 mg by mouth every 4 (four) hours as needed for mild pain, moderate pain or headache.      albuterol (VENTOLIN HFA) 108 (90 Base) MCG/ACT inhaler  INHALE 2 PUFFS BY MOUTH EVERY 6 HOURS AS NEEDED FOR WHEEZING FOR SHORTNESS OF BREATH 9 g 5   Cholecalciferol (VITAMIN D) 50 MCG (2000 UT) tablet Take 2,000 Units by mouth daily.     diclofenac Sodium (VOLTAREN) 1 % GEL Apply 1 application topically 2 (two) times daily as needed (pain).     DULoxetine (CYMBALTA) 30 MG capsule Take 1 tablet every night 90 capsule 3   gabapentin (NEURONTIN) 600 MG tablet Take 2 tablets three times a day 540 tablet 3   ipratropium-albuterol (DUONEB) 0.5-2.5 (3) MG/3ML SOLN Take 3 mLs by nebulization every 6 (six) hours as needed. 150 mL 12   Multiple Vitamins-Minerals (GNP HAIR/SKIN/NAILS PO) Take 3 tablets by mouth daily.     nortriptyline (PAMELOR) 50 MG capsule Take 3 capsules every night 270 capsule 3   Potassium Citrate 15 MEQ (1620 MG) TBCR Take 2 tablets by mouth in the morning and at bedtime. 120 tablet 11   traMADol (ULTRAM) 50 MG tablet TAKE 1/2 TO 1 (ONE-HALF TO ONE) TABLET BY MOUTH ONCE DAILY AS NEEDED FOR SEVERE PAIN 30 tablet 5   vitamin B-12 (CYANOCOBALAMIN) 1000 MCG tablet Take 1,000 mcg by mouth daily.     benzonatate (TESSALON) 200 MG capsule Take 1 capsule (  200 mg total) by mouth 3 (three) times daily as needed for cough. (Patient not taking: Reported on 02/03/2022) 20 capsule 0   Budeson-Glycopyrrol-Formoterol (BREZTRI AEROSPHERE) 160-9-4.8 MCG/ACT AERO Inhale 2 puffs into the lungs in the morning and at bedtime. 10.7 g 0   ondansetron (ZOFRAN) 4 MG tablet Take 4 mg by mouth every 8 (eight) hours as needed. (Patient not taking: Reported on 02/03/2022)     SF 5000 PLUS 1.1 % CREA dental cream Place 1 application onto teeth at bedtime. (Patient not taking: Reported on 02/03/2022)     No facility-administered medications prior to visit.     Per HPI unless specifically indicated in ROS section below Review of Systems  Constitutional:  Negative for fatigue and fever.  HENT:  Negative for congestion.   Eyes:  Negative for pain.  Respiratory:  Negative for  cough and shortness of breath.   Cardiovascular:  Negative for chest pain, palpitations and leg swelling.  Gastrointestinal:  Negative for abdominal pain.  Genitourinary:  Negative for dysuria and vaginal bleeding.  Musculoskeletal:  Positive for back pain.  Neurological:  Negative for syncope, light-headedness and headaches.  Psychiatric/Behavioral:  Negative for dysphoric mood.    Objective:  BP (!) 148/90   Pulse (!) 103   Temp 98.3 F (36.8 C) (Oral)   Ht 4' 9.75" (1.467 m)   Wt 127 lb (57.6 kg)   SpO2 98%   BMI 26.77 kg/m   Wt Readings from Last 3 Encounters:  02/03/22 127 lb (57.6 kg)  12/31/21 136 lb 8 oz (61.9 kg)  12/02/21 141 lb 4 oz (64.1 kg)      Physical Exam Constitutional:      General: She is not in acute distress.    Appearance: Normal appearance. She is well-developed. She is not ill-appearing or toxic-appearing.  HENT:     Head: Normocephalic.     Right Ear: Hearing, tympanic membrane, ear canal and external ear normal. Tympanic membrane is not erythematous, retracted or bulging.     Left Ear: Hearing, tympanic membrane, ear canal and external ear normal. Tympanic membrane is not erythematous, retracted or bulging.     Nose: No mucosal edema or rhinorrhea.     Right Sinus: No maxillary sinus tenderness or frontal sinus tenderness.     Left Sinus: No maxillary sinus tenderness or frontal sinus tenderness.     Mouth/Throat:     Pharynx: Uvula midline.  Eyes:     General: Lids are normal. Lids are everted, no foreign bodies appreciated.     Conjunctiva/sclera: Conjunctivae normal.     Pupils: Pupils are equal, round, and reactive to light.  Neck:     Thyroid: No thyroid mass or thyromegaly.     Vascular: No carotid bruit.     Trachea: Trachea normal.  Cardiovascular:     Rate and Rhythm: Normal rate and regular rhythm.     Pulses: Normal pulses.     Heart sounds: Normal heart sounds, S1 normal and S2 normal. No murmur heard.    No friction rub. No  gallop.  Pulmonary:     Effort: Pulmonary effort is normal. No tachypnea or respiratory distress.     Breath sounds: Normal breath sounds. No decreased breath sounds, wheezing, rhonchi or rales.  Abdominal:     General: Bowel sounds are normal.     Palpations: Abdomen is soft.     Tenderness: There is no abdominal tenderness.  Musculoskeletal:     Cervical back: Normal, normal range  of motion and neck supple.     Thoracic back: Tenderness present. No bony tenderness. Normal range of motion.     Lumbar back: Normal.  Skin:    General: Skin is warm and dry.     Findings: No rash.  Neurological:     Mental Status: She is alert.  Psychiatric:        Mood and Affect: Mood is not anxious or depressed.        Speech: Speech normal.        Behavior: Behavior normal. Behavior is cooperative.        Thought Content: Thought content normal.        Judgment: Judgment normal.       Results for orders placed or performed in visit on 12/08/21  Vitamin B12  Result Value Ref Range   Vitamin B-12 1,499 (H) 211 - 911 pg/mL  T3, free  Result Value Ref Range   T3, Free 2.7 2.3 - 4.2 pg/mL  TSH  Result Value Ref Range   TSH 2.52 0.35 - 5.50 uIU/mL  T4, free  Result Value Ref Range   Free T4 1.10 0.60 - 1.60 ng/dL  CBC with Differential/Platelet  Result Value Ref Range   WBC 6.3 4.0 - 10.5 K/uL   RBC 4.52 3.87 - 5.11 Mil/uL   Hemoglobin 13.7 12.0 - 15.0 g/dL   HCT 41.4 36.0 - 46.0 %   MCV 91.6 78.0 - 100.0 fl   MCHC 33.1 30.0 - 36.0 g/dL   RDW 13.8 11.5 - 15.5 %   Platelets 302.0 150.0 - 400.0 K/uL   Neutrophils Relative % 58.5 43.0 - 77.0 %   Lymphocytes Relative 29.2 12.0 - 46.0 %   Monocytes Relative 8.1 3.0 - 12.0 %   Eosinophils Relative 3.4 0.0 - 5.0 %   Basophils Relative 0.8 0.0 - 3.0 %   Neutro Abs 3.7 1.4 - 7.7 K/uL   Lymphs Abs 1.8 0.7 - 4.0 K/uL   Monocytes Absolute 0.5 0.1 - 1.0 K/uL   Eosinophils Absolute 0.2 0.0 - 0.7 K/uL   Basophils Absolute 0.1 0.0 - 0.1 K/uL   VITAMIN D 25 Hydroxy (Vit-D Deficiency, Fractures)  Result Value Ref Range   VITD 49.29 30.00 - 100.00 ng/mL  Ammonia  Result Value Ref Range   Ammonia 20 11 - 35 umol/L  Urine drug profile, 9 drugs (performed at [facility])  Result Value Ref Range   Amphetamines, Urine Negative Cutoff=1000 ng/mL   Barbiturate Quant, Ur Negative Cutoff=300 ng/mL   Benzodiazepine Quant, Ur Negative Cutoff=300 ng/mL   Cannabinoid Quant, Ur Negative Cutoff=50 ng/mL   Cocaine (Metab.) Negative Cutoff=300 ng/mL   Opiate Quant, Ur Negative Cutoff=300 ng/mL   PCP Quant, Ur Negative Cutoff=25 ng/mL   Methadone Screen, Urine Negative Cutoff=300 ng/mL   Propoxyphene Negative Cutoff=300 ng/mL     COVID 19 screen:  No recent travel or known exposure to COVID19 The patient denies respiratory symptoms of COVID 19 at this time. The importance of social distancing was discussed today.   Assessment and Plan    Problem List Items Addressed This Visit     Acute left-sided thoracic back pain    Acute  Most likely musculoskeletal strain Treat with Tylenol 650 mg p.o. 3 times daily as needed, heat, massage and upper back stretches.      Mass of left side of neck - Primary    Acute Not clearly lymphadenopathy or cyst, pulsatile mass overlying sternocleidomastoid muscle on the left.  Not overlying the carotid.  Will evaluate with ultrasound of your neck, may additionally need vascular imaging      Relevant Orders   US Soft Tissue Head/Neck (NON-THYROID)   Other Visit Diagnoses     Need for shingles vaccine       Relevant Orders   Varicella-zoster vaccine subcutaneous        Eliezer Lofts, MD

## 2022-02-03 NOTE — Assessment & Plan Note (Signed)
Acute Not clearly lymphadenopathy or cyst, pulsatile mass overlying sternocleidomastoid muscle on the left.  Not overlying the carotid.  Will evaluate with ultrasound of your neck, may additionally need vascular imaging

## 2022-02-03 NOTE — Assessment & Plan Note (Signed)
Acute  Most likely musculoskeletal strain Treat with Tylenol 650 mg p.o. 3 times daily as needed, heat, massage and upper back stretches.

## 2022-02-03 NOTE — Patient Instructions (Addendum)
Please decrease gabapentin to 1 and 1/2 tablet of 600 mg gabapentin Three times daily.  Stretch upper back and neck, heat, massage and tylenol 650 mg 2 tablets three timres daily as needed for thoracic pain.  We will work on setting up  an [place] of left neck mass.

## 2022-02-03 NOTE — Addendum Note (Signed)
Addended by: Pat Kocher on: 02/03/2022 02:07 PM   Modules accepted: Orders

## 2022-02-07 ENCOUNTER — Other Ambulatory Visit: Payer: Self-pay

## 2022-02-07 ENCOUNTER — Telehealth: Payer: Self-pay | Admitting: Family Medicine
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
## 2022-02-07 NOTE — Telephone Encounter (Signed)
Katie Zimmerman pts sister (DPR signed) said that she still has not heard about psychiatry referral and Katie Zimmerman is not sure if pt is taking her medications. Katie Zimmerman also wants to know if appt has been made for the [place] for mass in neck. I spoke with [name] [name] and transferred Katie Zimmerman to [name] and she is talking with Katie Zimmerman now. [name] said psychiatry had been closed and she would try [facility].  Katie Zimmerman also said that she had spoken with DSS today and was to let Butch Penny or Dr Diona Browner know what happened yesterday. Katie Zimmerman did not want to give info to me and said DSS wanted Katie Zimmerman to speak with Dr Diona Browner or Butch Penny.  Sending note to Dr Diona Browner and Butch Penny. Katie Zimmerman contact # is [phone].Katie Zimmerman does not do my chart.

## 2022-02-08 NOTE — Telephone Encounter (Signed)
The referral was sent to [facility]. Katie Zimmerman was calling today to schedule an appt and was going to let us know if unable to schedule. Given the nature of the referral, I am hands off once the referral is sent, they will not schedule any appts with me, only directly with the patient.

## 2022-02-08 NOTE — Telephone Encounter (Signed)
See other Telephone encounters. Referral was sent to [facility] 02/07/2022

## 2022-02-09 NOTE — Telephone Encounter (Signed)
Spoke  with Katie Zimmerman stated she called  [facility] and was told she needs pt insurance info before scheduling . Please advise #[phone]

## 2022-02-10 NOTE — Telephone Encounter (Signed)
See referral in Epic.  Joycie Peek stated she was calling to schedule the appt with [facility]. Would let us know if any issues getting patient scheduled. She said the only issue would be if the patient continues to decline the appt - shes states that she has been refusing to to go talk to anyone.   Virl Cagey, [place]  02/08/2303:08:54 PM Spoke with patient Sister, Reino Bellis, aware to call [facility] in a few days to follow up on referral. Pt referral was denied by [employer].

## 2022-02-16 ENCOUNTER — Other Ambulatory Visit: Payer: Self-pay | Admitting: Urology

## 2022-02-17 ENCOUNTER — Telehealth: Payer: Self-pay

## 2022-02-17 NOTE — Chronic Care Management (AMB) (Signed)
    Chronic Care Management Pharmacy Assistant   Name: EKATERINI CAPITANO  MRN: [id number] DOB: March 17, 1946  Reason for Encounter:COPD Disease State  Attempted contact with [name] 3 times on 02/17/22,02/18/22,02/21/22. Unsuccessful outreach. Will attempt contact next month.      Recent office visits:  02/03/22-Amy Bedsole,MD(PCP) mass neck,Will evaluate with ultrasound of your neck,shingles vaccine given,c/o LBP,Treat with Tylenol 650 mg p.o. 3 times daily as needed, heat, massage and upper back stretches.Please decrease gabapentin to 1 and 1/2 tablet of 600 mg gabapentin Three times daily. 12/31/21-Amy Bedsole,MD(PCP)- f/u delusion,decrease gabapentin down to 1 tab of 600 mg in AM, 2 in mid day and 2 at bedtime... if tolerating decrease down further after 1 week to 1 in am, 1 mid day and 2 in PM.Unclear etiology.I will message neurology to determine if we can work on decreasing some of her sedating medications. Referral for psychiatry. 12/02/21-Amy Bedsole,MD(PCP)-f/u HLD,no medication changes f/u 6 months  Recent consult visits:  11/16/21-Clinton Young,MD(pulmo)-f/u bronchitis,Plan-Trelegy was too expensive.  Try changing it to [facility] sample and prescription with discussion  Hospital visits:  None in previous 6 months  Medications: Outpatient Encounter Medications as of 02/17/2022  Medication Sig   acetaminophen (TYLENOL) 500 MG tablet Take 1,000 mg by mouth every 4 (four) hours as needed for mild pain, moderate pain or headache.    albuterol (VENTOLIN HFA) 108 (90 Base) MCG/ACT inhaler INHALE 2 PUFFS BY MOUTH EVERY 6 HOURS AS NEEDED FOR WHEEZING FOR SHORTNESS OF BREATH   benzonatate (TESSALON) 200 MG capsule Take 1 capsule (200 mg total) by mouth 3 (three) times daily as needed for cough. (Patient not taking: Reported on 02/03/2022)   Budeson-Glycopyrrol-Formoterol (BREZTRI AEROSPHERE) 160-9-4.8 MCG/ACT AERO Inhale 2 puffs into the lungs in the morning and at bedtime.   Cholecalciferol (VITAMIN D) 50  MCG (2000 UT) tablet Take 2,000 Units by mouth daily.   diclofenac Sodium (VOLTAREN) 1 % GEL Apply 1 application topically 2 (two) times daily as needed (pain).   DULoxetine (CYMBALTA) 30 MG capsule Take 1 tablet every night   gabapentin (NEURONTIN) 600 MG tablet Take 2 tablets three times a day   ipratropium-albuterol (DUONEB) 0.5-2.5 (3) MG/3ML SOLN Take 3 mLs by nebulization every 6 (six) hours as needed.   Multiple Vitamins-Minerals (GNP HAIR/SKIN/NAILS PO) Take 3 tablets by mouth daily.   nortriptyline (PAMELOR) 50 MG capsule Take 3 capsules every night   ondansetron (ZOFRAN) 4 MG tablet Take 4 mg by mouth every 8 (eight) hours as needed. (Patient not taking: Reported on 02/03/2022)   Potassium Citrate 15 MEQ (1620 MG) TBCR TAKE 2 TABLETS BY MOUTH IN THE MORNING AND AT BEDTIME   SF 5000 PLUS 1.1 % CREA dental cream Place 1 application onto teeth at bedtime. (Patient not taking: Reported on 02/03/2022)   traMADol (ULTRAM) 50 MG tablet TAKE 1/2 TO 1 (ONE-HALF TO ONE) TABLET BY MOUTH ONCE DAILY AS NEEDED FOR SEVERE PAIN   vitamin B-12 (CYANOCOBALAMIN) 1000 MCG tablet Take 1,000 mcg by mouth daily.   No facility-administered encounter medications on file as of 02/17/2022.    Contacted patient to discuss COPD disease state  Unsuccessful outreach  Charlene Brooke, CPP notified  [name], [place]  [phone]

## 2022-02-18 ENCOUNTER — Telehealth: Payer: Self-pay | Admitting: Family Medicine

## 2022-02-18 NOTE — Telephone Encounter (Signed)
Please contact sister or daughter...  I want her to see psychiatry for  diagnosis assistance  and medication.. that is appropriate and what we need... not counseling in this case.   Have her make appt there please.

## 2022-02-18 NOTE — Telephone Encounter (Signed)
Noted.   There is no specific testing for alzheimer's.  Katie Zimmerman has appt with her Neurologist in early November.. family should go with her to appt to help discuss concerns. I have notified Neuro about her issues ongoing.  Move ahead as previously recommended with psychiatry evaluation.

## 2022-02-18 NOTE — Telephone Encounter (Signed)
Sister called in regarding the psychiatry office that she was referred to,and they told her that they only do medication management ,and the counselors there do not take her medicare.She would like to speak with Butch Penny as to other options that may be available for her sister,because things are getting worse.
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
## 2022-02-21 NOTE — Telephone Encounter (Signed)
Harriett notified as instructed by telephone.

## 2022-02-24 ENCOUNTER — Telehealth: Payer: Self-pay | Admitting: Neurology

## 2022-02-24 NOTE — Telephone Encounter (Signed)
Pt's sister who is on the newest DPR called in wanting to move the pt's appointment up. The pt is having some issues with memory. They are concerned due to the patient being scammed online. She thinks she is talking to [employer] and giving him money. She has been added to the wait list.

## 2022-02-24 NOTE — Telephone Encounter (Signed)
This is a new issue, not what I was seeing her for. Ok to put on NP slot on Oct 6 at 1pm if they can both come then. Needs [facility]. Thanks

## 2022-02-28 NOTE — Telephone Encounter (Signed)
Spoke with Katie Zimmerman, She states that she handled this herself since no one called her back. I apologized for the delay and advised that I was out of office for two weeks. She states that she has spoken with Butch Penny, CMA since she called 02/09/2022 and she states that this has been straightened out since we last spoke. See 02/18/2022 telephone encounter. She states that [facility] was not seeing the referral (they were looking for a faxed referral and not the internal Epic referral). That's have since found the referral and insurance information and nothing further is needed at this time related to that referral.   Katie Zimmerman states that the patient is scheduled this week with Dr Delice Lesch with Neurology for Alzheimer/Mental Status Eval.   Her visit is on hold with [facility] for now until the Neurology visit.   She will let us know if she decides to pursue the Referral to [facility].   Nothing further needed.

## 2022-03-01 ENCOUNTER — Ambulatory Visit
Admission: RE | Admit: 2022-03-01 | Discharge: 2022-03-01 | Disposition: A | Discharge status: Home / Self Care | Payer: Medicare Other | Source: Ambulatory Visit | Attending: Family Medicine | Admitting: Family Medicine

## 2022-03-01 ENCOUNTER — Telehealth: Payer: Self-pay

## 2022-03-01 DIAGNOSIS — R221 Localized swelling, mass and lump, neck: Secondary | ICD-10-CM | POA: Diagnosis not present

## 2022-03-01 NOTE — Chronic Care Management (AMB) (Signed)
Chronic Care Management Pharmacy Assistant   Name: Katie Zimmerman  MRN: [id number] DOB: 07/07/1945  Reason for Encounter:COPD Disease State  Attempted contact with [name] 3 times on 03/01/22,03/04/22,03/08/22. Unsuccessful outreach. Will attempt contact next month.    Recent office visits:  02/03/22-Amy Bedsole,MD(PCP) mass neck,Will evaluate with ultrasound of your neck,shingles vaccine given,c/o LBP,Treat with Tylenol 650 mg p.o. 3 times daily as needed, heat, massage and upper back stretches.Please decrease gabapentin to 1 and 1/2 tablet of 600 mg gabapentin Three times daily. 12/31/21-Amy Bedsole,MD(PCP)- f/u delusion,decrease gabapentin down to 1 tab of 600 mg in AM, 2 in mid day and 2 at bedtime... if tolerating decrease down further after 1 week to 1 in am, 1 mid day and 2 in PM.Unclear etiology.I will message neurology to determine if we can work on decreasing some of her sedating medications. Referral for psychiatry. 12/02/21-Amy Bedsole,MD(PCP)-f/u HLD,no medication changes f/u 6 months  Recent consult visits:  11/16/21-Clinton Young,MD(pulmo)-f/u bronchitis,Plan-Trelegy was too expensive.  Try changing it to [facility] sample and prescription with discussion    Hospital visits:  None in previous 6 months  Medications: Outpatient Encounter Medications as of 03/01/2022  Medication Sig   acetaminophen (TYLENOL) 500 MG tablet Take 1,000 mg by mouth every 4 (four) hours as needed for mild pain, moderate pain or headache.    albuterol (VENTOLIN HFA) 108 (90 Base) MCG/ACT inhaler INHALE 2 PUFFS BY MOUTH EVERY 6 HOURS AS NEEDED FOR WHEEZING FOR SHORTNESS OF BREATH   benzonatate (TESSALON) 200 MG capsule Take 1 capsule (200 mg total) by mouth 3 (three) times daily as needed for cough. (Patient not taking: Reported on 02/03/2022)   Budeson-Glycopyrrol-Formoterol (BREZTRI AEROSPHERE) 160-9-4.8 MCG/ACT AERO Inhale 2 puffs into the lungs in the morning and at bedtime.   Cholecalciferol (VITAMIN D) 50  MCG (2000 UT) tablet Take 2,000 Units by mouth daily.   diclofenac Sodium (VOLTAREN) 1 % GEL Apply 1 application topically 2 (two) times daily as needed (pain).   DULoxetine (CYMBALTA) 30 MG capsule Take 1 tablet every night   gabapentin (NEURONTIN) 600 MG tablet Take 2 tablets three times a day   ipratropium-albuterol (DUONEB) 0.5-2.5 (3) MG/3ML SOLN Take 3 mLs by nebulization every 6 (six) hours as needed.   Multiple Vitamins-Minerals (GNP HAIR/SKIN/NAILS PO) Take 3 tablets by mouth daily.   nortriptyline (PAMELOR) 50 MG capsule Take 3 capsules every night   ondansetron (ZOFRAN) 4 MG tablet Take 4 mg by mouth every 8 (eight) hours as needed. (Patient not taking: Reported on 02/03/2022)   Potassium Citrate 15 MEQ (1620 MG) TBCR TAKE 2 TABLETS BY MOUTH IN THE MORNING AND AT BEDTIME   SF 5000 PLUS 1.1 % CREA dental cream Place 1 application onto teeth at bedtime. (Patient not taking: Reported on 02/03/2022)   traMADol (ULTRAM) 50 MG tablet TAKE 1/2 TO 1 (ONE-HALF TO ONE) TABLET BY MOUTH ONCE DAILY AS NEEDED FOR SEVERE PAIN   vitamin B-12 (CYANOCOBALAMIN) 1000 MCG tablet Take 1,000 mcg by mouth daily.   No facility-administered encounter medications on file as of 03/01/2022.    Contacted patient to discuss COPD disease state  Current COPD regimen:  Rescue:  Albuterol HFA 108 inhaler - 2 puffs every 6 hours Maintenance: Trelegy 100-62.5-25 mcg - 1 puff daily                           Duoneb nebulizer as needed  Any recent hospitalizations or ED visits  since last visit with CPP? No  Current tobacco use? none   Respiratory Devices/Equipment Do you have a nebulizer? Yes Do you use a Peak Flow Meter? No Do you use a maintenance inhaler? Yes How often do you forget to use your daily inhaler? never Do you use a rescue inhaler? Yes How often do you use your rescue inhaler?  several times per month Do you use a spacer with your inhaler? No  Adherence Review: Does the patient have >5 day gap  between last estimated fill date for maintenance inhaler medications? No  Albuterol 108  01/27/22   25ds  Trelegy 165mg    01/08/22   90ds  PCP appointment on 04/01/22 AWV telephone  and CCM appointment on 06/06/22   Urology 04/07/22  Care Gaps: Annual wellness visit in last year? Yes Most Recent BP reading:130/84  12/31/21   Summary of recommendations from last CDixonvisit (Date:06/08/21- M.Adams,CPP) Summary:  CCM follow up visit. AWV with PCP last month, labs updated. Chronic conditions stable. No barriers to adherence identified. She has monitored blood pressure at home (130s/80). She has follow up scheduled with pulmonology (Feb) and neurology (April). We will hold off on Trelegy assistance until patient spends the required out of pocket expense for 2023.    Recommendations/Plan: CCM follow up 9 months  Star Rated Drugs Medication Name  Last Fill Date  Day supply No star medications identified  LCharlene Brooke CPP notified  [facility] [phone]

## 2022-03-04 ENCOUNTER — Ambulatory Visit: Payer: Medicare Other | Admitting: Neurology

## 2022-03-16 ENCOUNTER — Ambulatory Visit (INDEPENDENT_AMBULATORY_CARE_PROVIDER_SITE_OTHER): Payer: Medicare Other | Admitting: Neurology

## 2022-03-16 ENCOUNTER — Other Ambulatory Visit (INDEPENDENT_AMBULATORY_CARE_PROVIDER_SITE_OTHER): Payer: Medicare Other

## 2022-03-16 ENCOUNTER — Encounter: Payer: Self-pay | Admitting: Neurology

## 2022-03-16 VITALS — BP 164/97 | HR 98 | Ht 58.5 in | Wt 126.2 lb

## 2022-03-16 DIAGNOSIS — R4189 Other symptoms and signs involving cognitive functions and awareness: Secondary | ICD-10-CM

## 2022-03-16 DIAGNOSIS — B0229 Other postherpetic nervous system involvement: Secondary | ICD-10-CM

## 2022-03-16 DIAGNOSIS — R4689 Other symptoms and signs involving appearance and behavior: Secondary | ICD-10-CM

## 2022-03-16 MED ORDER — GABAPENTIN 600 MG PO TABS: ORAL_TABLET | ORAL | 3 refills | Status: DC

## 2022-03-16 MED ORDER — NORTRIPTYLINE HCL 50 MG PO CAPS: ORAL_CAPSULE | ORAL | 3 refills | Status: DC

## 2022-03-16 MED ORDER — DULOXETINE HCL 30 MG PO CPEP: ORAL_CAPSULE | ORAL | 3 refills | Status: DC

## 2022-03-16 NOTE — Patient Instructions (Signed)
Good to see you.  Schedule MRI brain with and without contrast  2. Bloodwork for paraneoplastic panel  3. Start slowly reducing the Gabapentin '600mg'$ : take 1 tablet in AM, 2 tablets at noon, 2 tablets at bedtime for 1 week, then reduce to 1 tablet in AM, 1 tablet at noon, 2 tablets at bedtime for 1 week, then reduce to 1 tablet three times a day  4. Take the Duloxetine '30mg'$  daily on a regular basis, continue nortriptyline '150mg'$  every night  5. Follow-up after MRI, call for any changes

## 2022-03-16 NOTE — Progress Notes (Signed)
NEUROLOGY FOLLOW UP OFFICE NOTE  ZHANA JEANGILLES [id number] 05/02/1946  HISTORY OF PRESENT ILLNESS: I had the pleasure of seeing Raizel Wesolowski in follow-up in the neurology clinic on 03/16/2022.  The patient was last seen on 6 months ago for postherpetic neuralgia. She presents today with her sister Harriett due to a change in symptoms. She states the facial pain is bothering her now. She is taking Gabapentin '1200mg'$  TID, nortriptyline '150mg'$  qhs. She has Cymbalta but only takes it as needed, if the pain does not stop. She has not had Tramadol refills.   Main concern today is her fixed belief that [name] has been contacting her online and giving her money. Family became alerted about this around 6 months ago when she was unable to pay her niece to fix her hair but said she was going to get a lot of money because [name] was going to give her money. She states she has been on a chatline with him since the beginning of the year and this gentleman will be coming tomorrow to her house to meet her. She initially stated she has not seen him in person, then Reino Bellis reminds her of the time in July when she was missing overnight and no one knew where she was. She states her niece knew she was at [employer] was called and they did find her in the [employer] parking lot 3 miles away. She told them she spent the night with [name] at the [facility], but apparently the sheriff went to the hotel and there was no indication she or anyone with the name of [name] had checked in. She then stated she had seen him in person for a little bit. Reino Bellis is concerned that she has gone to stores and bought gift cards. She had told her sister that he was at the [facility] in [place] trying to get her car and she was buying these gift cards because [employer] would not release her car but would take gift cards. She states he gives her money to buy gift cards, and is adamant that he does not have access  to her bank account. She states he sends her his card number. They chat online at different times of the day. She has told her sister that his secretary sent her a message one time that he was in a meeting and he would be in touch. Reino Bellis reports she is very protective of her iPad and does not let anyone look at it. She apparently had issues with her phone payment, she said she paid the bill 2 months ago and it was reconnected, "everything works on my phone except calling in and out." There was another incident 2 weeks ago, she thought someone was outside trying to break in. Her niece said she was walking outside at 4am, she says she was only walking around and it was not 4am, but family reported she had bags packed waiting for him to pick her up. She then states she thought she saw someone standing at the fence, family took pictures from inside the house. She states "I was the only one seeing what I was seeing," her brother-in-law Liliane Channel went outside to check. There are apparently some issues with Liliane Channel who she shares a home with since 2014, in addition to her niece and niece's son. They report utilities are split in half, she gives [employer] and he pays online. She states "he hides the light bill, I  have not seen the [employer] bill." She got agitated at one point, raising her voice at [place] saying "Liliane Channel does not like me." She states her balance on her bank account is $5 because she does not leave money in her checking account, she draws everything that is deposited from her Social Security every month, goes to [place] supplies which can be $300. Harriett states family is concerned that she does not have much money, but she says "no one can get a hold of my money." Harriett reports several family issues, her husband and dog passed away soon after each other, then her pastor left.     History on Initial Assessment 01/15/2018: This is a pleasant 76 year old right-handed woman with a history of breast  cancer, presenting for postherpetic neuralgia. She had shingles affecting the right side of her face in 2000. She did well for a year or 2 with no symptoms then started having significant pain in the same distribution. She describes pain as pressure and tenderness when she washes or touches her cheek on the right side of her nose. It is painful to talk, with pain radiating from the right temporal region down the right side of her tongue. It is hard to eat and talk. Bending down seems to worsen pain. She does have a diagnosis of TMJ on the right as well. She was tried on different medications in the past few years and found gabapentin helped for a while, then stopped working. She tried Lyrica several years ago which also helped for a little while, then she saw a neurologist who increased to the dose to '200mg'$  but she never filled the higher dose because she developed kidney stones in 2011. Around that time, the pain was not bothering her so much, but when pain recurred, she was restarted on gabapentin which worked pretty good until recently. For the past few months, she has had a constant 10/10 pain (appears comfortable in the office today), when she has sharp pains, the pain would be more than a 10. Pain is worse at night, really bad around 9pm. She takes her gabapentin and can sleep. The last few days she has increased gabapentin '300mg'$  taking 3 in AM, 2 at supper, 3 at bedtime. No drowsiness. She denies any tinnitus but feels she has lost some hearing in the right ear. No vision changes. For a time she was having headaches with pressure over the vertex, but none lately. No nausea/vomiting, dizziness, diplopia, dysarthria/dysphagia, neck pain, focal numbness/tingling/weakness in the extremities, bowel/bladder dysfunction. Left side of face is unaffected. She has chronic back pain.     PAST MEDICAL HISTORY: Past Medical History:  Diagnosis Date   Breast cancer ([place]) 12/2011   Breast cancer ([place]) [id number]/[id number]    DCIS   Complication of anesthesia    hard to wake up age 26-not since   COPD (chronic obstructive pulmonary disease) ([place])    Fibromyalgia    GERD (gastroesophageal reflux disease)    History of kidney stones    History of radiation therapy 02/29/12 -04/21/12   right breast, 5040 cGy in 28 fx, boost to total 6240 cGy   Kidney stone    Osteoporosis    Personal history of colonic polyps-adenomas 05/25/2012   Personal history of radiation therapy    PHN (postherpetic neuralgia) 2000   Right side of face   Pneumonia 12/19/2018    MEDICATIONS: Current Outpatient Medications on File Prior to Visit  Medication Sig Dispense Refill  acetaminophen (TYLENOL) 500 MG tablet Take 1,000 mg by mouth every 4 (four) hours as needed for mild pain, moderate pain or headache.      albuterol (VENTOLIN HFA) 108 (90 Base) MCG/ACT inhaler INHALE 2 PUFFS BY MOUTH EVERY 6 HOURS AS NEEDED FOR WHEEZING FOR SHORTNESS OF BREATH 9 g 5   benzonatate (TESSALON) 200 MG capsule Take 1 capsule (200 mg total) by mouth 3 (three) times daily as needed for cough. 20 capsule 0   Cholecalciferol (VITAMIN D) 50 MCG (2000 UT) tablet Take 2,000 Units by mouth daily.     diclofenac Sodium (VOLTAREN) 1 % GEL Apply 1 application topically 2 (two) times daily as needed (pain).     DULoxetine (CYMBALTA) 30 MG capsule Take 1 tablet every night 90 capsule 3   gabapentin (NEURONTIN) 600 MG tablet Take 2 tablets three times a day 540 tablet 3   ipratropium-albuterol (DUONEB) 0.5-2.5 (3) MG/3ML SOLN Take 3 mLs by nebulization every 6 (six) hours as needed. 150 mL 12   Multiple Vitamins-Minerals (GNP HAIR/SKIN/NAILS PO) Take 3 tablets by mouth daily.     nortriptyline (PAMELOR) 50 MG capsule Take 3 capsules every night 270 capsule 3   ondansetron (ZOFRAN) 4 MG tablet Take 4 mg by mouth every 8 (eight) hours as needed.     Potassium Citrate 15 MEQ (1620 MG) TBCR TAKE 2 TABLETS BY MOUTH IN THE MORNING AND AT BEDTIME 120 tablet 0   SF 5000  PLUS 1.1 % CREA dental cream Place 1 application  onto teeth at bedtime.     traMADol (ULTRAM) 50 MG tablet TAKE 1/2 TO 1 (ONE-HALF TO ONE) TABLET BY MOUTH ONCE DAILY AS NEEDED FOR SEVERE PAIN 30 tablet 5   TRELEGY ELLIPTA 100-62.5-25 MCG/ACT AEPB Inhale 1 puff into the lungs daily.     vitamin B-12 (CYANOCOBALAMIN) 1000 MCG tablet Take 1,000 mcg by mouth daily.     No current facility-administered medications on file prior to visit.    ALLERGIES: Allergies  Allergen Reactions   Penicillins Anaphylaxis, Rash and Other (See Comments)    Has patient had a PCN reaction causing immediate rash, facial/tongue/throat swelling, SOB or lightheadedness with hypotension: Yes Has patient had a PCN reaction causing severe rash involving mucus membranes or skin necrosis: No Has patient had a PCN reaction that required hospitalization No Has patient had a PCN reaction occurring within the last 10 years: No If all of the above answers are "NO", then may proceed with Cephalosporin use.   Venlafaxine Nausea Only   Sulfonamide Derivatives Rash    FAMILY HISTORY: Family History  Problem Relation Age of Onset   Hypertension Mother    Cancer Mother        LIVER cancer   Hypertension Father    Diabetes Father    Heart failure Father    Breast cancer Sister 53       NOT hormone receptor breast cancer   Breast cancer Maternal Aunt 75   Breast cancer Cousin 66       maternal cousin   Cancer Cousin 38       ? lung cancer; maternal cousin   Colon cancer Neg Hx    Stomach cancer Neg Hx     SOCIAL HISTORY: Social History   Socioeconomic History   Marital status: Widowed    Spouse name: Not on file   Number of children: 2   Years of education: Not on file   Highest education level: Not on file  Occupational History   Occupation: CONSULTANT    Employer: [employer]    Comment: Retired  Tobacco Use   Smoking status: Never    Passive exposure: Yes   Smokeless tobacco: Never   Tobacco comments:     Husband smoked  Vaping Use   Vaping Use: Never used  Substance and Sexual Activity   Alcohol use: Yes    Comment: occasional glass of wine   Drug use: No   Sexual activity: Not Currently  Other Topics Concern   Not on file  Social History Narrative   Reviewed 12/2013   Regular exercise-yes-intermittantly at [place]: fruits and veggies      Right handed      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      Lives  brother in law and his daughter and grandson  Husband passed away   Social Determinants of Health   Financial Resource Strain: [place]  (06/08/2021)   Overall Financial Resource Strain (CARDIA)    Difficulty of Paying Living Expenses: Not very hard  Food Insecurity: No Food Insecurity (03/30/2021)   Hunger Vital Sign    Worried About Running Out of Food in the Last Year: Never true    [place] in the Last Year: Never true  Transportation Needs: No Transportation Needs (03/30/2021)   PRAPARE - Hydrologist (Medical): No    Lack of Transportation (Non-Medical): No  Physical Activity: Insufficiently Active (03/30/2021)   Exercise Vital Sign    Days of Exercise per Week: 3 days    Minutes of Exercise per Session: 30 min  Stress: No Stress Concern Present (02/27/2019)   [place]    Feeling of Stress : Not at all  Social Connections: Moderately Integrated (03/30/2021)   Social Connection and Isolation Panel [NHANES]    Frequency of Communication with Friends and Family: Three times a week    Frequency of Social Gatherings with Friends and Family: Three times a week    Attends Religious Services: More than 4 times per year    Active Member of Clubs or Organizations: Yes    Attends Archivist Meetings: 1 to 4 times per year    Marital Status: Widowed  Intimate Partner Violence: Not At Risk (03/30/2021)   Humiliation, Afraid, Rape, and  Kick questionnaire    Fear of Current or Ex-Partner: No    Emotionally Abused: No    Physically Abused: No    Sexually Abused: No     PHYSICAL EXAM: Vitals:   03/16/22 1250  BP: (!) 160/102  Pulse: 98  SpO2: 98%   General: No acute distress Head:  Normocephalic/atraumatic Skin/Extremities: No rash, no edema Neurological Exam: alert and oriented to person, place, and time. No aphasia or dysarthria. Fund of knowledge is appropriate.  Recent and remote memory are intact.  Attention and concentration are normal. MoCA 28/30    03/16/2022    1:17 PM  Montreal Cognitive Assessment   Visuospatial/ Executive (0/5) 5  Naming (0/3) 2  Attention: Read list of digits (0/2) 2  Attention: Read list of letters (0/1) 1  Attention: Serial 7 subtraction starting at 100 (0/3) 3  Language: Repeat phrase (0/2) 2  Language : Fluency (0/1) 0  Abstraction (0/2) 2  Delayed Recall (0/5) 4  Orientation (0/6) 6  Total 27  Adjusted Score (based on education) 28   Cranial  nerves: Pupils equal, round. Extraocular movements intact with no nystagmus. Visual fields full.  No facial asymmetry.  Motor: Bulk and tone normal, no cogwheeling, muscle strength 5/5 throughout with no pronator drift.   Finger to nose testing intact.  Gait narrow-based and steady, able to tandem walk adequately.  Romberg negative. No tremor. +Postural instability.   IMPRESSION: This is a pleasant 76 yo RH woman with a history of breast cancer who I have been seeing since 2019 for right-sided post-herpetic neuralgia. She presents today with her sister with very concerning delusions and hallucinations. She is convinced [name] has been contacting her online, it appears she getting scammed with gift card scams but she is adamant no one is touching her money and [name] is giving her the money to pay for the gift cards. I am very concerned about these changes, etiology is unclear. There does not seem to be an underlying  neurodegenerative condition, MoCA score today 28/30, no parkinsonian signs to indicate Lewy Body disease. We discussed doing a brain MRI with and without contrast to assess for underlying structural abnormality. She will be scheduled for Neurocognitive testing. Check serum paraneoplastic panel. Certainly if she has severe depression, psychotic depression is also a consideration. It is unclear if her multiple medications are contributing, but we discussed weaning Gabapentin down over the next 2 weeks to '600mg'$  BID. She will start taking the Duloxetine '30mg'$  daily regularly for the facial pain (and depression), continue nortriptyline '150mg'$  qhs for now. She has not had Tramadol in a while. Continue close supervision. Advanced directives discussed, including appointing a POA. Follow-up after MRI, call for any changes.    Thank you for allowing me to participate in her care.  Please do not hesitate to call for any questions or concerns.    Ellouise Newer, M.D.   CC: Dr. Diona Browner

## 2022-03-19 LAB — PARANEOPLASTIC AB
AGNA-1: NEGATIVE
Amphiphysin Antibody: NEGATIVE
Anti-Hu Ab: NEGATIVE
Anti-Ri Ab: NEGATIVE
Anti-Yo Ab: NEGATIVE
Antineruonal nuclear Ab Type 3: NEGATIVE
CASPR2 Antibody,Cell-based IFA: NEGATIVE
CRMP-5 IgG: NEGATIVE
Interpretation: NEGATIVE
LGI1 Antibody, Cell-based IFA: NEGATIVE
Purkinje Cell Cyto Ab Type 2: NEGATIVE
Purkinje Cell Cyto Ab Type Tr: NEGATIVE
VGCC Antibody: <1 pmol/L (ref 0.0–30.0)

## 2022-03-23 ENCOUNTER — Telehealth: Payer: Self-pay

## 2022-03-23 DIAGNOSIS — R4689 Other symptoms and signs involving appearance and behavior: Secondary | ICD-10-CM

## 2022-03-23 NOTE — Telephone Encounter (Signed)
Pt sister called informed that pt MRI is scheduled for Nov 11th and that we will be ordering neuropsych testing, one of the front office will call to get her scheduled,

## 2022-03-23 NOTE — Telephone Encounter (Signed)
-----   Message from Cameron Sprang, MD sent at 03/23/2022 11:31 AM EDT ----- Regarding: MRI, Neuropsych I see her MRI scheduled for Nov 11, can you pls call her sister Harriett to let her know about date? [place] is super delusional and may think family is against her, so if MRI called her to schedule, she may not have told Harriett who brings her to appts now.  Also, pls order Neuropsych for her, thanks!!

## 2022-04-05 ENCOUNTER — Other Ambulatory Visit: Payer: Self-pay | Admitting: *Deleted

## 2022-04-05 DIAGNOSIS — N200 Calculus of kidney: Secondary | ICD-10-CM

## 2022-04-06 ENCOUNTER — Ambulatory Visit: Payer: Medicare Other | Admitting: Neurology

## 2022-04-07 ENCOUNTER — Ambulatory Visit
Admission: RE | Admit: 2022-04-07 | Discharge: 2022-04-07 | Disposition: A | Discharge status: Home / Self Care | Payer: Medicare Other | Source: Ambulatory Visit | Attending: Urology | Admitting: Urology

## 2022-04-07 ENCOUNTER — Encounter: Payer: Self-pay | Admitting: Neurology

## 2022-04-07 ENCOUNTER — Ambulatory Visit: Payer: Medicare Other | Admitting: Urology

## 2022-04-07 ENCOUNTER — Encounter: Payer: Self-pay | Admitting: Urology

## 2022-04-07 VITALS — BP 134/87 | HR 93 | Ht <= 58 in | Wt 127.0 lb

## 2022-04-07 DIAGNOSIS — N200 Calculus of kidney: Secondary | ICD-10-CM | POA: Diagnosis not present

## 2022-04-07 DIAGNOSIS — Z87442 Personal history of urinary calculi: Secondary | ICD-10-CM | POA: Diagnosis not present

## 2022-04-07 DIAGNOSIS — I878 Other specified disorders of veins: Secondary | ICD-10-CM | POA: Diagnosis not present

## 2022-04-07 MED ORDER — POTASSIUM CITRATE ER 15 MEQ (1620 MG) PO TBCR: 2.0000 | EXTENDED_RELEASE_TABLET | Freq: Two times a day (BID) | ORAL | 11 refills | Status: DC

## 2022-04-07 NOTE — Progress Notes (Signed)
Nephrolithiasis  04/07/2022 12:06 PM   ELEONOR OCON 09/16/1945 [id number]  Reason for visit: Follow up nephrolithiasis  HPI: 76 year old female who was found to have a left partial staghorn kidney stone on CT in August 2022.  She has a history of uric acid stone and was [facility].  She opted for trial of potassium citrate for dissolution with no improvement after a few months, and she opted for ureteroscopy.  She underwent uncomplicated left ureteroscopy, laser lithotripsy for her large left partial staghorn stone in January 2023.  There is a small 1 cm portion of stone in the lower pole that could not be accessed or visualized with the ureteroscope.  She denies any problems since our last visit, specifically any gross hematuria or stone events.  I personally viewed and interpreted her KUB today that shows a stable 1 cm left lower pole stone.  We discussed general stone prevention strategies including adequate hydration with goal of producing 2.5 L of urine daily, increasing citric acid intake, increasing calcium intake during high oxalate meals, minimizing animal protein, and decreasing salt intake. Information about dietary recommendations given today.   Potassium citrate refilled with her history of uric acid stones RTC 9 months KUB, if stable space to yearly surveillance   B[place]ley [place], [place] [address] [place], [place] [address] [phone]

## 2022-04-07 NOTE — Patient Instructions (Signed)

## 2022-04-08 ENCOUNTER — Ambulatory Visit: Payer: Medicare Other | Admitting: Neurology

## 2022-04-09 ENCOUNTER — Ambulatory Visit
Admission: RE | Admit: 2022-04-09 | Discharge: 2022-04-09 | Disposition: A | Discharge status: Home / Self Care | Payer: Medicare Other | Source: Ambulatory Visit | Attending: Neurology | Admitting: Neurology

## 2022-04-09 DIAGNOSIS — R4689 Other symptoms and signs involving appearance and behavior: Secondary | ICD-10-CM

## 2022-04-09 DIAGNOSIS — I6789 Other cerebrovascular disease: Secondary | ICD-10-CM | POA: Diagnosis not present

## 2022-04-09 MED ORDER — GADOPICLENOL 0.5 MMOL/ML IV SOLN
7.5000 mL | Freq: Once | INTRAVENOUS | Status: AC | PRN
  Administered 2022-04-09: 7.5 mL via INTRAVENOUS

## 2022-04-12 ENCOUNTER — Telehealth: Payer: Self-pay | Admitting: Internal Medicine

## 2022-04-12 NOTE — Telephone Encounter (Signed)
Called patient. Phone rang 3-4 times before the busy tone. Will attempt to call back later.

## 2022-04-12 NOTE — Telephone Encounter (Signed)
Patient would like the nurse to call regarding some coughing and congestion she is having.  She has scheduled an appt. For 11/17 but would also like to talk with the nurse.

## 2022-04-15 ENCOUNTER — Ambulatory Visit: Payer: Medicare Other | Admitting: Primary Care

## 2022-04-22 NOTE — Telephone Encounter (Signed)
Patient was a no show to her appt on 04/15/22.   Called patient again today. The phone rang 4 times before receiving a busy tone. Will close encounter since this was the 2nd attempt.

## 2022-04-25 ENCOUNTER — Telehealth: Payer: Self-pay | Admitting: Anesthesiology

## 2022-04-25 NOTE — Telephone Encounter (Signed)
Patient not available, husband answered phone. Will call back tomorrow to discuss MRI (no acute changes, no changes on brain MRI to explain symptoms).

## 2022-04-25 NOTE — Telephone Encounter (Signed)
Pt's sister Reino Bellis called to request MRI results. MRI was done 04/12/2022. Call back number is [phone].

## 2022-04-27 NOTE — Telephone Encounter (Signed)
Spoke to sister. Discussed brain MRI was unremarkable  Thanksgiving, dtr invited her to her house. She said she needed a ride, grandson was going to get her, I think I have a ride. Her way to get her was [name] apparently, so she did not get there. Dtr came to her house and [employer] asked if dtr had anything she had to do with Harriett being on DPR, dtr fine with this but as soon as Elta Guadeloupe comes here to get me, she is coming off and I will put him on there. Like flipping a switch, like she's Becky. They have been letting her driver bec brother is in hospital.   Has not paid for her phone, does not have a phone. Used her [employer]. Discussed proceeding with seeing Psychiatry, which she did agree with seeing one.

## 2022-05-02 ENCOUNTER — Ambulatory Visit: Payer: Medicare Other

## 2022-05-03 ENCOUNTER — Encounter: Payer: Medicare Other | Admitting: Psychology

## 2022-05-06 ENCOUNTER — Telehealth: Payer: Self-pay | Admitting: Internal Medicine

## 2022-05-06 MED ORDER — AZITHROMYCIN 250 MG PO TABS: 250.0000 mg | ORAL_TABLET | ORAL | 0 refills | Status: DC

## 2022-05-06 NOTE — Telephone Encounter (Signed)
Discolored sputum suggests possible bacterial infection. Please send Zpak 250 mg, # 6, 2 today then one daily

## 2022-05-06 NOTE — Telephone Encounter (Signed)
Called and spoke with pt's sister letting her know recs per CY and she verbalized understanding. Rx sent to preferred pharmacy. Nothing further needed.

## 2022-05-06 NOTE — Telephone Encounter (Signed)
Primary Pulmonologist: Young Last office visit and with whom: 11/16/2021 Young What do we see them for (pulmonary problems): chronic bronchitis Last OV assessment/plan:    Assessment & Plan Note by Deneise Lever, MD at 11/16/2021 4:16 PM  Author: Deneise Lever, MD Author Type: Physician Filed: 11/16/2021  4:16 PM  Note Status: Written Cosign: Cosign Not Required Encounter Date: 11/16/2021  Problem: Headache  Editor: Deneise Lever, MD (Physician)             Reports incidental headache at this visit and indicates she has medication at home to deal with it.        Assessment & Plan Note by Deneise Lever, MD at 11/16/2021 4:14 PM  Author: Deneise Lever, MD Author Type: Physician Filed: 11/16/2021  4:15 PM  Note Status: Written Cosign: Cosign Not Required Encounter Date: 11/16/2021  Problem: COPD (chronic obstructive pulmonary disease) ([place])  Editor: Deneise Lever, MD (Physician)             Chronic bronchitis following long secondhand smoke exposure while married. Plan-Trelegy was too expensive.  Try changing it to [facility] sample and prescription with discussion        Patient Instructions by Deneise Lever, MD at 11/16/2021 10:30 AM  Author: Deneise Lever, MD Author Type: Physician Filed: 11/16/2021 10:39 AM  Note Status: Signed Cosign: Cosign Not Required Encounter Date: 11/16/2021  Editor: Deneise Lever, MD (Physician)             Sample and script printed for Breztri inhaler    inhale 2 puffs then rinse mouth, twice daily Try this instead of Trelegy.    Please call if we can help       Orthostatic Vitals Recorded in This Encounter   11/16/2021 1025     Patient Position: Sitting  BP Location: Left Arm  Cuff Size: Normal   Instructions   Return in about 1 year (around 11/17/2022). Sample and script printed for Breztri inhaler    inhale 2 puffs then rinse mouth, twice daily Try this instead of Trelegy.    Please call if we can help      Was  appointment offered to patient (explain)?  no   Reason for call: cough for 3 weeks with yellow mucous and sometimes green mucous.  Has not taken temperature.  She has been done her breathing treatments.  No sick contacts.  She has not tested for covid, but can test when she gets home.  She is having some wheezing and sob with the cough.  She is taking trelegy inhaler.  In the beginning she took delsym, it helped some, but did not make it go away.   Dr. Annamaria Boots, please advise.  Thank you.    (examples of things to ask: : When did symptoms start? Fever? Cough? Productive? Color to sputum? More sputum than usual? Wheezing? Have you needed increased oxygen? Are you taking your respiratory medications? What over the counter measures have you tried?)  Allergies  Allergen Reactions   Penicillins Anaphylaxis, Rash and Other (See Comments)    Has patient had a PCN reaction causing immediate rash, facial/tongue/throat swelling, SOB or lightheadedness with hypotension: Yes Has patient had a PCN reaction causing severe rash involving mucus membranes or skin necrosis: No Has patient had a PCN reaction that required hospitalization No Has patient had a PCN reaction occurring within the last 10 years: No If all of the above answers are "NO", then may proceed  with Cephalosporin use.   Venlafaxine Nausea Only   Sulfonamide Derivatives Rash    Immunization History  Administered Date(s) Administered   Fluad Quad(high Dose 65+) 03/05/2019, 03/27/2020, 05/06/2021   Influenza Split 05/29/2012   Influenza Whole 03/07/2005   Influenza, High Dose Seasonal PF 06/21/2018   Influenza,inj,Quad PF,6+ Mos 01/20/2014, 01/22/2015, 02/04/2016, 02/07/2017   PFIZER(Purple Top)SARS-COV-2 Vaccination 07/05/2019, 07/30/2019, 07/21/2020   Pneumococcal Conjugate-13 01/20/2014   Pneumococcal Polysaccharide-23 10/21/2011   Td 07/02/1998, 03/27/2020   Zoster Recombinat (Shingrix) 04/07/2020, 02/03/2022

## 2022-05-06 NOTE — Telephone Encounter (Signed)
Called the pt and there was no answer- LMTCB    Pended rx

## 2022-05-31 ENCOUNTER — Telehealth: Payer: Self-pay | Admitting: Family Medicine

## 2022-05-31 DIAGNOSIS — R7303 Prediabetes: Secondary | ICD-10-CM

## 2022-05-31 DIAGNOSIS — E7800 Pure hypercholesterolemia, unspecified: Secondary | ICD-10-CM

## 2022-05-31 NOTE — Telephone Encounter (Signed)
-----   Message from Velna Hatchet, RT sent at 05/16/2022  4:19 PM EST ----- Regarding: Fri 1/5 lab Patient is scheduled for cpx, please order future labs.  Thanks, Anda Kraft

## 2022-06-01 ENCOUNTER — Telehealth: Payer: Self-pay

## 2022-06-01 NOTE — Progress Notes (Cosign Needed)
Reason for Encounter: Appointment Reminder  Unsuccessful outreach to the patient as she no longer has a telephone.Spoke with Sister Harriett and patient is being worked up for neurological deficits as the patient is not paying bills and is delusional.  Contacted patient on 06/01/2022   Recent office visits:  02/03/22-Amy Bedsole,MD(PCP)- mass left side neck,referral for [place], shingles shot given-Treat with Tylenol 650 mg p.o. 3 times daily as needed, heat, massage and upper back stretches.   Please decrease gabapentin to 1 and 1/2 tablet of 600 mg gabapentin Three times daily.  12/31/21-Amy Bedsole,MD(PCP)- f/u delusion,decrease gabapentin down to 1 tab of 600 mg in AM, 2 in mid day and 2 at bedtime... if tolerating decrease down further after 1 week to 1 in am, 1 mid day and 2 in PM.Unclear etiology.I will message neurology to determine if we can work on decreasing some of her sedating medications. Referral for psychiatry. 12/02/21-Amy Bedsole,MD(PCP)-f/u HLD,no medication changes f/u 6 months  Recent consult visits:  03/16/22-Karen Aquino,MD(neuro)-Cognitive and behavioral changes.Patients sister will control phone for awhile. Referral for MRI of Brain.(04/15/22)phone call- MRI results- no acute changes. 11/16/21-Clinton Young,MD(pulmo)-f/u bronchitis,Plan-Trelegy was too expensive.  Try changing it to [facility] sample and prescription with discussion   Hospital visits:  None in previous 6 months  Medications: Outpatient Encounter Medications as of 06/01/2022  Medication Sig   acetaminophen (TYLENOL) 500 MG tablet Take 1,000 mg by mouth every 4 (four) hours as needed for mild pain, moderate pain or headache.    albuterol (VENTOLIN HFA) 108 (90 Base) MCG/ACT inhaler INHALE 2 PUFFS BY MOUTH EVERY 6 HOURS AS NEEDED FOR WHEEZING FOR SHORTNESS OF BREATH   azithromycin (ZITHROMAX) 250 MG tablet Take 1 tablet (250 mg total) by mouth as directed.   benzonatate (TESSALON) 200 MG capsule Take 1 capsule (200 mg  total) by mouth 3 (three) times daily as needed for cough.   Cholecalciferol (VITAMIN D) 50 MCG (2000 UT) tablet Take 2,000 Units by mouth daily.   diclofenac Sodium (VOLTAREN) 1 % GEL Apply 1 application topically 2 (two) times daily as needed (pain).   DULoxetine (CYMBALTA) 30 MG capsule Take 1 tablet every night   gabapentin (NEURONTIN) 600 MG tablet Take 1 tablet three times a day   ipratropium-albuterol (DUONEB) 0.5-2.5 (3) MG/3ML SOLN Take 3 mLs by nebulization every 6 (six) hours as needed.   Multiple Vitamins-Minerals (GNP HAIR/SKIN/NAILS PO) Take 3 tablets by mouth daily.   nortriptyline (PAMELOR) 50 MG capsule Take 3 capsules every night   ondansetron (ZOFRAN) 4 MG tablet Take 4 mg by mouth every 8 (eight) hours as needed.   Potassium Citrate 15 MEQ (1620 MG) TBCR Take 2 tablets by mouth in the morning and at bedtime.   SF 5000 PLUS 1.1 % CREA dental cream Place 1 application  onto teeth at bedtime.   traMADol (ULTRAM) 50 MG tablet TAKE 1/2 TO 1 (ONE-HALF TO ONE) TABLET BY MOUTH ONCE DAILY AS NEEDED FOR SEVERE PAIN   TRELEGY ELLIPTA 100-62.5-25 MCG/ACT AEPB Inhale 1 puff into the lungs daily.   vitamin B-12 (CYANOCOBALAMIN) 1000 MCG tablet Take 1,000 mcg by mouth daily.   No facility-administered encounter medications on file as of 06/01/2022.   Lab Results  Component Value Date/Time   HGBA1C 5.9 10/29/2021 10:38 AM   HGBA1C 6.2 05/06/2021 03:46 PM   MICROALBUR 9.7 (H) 09/03/2019 12:33 PM    BP Readings from Last 3 Encounters:  04/07/22 134/87  03/16/22 (!) 164/97  02/03/22 138/88  Patient contacted to confirm telephone appointment with Charlene Brooke, PharmD, on 06/06/2022 at 9:00. Spoke with sister Reino Bellis, appointment is cancelled at this time due to patient does not have a phone. Patient is currently being worked up for neurological deficits.  Do you have any problems getting your medications? No   What is your top health concern you would like to discuss at your  upcoming visit? Patient continues to have delusions.  Have you seen any other providers since your last visit with PCP? Yes- neurology,pulmonology    Star Rating Drugs:  Medication:  Last Fill: Day Supply No star medications identified   Care Gaps: Annual wellness visit in last year? No  Charlene Brooke, CPP notified  [name], [place]  [phone]

## 2022-06-02 ENCOUNTER — Ambulatory Visit (INDEPENDENT_AMBULATORY_CARE_PROVIDER_SITE_OTHER): Payer: Medicare Other | Admitting: Psychology

## 2022-06-02 ENCOUNTER — Ambulatory Visit: Payer: Medicare Other | Admitting: Psychology

## 2022-06-02 ENCOUNTER — Encounter: Payer: Self-pay | Admitting: Psychology

## 2022-06-02 DIAGNOSIS — F22 Delusional disorders: Secondary | ICD-10-CM

## 2022-06-02 DIAGNOSIS — R4189 Other symptoms and signs involving cognitive functions and awareness: Secondary | ICD-10-CM

## 2022-06-02 NOTE — Progress Notes (Addendum)
NEUROPSYCHOLOGICAL EVALUATION [place]. [facility] Department of Neurology  Date of Evaluation: June 02, 2022  Reason for Referral:   Katie Zimmerman is a 77 y.o. right-handed Caucasian female referred by Ellouise Newer, M.D., to characterize her current cognitive functioning and assist with diagnostic clarity and treatment planning in the context of family reporting of delusional thinking and increased susceptibility to online financial scams.   Assessment and Plan:   Clinical Impression(s): Katie Zimmerman pattern of performance is suggestive of neuropsychological functioning largely within normal limits relative to age-matched peers. Some variability was exhibited across basic attention and verbal fluency. However, no consistent impairments were exhibited. Performances were otherwise appropriate across processing speed, complex attention, executive functioning, receptive language, confrontation naming, visuospatial abilities, and all aspects of learning and memory. Katie Zimmerman denied difficulties completing instrumental activities of daily living (ADLs) independently. She does not meet diagnostic criteria for a neurocognitive disorder at the present time.   Specific to memory, Katie Zimmerman was able to learn novel verbal and visual information efficiently and retain this knowledge after lengthy delays. Overall, memory performance combined with intact performances across other areas of cognitive functioning is not suggestive of Alzheimer's disease. Likewise, her cognitive and behavioral profile is not suggestive of any other form of neurodegenerative illness presently.  Given normal neurocognitive functioning and benign findings across recent neuroimaging, the source of Katie Zimmerman's significant delusional thinking appears far more likely to be psychiatric rather than neurological. Assurances that these symptoms are not being caused by medication side effects will also be important.  Regarding these thoughts, it is quite clear that Katie Zimmerman is being actively scammed. Per her sister, Katie Zimmerman is regularly engaged in actual back and forth conversations with an unknown entity. This entity has reportedly told Katie Zimmerman that they "love" and care for her and that Katie Zimmerman has referred to this entity as her "husband" on at least one occasion. Outside of this, significant delusions surrounding this individual being [name] are very apparent. Appropriate psychiatric treatment will be extremely important moving forward, especially given the current financial situation.  Recommendations: To treat delusional thinking, Katie Zimmerman should seek a referral to a local psychiatrist. She can also contact these offices directly. Some local providers include:  Dr. Modena Morrow - [phone] [facility]) - [facility] [place]) - [phone] Dr. Chucky May [facility]) [phone] [facility] [place]) [phone] [place] ([place]) - [phone] [facility] [place]) - [place], [address] [place], [place] Dr. Garner Nash (neuropsychiatry); Mike Craze; [facility]; 9th Floor; [place], [place] Dr. Norman Clay; [place]; [place]; [place], [place]  Given ongoing financial implications and the likelihood that Katie Zimmerman is losing large amounts of money and defaulting with other actual organizations, more drastic steps may become necessary to stop these behaviors until she is able to achieve appropriate psychiatric care. This could include the removal of her access to her iPad or the deletion or use restriction of the application she is using to remain in contact with her scammers.   Performance across  neurocognitive testing is not a strong predictor of an individual's safety operating a motor vehicle. Should her family wish to pursue a formalized driving evaluation, they could reach out to the following agencies: [employer] in [place]: [phone] [facility]: [facility]: [place]: [phone]  or [phone]  Katie Zimmerman is encouraged to attend to lifestyle factors for brain health (e.g., regular physical exercise, good nutrition habits, regular participation in cognitively-stimulating activities, and general stress management techniques), which are likely to have benefits for both emotional adjustment and cognition. In fact, in addition to promoting good general health, regular exercise incorporating aerobic activities (e.g., brisk walking, jogging, cycling, etc.) has been demonstrated to be a very effective treatment for depression and stress, with similar efficacy rates to both antidepressant medication and psychotherapy. Optimal control of vascular risk factors (including safe cardiovascular exercise and adherence to dietary recommendations) is encouraged. Continued participation in activities which provide mental stimulation and social interaction is also recommended.   To address problems with fluctuating attention, she may wish to consider:   -Avoiding external distractions when needing to concentrate    -Limiting exposure to fast paced environments with multiple sensory demands   -Writing down complicated information and using checklists    -Attempting and completing one task at a time (i.e., no multi-tasking)   -Verbalizing aloud each step of a task to maintain focus   -Reducing the amount of information considered at one time  Review of Records:   Katie Zimmerman was seen by [facility] KitchenEllouise Newer, M.D.) on 01/15/2018 for an evaluation of postherpetic neuralgia. Briefly, Ms. Rios experienced shingles  affecting the right side of her face in 2000. She did well for a year or two with no symptoms but then started having significant pain on the right side of her face. Symptoms included pressure and tenderness when she washes or touches her cheek on the right side of her nose. It is painful to talk, with pain radiating from the right temporal region down the right side of her tongue. She does have a prior diagnosis of TMJ on the right as well. She had been trialed on different medications over the past few years and found gabapentin to be helpful for a while before it stopped working. For the past few months, she has had a constant 10/10 pain (appeared comfortable in the office on that day). For a time she was having headaches with pressure over the vertex, but none lately. She also reported chronic back pain. Medications were adjusted. She was again seen by Dr. Delice Lesch for follow-up over the years with similar concerns and ongoing medication adjustments.  Notably, she was seen by Dr. Delice Lesch for follow-up on 03/16/2022. During this appointment, concerns surrounding delusional thoughts were expressed by her family. Per family, Katie Zimmerman had developed a strongly fixed belief that [name] has been contacting her online and giving her money. Her family first became alerted to this around 6 months prior when she was unable to pay her niece to fix her hair but stated she was going to get a lot of money because [name] was going to give her money. She reported speaking with him online. There was prior instance where informed family that she stayed overnight with [name] at a local [employer] [employer] despite the [employer] department determining that there was no indication that she was ever at this hotel. Katie Zimmerman has become extremely protective of her iPad and does not allow others to look at it or her correspondences. There are also concerns that she has been using her phone to buy and send gift  cards to [name] at his request. Family have expressed concern as Katie Zimmerman does not have much money to support herself with. Ultimately, Katie Zimmerman was referred  for a comprehensive neuropsychological evaluation to characterize her cognitive abilities and to assist with diagnostic clarity and treatment planning.   Brain MRI on 04/12/2022 revealed mild microvascular ischemic changes and mild generalized atrophy said to be within limits based upon age.   Past Medical History:  Diagnosis Date   Acute bronchitis with COPD 09/28/2021   Acute left-sided thoracic back pain 02/03/2022   Acute pain of right shoulder 09/30/2020   Arthralgia of temporomandibular joint 11/04/2009   Calculus of gallbladder without cholecystitis without obstruction 02/25/2021   Cancer of right breast, stage 0 01/26/2012   DCIS   CMC arthritis 95/28/4132   Complication of anesthesia    hard to wake up age 31-not since   COPD (chronic obstructive pulmonary disease)    Delusional thoughts 12/07/2021   Elevated blood pressure reading without diagnosis of hypertension 04/06/2019   Fibromyalgia    GERD (gastroesophageal reflux disease)    Headache 02/21/2017   Hearing loss in right ear 01/20/2014   High cholesterol 11/22/2011   History of kidney stones    History of radiation therapy 02/29/12 -04/21/12   right breast, 5040 cGy in 28 fx, boost to total 6240 cGy   Increased endometrial stripe thickness 03/18/2016   Kidney stone    Laceration of left hand without foreign body 09/30/2020   Leukocytosis 01/01/2019   Mass of left side of neck 02/03/2022   Nipple discharge 09/30/2020   Osteoporosis 01/28/2014   Stable DEXA from 2007 to 2015 on tamoxifen   Improved to osteopenia T-2.4 on 2018 DEXA.  2020 worsened T-2.7   Personal history of colonic polyps-adenomas 05/25/2012   Pneumonia 12/19/2018   Post herpetic neuralgia 04/14/2009   right side of face   Prediabetes 11/22/2011   Purpura 11/12/2019   Renal  scarring 01/26/2021   Staghorn calculus 05/04/2009   Syncope 11/13/2020    Past Surgical History:  Procedure Laterality Date   APPENDECTOMY     BIOPSY BREAST  01/10/12   Right breast needle cor biopsy   BREAST BIOPSY  01/10/2012   BREAST CYST ASPIRATION  01/10/2012   BREAST LUMPECTOMY Right 01/26/2012   Right/ERPR+ DCIS, right ax snbx   CYSTOSCOPY/URETEROSCOPY/HOLMIUM LASER/STENT PLACEMENT Left 06/25/2021   Procedure: CYSTOSCOPY/URETEROSCOPY/HOLMIUM LASER/STENT PLACEMENT;  Surgeon: Billey Co, MD;  Location: [facility] ORS;  Service: Urology;  Laterality: Left;   FOOT SURGERY  1996   rt/lt great toes   GLAUCOMA SURGERY  1993   laser   OVARIAN CYST SURGERY  1964   TUBAL LIGATION  1976    Current Outpatient Medications:    acetaminophen (TYLENOL) 500 MG tablet, Take 1,000 mg by mouth every 4 (four) hours as needed for mild pain, moderate pain or headache. , Disp: , Rfl:    albuterol (VENTOLIN HFA) 108 (90 Base) MCG/ACT inhaler, INHALE 2 PUFFS BY MOUTH EVERY 6 HOURS AS NEEDED FOR WHEEZING FOR SHORTNESS OF BREATH, Disp: 9 g, Rfl: 5   azithromycin (ZITHROMAX) 250 MG tablet, Take 1 tablet (250 mg total) by mouth as directed., Disp: 6 tablet, Rfl: 0   benzonatate (TESSALON) 200 MG capsule, Take 1 capsule (200 mg total) by mouth 3 (three) times daily as needed for cough., Disp: 20 capsule, Rfl: 0   Cholecalciferol (VITAMIN D) 50 MCG (2000 UT) tablet, Take 2,000 Units by mouth daily., Disp: , Rfl:    diclofenac Sodium (VOLTAREN) 1 % GEL, Apply 1 application topically 2 (two) times daily as needed (pain)., Disp: , Rfl:    DULoxetine (CYMBALTA) 30 MG  capsule, Take 1 tablet every night, Disp: 90 capsule, Rfl: 3   gabapentin (NEURONTIN) 600 MG tablet, Take 1 tablet three times a day, Disp: 270 tablet, Rfl: 3   ipratropium-albuterol (DUONEB) 0.5-2.5 (3) MG/3ML SOLN, Take 3 mLs by nebulization every 6 (six) hours as needed., Disp: 150 mL, Rfl: 12   Multiple Vitamins-Minerals (GNP HAIR/SKIN/NAILS PO),  Take 3 tablets by mouth daily., Disp: , Rfl:    nortriptyline (PAMELOR) 50 MG capsule, Take 3 capsules every night, Disp: 270 capsule, Rfl: 3   ondansetron (ZOFRAN) 4 MG tablet, Take 4 mg by mouth every 8 (eight) hours as needed., Disp: , Rfl:    Potassium Citrate 15 MEQ (1620 MG) TBCR, Take 2 tablets by mouth in the morning and at bedtime., Disp: 120 tablet, Rfl: 11   SF 5000 PLUS 1.1 % CREA dental cream, Place 1 application  onto teeth at bedtime., Disp: , Rfl:    traMADol (ULTRAM) 50 MG tablet, TAKE 1/2 TO 1 (ONE-HALF TO ONE) TABLET BY MOUTH ONCE DAILY AS NEEDED FOR SEVERE PAIN, Disp: 30 tablet, Rfl: 5   TRELEGY ELLIPTA 100-62.5-25 MCG/ACT AEPB, Inhale 1 puff into the lungs daily., Disp: , Rfl:    vitamin B-12 (CYANOCOBALAMIN) 1000 MCG tablet, Take 1,000 mcg by mouth daily., Disp: , Rfl:   Clinical Interview:   The following information was obtained during a clinical interview with Katie Zimmerman and her sister prior to cognitive testing.  Cognitive Symptoms: Decreased short-term memory: Denied. Decreased long-term memory: Denied. Decreased attention/concentration: Denied. Reduced processing speed: Denied. Difficulties with executive functions: Denied. Difficulties with emotion regulation: Denied. Difficulties with receptive language: Denied. Difficulties with word finding: Denied. Decreased visuoperceptual ability: Denied.  Trajectory of deficits: Ms. Odriscoll denied all cognitive concerns. Her sister was largely in agreement with this.   Difficulties completing ADLs: Somewhat. No concerns surrounding medication management were expressed. Ms. Price continues to drive. Her sister described her driving as "erratic" when she followed behind her recently. However, this was attributed to Ms. Parcher having cataracts and diminished vision which impacts her driving at night. She did not describe instances where she has gotten lost or any recent accidents and restricts nighttime driving whenever  possible. Financially, Ms. Venuti's sister expressed significant concerns. Briefly, Ms. Stare appears to be engaged in regular correspondence with an unknown entity with a messaging application on her iPad whom Ms. Wilcoxson firmly believes is [employer]. Her sister has been able to minimally view these conversations and could confirm that conversations are indeed going on. She noted that this entity has told Ms. Stembridge that they "love her" and otherwise care for her and that she has referred to them as her "husband" (her actual husband passed away several years prior). She routinely uses personal money to purchase gift cards to provide to this entity, leading her family to believe that she is actively being scammed out of a large amount of money. This has been justified by Ms. Parsley informing individuals that she is simply spending [employer] money and that he has promised to refund her and provide her a new car in the future. Ms. Eckert has reportedly recently requested anywhere from $200 to $500 from family members to acquiesce to these requests. Her sister noted that Ms. Comunale will become extremely upset when confronted surrounding the extreme unlikelihood that she is speaking with [name] and is in fact actively being scammed. Her sister also alluded to delinquencies with [employer] and other businesses due to financial constraints.  Additional Medical History: History of traumatic brain injury/concussion: Denied. She did report falling and striking the front of her face on the ground about two years prior. She reportedly was checked out at a local hospital and not diagnosed with a concussion. No other potential head injuries were reported.  History of stroke: Denied. History of seizure activity: Denied. History of known exposure to toxins: Denied. Symptoms of chronic pain: Endorsed. Consistent with medical records, she reported right facial pain ongoing for the past several years.   Experience of frequent headaches/migraines: Denied. Frequent instances of dizziness/vertigo: Denied.  Sensory changes: While she reported cataracts, she denied significant vision impairment outside of at night. She reported hearing loss in her right ear. Other sensory changes/difficulties (e.g., taste, smell) were denied.  Balance/coordination difficulties: Denied. Other motor difficulties: Denied.  Sleep History: Estimated hours obtained each night: 6-8 hours.  Difficulties falling asleep: Denied. Difficulties staying asleep: Denied. Feels rested and refreshed upon awakening: Endorsed.  History of snoring: Endorsed. History of waking up gasping for air: Denied. Witnessed breath cessation while asleep: Denied.  History of vivid dreaming: Denied. Excessive movement while asleep: Denied. Instances of acting out her dreams: Denied.  Psychiatric/Behavioral Health History: Depression: She described her current mood as "pretty good" and denied to her knowledge any prior mental health concerns or diagnoses. Current or remote suicidal ideation, intent, or plan was denied.  Anxiety: Denied. Mania: Denied. Trauma History: Denied. Visual/auditory hallucinations: Denied. Delusional thoughts: Endorsed (see above sections).  Tobacco: Denied. Alcohol: She reported very rare alcohol consumption and denied a history of problematic alcohol abuse or dependence.  Recreational drugs: Denied.  Family History: Problem Relation Age of Onset   Hypertension Mother    Cancer Mother        LIVER cancer   Hypertension Father    Diabetes Father    Heart failure Father    Breast cancer Sister 56       NOT hormone receptor breast cancer   Breast cancer Maternal Aunt 75   Anxiety disorder Maternal Aunt    Mental illness Maternal Aunt    Dementia Maternal Aunt        advanced age; "chemically induced"   Breast cancer Cousin 43       maternal cousin   Cancer Cousin 39       ? lung cancer; maternal  cousin   Dementia Maternal Uncle        advanced age   Colon cancer Neg Hx    Stomach cancer Neg Hx    This information was confirmed by Ms. Clarin.  Academic/Vocational History: Highest level of educational attainment: 13 years. She graduated from high school and completed one additional year of business college. She described herself as a good (A/B) student in academic settings. No relative weaknesses were identified.  History of developmental delay: Denied. History of grade repetition: Denied. Enrollment in special education courses: Denied. History of LD/ADHD: Denied.  Employment: Retired. 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   Evaluation Results:   Behavioral Observations: Ms. Rothschild was accompanied by her sister, arrived to her appointment on time, and was appropriately dressed and groomed. She appeared alert and oriented. Observed gait and station were within normal limits. Gross motor functioning appeared intact upon informal observation and no abnormal movements (e.g., tremors) were noted. Her affect was generally relaxed and positive, but did range appropriately given the subject being discussed during the clinical interview or the  task at hand during testing procedures. Spontaneous speech was fluent and word finding difficulties were not observed during the clinical interview. Thought processes were coherent, organized, and normal in content. Insight into her cognitive difficulties appeared appropriate.   During testing, sustained attention was appropriate. Task engagement was adequate and she persisted when challenged. Overall, Ms. Farnell was cooperative with the clinical interview and subsequent testing procedures.   Adequacy of Effort: The validity of neuropsychological testing is limited by the extent to which the individual being tested may be assumed to have exerted adequate effort during testing. Ms. Almendariz  expressed her intention to perform to the best of her abilities and exhibited adequate task engagement and persistence. Scores across stand-alone and embedded performance validity measures were within expectation. As such, the results of the current evaluation are believed to be a valid representation of Ms. Nolasco's current cognitive functioning.  Test Results: Ms. Weiand was fully oriented at the time of the current evaluation. When asked why she was here, she responded "My sister tries to say I'm crazy."  Intellectual abilities based upon educational and vocational attainment were estimated to be in the average range. Premorbid abilities were estimated to be within the average range based upon a single-word reading test.   Processing speed was average. Basic attention was variable, ranging from the well below average to average normative ranges. More complex attention (e.g., working memory) was average to above average. Executive functioning was below average to average. She did perform in the exceptionally high range across a task assessing safety and judgment.   Assessed receptive language abilities were above average. Likewise, Ms. Brunke did not exhibit any difficulties comprehending task instructions and answered all questions asked of her appropriately. Assessed expressive language was variable. Phonemic fluency was well below average to below average, semantic fluency was well below average to average, and confrontation naming was average to above average.    Assessed visuospatial/visuoconstructional abilities were below average to average.    Learning (i.e., encoding) of novel verbal information was well above average. Spontaneous delayed recall (i.e., retrieval) of previously learned information was average to well above average. Retention rates were 100% across a story learning task, 100% across a list learning task, and 78% across a figure drawing task. Performance across recognition tasks  was strong, suggesting evidence for information consolidation.   Results of emotional screening instruments suggested that recent symptoms of generalized anxiety were in the minimal range, while symptoms of depression were within normal limits. A screening instrument assessing recent sleep quality suggested the presence of minimal sleep dysfunction.  Tables of Scores:   Note: This summary of test scores accompanies the interpretive report and should not be considered in isolation without reference to the appropriate sections in the text. Descriptors are based on appropriate normative data and may be adjusted based on clinical judgment. Terms such as "Within Normal Limits" and "Outside Normal Limits" are used when a more specific description of the test score cannot be determined.       Percentile - Normative Descriptor > 98 - Exceptionally High 91-97 - Well Above Average 75-90 - Above Average 25-74 - Average 9-24 - Below Average 2-8 - Well Below Average < 2 - Exceptionally Low       Orientation:      Raw Score Percentile   NAB Orientation, Form 1 29/29 --- ---       Cognitive Screening:      Raw Score Percentile   SLUMS: 28/30 --- ---  RBANS, Form A: Standard Score/ Scaled Score Percentile   Total Score 100 50 Average  Immediate Memory 129 2 Well Above Average    List Learning 15 95 Well Above Average    Story Memory 15 95 Well Above Average  Visuospatial/Constructional 89 23 Below Average    Figure Copy 10 50 Average    Line Orientation 13/20 10-16 Below Average  Language 88 21 Below Average    Picture Naming 10/10 51-75 Average    Semantic Fluency 5 5 Well Below Average  Attention 15 8 Well Below Average    Digit Span 4 2 Well Below Average    Coding 9 37 Average  Delayed Memory 117 87 Above Average    List Recall 9/10 >75 Above Average    List Recognition 20/20 51-75 Average    Story Recall 15 95 Well Above Average    Story Recognition 12/12 69+ Average    Figure  Recall 11 63 Average    Figure Recognition 8/8 70+ Average        Intellectual Functioning:      Standard Score Percentile   Test of Premorbid Functioning: 100 50 Average       Attention/Executive Function:     Trail Making Test (TMT): Raw Score (T Score) Percentile     Part A 42 secs.,  0 errors (45) 31 Average    Part B 139 secs.,  1 error (40) 16 Below Average         Scaled Score Percentile   WAIS-IV Digit Span: 10 50 Average    Forward 8 25 Average    Backward 12 75 Above Average    Sequencing 9 37 Average        Scaled Score Percentile   WAIS-IV Similarities: 9 37 Average       D-KEFS Color-Word Interference Test: Raw Score (Scaled Score) Percentile     Color Naming 32 secs. (11) 63 Average    Word Reading 24 secs. (11) 63 Average    Inhibition 87 secs. (8) 25 Average      Total Errors 2 errors (11) 63 Average    Inhibition/Switching 79 secs. (10) 50 Average      Total Errors 3 errors (10) 50 Average       D-KEFS Verbal Fluency Test: Raw Score (Scaled Score) Percentile     Letter Total Correct 22 (6) 9 Below Average    Category Total Correct 33 (10) 50 Average    Category Switching Total Correct 9 (6) 9 Below Average    Category Switching Accuracy 8 (7) 16 Below Average      Total Set Loss Errors 0 (13) 84 Above Average      Total Repetition Errors 0 (13) 84 Above Average       NAB Executive Functions Module, Form 1: T Score Percentile     Judgment 70 98 Exceptionally High       Language:     Verbal Fluency Test: Raw Score (T Score) Percentile     Phonemic Fluency (FAS) 22 (35) 7 Well Below Average    Animal Fluency 16 (45) 31 Average        NAB Language Module, Form 1: T Score Percentile     Auditory Comprehension 58 79 Above Average    Naming 30/31 (60) 84 Above Average       Visuospatial/Visuoconstruction:      Raw Score Percentile   Clock Drawing: 10/10 --- Within Normal Limits  NAB Spatial Module, Form 1: T Score Percentile     Visual  Discrimination 45 31 Average        Scaled Score Percentile   WAIS-IV Block Design: 10 50 Average       Mood and Personality:      Raw Score Percentile   Geriatric Depression Scale: 2 --- Within Normal Limits  Geriatric Anxiety Scale: 7 --- Minimal    Somatic 4 --- Minimal    Cognitive 1 --- Minimal    Affective 2 --- Minimal       Additional Questionnaires:      Raw Score Percentile   PROMIS Sleep Disturbance Questionnaire: 13 --- None to Slight   Informed Consent and Coding/Compliance:   The current evaluation represents a clinical evaluation for the purposes previously outlined by the referral source and is in no way reflective of a forensic evaluation.   Ms. Estupinan was provided with a verbal description of the nature and purpose of the present neuropsychological evaluation. Also reviewed were the foreseeable risks and/or discomforts and benefits of the procedure, limits of confidentiality, and mandatory reporting requirements of this provider. The patient was given the opportunity to ask questions and receive answers about the evaluation. Oral consent to participate was provided by the patient.   This evaluation was conducted by Christia Reading, Ph.D., ABPP-CN, board certified clinical neuropsychologist. Ms. Africa completed a clinical interview with Dr. Melvyn Novas, billed as one unit [phone], and 135 minutes of cognitive testing and scoring, billed as one unit [phone] and four additional units [id number]. Psychometrist Milana Kidney, B.S., assisted Dr. Melvyn Novas with test administration and scoring procedures. As a separate and discrete service, Dr. Melvyn Novas spent a total of 160 minutes in interpretation and report writing billed as one unit [phone] and two units [id number].

## 2022-06-02 NOTE — Progress Notes (Signed)
   Psychometrician Note   Cognitive testing was administered to Katie Zimmerman by Katie Zimmerman, B.S. (psychometrist) under the supervision of Katie Zimmerman, Ph.D., licensed psychologist on 06/02/2022. Katie Zimmerman did not appear overtly distressed by the testing session per behavioral observation or responses across self-report questionnaires. Rest breaks were offered.    The battery of tests administered was selected by Katie Zimmerman, Ph.D. with consideration to Katie Zimmerman's current level of functioning, the nature of her symptoms, emotional and behavioral responses during interview, level of literacy, observed level of motivation/effort, and the nature of the referral question. This battery was communicated to the psychometrist. Communication between Katie Zimmerman, Ph.D. and the psychometrist was ongoing throughout the evaluation and Katie Zimmerman, Ph.D. was immediately accessible at all times. Katie Zimmerman, Ph.D. provided supervision to the psychometrist on the date of this service to the extent necessary to assure the quality of all services provided.    Katie Zimmerman will return within approximately 1-2 weeks for an interactive feedback session with Katie Zimmerman at which time her test performances, clinical impressions, and treatment recommendations will be reviewed in detail. Katie Zimmerman understands she can contact our office should she require our assistance before this time.  A total of 135 minutes of billable time were spent face-to-face with Katie Zimmerman by the psychometrist. This includes both test administration and scoring time. Billing for these services is reflected in the clinical report generated by Katie Zimmerman, Ph.D.  This note reflects time spent with the psychometrician and does not include test scores or any clinical interpretations made by Katie Zimmerman. The full report will follow in a separate note.

## 2022-06-03 ENCOUNTER — Other Ambulatory Visit: Payer: Medicare Other

## 2022-06-06 ENCOUNTER — Telehealth: Payer: Medicare Other

## 2022-06-07 NOTE — Progress Notes (Signed)
Patient has upcoming appointment on January 11 to review this workup with the neurocognitive psychologist.  She also has appointment for a physical with me within the next 2 weeks.  We will review this with the patient and her family and make sure she is set up with a psychiatrist as is recommended. Of note I have placed a referral in the past to psychiatry but I am unsure patient has set this up.

## 2022-06-09 ENCOUNTER — Encounter: Payer: Medicare Other | Admitting: Psychology

## 2022-06-10 ENCOUNTER — Encounter: Payer: Medicare Other | Admitting: Family Medicine

## 2022-06-17 ENCOUNTER — Other Ambulatory Visit (INDEPENDENT_AMBULATORY_CARE_PROVIDER_SITE_OTHER): Payer: Medicare Other

## 2022-06-17 ENCOUNTER — Other Ambulatory Visit: Payer: Medicare Other

## 2022-06-17 DIAGNOSIS — E7800 Pure hypercholesterolemia, unspecified: Secondary | ICD-10-CM | POA: Diagnosis not present

## 2022-06-17 DIAGNOSIS — R7303 Prediabetes: Secondary | ICD-10-CM

## 2022-06-17 LAB — COMPREHENSIVE METABOLIC PANEL
ALT: 12 U/L (ref 0–35)
AST: 16 U/L (ref 0–37)
Albumin: 4.4 g/dL (ref 3.5–5.2)
Alkaline Phosphatase: 92 U/L (ref 39–117)
BUN: 10 mg/dL (ref 6–23)
CO2: 30 mEq/L (ref 19–32)
Calcium: 10 mg/dL (ref 8.4–10.5)
Chloride: 104 mEq/L (ref 96–112)
Creatinine, Ser: 0.81 mg/dL (ref 0.40–1.20)
GFR: 70.49 mL/min (ref >60.00)
Glucose, Bld: 112 mg/dL — ABNORMAL HIGH (ref 70–99)
Potassium: 3.9 mEq/L (ref 3.5–5.1)
Sodium: 143 mEq/L (ref 135–145)
Total Bilirubin: 0.4 mg/dL (ref 0.2–1.2)
Total Protein: 7.4 g/dL (ref 6.0–8.3)

## 2022-06-17 LAB — LIPID PANEL
Cholesterol: 224 mg/dL — ABNORMAL HIGH (ref 0–200)
HDL: 69.8 mg/dL (ref >39.00)
LDL Cholesterol: 127 mg/dL — ABNORMAL HIGH (ref 0–99)
NonHDL: 154.38
Total CHOL/HDL Ratio: 3
Triglycerides: 135 mg/dL (ref 0.0–149.0)
VLDL: 27 mg/dL (ref 0.0–40.0)

## 2022-06-17 LAB — HEMOGLOBIN A1C: Hgb A1c MFr Bld: 5.7 % (ref 4.6–6.5)

## 2022-06-17 NOTE — Progress Notes (Signed)
No critical labs need to be addressed urgently. We will discuss labs in detail at upcoming office visit.   

## 2022-06-21 ENCOUNTER — Ambulatory Visit (INDEPENDENT_AMBULATORY_CARE_PROVIDER_SITE_OTHER): Payer: Medicare Other | Admitting: Psychology

## 2022-06-21 DIAGNOSIS — F22 Delusional disorders: Secondary | ICD-10-CM

## 2022-06-21 NOTE — Progress Notes (Signed)
   Neuropsychology Feedback Session Tillie Rung. [facility] Department of Neurology  Reason for Referral:   Katie Zimmerman is a 77 y.o. right-handed Caucasian female referred by Ellouise Newer, M.D., to characterize her current cognitive functioning and assist with diagnostic clarity and treatment planning in the context of family reporting of delusional thinking and increased susceptibility to online financial scams.   Feedback:   Katie Zimmerman completed a comprehensive neuropsychological evaluation on 06/02/2022. Please refer to that encounter for the full report and recommendations. Briefly, results suggested neuropsychological functioning largely within normal limits relative to age-matched peers. Some variability was exhibited across basic attention and verbal fluency. However, no consistent impairments were exhibited. Specific to memory, Katie Zimmerman was able to learn novel verbal and visual information efficiently and retain this knowledge after lengthy delays. Overall, memory performance combined with intact performances across other areas of cognitive functioning is not suggestive of Alzheimer's disease. Likewise, her cognitive and behavioral profile is not suggestive of any other form of neurodegenerative illness presently. Given normal neurocognitive functioning and benign findings across recent neuroimaging, the source of Katie Zimmerman's significant delusional thinking appears far more likely to be psychiatric rather than neurological.  Katie Zimmerman was accompanied by her sister during the current feedback session. Content of the current session focused on the results of her neuropsychological evaluation. Katie Zimmerman was given the opportunity to ask questions and her questions were answered. Unfortunately, delusional thinking surrounding her perception of regular correspondence with [name] continues to be quite entrenched. I was unable to determine how open she was to ongoing psychiatric  care. She was encouraged to reach out should additional questions arise. A copy of her report was provided at the conclusion of the visit.      38 minutes were spent conducting the current feedback session with Katie Zimmerman, billed as one unit [phone].

## 2022-06-23 ENCOUNTER — Encounter: Payer: Self-pay | Admitting: Family Medicine

## 2022-06-23 ENCOUNTER — Ambulatory Visit (INDEPENDENT_AMBULATORY_CARE_PROVIDER_SITE_OTHER): Payer: Medicare Other | Admitting: Family Medicine

## 2022-06-23 VITALS — BP 120/76 | HR 96 | Temp 98.0°F | Resp 16 | Ht <= 58 in | Wt 118.5 lb

## 2022-06-23 DIAGNOSIS — F22 Delusional disorders: Secondary | ICD-10-CM

## 2022-06-23 DIAGNOSIS — E7800 Pure hypercholesterolemia, unspecified: Secondary | ICD-10-CM | POA: Diagnosis not present

## 2022-06-23 DIAGNOSIS — R221 Localized swelling, mass and lump, neck: Secondary | ICD-10-CM

## 2022-06-23 DIAGNOSIS — E66812 Obesity, class 2: Secondary | ICD-10-CM

## 2022-06-23 DIAGNOSIS — Z0000 Encounter for general adult medical examination without abnormal findings: Secondary | ICD-10-CM | POA: Diagnosis not present

## 2022-06-23 DIAGNOSIS — M810 Age-related osteoporosis without current pathological fracture: Secondary | ICD-10-CM

## 2022-06-23 DIAGNOSIS — R7303 Prediabetes: Secondary | ICD-10-CM | POA: Diagnosis not present

## 2022-06-23 DIAGNOSIS — Z6836 Body mass index (BMI) 36.0-36.9, adult: Secondary | ICD-10-CM

## 2022-06-23 NOTE — Patient Instructions (Addendum)
Work on low cholesterol diet.  Consider starting cholesterol medication like rosuvastatin or zetia. Please consider psychiatry visit.  Please call the location of your choice from the menu below to schedule your Mammogram and/or Bone Density appointment.    [facility]                      Phone:  [phone] [address], [place] [address]                                                             Services: Traditional and 3D Mammogram, [place] [facility] Density                 Phone: [phone] [address], [place] [address]    Service: Bone Density ONLY   *this site does NOT perform mammograms  [facility]                        Phone:  [phone] [address], [place] [address]                                            Services:  3D Mammogram and [place] at [facility]   Phone:  [phone]   [place], [place] [address]                                            Services: 3D Mammogram and [place]  [place] at [facility])  Phone:  [phone]   [address]  [place], [place] [address]                                              Services:  3D Mammogram and Bone Density  

## 2022-06-23 NOTE — Assessment & Plan Note (Signed)
Chronic... she continues to remain entrenched in her delusional thoughts. Continue to be  preyed upon by a scammer financially.  Neurocongitive testing negative for neurologic abnormality.  She continue to refuse psychiatry visit.

## 2022-06-23 NOTE — Progress Notes (Signed)
Patient ID: Katie Zimmerman, female    DOB: [birth year], 77 y.o.   MRN: [id number]  This visit was conducted in person.  BP 120/76   Pulse 96   Temp 98 F (36.7 C)   Resp 16   Ht 4' 9.5" (1.461 m)   Wt 118 lb 8 oz (53.8 kg)   SpO2 97%   BMI 25.20 kg/m    CC:  Chief Complaint  Patient presents with   Medicare Wellness    Subjective:   HPI: Katie Zimmerman is a 77 y.o. female presenting on 06/23/2022 for Medicare Wellness  The patient presents for annual medicare wellness complete physical and review of chronic health problems. She also has the following acute concerns today:  I have personally reviewed the Medicare Annual Wellness questionnaire and have noted 1. The patient's medical and social history 2. Their use of alcohol, tobacco or illicit drugs 3. Their current medications and supplements 4. The patient's functional ability including ADL's, fall risks, home safety risks and hearing or visual             impairment. 5. Diet and physical activities 6. Evidence for depression or mood disorders 7.         Updated provider list Cognitive evaluation was performed and recorded on pt medicare questionnaire form. The patients weight, height, BMI and visual acuity have been recorded in the chart   I have made referrals, counseling and provided education to the patient based review of the above and I have provided the pt with a written personalized care plan for preventive services.   Documentation of this information was scanned into the electronic record under the media tab.   Advance directives and end of life planning reviewed in detail with patient and documented in EMR. Patient given handout on advance care directives if needed. HCPOA and living will updated if needed.  No falls in last 12 months.  Hearing Screening   '1000Hz'$  '2000Hz'$  '4000Hz'$  '5000Hz'$   Right ear      Left ear 40 40 40 40  Vision Screening - Comments:: Seen Dr. Satira Sark last year   [place] Visit from  06/23/2022 in [place] at [facility] Total Score 0       Reviewed recent neurocognitive testing, Dr. Melvyn Novas.  Final results showed neuro psychological functioning largely within normal limits from the [place] to age-matched peers.  She was able to learn novel verbal and visual information efficiently and retain this knowledge after lengthy device.  Memory performance was intact as well as cognitive functioning not suggestive of Alzheimer's disease.   No clear neurologic reason for delusion.  Given normal neurocognitive functioning and benign findings across recent neuroimaging, the source of Katie Zimmerman's significant delusional thinking appears far more likely to be psychiatric rather than neurological.   She has been referred to psychiatry and given contact information for several locations in the past She continues to have delusional thinking surrounding her perception of regular correspondence with Katie Zimmerman.  She is at the office visit today with her sister.  Reviewed labs in detail.  Prediabetes  Lab Results  Component Value Date   HGBA1C 5.7 06/17/2022   Elevated Cholesterol: Lab Results  Component Value Date   CHOL 224 (H) 06/17/2022   HDL 69.80 06/17/2022   LDLCALC 127 (H) 06/17/2022   LDLDIRECT 102.0 02/20/2018   TRIG 135.0 06/17/2022   CHOLHDL 3 06/17/2022  Using medications without problems: Muscle aches:  Diet compliance: heart healthy diet Exercise: minimal, plans membership  Other complaints:  She has been doing [employer]. Body mass index is 25.2 kg/m.  Wt Readings from Last 3 Encounters:  06/23/22 118 lb 8 oz (53.8 kg)  04/07/22 127 lb (57.6 kg)  03/16/22 126 lb 3.2 oz (57.2 kg)   The 10-year ASCVD risk score (Arnett DK, et al., 2019) is: 28.9%   Values used to calculate the score:     Age: 71 years     Sex: Female     Is Non-Hispanic African American: No     Diabetic: Yes     Tobacco smoker: No     Systolic  Blood Pressure: 120 mmHg     Is BP treated: No     HDL Cholesterol: 69.8 mg/dL     Total Cholesterol: 224 mg/dL        Patient Care Team: [name], MD as PCP - General Josue [place], MD as PCP - Cardiology (Cardiology) Cameron Sprang, MD as Consulting Physician (Neurology) [name], [facility] as Pharmacist (Pharmacist) Cameron Sprang, MD as Consulting Physician (Neurology)   Relevant past medical, surgical, family and social history reviewed and updated as indicated. Interim medical history since our last visit reviewed. Allergies and medications reviewed and updated. Outpatient Medications Prior to Visit  Medication Sig Dispense Refill   acetaminophen (TYLENOL) 500 MG tablet Take 1,000 mg by mouth every 4 (four) hours as needed for mild pain, moderate pain or headache.      albuterol (VENTOLIN HFA) 108 (90 Base) MCG/ACT inhaler INHALE 2 PUFFS BY MOUTH EVERY 6 HOURS AS NEEDED FOR WHEEZING FOR SHORTNESS OF BREATH 9 g 5   benzonatate (TESSALON) 200 MG capsule Take 1 capsule (200 mg total) by mouth 3 (three) times daily as needed for cough. 20 capsule 0   Cholecalciferol (VITAMIN D) 50 MCG (2000 UT) tablet Take 2,000 Units by mouth daily.     diclofenac Sodium (VOLTAREN) 1 % GEL Apply 1 application topically 2 (two) times daily as needed (pain).     DULoxetine (CYMBALTA) 30 MG capsule Take 1 tablet every night 90 capsule 3   gabapentin (NEURONTIN) 600 MG tablet Take 1 tablet three times a day 270 tablet 3   ipratropium-albuterol (DUONEB) 0.5-2.5 (3) MG/3ML SOLN Take 3 mLs by nebulization every 6 (six) hours as needed. 150 mL 12   Multiple Vitamins-Minerals (GNP HAIR/SKIN/NAILS PO) Take 3 tablets by mouth daily.     nortriptyline (PAMELOR) 50 MG capsule Take 3 capsules every night 270 capsule 3   ondansetron (ZOFRAN) 4 MG tablet Take 4 mg by mouth every 8 (eight) hours as needed.     Potassium Citrate 15 MEQ (1620 MG) TBCR Take 2 tablets by mouth in the morning and at bedtime.  120 tablet 11   SF 5000 PLUS 1.1 % CREA dental cream Place 1 application  onto teeth at bedtime.     traMADol (ULTRAM) 50 MG tablet TAKE 1/2 TO 1 (ONE-HALF TO ONE) TABLET BY MOUTH ONCE DAILY AS NEEDED FOR SEVERE PAIN 30 tablet 5   TRELEGY ELLIPTA 100-62.5-25 MCG/ACT AEPB Inhale 1 puff into the lungs daily.     vitamin B-12 (CYANOCOBALAMIN) 1000 MCG tablet Take 1,000 mcg by mouth daily.     azithromycin (ZITHROMAX) 250 MG tablet Take 1 tablet (250 mg total) by mouth as directed. 6 tablet 0   No facility-administered medications prior to visit.     Per HPI unless specifically indicated  in ROS section below Review of Systems  Constitutional:  Negative for fatigue and fever.  HENT:  Negative for congestion.   Eyes:  Negative for pain.  Respiratory:  Negative for cough and shortness of breath.   Cardiovascular:  Negative for chest pain, palpitations and leg swelling.  Gastrointestinal:  Negative for abdominal pain.  Genitourinary:  Negative for dysuria and vaginal bleeding.  Musculoskeletal:  Negative for back pain.  Neurological:  Negative for syncope, light-headedness and headaches.  Psychiatric/Behavioral:  Negative for dysphoric mood.     Objective:  BP 120/76   Pulse 96   Temp 98 F (36.7 C)   Resp 16   Ht 4' 9.5" (1.461 m)   Wt 118 lb 8 oz (53.8 kg)   SpO2 97%   BMI 25.20 kg/m   Wt Readings from Last 3 Encounters:  06/23/22 118 lb 8 oz (53.8 kg)  04/07/22 127 lb (57.6 kg)  03/16/22 126 lb 3.2 oz (57.2 kg)      Physical Exam Vitals and nursing note reviewed.  Constitutional:      General: She is not in acute distress.    Appearance: Normal appearance. She is well-developed. She is not ill-appearing or toxic-appearing.  HENT:     Head: Normocephalic.     Right Ear: Hearing, tympanic membrane, ear canal and external ear normal.     Left Ear: Hearing, tympanic membrane, ear canal and external ear normal.     Nose: Nose normal.  Eyes:     General: Lids are normal. Lids  are everted, no foreign bodies appreciated.     Conjunctiva/sclera: Conjunctivae normal.     Pupils: Pupils are equal, round, and reactive to light.  Neck:     Thyroid: No thyroid mass or thyromegaly.     Vascular: No carotid bruit.     Trachea: Trachea normal.  Cardiovascular:     Rate and Rhythm: Normal rate and regular rhythm.     Heart sounds: Normal heart sounds, S1 normal and S2 normal. No murmur heard.    No gallop.  Pulmonary:     Effort: Pulmonary effort is normal. No respiratory distress.     Breath sounds: Normal breath sounds. No wheezing, rhonchi or rales.  Abdominal:     General: Bowel sounds are normal. There is no distension or abdominal bruit.     Palpations: Abdomen is soft. There is no fluid wave or mass.     Tenderness: There is no abdominal tenderness. There is no guarding or rebound.     Hernia: No hernia is present.  Musculoskeletal:     Cervical back: Normal range of motion and neck supple.  Lymphadenopathy:     Cervical: No cervical adenopathy.  Skin:    General: Skin is warm and dry.     Findings: No rash.  Neurological:     Mental Status: She is alert.     Cranial Nerves: No cranial nerve deficit.     Sensory: No sensory deficit.  Psychiatric:        Mood and Affect: Mood is not anxious or depressed.        Speech: Speech normal.        Behavior: Behavior normal. Behavior is cooperative.        Judgment: Judgment normal.       Results for orders placed or performed in visit on 06/17/22  Comprehensive metabolic panel  Result Value Ref Range   Sodium 143 135 - 145 mEq/L   Potassium 3.9  3.5 - 5.1 mEq/L   Chloride 104 96 - 112 mEq/L   CO2 30 19 - 32 mEq/L   Glucose, Bld 112 (H) 70 - 99 mg/dL   BUN 10 6 - 23 mg/dL   Creatinine, Ser 0.81 0.40 - 1.20 mg/dL   Total Bilirubin 0.4 0.2 - 1.2 mg/dL   Alkaline Phosphatase 92 39 - 117 U/L   AST 16 0 - 37 U/L   ALT 12 0 - 35 U/L   Total Protein 7.4 6.0 - 8.3 g/dL   Albumin 4.4 3.5 - 5.2 g/dL   GFR  70.49 >60.00 mL/min   Calcium 10.0 8.4 - 10.5 mg/dL  Lipid panel  Result Value Ref Range   Cholesterol 224 (H) 0 - 200 mg/dL   Triglycerides 135.0 0.0 - 149.0 mg/dL   HDL 69.80 >39.00 mg/dL   VLDL 27.0 0.0 - 40.0 mg/dL   LDL Cholesterol 127 (H) 0 - 99 mg/dL   Total CHOL/HDL Ratio 3    NonHDL 154.38   Hemoglobin A1c  Result Value Ref Range   Hgb A1c MFr Bld 5.7 4.6 - 6.5 %    Assessment and Plan The patient's preventative maintenance and recommended screening tests for an annual wellness exam were reviewed in full today. Brought up to date unless services declined.  Counselled on the importance of diet, exercise, and its role in overall health and mortality. The patient's FH and SH was reviewed, including their home life, tobacco status, and drug and alcohol status.    Vaccines: Uptodate with pneumovax and prevnar, consider TDAP, shingles.  Refused flu shot.  04/2012 Dr. Carlean Purl, colonoscopy 5 adenomas max 6 mm overdue  .Marland Kitchen Given > age 34 she will consider this but not really interested as she is asymptomatic. DVE/PAP: not indicated Mammo: Breast cancer s/p  Lumpectomy on right, radiation, followed by Dr. Lindi Adie, last mammo 08/2021 DEXA:09/2016; osteopenia, on tamoxifen (off NOV 2018), fosamax not needed. Repeat in 2 years.  Worsened back in osteoporosis range 2021.. due repeat in 2023  Non smoker, sig second hand some. Hearing loss in right ear. Dr. Warren Danes.  Hep C: neg   Medicare annual wellness visit, subsequent  Delusional thoughts [facility]) Assessment & Plan: Chronic... she continues to remain entrenched in her delusional thoughts. Continue to be  preyed upon by a scammer financially.  Neurocongitive testing negative for neurologic abnormality.  She continue to refuse psychiatry visit.   Prediabetes Assessment & Plan: Chronic, stable with diet control.    High cholesterol Assessment & Plan: LDL at goal but she is at increased risk for cardiovascular disease.  Statin  medication is indicated ... She refuses at this time.  Info provided about low cholesterol diet. She will consider zetia as an in option.  The 10-year ASCVD risk score (Arnett DK, et al., 2019) is: 34.5%   Values used to calculate the score:     Age: 70 years     Sex: Female     Is Non-Hispanic African American: No     Diabetic: Yes     Tobacco smoker: No     Systolic Blood Pressure: 480 mmHg     Is BP treated: No     HDL Cholesterol: 69.8 mg/dL     Total Cholesterol: 224 mg/dL    Class 2 severe obesity due to excess calories with serious comorbidity and body mass index (BMI) of 36.0 to 36.9 in adult [facility]) Assessment & Plan: Encouraged exercise, weight loss, healthy eating habits.    Mass  of left side of neck Assessment & Plan: US unremarkable, no current lesion.   Osteoporosis, unspecified osteoporosis type, unspecified pathological fracture presence -     DG Bone Density; Future     Medicare annual wellness visit, subsequent  Delusional thoughts ([place])  Prediabetes  High cholesterol Assessment & Plan: LDL at goal but she is at increased risk for cardiovascular disease.  Statin medication is indicated .  The 10-year ASCVD risk score (Arnett DK, et al., 2019) is: 34.5%   Values used to calculate the score:     Age: 34 years     Sex: Female     Is Non-Hispanic African American: No     Diabetic: Yes     Tobacco smoker: No     Systolic Blood Pressure: 144 mmHg     Is BP treated: No     HDL Cholesterol: 69.8 mg/dL     Total Cholesterol: 224 mg/dL    Class 2 severe obesity due to excess calories with serious comorbidity and body mass index (BMI) of 36.0 to 36.9 in adult [facility]) Assessment & Plan: Encouraged exercise, weight loss, healthy eating habits.       No follow-ups on file.   Eliezer Lofts, MD

## 2022-06-23 NOTE — Assessment & Plan Note (Signed)
Encouraged exercise, weight loss, healthy eating habits. ? ?

## 2022-06-23 NOTE — Assessment & Plan Note (Signed)
Chronic, stable with diet control.

## 2022-06-23 NOTE — Assessment & Plan Note (Signed)
US unremarkable, no current lesion.

## 2022-06-23 NOTE — Assessment & Plan Note (Addendum)
LDL at goal but she is at increased risk for cardiovascular disease.  Statin medication is indicated ... She refuses at this time.  Info provided about low cholesterol diet. She will consider zetia as an in option.  The 10-year ASCVD risk score (Arnett DK, et al., 2019) is: 34.5%   Values used to calculate the score:     Age: 77 years     Sex: Female     Is Non-Hispanic African American: No     Diabetic: Yes     Tobacco smoker: No     Systolic Blood Pressure: 606 mmHg     Is BP treated: No     HDL Cholesterol: 69.8 mg/dL     Total Cholesterol: 224 mg/dL

## 2022-08-08 ENCOUNTER — Other Ambulatory Visit: Payer: Self-pay | Admitting: Family Medicine

## 2022-08-08 DIAGNOSIS — Z1231 Encounter for screening mammogram for malignant neoplasm of breast: Secondary | ICD-10-CM

## 2022-09-01 ENCOUNTER — Encounter: Payer: Medicare Other | Admitting: Psychology

## 2022-09-21 ENCOUNTER — Ambulatory Visit: Payer: Medicare Other

## 2022-09-27 ENCOUNTER — Ambulatory Visit: Payer: Medicare Other

## 2022-09-28 ENCOUNTER — Encounter: Payer: Medicare Other | Admitting: Psychology

## 2022-10-06 ENCOUNTER — Encounter: Payer: Medicare Other | Admitting: Psychology

## 2022-10-28 ENCOUNTER — Ambulatory Visit: Payer: Medicare Other

## 2022-11-16 NOTE — Progress Notes (Deleted)
-HPI  female never smoker(prolonged second hand smoke) followed for chronic bronchitis, complicated by right breast cancer/XRT, GERD, Postherpetic Neuralgia,  Office Spirometry 10/16/2017-moderate restriction of exhaled volume without obvious obstruction.  FVC 1.57/68%, FEV1 1.27/73%, ratio 0.81, FEF 25-75% 1.37/88% Echocardiogram EF 65-70, grade 1 diastolic dysfunction PFT 12/18/2017-mild restriction, minimal diffusion defect.  FVC 1.72/71%, FEV1 1.53/85%, ratio 0.89, TLC 69%, DLCO 74% Labs 11/17/2017-IgE 241 H, Eos  0.5, BNP 69 FENO 06/20/2018-- 11 WNL --------------------------------------------------------------   11/16/21- -77 year old female never smoker(prolonged second hand smoke) followed for chronic bronchitis/ COPD, complicated by right Breast Cancer/XRT, GERD , Postherpetic Neuralgia, Obesity, Kidney Stone,  -Trelegy 100, Ventolin hfa, neb Duoneb, Tessalon,  Covid vax-3 Phizer Flu vax-had -----Follow up.  Still has chronic cough but was unable to collect a sputum specimen.  Breathing feels stable.  Trelegy too expensive and we discussed price comparison with [employer] before looking at other options.  Incidental headache today. Discussed her work-up for kidney stone. CXR 07/06/21- IMPRESSION: No acute abnormalities. 11 x 5 mm LEFT renal calculus with LEFT ureteral stent noted. Aortic Atherosclerosis (ICD10-I70.0).  11/17/22- 77 year old female never smoker(prolonged second hand smoke) followed for chronic bronchitis/ COPD, complicated by right Breast Cancer/XRT, GERD , Postherpetic Neuralgia, Obesity, Kidney Stone,  -Trelegy 100, Ventolin hfa, neb Duoneb, Tessalon,      ROS-see HPI  + = positive Constitutional:   No-   weight loss, night sweats, fevers, chills, fatigue, lassitude. HEENT:   +headaches, difficulty swallowing, tooth/dental problems, sore throat,       No-  sneezing, itching, ear ache, nasal congestion, post nasal drip,  CV:  No-   chest pain, orthopnea, PND,  swelling in lower extremities, anasarca,                                      dizziness, palpitations Resp: +shortness of breath with exertion or at rest.             +productive cough,  + non-productive cough,  No- coughing up of blood.            +change in color of mucus.   +wheezing.   Skin: No-   rash or lesions. GI:  No-   heartburn, indigestion, abdominal pain, nausea, vomiting, diarrhea,                 change in bowel habits, loss of appetite GU:  MS:  No-   joint pain or swelling.   Neuro-    + Falls, + postherpetic neuralgia Psych:  No- change in mood or affect. No depression or anxiety.  No memory loss.  OBJ- Physical Exam General- Alert, Oriented, Affect-appropriate, Distress- none acute, + obese Skin- rash-none, lesions- none, excoriation- none Lymphadenopathy- none Head- atraumatic            Eyes- Gross vision intact, PERRLA, conjunctivae and secretions clear            Ears- Hearing, canals-normal            Nose- Clear, no-Septal dev, mucus, polyps, erosion, perforation             Throat- Mallampati III , mucosa clear , drainage- none, tonsils- atrophic Neck- flexible , trachea midline, no stridor , thyroid nl, carotid no bruit Chest - symmetrical excursion , unlabored           Heart/CV- RRR , no murmur , no gallop  , no  rub, nl s1 s2                           - JVD- none , edema- none, stasis changes- none, varices- none           Lung- + clear, wheeze-none, cough+barking(?tracheomalacia) , dullness-none, rub- none           Chest wall-  +Hx R lumpectomy/XRT Abd-  Br/ Gen/ Rectal- Not done, not indicated Extrem- cyanosis- none, clubbing, none, atrophy- none, strength- nl Neuro- grossly intact to observation

## 2022-11-17 ENCOUNTER — Ambulatory Visit: Payer: Medicare Other | Admitting: Internal Medicine

## 2022-11-18 ENCOUNTER — Ambulatory Visit: Payer: Medicare Other

## 2022-11-29 DIAGNOSIS — H00014 Hordeolum externum left upper eyelid: Secondary | ICD-10-CM | POA: Diagnosis not present

## 2022-12-09 ENCOUNTER — Ambulatory Visit: Payer: Medicare Other

## 2022-12-16 ENCOUNTER — Ambulatory Visit: Payer: Medicare Other

## 2022-12-27 NOTE — Progress Notes (Deleted)
-HPI  female never smoker(prolonged second hand smoke) followed for chronic bronchitis, complicated by right breast cancer/XRT, GERD, Postherpetic Neuralgia,  Office Spirometry 10/16/2017-moderate restriction of exhaled volume without obvious obstruction.  FVC 1.57/68%, FEV1 1.27/73%, ratio 0.81, FEF 25-75% 1.37/88% Echocardiogram EF 65-70, grade 1 diastolic dysfunction PFT 12/18/2017-mild restriction, minimal diffusion defect.  FVC 1.72/71%, FEV1 1.53/85%, ratio 0.89, TLC 69%, DLCO 74% Labs 11/17/2017-IgE 241 H, Eos  0.5, BNP 69 FENO 06/20/2018-- 11 WNL --------------------------------------------------------------   11/16/21- -77 year old female never smoker(prolonged second hand smoke) followed for chronic bronchitis/ COPD, complicated by right Breast Cancer/XRT, GERD , Postherpetic Neuralgia, Obesity, Kidney Stone,  -Trelegy 100, Ventolin hfa, neb Duoneb, Tessalon,  Covid vax-3 Phizer Flu vax-had -----Follow up.  Still has chronic cough but was unable to collect a sputum specimen.  Breathing feels stable.  Trelegy too expensive and we discussed price comparison with [employer] before looking at other options.  Incidental headache today. Discussed her work-up for kidney stone. CXR 07/06/21- IMPRESSION: No acute abnormalities. 11 x 5 mm LEFT renal calculus with LEFT ureteral stent noted. Aortic Atherosclerosis (ICD10-I70.0).  12/29/22-77 year old female never smoker(prolonged second hand smoke) followed for chronic bronchitis/ COPD, complicated by right Breast Cancer/XRT, GERD , Postherpetic Neuralgia, Obesity, Kidney Stone,  -Trelegy 100, Ventolin hfa, neb Duoneb, Tessalon,      ROS-see HPI  + = positive Constitutional:   No-   weight loss, night sweats, fevers, chills, fatigue, lassitude. HEENT:   +headaches, difficulty swallowing, tooth/dental problems, sore throat,       No-  sneezing, itching, ear ache, nasal congestion, post nasal drip,  CV:  No-   chest pain, orthopnea, PND, swelling  in lower extremities, anasarca,                                      dizziness, palpitations Resp: +shortness of breath with exertion or at rest.             +productive cough,  + non-productive cough,  No- coughing up of blood.            +change in color of mucus.   +wheezing.   Skin: No-   rash or lesions. GI:  No-   heartburn, indigestion, abdominal pain, nausea, vomiting, diarrhea,                 change in bowel habits, loss of appetite GU:  MS:  No-   joint pain or swelling.   Neuro-    + Falls, + postherpetic neuralgia Psych:  No- change in mood or affect. No depression or anxiety.  No memory loss.  OBJ- Physical Exam General- Alert, Oriented, Affect-appropriate, Distress- none acute, + obese Skin- rash-none, lesions- none, excoriation- none Lymphadenopathy- none Head- atraumatic            Eyes- Gross vision intact, PERRLA, conjunctivae and secretions clear            Ears- Hearing, canals-normal            Nose- Clear, no-Septal dev, mucus, polyps, erosion, perforation             Throat- Mallampati III , mucosa clear , drainage- none, tonsils- atrophic Neck- flexible , trachea midline, no stridor , thyroid nl, carotid no bruit Chest - symmetrical excursion , unlabored           Heart/CV- RRR , no murmur , no gallop  , no rub,  nl s1 s2                           - JVD- none , edema- none, stasis changes- none, varices- none           Lung- + clear, wheeze-none, cough+barking(?tracheomalacia) , dullness-none, rub- none           Chest wall-  +Hx R lumpectomy/XRT Abd-  Br/ Gen/ Rectal- Not done, not indicated Extrem- cyanosis- none, clubbing, none, atrophy- none, strength- nl Neuro- grossly intact to observation

## 2022-12-28 ENCOUNTER — Ambulatory Visit: Payer: Medicare Other

## 2022-12-29 ENCOUNTER — Ambulatory Visit: Payer: Medicare Other | Admitting: Internal Medicine

## 2023-01-05 ENCOUNTER — Telehealth: Payer: Self-pay

## 2023-01-05 ENCOUNTER — Ambulatory Visit: Payer: Medicare PPO | Admitting: Urology

## 2023-01-05 NOTE — Patient Outreach (Signed)
  Care Coordination   01/05/2023 Name: Katie Zimmerman MRN: [id number] DOB: 02/28/1946   Care Coordination Outreach Attempts:  An unsuccessful telephone outreach was attempted today to offer the patient information about available care coordination services. HIPAA compliant message left.  Follow Up Plan:  Additional outreach attempts will be made to offer the patient care coordination information and services.   Encounter Outcome:  No Answer   Care Coordination Interventions:  No, not indicated    George Ina [facility] Care Coordination [phone] direct line

## 2023-01-09 ENCOUNTER — Telehealth: Payer: Self-pay

## 2023-01-09 NOTE — Patient Outreach (Signed)
  Care Coordination   Initial Visit Note   01/09/2023 Name: Katie Zimmerman MRN: [id number] DOB: 07-04-1945  Katie Zimmerman is a 77 y.o. year old female who sees Excell Seltzer, MD for primary care. I spoke with  Katie Zimmerman by phone today.  What matters to the patients health and wellness today?  Patient inquired if she had a physical coming up with her primary care provider.  Patient states she had forgotten about her mammogram scheduled for 01/10/23.   Patient denies having any nursing and/ or community resource needs at this time. She states she is happy to know about the care coordination services and that she has access to them if she needs it.    Goals Addressed             This Visit's Progress    COMPLETED: Care coordination activities - no follow up needed.       Interventions Today    Flowsheet Row Most Recent Value  General Interventions   General Interventions Discussed/Reviewed General Interventions Discussed, Doctor Visits  [Care coordination services discussed. SDOH survey completed. AWV discussed and patient advised to contact provider office to schedule. Discussed vaccines. Advised to contact PCP office if care coordination services needed in the future.]  Doctor Visits Discussed/Reviewed Doctor Visits Discussed  [reviewed upcoming provider visits. Confirmed patient aware she has a mammogram appointment scheduled for 01/10/23]                SDOH assessments and interventions completed:  Yes  SDOH Interventions Today    Flowsheet Row Most Recent Value  SDOH Interventions   Food Insecurity Interventions Intervention Not Indicated  Transportation Interventions Intervention Not Indicated        Care Coordination Interventions:  Yes, provided   Follow up plan: No further intervention required.   Encounter Outcome:  Pt. Visit Completed   George Ina RN,BSN,CCM [facility] Care Coordination [phone] direct line

## 2023-01-10 ENCOUNTER — Ambulatory Visit: Payer: Medicare Other

## 2023-01-18 ENCOUNTER — Ambulatory Visit: Payer: Medicare Other

## 2023-01-24 DIAGNOSIS — H5203 Hypermetropia, bilateral: Secondary | ICD-10-CM | POA: Diagnosis not present

## 2023-01-24 DIAGNOSIS — H2513 Age-related nuclear cataract, bilateral: Secondary | ICD-10-CM | POA: Diagnosis not present

## 2023-01-24 DIAGNOSIS — H02403 Unspecified ptosis of bilateral eyelids: Secondary | ICD-10-CM | POA: Diagnosis not present

## 2023-01-24 DIAGNOSIS — H25013 Cortical age-related cataract, bilateral: Secondary | ICD-10-CM | POA: Diagnosis not present

## 2023-01-24 DIAGNOSIS — H40033 Anatomical narrow angle, bilateral: Secondary | ICD-10-CM | POA: Diagnosis not present

## 2023-01-24 DIAGNOSIS — H524 Presbyopia: Secondary | ICD-10-CM | POA: Diagnosis not present

## 2023-01-24 DIAGNOSIS — H16011 Central corneal ulcer, right eye: Secondary | ICD-10-CM | POA: Diagnosis not present

## 2023-01-25 DIAGNOSIS — H16011 Central corneal ulcer, right eye: Secondary | ICD-10-CM | POA: Diagnosis not present

## 2023-01-27 DIAGNOSIS — H16011 Central corneal ulcer, right eye: Secondary | ICD-10-CM | POA: Diagnosis not present

## 2023-01-30 IMAGING — MG MM DIGITAL SCREENING BILAT W/ TOMO AND CAD
6 of 10 series · 6 of 30 positions shown · non-contrast
Comparison: Previous exam(s).

CLINICAL DATA: Screening.

EXAM:
DIGITAL SCREENING BILATERAL MAMMOGRAM WITH TOMOSYNTHESIS AND CAD
TECHNIQUE: Bilateral screening digital craniocaudal and mediolateral oblique
mammograms were obtained. Bilateral screening digital breast
tomosynthesis was performed. The images were evaluated with
computer-aided detection.

[L MLO synth-2D]
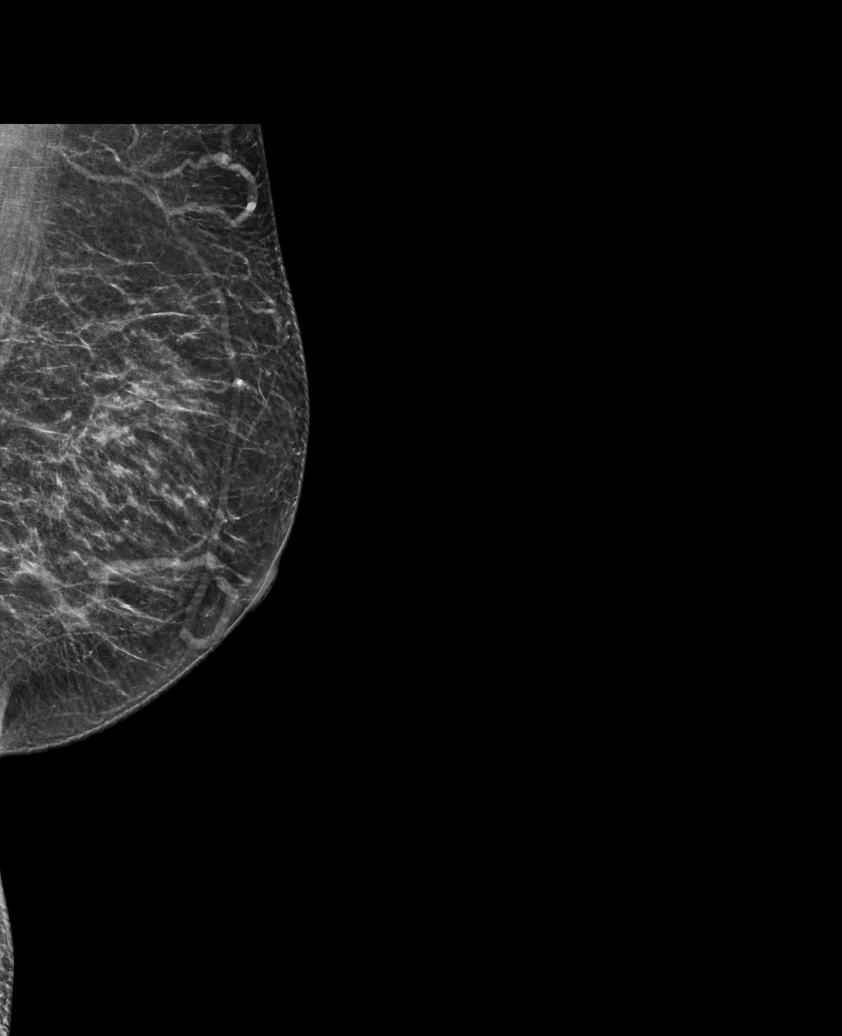

[L CC synth-2D]
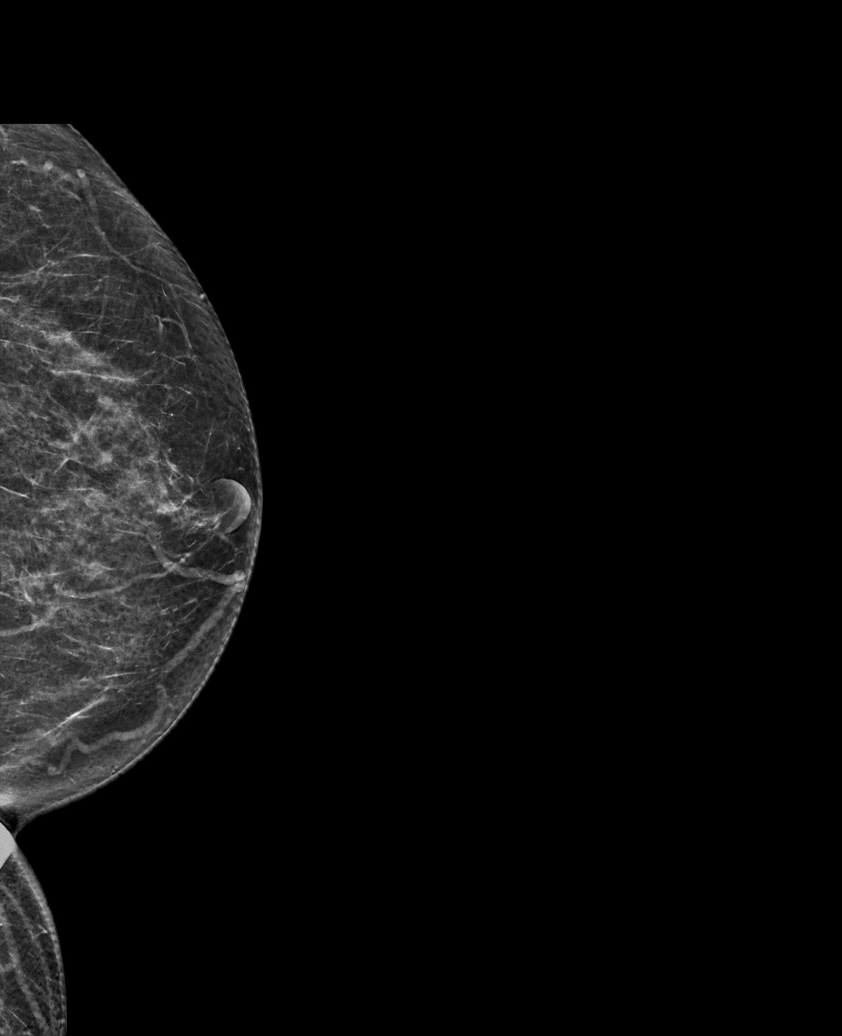

[R CC synth-2D (1 of 2)]
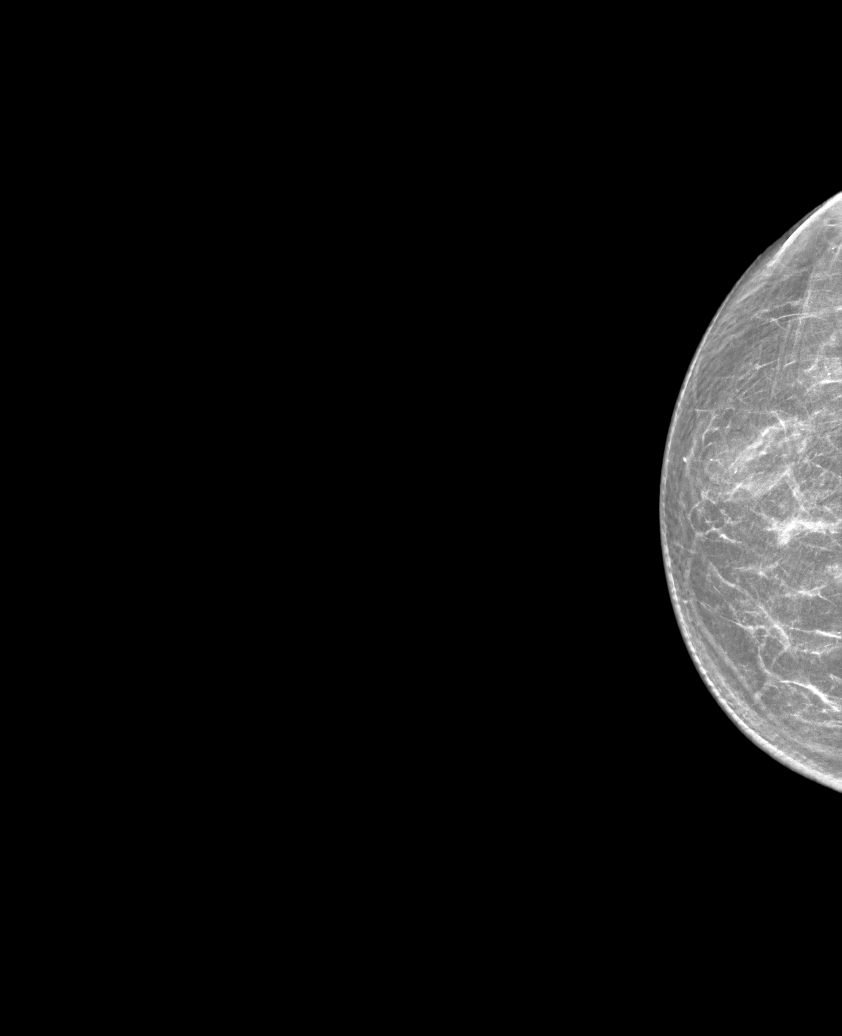

[R CC synth-2D (2 of 2)]
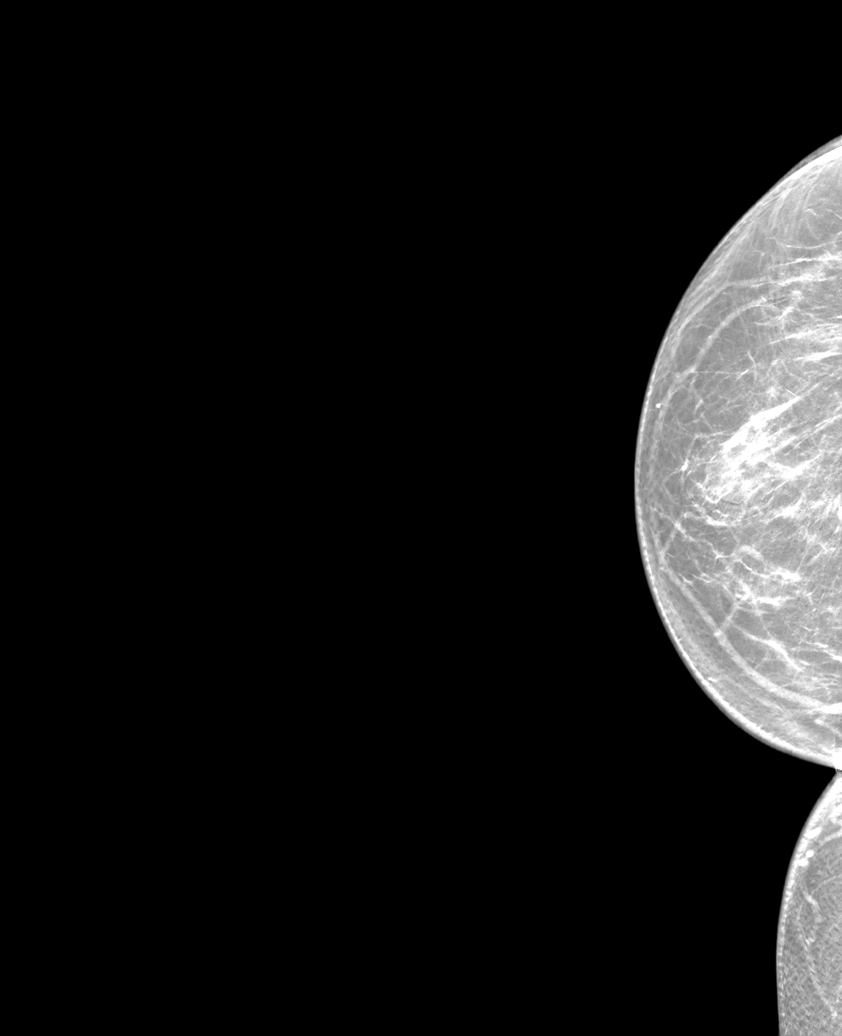

[R MLO synth-2D]
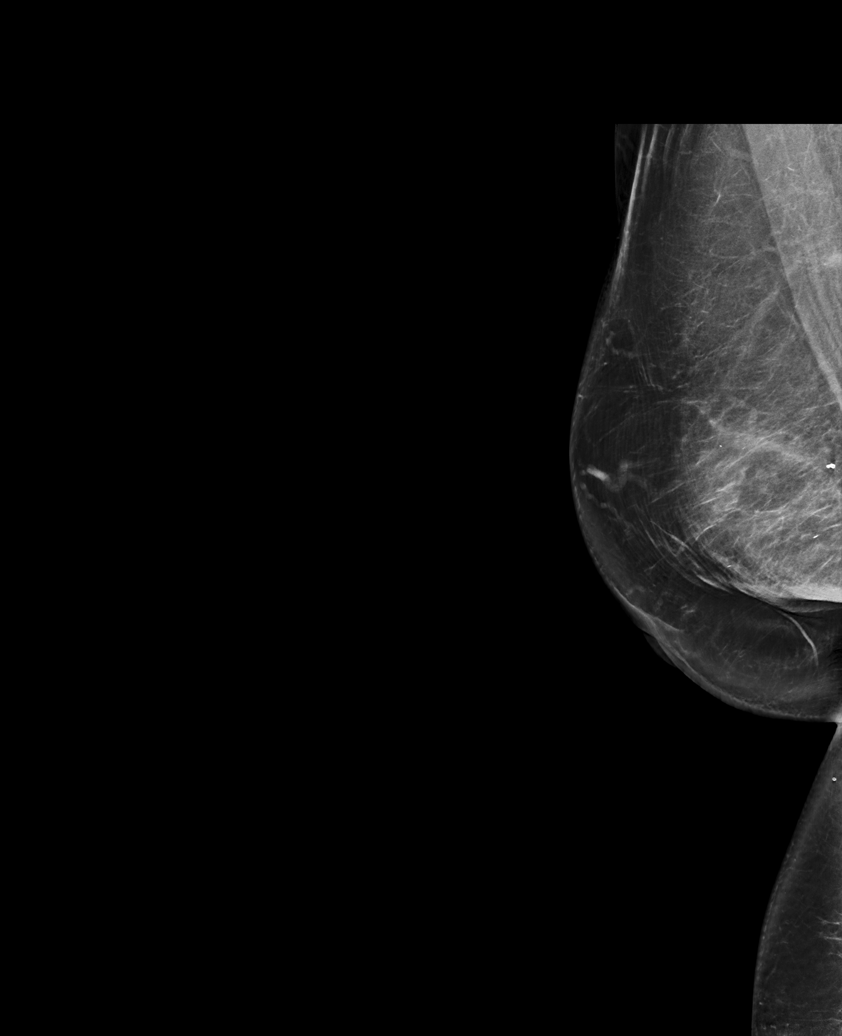

[R CC tomo · tomo slice 31/62.0]
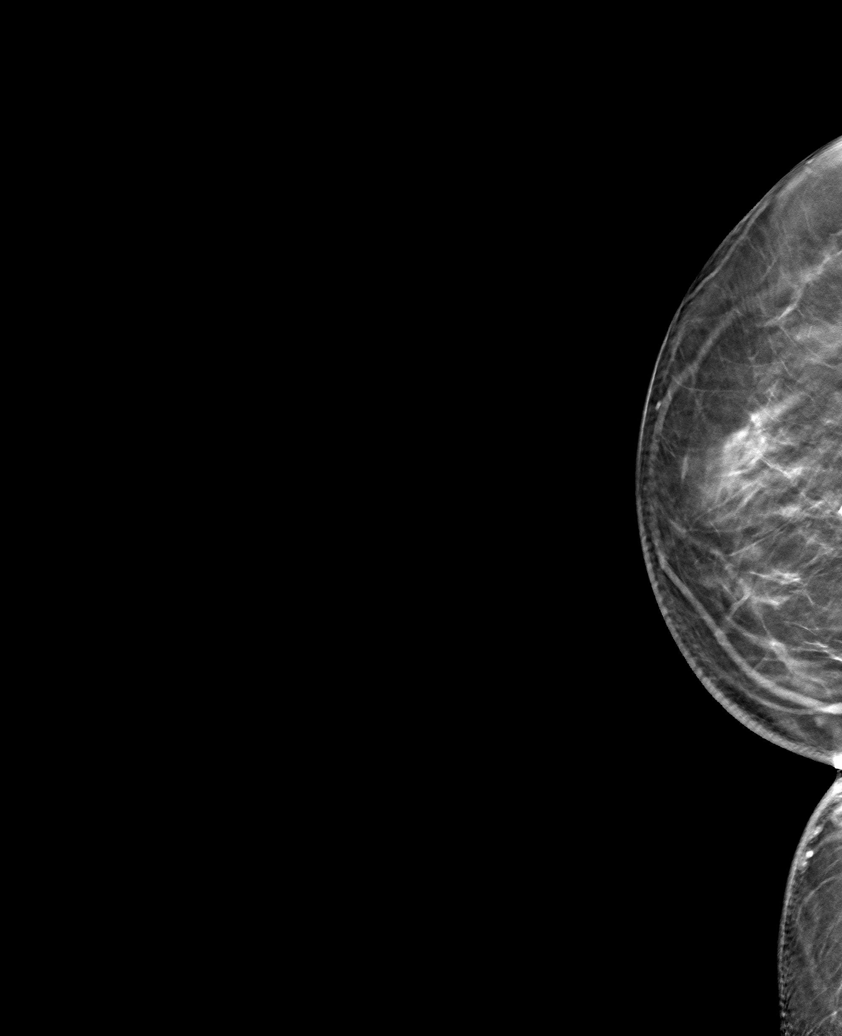

[6 of 30 positions shown; findings below may reference images not displayed]

ACR Breast Density Category b: There are scattered areas of
fibroglandular density.
FINDINGS: There are no findings suspicious for malignancy.
IMPRESSION: No mammographic evidence of malignancy. A result letter of this
screening mammogram will be mailed directly to the patient.

RECOMMENDATION:
Screening mammogram in one year. (Code:51-O-LD2)

BI-RADS CATEGORY  1: Negative.

## 2023-01-31 DIAGNOSIS — H16011 Central corneal ulcer, right eye: Secondary | ICD-10-CM | POA: Diagnosis not present

## 2023-02-03 ENCOUNTER — Ambulatory Visit: Payer: Medicare Other

## 2023-02-08 ENCOUNTER — Encounter: Payer: Medicare Other | Admitting: Psychology

## 2023-02-08 DIAGNOSIS — H2513 Age-related nuclear cataract, bilateral: Secondary | ICD-10-CM | POA: Diagnosis not present

## 2023-02-08 DIAGNOSIS — H1789 Other corneal scars and opacities: Secondary | ICD-10-CM | POA: Diagnosis not present

## 2023-02-12 NOTE — Progress Notes (Deleted)
-HPI  female never smoker(prolonged second hand smoke) followed for chronic bronchitis, complicated by right breast cancer/XRT, GERD, Postherpetic Neuralgia,  Office Spirometry 10/16/2017-moderate restriction of exhaled volume without obvious obstruction.  FVC 1.57/68%, FEV1 1.27/73%, ratio 0.81, FEF 25-75% 1.37/88% Echocardiogram EF 65-70, grade 1 diastolic dysfunction PFT 12/18/2017-mild restriction, minimal diffusion defect.  FVC 1.72/71%, FEV1 1.53/85%, ratio 0.89, TLC 69%, DLCO 74% Labs 11/17/2017-IgE 241 H, Eos  0.5, BNP 69 FENO 06/20/2018-- 11 WNL -------------------------------------------------------------- 11/16/21- -77 year old female never smoker(prolonged second hand smoke) followed for chronic bronchitis/ COPD, complicated by right Breast Cancer/XRT, GERD , Postherpetic Neuralgia, Obesity, Kidney Stone,  -Trelegy 100, Ventolin hfa, neb Duoneb, Tessalon,  Covid vax-3 Phizer Flu vax-had -----Follow up.  Still has chronic cough but was unable to collect a sputum specimen.  Breathing feels stable.  Trelegy too expensive and we discussed price comparison with [employer] before looking at other options.  Incidental headache today. Discussed her work-up for kidney stone. CXR 07/06/21- IMPRESSION: No acute abnormalities. 11 x 5 mm LEFT renal calculus with LEFT ureteral stent noted. Aortic Atherosclerosis (ICD10-I70.0).   02/14/23-77 year old female never smoker(prolonged second hand smoke) followed for chronic bronchitis/ COPD, complicated by right Breast Cancer/XRT, GERD , Postherpetic Neuralgia, Obesity, Kidney Stone, Delusional Disorder,  -Trelegy 100, Ventolin hfa, neb Duoneb, Tessalon,       ROS-see HPI  + = positive Constitutional:   No-   weight loss, night sweats, fevers, chills, fatigue, lassitude. HEENT:   +headaches, difficulty swallowing, tooth/dental problems, sore throat,       No-  sneezing, itching, ear ache, nasal congestion, post nasal drip,  CV:  No-   chest pain,  orthopnea, PND, swelling in lower extremities, anasarca,                                      dizziness, palpitations Resp: +shortness of breath with exertion or at rest.             +productive cough,  + non-productive cough,  No- coughing up of blood.            +change in color of mucus.   +wheezing.   Skin: No-   rash or lesions. GI:  No-   heartburn, indigestion, abdominal pain, nausea, vomiting, diarrhea,                 change in bowel habits, loss of appetite GU:  MS:  No-   joint pain or swelling.   Neuro-    + Falls, + postherpetic neuralgia Psych:  No- change in mood or affect. No depression or anxiety.  No memory loss.  OBJ- Physical Exam General- Alert, Oriented, Affect-appropriate, Distress- none acute, + obese Skin- rash-none, lesions- none, excoriation- none Lymphadenopathy- none Head- atraumatic            Eyes- Gross vision intact, PERRLA, conjunctivae and secretions clear            Ears- Hearing, canals-normal            Nose- Clear, no-Septal dev, mucus, polyps, erosion, perforation             Throat- Mallampati III , mucosa clear , drainage- none, tonsils- atrophic Neck- flexible , trachea midline, no stridor , thyroid nl, carotid no bruit Chest - symmetrical excursion , unlabored           Heart/CV- RRR , no murmur , no gallop  ,  no rub, nl s1 s2                           - JVD- none , edema- none, stasis changes- none, varices- none           Lung- + clear, wheeze-none, cough+barking(?tracheomalacia) , dullness-none, rub- none           Chest wall-  +Hx R lumpectomy/XRT Abd-  Br/ Gen/ Rectal- Not done, not indicated Extrem- cyanosis- none, clubbing, none, atrophy- none, strength- nl Neuro- grossly intact to observation

## 2023-02-14 ENCOUNTER — Ambulatory Visit: Payer: Medicare Other | Admitting: Internal Medicine

## 2023-02-15 ENCOUNTER — Ambulatory Visit: Payer: Medicare Other

## 2023-02-15 ENCOUNTER — Encounter: Payer: Medicare Other | Admitting: Psychology

## 2023-02-22 ENCOUNTER — Other Ambulatory Visit: Payer: Self-pay | Admitting: *Deleted

## 2023-02-23 ENCOUNTER — Ambulatory Visit: Payer: Medicare PPO | Admitting: Urology

## 2023-02-23 ENCOUNTER — Ambulatory Visit
Admission: RE | Admit: 2023-02-23 | Discharge: 2023-02-23 | Disposition: A | Discharge status: Home / Self Care | Payer: Medicare PPO | Source: Ambulatory Visit | Attending: Urology | Admitting: Urology

## 2023-02-23 ENCOUNTER — Encounter: Payer: Self-pay | Admitting: Urology

## 2023-02-23 ENCOUNTER — Ambulatory Visit
Admission: RE | Admit: 2023-02-23 | Discharge: 2023-02-23 | Disposition: A | Discharge status: Home / Self Care | Payer: Medicare PPO | Attending: Urology | Admitting: Urology

## 2023-02-23 VITALS — BP 193/94 | HR 67 | Ht <= 58 in | Wt 113.6 lb

## 2023-02-23 DIAGNOSIS — Z87442 Personal history of urinary calculi: Secondary | ICD-10-CM | POA: Diagnosis not present

## 2023-02-23 DIAGNOSIS — N200 Calculus of kidney: Secondary | ICD-10-CM

## 2023-02-23 MED ORDER — POTASSIUM CITRATE ER 15 MEQ (1620 MG) PO TBCR: 2.0000 | EXTENDED_RELEASE_TABLET | Freq: Every day | ORAL | 11 refills | Status: AC

## 2023-02-23 NOTE — Progress Notes (Signed)
02/23/2023 2:54 PM   Katie Zimmerman 12-22-1945 [id number]  Reason for visit: Follow up nephrolithiasis  HPI: 77 year old female who was found to have a left partial staghorn kidney stone on CT in August 2022.  She has a history of uric acid stone and was previously followed by [facility].  She opted for trial of potassium citrate for dissolution with no improvement after a few months, and she opted for ureteroscopy.  She underwent uncomplicated left ureteroscopy, laser lithotripsy for her large left partial staghorn stone in January 2023.  There was a small 1.5 cm portion of stone in the lower pole that could not be accessed or visualized with the ureteroscope.  This has been stable on KUB since that time, and I personally viewed and interpreted the KUB today that shows stable 1.5 cm left lower pole fragment.  She denies any problems since our last visit, specifically any gross hematuria, flank pain, or stone events.   We discussed general stone prevention strategies including adequate hydration with goal of producing 2.5 L of urine daily, increasing citric acid intake, increasing calcium intake during high oxalate meals, minimizing animal protein, and decreasing salt intake. Information about dietary recommendations given today.  She is interested in potentially decreasing the dose of potassium citrate, and we change this to 1 time daily.  Will check UA at follow-up to confirm pH appropriate.  She also had questions today about resuming sexual activity.  She has not been sexually active over the last few years.  I recommended using sufficient lubrication, and if she is having any dyspareunia would recommend a trial of topical estrogen cream.  Potassium citrate refilled, changed to once daily Okay to fill topical estrogen cream if dyspareunia in the future RTC 1 year KUB prior, UA for pH check   Sondra Come, MD  [facility] [address], [address] [place], [place] [address] [phone]

## 2023-02-23 NOTE — Patient Instructions (Signed)
Dyspareunia, Female Dyspareunia is pain that is associated with sexual activity. This can affect any part of the genitals or lower abdomen. There are many possible causes of this condition. In some cases, diagnosing the cause of dyspareunia can be difficult. This condition can be mild, moderate, or severe. Depending on the cause, dyspareunia may get better with treatment, but it may return (recur) over time. What are the causes?  The cause of this condition is not always known. However, problems that affect the outer female genital area (vulva), the vagina, the uterus, and other organs may cause dyspareunia. Common causes of this condition include: Vaginal dryness. Giving birth. Infection. Skin changes or conditions. Side effects of medicines. A condition of severe pain and tenderness of the vulva when it is touched (vulvodynia). Endometriosis. This is when tissue that is like the lining of the uterus grows on the outside of the uterus. Psychological conditions. These include depression, anxiety, or traumatic experiences. Allergic reaction. What increases the risk? The following factors may make you more likely to develop this condition: History of physical or sexual trauma. Some medicines. No longer having a monthly period (menopause). Having recently given birth. Taking baths using soaps that have perfumes. These can cause irritation. Douching. What are the signs or symptoms? The main symptom of this condition is pain in any part of your genitals or lower abdomen during or after sex. This may include: Pain and tenderness of the vulva when it is touched. Irritation, burning, or stinging sensations in your vulva. Aching and throbbing pain that may be constant. Pain that gets worse when something is inserted into your vagina. How is this diagnosed? This condition may be diagnosed based on: Your symptoms, including where and when your pain occurs. Your medical history. A physical  exam. A pelvic exam will most likely be done. Tests, including blood tests and tests that check the body for infection. Imaging tests, such as an ultrasound. You may be referred to a health care provider who specializes in women's health (gynecologist). How is this treated? Treatment for this condition depends on the cause of the condition and your symptoms. Treatment may include: Lubricants, ointments, and creams. Physical therapy. Massage therapy. Hormonal therapy. Medicines to: Prevent or fight infection. Relieve pain. Help numb the area. Treat depression (antidepressants). Counseling, which may include sex therapy. Surgery. In most cases, you may need to stop sexual activity until your symptoms go away or get better. Follow these instructions at home: Lifestyle Wear cotton underwear. Use water-based lubricants as needed during sex. Avoid oil-based lubricants. Do not use any products that can cause irritation. This may include certain condoms, spermicides, lubricants, soaps, tampons, vaginal sprays, or douches. Always practice safe sex. Talk to your health care provider about how to prevent sexually transmitted infections with this condition. Talk freely with your partner about your condition. General instructions Take over-the-counter and prescription medicines only as told by your health care provider. Urinate before you have sex. Consider joining a support group. It is up to you to get the results of any tests you have done. Ask your health care provider, or the department that is doing the tests, when your results will be ready. Keep all follow-up visits. This is important. Contact a health care provider if: You have vaginal bleeding after having sex. You develop a lump at the opening of your vagina even if the lump is painless. You have symptoms that get worse or do not improve with treatment. You have: Abnormal discharge from your  vagina. Vaginal dryness. Itchiness or  irritation of your vulva or vagina. You develop a new rash. You have a fever. You have pain when you urinate or blood in your urine. Summary Dyspareunia is pain that is associated with sexual activity. This can affect any part of the genitals or lower abdomen. There are many causes of this condition. Treatment depends on the cause and your symptoms. In most cases, you may need to stop sexual activity until your symptoms improve. Take over-the-counter and prescription medicines only as told by your health care provider. Contact a health care provider if your symptoms get worse or do not improve with treatment. Keep all follow-up visits. This is important. This information is not intended to replace advice given to you by your health care provider. Make sure you discuss any questions you have with your health care provider. Document Revised: 09/22/2020 Document Reviewed: 09/22/2020 Elsevier Patient Education  2024 [employer].

## 2023-03-03 ENCOUNTER — Ambulatory Visit: Payer: Medicare Other

## 2023-03-09 ENCOUNTER — Ambulatory Visit: Payer: Medicare PPO | Admitting: Urology

## 2023-03-20 ENCOUNTER — Ambulatory Visit: Payer: Medicare Other

## 2023-04-12 ENCOUNTER — Ambulatory Visit: Payer: Medicare Other

## 2023-04-13 NOTE — Progress Notes (Deleted)
-HPI  female never smoker(prolonged second hand smoke) followed for chronic bronchitis, complicated by right breast cancer/XRT, GERD, Postherpetic Neuralgia,  Office Spirometry 10/16/2017-moderate restriction of exhaled volume without obvious obstruction.  FVC 1.57/68%, FEV1 1.27/73%, ratio 0.81, FEF 25-75% 1.37/88% Echocardiogram EF 65-70, grade 1 diastolic dysfunction PFT 12/18/2017-mild restriction, minimal diffusion defect.  FVC 1.72/71%, FEV1 1.53/85%, ratio 0.89, TLC 69%, DLCO 74% Labs 11/17/2017-IgE 241 H, Eos  0.5, BNP 69 FENO 06/20/2018-- 11 WNL --------------------------------------------------------------   11/16/21- -77 year old female never smoker(prolonged second hand smoke) followed for chronic bronchitis/ COPD, complicated by right Breast Cancer/XRT, GERD , Postherpetic Neuralgia, Obesity, Kidney Stone,  -Trelegy 100, Ventolin hfa, neb Duoneb, Tessalon,  Covid vax-3 Phizer Flu vax-had -----Follow up.  Still has chronic cough but was unable to collect a sputum specimen.  Breathing feels stable.  Trelegy too expensive and we discussed price comparison with [facility] before looking at other options.  Incidental headache today. Discussed her work-up for kidney stone. CXR 07/06/21- IMPRESSION: No acute abnormalities. 11 x 5 mm LEFT renal calculus with LEFT ureteral stent noted. Aortic Atherosclerosis (ICD10-I70.0).  04/14/23- -77 year old female never smoker(prolonged second hand smoke) followed for Chronic Bronchitis/ COPD, complicated by right Breast Cancer/XRT, GERD , Postherpetic Neuralgia, Obesity, Kidney Stone,  -Trelegy 100, Ventolin hfa, neb Duoneb, Tessalon    ROS-see HPI  + = positive Constitutional:   No-   weight loss, night sweats, fevers, chills, fatigue, lassitude. HEENT:   +headaches, difficulty swallowing, tooth/dental problems, sore throat,       No-  sneezing, itching, ear ache, nasal congestion, post nasal drip,  CV:  No-   chest pain, orthopnea, PND, swelling  in lower extremities, anasarca,                                      dizziness, palpitations Resp: +shortness of breath with exertion or at rest.             +productive cough,  + non-productive cough,  No- coughing up of blood.            +change in color of mucus.   +wheezing.   Skin: No-   rash or lesions. GI:  No-   heartburn, indigestion, abdominal pain, nausea, vomiting, diarrhea,                 change in bowel habits, loss of appetite GU:  MS:  No-   joint pain or swelling.   Neuro-    + Falls, + postherpetic neuralgia Psych:  No- change in mood or affect. No depression or anxiety.  No memory loss.  OBJ- Physical Exam General- Alert, Oriented, Affect-appropriate, Distress- none acute, + obese Skin- rash-none, lesions- none, excoriation- none Lymphadenopathy- none Head- atraumatic            Eyes- Gross vision intact, PERRLA, conjunctivae and secretions clear            Ears- Hearing, canals-normal            Nose- Clear, no-Septal dev, mucus, polyps, erosion, perforation             Throat- Mallampati III , mucosa clear , drainage- none, tonsils- atrophic Neck- flexible , trachea midline, no stridor , thyroid nl, carotid no bruit Chest - symmetrical excursion , unlabored           Heart/CV- RRR , no murmur , no gallop  , no rub, nl  s1 s2                           - JVD- none , edema- none, stasis changes- none, varices- none           Lung- + clear, wheeze-none, cough+barking(?tracheomalacia) , dullness-none, rub- none           Chest wall-  +Hx R lumpectomy/XRT Abd-  Br/ Gen/ Rectal- Not done, not indicated Extrem- cyanosis- none, clubbing, none, atrophy- none, strength- nl Neuro- grossly intact to observation

## 2023-04-14 ENCOUNTER — Ambulatory Visit: Payer: Medicare Other | Admitting: Internal Medicine

## 2023-05-17 ENCOUNTER — Ambulatory Visit: Payer: Self-pay | Admitting: Family Medicine

## 2023-05-17 NOTE — Telephone Encounter (Signed)
Copied from CRM [id number]. Topic: Clinical - Red Word Triage >> May 17, 2023  9:20 AM Lennart Pall wrote: Red Word that prompted transfer to Nurse Triage: Patient called for Blood in urine and panties   Chief Complaint: vaginal bleeding Symptoms: blood in urine Frequency: since 05/16/23 Pertinent Negatives: Patient denies adominal cramping or pain with urination Disposition: [] ED /[] Urgent Care (no appt availability in office) / [] Appointment(In office/virtual)/ [x]  [facility]/ [] Home Care/ [] Refused Recommended Disposition /[] [facility]/ []  Follow-up with PCP Additional Notes: The patient reported blood in her urine once yesterday.  This morning she noted blood in her panties and in the commode when she urinated.  No clots were present.  She denied abdominal pain or cramping or pain with urination.  The patient does have a kidney stone but she stated that she is not having any other symptoms that she has had in the past that would indicate that her stone moved.  She was scheduled for a next day appointment.    Reason for Disposition  Postmenopausal vaginal bleeding  Answer Assessment - Initial Assessment Questions 1. AMOUNT: "Describe the bleeding that you are having."    - SPOTTING: spotting, or pinkish / brownish mucous discharge; does not fill panty liner or pad    - MILD:  less than 1 pad / hour; less than patient's usual menstrual bleeding   - MODERATE: 1-2 pads / hour; 1 menstrual cup every 6 hours; small-medium blood clots (e.g., pea, grape, small coin)   - SEVERE: soaking 2 or more pads/hour for 2 or more hours; 1 menstrual cup every 2 hours; bleeding not contained by pads or continuous red blood from vagina; large blood clots (e.g., golf ball, large coin)      About 2 inches long an inch wide  2. ONSET: "When did the bleeding begin?" "Is it continuing now?"     Yesterday - blood in urine once This morning blood in panties and when she urinated once  5. ABDOMEN  PAIN: "Do you have any pain?" "How bad is the pain?"  (e.g., Scale 1-10; mild, moderate, or severe)   - MILD (1-3): doesn't interfere with normal activities, abdomen soft and not tender to touch    - MODERATE (4-7): interferes with normal activities or awakens from sleep, abdomen tender to touch    - SEVERE (8-10): excruciating pain, doubled over, unable to do any normal activities      No abdominal pain 9. BLOOD THINNER MEDICINES: "Do you take any blood thinners?" (e.g., Coumadin / warfarin, Pradaxa / dabigatran, aspirin)     None  12. OTHER SYMPTOMS: "What other symptoms are you having with the bleeding?" (e.g., passed tissue, vaginal discharge, fever, menstrual-type cramps)       None  Answer Assessment - Initial Assessment Questions 1. AMOUNT: "Describe the bleeding that you are having." "How much bleeding is there?"    - SPOTTING: spotting, or pinkish / brownish mucous discharge; does not fill panty liner or pad    - MILD:  less than 1 pad / hour; less than patient's usual menstrual bleeding   - MODERATE: 1-2 pads / hour; 1 menstrual cup every 6 hours; small-medium blood clots (e.g., pea, grape, small coin)   - SEVERE: soaking 2 or more pads/hour for 2 or more hours; 1 menstrual cup every 2 hours; bleeding not contained by pads or continuous red blood from vagina; large blood clots (e.g., golf ball, large coin)      Mild  2. ONSET: "When did the bleeding begin?" "Is it continuing now?"     No 3. MENOPAUSE: "When was your last menstrual period?"      Several years  4. ABDOMEN PAIN: "Do you have any pain?" "How bad is the pain?"  (e.g., Scale 1-10; mild, moderate, or severe)   - MILD (1-3): doesn't interfere with normal activities, abdomen soft and not tender to touch    - MODERATE (4-7): interferes with normal activities or awakens from sleep, abdomen tender to touch    - SEVERE (8-10): excruciating pain, doubled over, unable to do any normal activities      None 5. BLOOD THINNERS: "Do  you take any blood thinners?" (e.g., Coumadin/warfarin, Pradaxa/dabigatran, aspirin)     None 7. CAUSE: "What do you think is causing the bleeding?" (e.g., recent gyn surgery, recent gyn procedure; known bleeding disorder, uterine cancer)       Kidney stone moved but not pain so unsure  8. HEMODYNAMIC STATUS: "Are you weak or feeling lightheaded?" If Yes, ask: "Can you stand and walk normally?"       No 9. OTHER SYMPTOMS: "What other symptoms are you having with the bleeding?" (e.g., back pain, burning with urination, fever)     None  Protocols used: Vaginal Bleeding - Abnormal-A-AH, Vaginal Bleeding - Postmenopausal-A-AH

## 2023-05-17 NOTE — Telephone Encounter (Signed)
Noted. Agree with appt next day for possible UTI

## 2023-05-18 ENCOUNTER — Ambulatory Visit: Payer: Medicare PPO | Admitting: Family Medicine

## 2023-05-18 ENCOUNTER — Encounter: Payer: Self-pay | Admitting: Family Medicine

## 2023-05-18 VITALS — BP 185/101 | HR 73 | Temp 98.8°F | Ht <= 58 in | Wt 115.2 lb

## 2023-05-18 DIAGNOSIS — R310 Gross hematuria: Secondary | ICD-10-CM

## 2023-05-18 DIAGNOSIS — Z23 Encounter for immunization: Secondary | ICD-10-CM

## 2023-05-18 DIAGNOSIS — N950 Postmenopausal bleeding: Secondary | ICD-10-CM

## 2023-05-18 DIAGNOSIS — Z8379 Family history of other diseases of the digestive system: Secondary | ICD-10-CM

## 2023-05-18 LAB — POC URINALSYSI DIPSTICK (AUTOMATED)
Bilirubin, UA: NEGATIVE
Glucose, UA: NEGATIVE
Ketones, UA: NEGATIVE
Nitrite, UA: NEGATIVE
Protein, UA: POSITIVE — AB
Spec Grav, UA: 1.015 (ref 1.010–1.025)
Urobilinogen, UA: 0.2 U/dL
pH, UA: 6.5 (ref 5.0–8.0)

## 2023-05-18 NOTE — Progress Notes (Signed)
Patient ID: SKYLUR ZOPPI, female    DOB: 28-Jul-1945, 77 y.o.   MRN: [id number]  This visit was conducted in person.  BP (!) 185/101 (BP Location: Left Arm, Patient Position: Sitting, Cuff Size: Normal)   Pulse 73   Temp 98.8 F (37.1 C) (Temporal)   Ht 4' 9.5" (1.461 m)   Wt 115 lb 4 oz (52.3 kg)   SpO2 95%   BMI 24.51 kg/m    CC:  Chief Complaint  Patient presents with   Abnormal Bleeding    Vaginal vs Bladder    Subjective:   HPI: TEA KUNG is a 77 y.o. female presenting on 05/18/2023 for Abnormal Bleeding (Vaginal vs Bladder)  Reviewed office visit with Dr. Irish Elders for nephrolithiasis February 23, 2023 She underwent uncomplicated left ureteroscopy, laser lithotripsy for her large left partial staghorn stone in January 2023.  There was a small 1.5 cm portion of stone in the lower pole that could not be accessed or visualized with the ureteroscope.  This has been stable on KUB since that time.  Today she reports sudden onset blood in toilet bowl after urinating. No burning, no frequency, no urgency.   Yesterday noted are of blood on underwear, also noted blood in toilet after urinating.   None  blood since.   No abdominal pain, no fever.  No blood in stool.  BP elevated in office today... she states BP well controlled at home.     Relevant past medical, surgical, family and social history reviewed and updated as indicated. Interim medical history since our last visit reviewed. Allergies and medications reviewed and updated. Outpatient Medications Prior to Visit  Medication Sig Dispense Refill   acetaminophen (TYLENOL) 500 MG tablet Take 1,000 mg by mouth every 4 (four) hours as needed for mild pain, moderate pain or headache.      albuterol (VENTOLIN HFA) 108 (90 Base) MCG/ACT inhaler INHALE 2 PUFFS BY MOUTH EVERY 6 HOURS AS NEEDED FOR WHEEZING FOR SHORTNESS OF BREATH 9 g 5   Cholecalciferol (VITAMIN D) 50 MCG (2000 UT) tablet Take 2,000 Units by mouth daily.      DULoxetine (CYMBALTA) 30 MG capsule Take 1 tablet every night 90 capsule 3   gabapentin (NEURONTIN) 600 MG tablet Take 1 tablet three times a day 270 tablet 3   ipratropium-albuterol (DUONEB) 0.5-2.5 (3) MG/3ML SOLN Take 3 mLs by nebulization every 6 (six) hours as needed. 150 mL 12   Multiple Vitamins-Minerals (GNP HAIR/SKIN/NAILS PO) Take 3 tablets by mouth daily.     nortriptyline (PAMELOR) 50 MG capsule Take 3 capsules every night 270 capsule 3   ondansetron (ZOFRAN) 4 MG tablet Take 4 mg by mouth every 8 (eight) hours as needed.     Potassium Citrate 15 MEQ (1620 MG) TBCR Take 2 tablets by mouth daily. 120 tablet 11   SF 5000 PLUS 1.1 % CREA dental cream Place 1 application  onto teeth at bedtime.     traMADol (ULTRAM) 50 MG tablet TAKE 1/2 TO 1 (ONE-HALF TO ONE) TABLET BY MOUTH ONCE DAILY AS NEEDED FOR SEVERE PAIN 30 tablet 5   TRELEGY ELLIPTA 100-62.5-25 MCG/ACT AEPB Inhale 1 puff into the lungs daily.     vitamin B-12 (CYANOCOBALAMIN) 1000 MCG tablet Take 1,000 mcg by mouth daily.     diclofenac Sodium (VOLTAREN) 1 % GEL Apply 1 application topically 2 (two) times daily as needed (pain).     moxifloxacin (VIGAMOX) 0.5 % ophthalmic solution Apply to eye.  azithromycin (ZITHROMAX) 250 MG tablet Take 1 tablet (250 mg total) by mouth as directed. 6 tablet 0   benzonatate (TESSALON) 200 MG capsule Take 1 capsule (200 mg total) by mouth 3 (three) times daily as needed for cough. 20 capsule 0   No facility-administered medications prior to visit.     Per HPI unless specifically indicated in ROS section below Review of Systems  Constitutional:  Negative for fatigue and fever.  HENT:  Negative for congestion.   Eyes:  Negative for pain.  Respiratory:  Negative for cough and shortness of breath.   Cardiovascular:  Negative for chest pain, palpitations and leg swelling.  Gastrointestinal:  Negative for abdominal pain.  Genitourinary:  Negative for dysuria and vaginal bleeding.   Musculoskeletal:  Negative for back pain.  Neurological:  Negative for syncope, light-headedness and headaches.  Psychiatric/Behavioral:  Negative for dysphoric mood.    Objective:  BP (!) 185/101 (BP Location: Left Arm, Patient Position: Sitting, Cuff Size: Normal)   Pulse 73   Temp 98.8 F (37.1 C) (Temporal)   Ht 4' 9.5" (1.461 m)   Wt 115 lb 4 oz (52.3 kg)   SpO2 95%   BMI 24.51 kg/m   Wt Readings from Last 3 Encounters:  05/18/23 115 lb 4 oz (52.3 kg)  02/23/23 113 lb 9.6 oz (51.5 kg)  06/23/22 118 lb 8 oz (53.8 kg)      Physical Exam Exam conducted with a chaperone present.  Constitutional:      General: She is not in acute distress.    Appearance: Normal appearance. She is well-developed. She is not ill-appearing or toxic-appearing.  HENT:     Head: Normocephalic.     Right Ear: Hearing, tympanic membrane, ear canal and external ear normal. Tympanic membrane is not erythematous, retracted or bulging.     Left Ear: Hearing, tympanic membrane, ear canal and external ear normal. Tympanic membrane is not erythematous, retracted or bulging.     Nose: No mucosal edema or rhinorrhea.     Right Sinus: No maxillary sinus tenderness or frontal sinus tenderness.     Left Sinus: No maxillary sinus tenderness or frontal sinus tenderness.     Mouth/Throat:     Pharynx: Uvula midline.  Eyes:     General: Lids are normal. Lids are everted, no foreign bodies appreciated.     Conjunctiva/sclera: Conjunctivae normal.     Pupils: Pupils are equal, round, and reactive to light.  Neck:     Thyroid: No thyroid mass or thyromegaly.     Vascular: No carotid bruit.     Trachea: Trachea normal.  Cardiovascular:     Rate and Rhythm: Normal rate and regular rhythm.     Pulses: Normal pulses.     Heart sounds: Normal heart sounds, S1 normal and S2 normal. No murmur heard.    No friction rub. No gallop.  Pulmonary:     Effort: Pulmonary effort is normal. No tachypnea or respiratory  distress.     Breath sounds: Normal breath sounds. No decreased breath sounds, wheezing, rhonchi or rales.  Abdominal:     General: Bowel sounds are normal.     Palpations: Abdomen is soft.     Tenderness: There is no abdominal tenderness.  Genitourinary:    Exam position: Lithotomy position.     Pubic Area: No rash.      Labia:        Right: No rash or tenderness.        Left:  No rash or tenderness.      Urethra: No prolapse, urethral pain, urethral swelling or urethral lesion.     Vagina: Normal.     Cervix: Friability present.     Uterus: Normal. Not deviated, not enlarged, not fixed and not tender.      Adnexa: Right adnexa normal and left adnexa normal.     Rectum: Normal.  Musculoskeletal:     Cervical back: Normal range of motion and neck supple.  Skin:    General: Skin is warm and dry.     Findings: No rash.  Neurological:     Mental Status: She is alert.  Psychiatric:        Mood and Affect: Mood is not anxious or depressed.        Speech: Speech normal.        Behavior: Behavior normal. Behavior is cooperative.        Thought Content: Thought content normal.        Judgment: Judgment normal.       Results for orders placed or performed in visit on 05/18/23  POCT Urinalysis Dipstick (Automated)   Collection Time: 05/18/23  2:24 PM  Result Value Ref Range   Color, UA Yellow    Clarity, UA Clear    Glucose, UA Negative Negative   Bilirubin, UA Negative    Ketones, UA Negative    Spec Grav, UA 1.015 1.010 - 1.025   Blood, UA Large (3+)    pH, UA 6.5 5.0 - 8.0   Protein, UA Positive (A) Negative   Urobilinogen, UA 0.2 0.2 or 1.0 E.U./dL   Nitrite, UA Negative    Leukocytes, UA Trace (A) Negative    Assessment and Plan  Gross hematuria Assessment & Plan: Acute, seems to be primarily in urine.  Urinalysis does not show significant white blood cells or nitrite suggesting against urinary tract infection.  Will verify this with urine culture. She does have a  remnant of a staghorn calculi that could potentially be causing some blood in her urine. Vaginal exam today revealed no blood coming from the cervical os, there was a small friable area of the cervix with slight increased appearance of vascularity but no acute bleeding.  No blood in the vaginal vault.  If urine culture is negative I would suggest recommending follow-up with urology if blood recurs.  Also we can consider ultrasound uterus if blood recurs to look into uterine source further.  Orders: -     POCT Urinalysis Dipstick (Automated) -     Urine Culture  Post-menopausal bleeding  Need for influenza vaccination -     Flu Vaccine Trivalent High Dose (Fluad)  Family history of liver disease    No follow-ups on file.   [name], MD

## 2023-05-18 NOTE — Assessment & Plan Note (Signed)
Acute, seems to be primarily in urine.  Urinalysis does not show significant white blood cells or nitrite suggesting against urinary tract infection.  Will verify this with urine culture. She does have a remnant of a staghorn calculi that could potentially be causing some blood in her urine. Vaginal exam today revealed no blood coming from the cervical os, there was a small friable area of the cervix with slight increased appearance of vascularity but no acute bleeding.  No blood in the vaginal vault.  If urine culture is negative I would suggest recommending follow-up with urology if blood recurs.  Also we can consider ultrasound uterus if blood recurs to look into uterine source further.

## 2023-05-20 LAB — URINE CULTURE
MICRO NUMBER:: 15872003
SPECIMEN QUALITY:: ADEQUATE

## 2023-05-22 ENCOUNTER — Other Ambulatory Visit: Payer: Self-pay | Admitting: Family Medicine

## 2023-05-22 MED ORDER — NITROFURANTOIN MONOHYD MACRO 100 MG PO CAPS: 100.0000 mg | ORAL_CAPSULE | Freq: Two times a day (BID) | ORAL | 0 refills | Status: DC

## 2023-05-25 ENCOUNTER — Telehealth: Payer: Self-pay | Admitting: Family Medicine

## 2023-05-25 DIAGNOSIS — H1820 Unspecified corneal edema: Secondary | ICD-10-CM | POA: Diagnosis not present

## 2023-05-25 DIAGNOSIS — H2000 Unspecified acute and subacute iridocyclitis: Secondary | ICD-10-CM | POA: Diagnosis not present

## 2023-05-25 DIAGNOSIS — H10411 Chronic giant papillary conjunctivitis, right eye: Secondary | ICD-10-CM | POA: Diagnosis not present

## 2023-05-25 NOTE — Telephone Encounter (Signed)
Pt said she does not want a refill of abx. Pt said the nitrofurantoin  was sent to [facility] [address] and pt wants rx sent [facility]. Pt will call [facility] to have rx transferred nothing else needed.

## 2023-05-25 NOTE — Telephone Encounter (Signed)
Prescription Request  05/25/2023  LOV: 05/18/2023  What is the name of the medication or equipment? Nitrofurantoin 100mg    Have you contacted your pharmacy to request a refill? No   Which pharmacy would you like this sent to? [id number], [place] - [address] [place] [address] Phone: [phone] Fax: [phone]   Patient notified that their request is being sent to the clinical staff for review and that they should receive a response within 2 business days.   Please advise at Mobile [phone] (mobile)

## 2023-05-25 NOTE — Telephone Encounter (Signed)
Please triage.  Normally don't refill antibiotics.

## 2023-05-26 NOTE — Telephone Encounter (Signed)
Copied from CRM [id number]. Topic: Clinical - Medication Refill >> May 25, 2023  4:07 PM Thomes Dinning wrote: Most Recent Primary Care Visit:  Provider: [name] E  Department: [facility]  Visit Type: ACUTE  Date: 05/18/2023  Medication: nitrofurantoin, macrocrystal-monohydrate, (MACROBID) 100 MG capsule  Has the patient contacted their pharmacy? Yes (Agent: If no, request that the patient contact the pharmacy for the refill. If patient does not wish to contact the pharmacy document the reason why and proceed with request.) (Agent: If yes, when and what did the pharmacy advise?)  Is this the correct pharmacy for this prescription? No If no, delete pharmacy and type the correct one.  This is the patient's preferred pharmacy:   [id number] [place] - [address] [place] [address] Phone: [phone] Fax: [phone]   Has the prescription been filled recently? Yes, it was sent to the wrong pharmacy  Is the patient out of the medication? No  Has the patient been seen for an appointment in the last year OR does the patient have an upcoming appointment? Yes  Can we respond through MyChart? Yes  Agent: Please be advised that Rx refills may take up to 3 business days. We ask that you follow-up with your pharmacy.

## 2023-06-09 ENCOUNTER — Ambulatory Visit: Payer: Medicare PPO

## 2023-06-09 ENCOUNTER — Telehealth: Payer: Self-pay | Admitting: *Deleted

## 2023-06-09 DIAGNOSIS — E7800 Pure hypercholesterolemia, unspecified: Secondary | ICD-10-CM

## 2023-06-09 DIAGNOSIS — R7303 Prediabetes: Secondary | ICD-10-CM

## 2023-06-09 NOTE — Telephone Encounter (Signed)
-----   Message from Alvina Chou sent at 06/09/2023  1:58 PM EST ----- Regarding: Lab orders for Wed, 1.22.25 Patient is scheduled for CPX labs, please order future labs, Thanks , Camelia Eng

## 2023-06-22 ENCOUNTER — Other Ambulatory Visit (INDEPENDENT_AMBULATORY_CARE_PROVIDER_SITE_OTHER): Payer: Medicare PPO

## 2023-06-22 ENCOUNTER — Other Ambulatory Visit: Payer: Medicare PPO

## 2023-06-22 ENCOUNTER — Encounter: Payer: Self-pay | Admitting: Family Medicine

## 2023-06-22 DIAGNOSIS — R7303 Prediabetes: Secondary | ICD-10-CM | POA: Diagnosis not present

## 2023-06-22 DIAGNOSIS — E7800 Pure hypercholesterolemia, unspecified: Secondary | ICD-10-CM

## 2023-06-22 LAB — COMPREHENSIVE METABOLIC PANEL
ALT: 9 U/L (ref 0–35)
AST: 15 U/L (ref 0–37)
Albumin: 4.5 g/dL (ref 3.5–5.2)
Alkaline Phosphatase: 71 U/L (ref 39–117)
BUN: 11 mg/dL (ref 6–23)
CO2: 31 meq/L (ref 19–32)
Calcium: 9.8 mg/dL (ref 8.4–10.5)
Chloride: 104 meq/L (ref 96–112)
Creatinine, Ser: 1 mg/dL (ref 0.40–1.20)
GFR: 54.36 mL/min — ABNORMAL LOW (ref >60.00)
Glucose, Bld: 106 mg/dL — ABNORMAL HIGH (ref 70–99)
Potassium: 3.8 meq/L (ref 3.5–5.1)
Sodium: 141 meq/L (ref 135–145)
Total Bilirubin: 0.5 mg/dL (ref 0.2–1.2)
Total Protein: 7.5 g/dL (ref 6.0–8.3)

## 2023-06-22 LAB — HEMOGLOBIN A1C: Hgb A1c MFr Bld: 5.7 % (ref 4.6–6.5)

## 2023-06-22 LAB — LIPID PANEL
Cholesterol: 224 mg/dL — ABNORMAL HIGH (ref 0–200)
HDL: 73.5 mg/dL (ref >39.00)
LDL Cholesterol: 130 mg/dL — ABNORMAL HIGH (ref 0–99)
NonHDL: 150.88
Total CHOL/HDL Ratio: 3
Triglycerides: 104 mg/dL (ref 0.0–149.0)
VLDL: 20.8 mg/dL (ref 0.0–40.0)

## 2023-06-22 NOTE — Progress Notes (Signed)
No critical labs need to be addressed urgently. We will discuss labs in detail at upcoming office visit.   

## 2023-06-28 ENCOUNTER — Ambulatory Visit
Admission: RE | Admit: 2023-06-28 | Discharge: 2023-06-28 | Disposition: A | Discharge status: Home / Self Care | Payer: Medicare PPO | Source: Ambulatory Visit | Attending: Family Medicine | Admitting: Family Medicine

## 2023-06-28 DIAGNOSIS — Z1231 Encounter for screening mammogram for malignant neoplasm of breast: Secondary | ICD-10-CM

## 2023-06-29 ENCOUNTER — Encounter: Payer: Medicare PPO | Admitting: Family Medicine

## 2023-06-30 NOTE — Progress Notes (Unsigned)
-HPI  female never smoker(prolonged second hand smoke) followed for chronic bronchitis, complicated by right breast cancer/XRT, GERD, Postherpetic Neuralgia,  Office Spirometry 10/16/2017-moderate restriction of exhaled volume without obvious obstruction.  FVC 1.57/68%, FEV1 1.27/73%, ratio 0.81, FEF 25-75% 1.37/88% Echocardiogram EF 65-70, grade 1 diastolic dysfunction PFT 12/18/2017-mild restriction, minimal diffusion defect.  FVC 1.72/71%, FEV1 1.53/85%, ratio 0.89, TLC 69%, DLCO 74% Labs 11/17/2017-IgE 241 H, Eos  0.5, BNP 69 FENO 06/20/2018-- 11 WNL --------------------------------------------------------------    11/16/21- -78 year old female never smoker(prolonged second hand smoke) followed for chronic bronchitis/ COPD, complicated by right Breast Cancer/XRT, GERD , Postherpetic Neuralgia, Obesity, Kidney Stone,  -Trelegy 100, Ventolin hfa, neb Duoneb, Tessalon,  Covid vax-3 Phizer Flu vax-had -----Follow up.  Still has chronic cough but was unable to collect a sputum specimen.  Breathing feels stable.  Trelegy too expensive and we discussed price comparison with Breztri before looking at other options.  Incidental headache today. Discussed her work-up for kidney stone. CXR 07/06/21- IMPRESSION: No acute abnormalities. 11 x 5 mm LEFT renal calculus with LEFT ureteral stent noted. Aortic Atherosclerosis (ICD10-I70.0).  07/03/23- 78 year old female never smoker(prolonged second hand smoke) followed for chronic bronchitis/ COPD, complicated by right Breast Cancer/XRT, GERD , Postherpetic Neuralgia, Obesity, Kidney Stone,  -Trelegy 100, Ventolin hfa, neb Duoneb, Tessalon,  Discussed the use of AI scribe software for clinical note transcription with the patient, who gave verbal consent to proceed.  History of Present Illness   The patient, with a history of chronic bronchitis, presents for a routine follow-up. She reports no significant troubles with her breathing this winter and has not had  any flu or cold symptoms. She attributes this to not being around many people as she does not get out much. She has inhalers, including Trelegy and albuterol, and a nebulizer machine, which she uses occasionally when she feels the need. Her breathing does not wake her up at night. She reports only having one severe bronchitis attack last year, which is an improvement from previous years when she would have three or four attacks. She also mentions the recent loss of her brother due to cirrhosis of the liver.     CXR 07/07/21 IMPRESSION: No acute abnormalities. 11 x 5 mm LEFT renal calculus with LEFT ureteral stent noted. Aortic Atherosclerosis (ICD10-I70.0).  ROS-see HPI  + = positive Constitutional:   No-   weight loss, night sweats, fevers, chills, fatigue, lassitude. HEENT:   +headaches, difficulty swallowing, tooth/dental problems, sore throat,       No-  sneezing, itching, ear ache, nasal congestion, post nasal drip,  CV:  No-   chest pain, orthopnea, PND, swelling in lower extremities, anasarca,                                      dizziness, palpitations Resp: +shortness of breath with exertion or at rest.             +productive cough,  + non-productive cough,  No- coughing up of blood.            +change in color of mucus.   +wheezing.   Skin: No-   rash or lesions. GI:  No-   heartburn, indigestion, abdominal pain, nausea, vomiting, diarrhea,                 change in bowel habits, loss of appetite GU:  MS:  No-   joint pain or  swelling.   Neuro-    + Falls, + postherpetic neuralgia Psych:  No- change in mood or affect. No depression or anxiety.  No memory loss.  OBJ- Physical Exam General- Alert, Oriented, Affect-appropriate, Distress- none acute,  Skin- rash-none, lesions- none, excoriation- none Lymphadenopathy- none Head- atraumatic            Eyes- Gross vision intact, PERRLA, conjunctivae and secretions clear            Ears- Hearing, canals-normal            Nose- Clear,  no-Septal dev, mucus, polyps, erosion, perforation             Throat- Mallampati III , mucosa clear , drainage- none, tonsils- atrophic Neck- flexible , trachea midline, no stridor , thyroid nl, carotid no bruit Chest - symmetrical excursion , unlabored           Heart/CV- RRR , no murmur , no gallop  , no rub, nl s1 s2                           - JVD- none , edema- none, stasis changes- none, varices- none           Lung- + clear, wheeze-none, cough-none , dullness-none, rub- none           Chest wall-  +Hx R lumpectomy/XRT Abd-  Br/ Gen/ Rectal- Not done, not indicated Extrem- cyanosis- none, clubbing, none, atrophy- none, strength- nl Neuro- grossly intact to observation  Assessment and Plan    Chronic Obstructive Pulmonary Disease (COPD) Stable with no recent exacerbations. No nocturnal symptoms. Uses Trelegy and albuterol inhaler as needed. Physical exam reveals clear lung sounds. -Continue current inhaler regimen. -Refill Trelegy and albuterol inhaler prescriptions. -Refill nebulizer solution as needed. -Encourage patient to call if symptoms worsen.  General Health Maintenance -Continue to limit exposure to large groups to reduce risk of respiratory infections. -Refill medications for three months at a time at [facility] in [place] as per patient's preference.

## 2023-07-03 ENCOUNTER — Encounter: Payer: Self-pay | Admitting: Internal Medicine

## 2023-07-03 ENCOUNTER — Ambulatory Visit (INDEPENDENT_AMBULATORY_CARE_PROVIDER_SITE_OTHER): Payer: Medicare PPO | Admitting: Internal Medicine

## 2023-07-03 VITALS — BP 130/80 | HR 69 | Ht <= 58 in | Wt 108.0 lb

## 2023-07-03 DIAGNOSIS — J449 Chronic obstructive pulmonary disease, unspecified: Secondary | ICD-10-CM | POA: Diagnosis not present

## 2023-07-03 DIAGNOSIS — J410 Simple chronic bronchitis: Secondary | ICD-10-CM

## 2023-07-03 MED ORDER — TRELEGY ELLIPTA 100-62.5-25 MCG/ACT IN AEPB: 1.0000 | INHALATION_SPRAY | Freq: Every day | RESPIRATORY_TRACT | 4 refills | Status: AC

## 2023-07-03 MED ORDER — ALBUTEROL SULFATE HFA 108 (90 BASE) MCG/ACT IN AERS: INHALATION_SPRAY | RESPIRATORY_TRACT | 4 refills | Status: AC

## 2023-07-03 NOTE — Patient Instructions (Addendum)
Trelegy and albuterol inhalers refilled  Please call if we can help

## 2023-07-06 ENCOUNTER — Ambulatory Visit: Payer: Medicare PPO | Admitting: Family Medicine

## 2023-07-06 ENCOUNTER — Encounter: Payer: Self-pay | Admitting: Family Medicine

## 2023-07-06 VITALS — BP 124/80 | HR 66 | Temp 97.9°F | Ht <= 58 in | Wt 108.8 lb

## 2023-07-06 DIAGNOSIS — R310 Gross hematuria: Secondary | ICD-10-CM | POA: Diagnosis not present

## 2023-07-06 DIAGNOSIS — E7800 Pure hypercholesterolemia, unspecified: Secondary | ICD-10-CM

## 2023-07-06 DIAGNOSIS — R7303 Prediabetes: Secondary | ICD-10-CM

## 2023-07-06 DIAGNOSIS — J410 Simple chronic bronchitis: Secondary | ICD-10-CM

## 2023-07-06 DIAGNOSIS — Z0000 Encounter for general adult medical examination without abnormal findings: Secondary | ICD-10-CM

## 2023-07-06 DIAGNOSIS — M810 Age-related osteoporosis without current pathological fracture: Secondary | ICD-10-CM

## 2023-07-06 LAB — POCT UA - MICROSCOPIC ONLY

## 2023-07-06 LAB — POCT URINE DIPSTICK
Bilirubin, UA: NEGATIVE
Glucose, UA: NEGATIVE mg/dL
Ketones, POC UA: NEGATIVE mg/dL
Nitrite, UA: NEGATIVE
POC PROTEIN,UA: 30 — AB
Spec Grav, UA: 1.02 (ref 1.010–1.025)
Urobilinogen, UA: 0.2 U/dL
pH, UA: 5.5 (ref 5.0–8.0)

## 2023-07-06 MED ORDER — EZETIMIBE 10 MG PO TABS: 10.0000 mg | ORAL_TABLET | Freq: Every day | ORAL | 3 refills | Status: AC

## 2023-07-06 NOTE — Addendum Note (Signed)
 Addended by: Herby Lolling E on: 07/06/2023 04:19 PM   Modules accepted: Orders

## 2023-07-06 NOTE — Progress Notes (Addendum)
 Patient ID: Katie Zimmerman, female    DOB: 07/01/1945, 78 y.o.   MRN: [id number]  This visit was conducted in person.  BP 124/80 (BP Location: Left Arm, Patient Position: Sitting, Cuff Size: Normal)   Pulse 66   Temp 97.9 F (36.6 C) (Temporal)   Ht 4' 9.5 (1.461 m)   Wt 108 lb 12.8 oz (49.4 kg)   SpO2 96%   BMI 23.14 kg/m    CC:  Chief Complaint  Patient presents with   Annual Exam    Subjective:   HPI: Katie Zimmerman is a 78 y.o. female presenting on 07/06/2023 for Annual Exam  The patient presents for  complete physical and review of chronic health problems. She also has the following acute concerns today:none  The patient saw a LPN or RN for medicare wellness visit. 07/06/2023  Prevention and wellness was reviewed in detail. Note reviewed and important notes copied below.  Flowsheet Row Office Visit from 07/06/2023 in [facility] [place]  PHQ-2 Total Score 0       2024  neurocognitive testing, Dr. Richie.  Final results showed neuro psychological functioning largely within normal limits from the [place] to age-matched peers.  She was able to learn novel verbal and visual information efficiently and retain this knowledge after lengthy device.  Memory performance was intact as well as cognitive functioning not suggestive of Alzheimer's disease.   No clear neurologic reason for delusion.  Given normal neurocognitive functioning and benign findings across recent neuroimaging, the source of Ms. Fehring's significant delusional thinking appears far more likely to be psychiatric rather than neurological.  She has resolved abberant thinking.  Reviewed labs in detail.  Prediabetes  Lab Results  Component Value Date   HGBA1C 5.7 06/22/2023   Elevated Cholesterol: Lab Results  Component Value Date   CHOL 224 (H) 06/22/2023   HDL 73.50 06/22/2023   LDLCALC 130 (H) 06/22/2023   LDLDIRECT 102.0 02/20/2018   TRIG 104.0 06/22/2023   CHOLHDL 3 06/22/2023  Using  medications without problems: Muscle aches:  Diet compliance: heart healthy diet Exercise: minimal, plans membership  Other complaints:  She has been doing [employer]. Body mass index is 23.14 kg/m.  Wt Readings from Last 3 Encounters:  07/06/23 108 lb 12.8 oz (49.4 kg)  07/03/23 108 lb (49 kg)  05/18/23 115 lb 4 oz (52.3 kg)   The 10-year ASCVD risk score (Arnett DK, et al., 2019) is: 19.2%   Values used to calculate the score:     Age: 18 years     Sex: Female     Is Non-Hispanic African American: No     Diabetic: No     Tobacco smoker: No     Systolic Blood Pressure: 124 mmHg     Is BP treated: No     HDL Cholesterol: 73.5 mg/dL     Total Cholesterol: 224 mg/dL  COPD,  followed by pulmonary on Trelegy.      Patient Care Team: Avelina Greig BRAVO, MD as PCP - General Delford Maude BROCKS, MD as PCP - Cardiology (Cardiology) Georjean Darice HERO, MD as Consulting Physician (Neurology) Myra Rosaline FALCON, [facility] (Inactive) as Pharmacist (Pharmacist) Georjean Darice HERO, MD as Consulting Physician (Neurology)   Relevant past medical, surgical, family and social history reviewed and updated as indicated. Interim medical history since our last visit reviewed. Allergies and medications reviewed and updated. Outpatient Medications Prior to Visit  Medication Sig Dispense Refill  acetaminophen  (TYLENOL ) 500 MG tablet Take 1,000 mg by mouth every 4 (four) hours as needed for mild pain, moderate pain or headache.      albuterol  (VENTOLIN  HFA) 108 (90 Base) MCG/ACT inhaler Inhale 2 puffs every 6 hours as needed- rescue inhaler 54 g 4   Cholecalciferol  (VITAMIN D ) 50 MCG (2000 UT) tablet Take 2,000 Units by mouth daily.     DULoxetine  (CYMBALTA ) 30 MG capsule Take 1 tablet every night 90 capsule 3   gabapentin  (NEURONTIN ) 600 MG tablet Take 1 tablet three times a day 270 tablet 3   ipratropium-albuterol  (DUONEB) 0.5-2.5 (3) MG/3ML SOLN Take 3 mLs by nebulization every 6 (six) hours as needed.  150 mL 12   Multiple Vitamins-Minerals (GNP HAIR/SKIN/NAILS PO) Take 3 tablets by mouth daily.     nortriptyline  (PAMELOR ) 50 MG capsule Take 3 capsules every night 270 capsule 3   ondansetron  (ZOFRAN ) 4 MG tablet Take 4 mg by mouth every 8 (eight) hours as needed.     Potassium Citrate  15 MEQ (1620 MG) TBCR Take 2 tablets by mouth daily. 120 tablet 11   SF 5000 PLUS 1.1 % CREA dental cream Place 1 application  onto teeth at bedtime.     traMADol  (ULTRAM ) 50 MG tablet TAKE 1/2 TO 1 (ONE-HALF TO ONE) TABLET BY MOUTH ONCE DAILY AS NEEDED FOR SEVERE PAIN 30 tablet 5   TRELEGY ELLIPTA  100-62.5-25 MCG/ACT AEPB Inhale 1 puff into the lungs daily. Rinse mouth 180 each 4   vitamin B-12 (CYANOCOBALAMIN) 1000 MCG tablet Take 1,000 mcg by mouth daily.     No facility-administered medications prior to visit.     Per HPI unless specifically indicated in ROS section below Review of Systems  Constitutional:  Negative for fatigue and fever.  HENT:  Negative for congestion.   Eyes:  Negative for pain.  Respiratory:  Negative for cough and shortness of breath.   Cardiovascular:  Negative for chest pain, palpitations and leg swelling.  Gastrointestinal:  Negative for abdominal pain.  Genitourinary:  Negative for dysuria and vaginal bleeding.  Musculoskeletal:  Negative for back pain.  Neurological:  Negative for syncope, light-headedness and headaches.  Psychiatric/Behavioral:  Negative for dysphoric mood.     Objective:  BP 124/80 (BP Location: Left Arm, Patient Position: Sitting, Cuff Size: Normal)   Pulse 66   Temp 97.9 F (36.6 C) (Temporal)   Ht 4' 9.5 (1.461 m)   Wt 108 lb 12.8 oz (49.4 kg)   SpO2 96%   BMI 23.14 kg/m   Wt Readings from Last 3 Encounters:  07/06/23 108 lb 12.8 oz (49.4 kg)  07/03/23 108 lb (49 kg)  05/18/23 115 lb 4 oz (52.3 kg)      Physical Exam Vitals and nursing note reviewed.  Constitutional:      General: She is not in acute distress.    Appearance: Normal  appearance. She is well-developed. She is not ill-appearing or toxic-appearing.  HENT:     Head: Normocephalic.     Right Ear: Hearing, tympanic membrane, ear canal and external ear normal.     Left Ear: Hearing, tympanic membrane, ear canal and external ear normal.     Nose: Nose normal.  Eyes:     General: Lids are normal. Lids are everted, no foreign bodies appreciated.     Conjunctiva/sclera: Conjunctivae normal.     Pupils: Pupils are equal, round, and reactive to light.  Neck:     Thyroid : No thyroid  mass or thyromegaly.  Vascular: No carotid bruit.     Trachea: Trachea normal.  Cardiovascular:     Rate and Rhythm: Normal rate and regular rhythm.     Heart sounds: Normal heart sounds, S1 normal and S2 normal. No murmur heard.    No gallop.  Pulmonary:     Effort: Pulmonary effort is normal. No respiratory distress.     Breath sounds: Normal breath sounds. No wheezing, rhonchi or rales.  Abdominal:     General: Bowel sounds are normal. There is no distension or abdominal bruit.     Palpations: Abdomen is soft. There is no fluid wave or mass.     Tenderness: There is no abdominal tenderness. There is no guarding or rebound.     Hernia: No hernia is present.  Musculoskeletal:     Cervical back: Normal range of motion and neck supple.  Lymphadenopathy:     Cervical: No cervical adenopathy.  Skin:    General: Skin is warm and dry.     Findings: No rash.  Neurological:     Mental Status: She is alert.     Cranial Nerves: No cranial nerve deficit.     Sensory: No sensory deficit.  Psychiatric:        Mood and Affect: Mood is not anxious or depressed.        Speech: Speech normal.        Behavior: Behavior normal. Behavior is cooperative.        Judgment: Judgment normal.       Results for orders placed or performed in visit on 07/06/23  POCT URINE DIPSTICK   Collection Time: 07/06/23  3:59 PM  Result Value Ref Range   Color, UA yellow yellow   Clarity, UA clear  clear   Glucose, UA negative negative mg/dL   Bilirubin, UA negative negative   Ketones, POC UA negative negative mg/dL   Spec Grav, UA 8.979 8.989 - 1.025   Blood, UA trace-lysed (A) negative   pH, UA 5.5 5.0 - 8.0   POC PROTEIN,UA =30 (A) negative, trace   Urobilinogen, UA 0.2 0.2 or 1.0 E.U./dL   Nitrite, UA Negative Negative   Leukocytes, UA Moderate (2+) (A) Negative  POCT UA - Microscopic Only   Collection Time: 07/06/23  4:17 PM  Result Value Ref Range   WBC, Ur, HPF, POC few 0 - 5   RBC, Urine, Miroscopic many 0 - 2   Bacteria, U Microscopic     Mucus, UA     Epithelial cells, urine per micros     Crystals, Ur, HPF, POC     Casts, Ur, LPF, POC none    Yeast, UA      Assessment and Plan The patient's preventative maintenance and recommended screening tests for an annual wellness exam were reviewed in full today. Brought up to date unless services declined.  Counselled on the importance of diet, exercise, and its role in overall health and mortality. The patient's FH and SH was reviewed, including their home life, tobacco status, and drug and alcohol status.    Vaccines: Uptodate with pneumovax and prevnar, consider TDAP, shingles.  Refused flu shot.  04/2012 Dr. Avram, colonoscopy 5 adenomas max 6 mm overdue  .SABRA Given > age 48 she will consider this but not really interested as she is asymptomatic. DVE/PAP: not indicated Mammo: Breast cancer s/p  Lumpectomy on right, radiation, followed by Dr. Odean, last mammo 05/2023 DEXA:09/2016; osteopenia, on tamoxifen  (off NOV 2018), fosamax not needed. Repeat  in 2 years.  Worsened back in osteoporosis range 2021.. due repeat   Non smoker, sig second hand some. Hearing loss in right ear. Dr. Neville.  Hep C: neg   Routine general medical examination at a health care facility  Prediabetes Assessment & Plan: Chronic, stable with diet control.    High cholesterol Assessment & Plan: CHronic, poor control of LDL.SABRA no at  goal, 19% 10 year risk.  Statin indicated... she is hesitent.  Gave info on low choleserol diet, herbal treatment. Willstart zetia  10 mg daily and re-eval in 3 months.  The 10-year ASCVD risk score (Arnett DK, et al., 2019) is: 19.2%   Values used to calculate the score:     Age: 64 years     Sex: Female     Is Non-Hispanic African American: No     Diabetic: No     Tobacco smoker: No     Systolic Blood Pressure: 124 mmHg     Is BP treated: No     HDL Cholesterol: 73.5 mg/dL     Total Cholesterol: 224 mg/dL    Simple chronic bronchitis (HCC) Assessment & Plan: Followed by pulmonary. Improved on Trelegy.   Age-related osteoporosis without current pathological fracture -     DG Bone Density; Future  Gross hematuria Assessment & Plan: Resolved gross hematuria following treatment of Citrobacter UTI.  Patient no longer notes blood in her urine but on urinalysis today there was evidence of microscopic hematuria.  She does have kidney stone history and a portion of a staghorn calculi remaining in place.  She recently had urology evaluation in September.  It appears microscopic hematuria is stable for her at baseline. Follow-up with urology as planned  Orders: -     POCT URINE DIPSTICK -     POCT UA - Microscopic Only  Other orders -     Ezetimibe ; Take 1 tablet (10 mg total) by mouth daily.  Dispense: 90 tablet; Refill: 3      Routine general medical examination at a health care facility  Prediabetes Assessment & Plan: Chronic, stable with diet control.    High cholesterol Assessment & Plan: CHronic, poor control of LDL.SABRA no at goal, 19% 10 year risk.  Statin indicated... she is hesitent.  Gave info on low choleserol diet, herbal treatment. Willstart zetia  10 mg daily and re-eval in 3 months.  The 10-year ASCVD risk score (Arnett DK, et al., 2019) is: 19.2%   Values used to calculate the score:     Age: 68 years     Sex: Female     Is Non-Hispanic African American:  No     Diabetic: No     Tobacco smoker: No     Systolic Blood Pressure: 124 mmHg     Is BP treated: No     HDL Cholesterol: 73.5 mg/dL     Total Cholesterol: 224 mg/dL    Simple chronic bronchitis (HCC) Assessment & Plan: Followed by pulmonary. Improved on Trelegy.   Age-related osteoporosis without current pathological fracture -     DG Bone Density; Future  Gross hematuria Assessment & Plan: Resolved gross hematuria following treatment of Citrobacter UTI.  Patient no longer notes blood in her urine but on urinalysis today there was evidence of microscopic hematuria.  She does have kidney stone history and a portion of a staghorn calculi remaining in place.  She recently had urology evaluation in September.  It appears microscopic hematuria is stable for her at baseline. Follow-up  with urology as planned  Orders: -     POCT URINE DIPSTICK -     POCT UA - Microscopic Only  Other orders -     Ezetimibe ; Take 1 tablet (10 mg total) by mouth daily.  Dispense: 90 tablet; Refill: 3      Return in about 3 months (around 10/03/2023) for lab only, cholesterol and CMET.   Greig Ring, MD

## 2023-07-06 NOTE — Assessment & Plan Note (Addendum)
 Followed by pulmonary. Improved on Trelegy.

## 2023-07-06 NOTE — Assessment & Plan Note (Addendum)
 CHronic, poor control of LDL.SABRA no at goal, 19% 10 year risk.  Statin indicated... she is hesitent.  Gave info on low choleserol diet, herbal treatment. Willstart zetia  10 mg daily and re-eval in 3 months.  The 10-year ASCVD risk score (Arnett DK, et al., 2019) is: 19.2%   Values used to calculate the score:     Age: 78 years     Sex: Female     Is Non-Hispanic African American: No     Diabetic: No     Tobacco smoker: No     Systolic Blood Pressure: 124 mmHg     Is BP treated: No     HDL Cholesterol: 73.5 mg/dL     Total Cholesterol: 224 mg/dL

## 2023-07-06 NOTE — Patient Instructions (Signed)
Here is some info I have gathered for you for a trusted medical source. ?Lipid Management With Diet, Uptodate Feb 20.2021, Maple Mirza and Rosenson ? ?Although earlier, smaller trials suggested a benefit of garlic supplementation, a subsequent larger trial failed to demonstrate improvement in lipids with use of any of three different garlic preparations (raw, powdered, or aged). ? ?Bergamot: Improvements in serum lipids have been reported in trials of patients with metabolic syndrome, nonalcoholic fatty liver disease and in hyperlipidemic patients resistant to statin treatment. However, high-quality data on the effects of bergamot are lacking. ? ?Suggestions for you if you would like to try natural supplements to lower cholesterol. ? ?1.Souble fiber : Psyllium ?In a meta-analysis of randomized trials of patients with both normal and elevated cholesterol levels, the addition of 10.2 g/day of psyllium lowered the LDL cholesterol by an average of 12.8 mg/dL  ? ?2. Omega 3s: Mixed results in studies. Given you triglycerides are normal I would not use this. ? ?3.Red yeast rice ( 2.4 grams divided half in AM half in PM): Red yeast rice is a fermented rice product, most often taken as a supplement, which can improve serum cholesterol  via  method similar to prescription statins.  ?Red yeast rice supplements lowered total cholesterol (208 versus 251 mg/dL) and LDL cholesterol (829 versus 175 mg/dL) compared with placebo. ? ?4. Plant sterol.. There are naturally occurring sterols and stanols in nuts, legumes, whole grains, fruits, vegetables, and plant oils. In addition, a number of manufactured products enriched with plant sterols and stanols are commercially available. The margarines containing these compounds (eg, Benecol and Take Control spreads) have been available the longest and are the most studied  ?In a trial of 150 patients with mild hypercholesterolemia,those consuming the fortified margarine experienced a 10 to 14  percent decrease in total cholesterol and LDL cholesterol. ? ?5.Green Tea Catechins: .n a year-long randomized trial of more than 900 healthy postmenopausal women, green tea catechin supplements (1315 mg catechins/day) reduced total cholesterol and LDL cholesterol, increased triglycerides, and had no effect on HDL cholesterol  ? ?

## 2023-07-06 NOTE — Assessment & Plan Note (Signed)
Chronic, stable with diet control.  

## 2023-07-06 NOTE — Assessment & Plan Note (Signed)
 Resolved gross hematuria following treatment of Citrobacter UTI.  Patient no longer notes blood in her urine but on urinalysis today there was evidence of microscopic hematuria.  She does have kidney stone history and a portion of a staghorn calculi remaining in place.  She recently had urology evaluation in September.  It appears microscopic hematuria is stable for her at baseline. Follow-up with urology as planned

## 2023-07-06 NOTE — Addendum Note (Signed)
 Addended by: Dewanda Foots C on: 07/06/2023 03:49 PM   Modules accepted: Orders

## 2023-09-18 ENCOUNTER — Telehealth: Payer: Self-pay | Admitting: *Deleted

## 2023-09-18 DIAGNOSIS — E7800 Pure hypercholesterolemia, unspecified: Secondary | ICD-10-CM

## 2023-09-18 NOTE — Telephone Encounter (Signed)
-----   Message from Gerry Krone sent at 09/14/2023  3:17 PM EDT ----- Regarding: Lab orders for Tue, 5.6.25 Lab orders, thanks

## 2023-10-03 ENCOUNTER — Other Ambulatory Visit: Payer: Medicare PPO

## 2023-10-17 ENCOUNTER — Ambulatory Visit

## 2023-10-24 ENCOUNTER — Other Ambulatory Visit

## 2023-11-14 ENCOUNTER — Other Ambulatory Visit: Payer: Self-pay | Admitting: Neurology

## 2023-11-14 DIAGNOSIS — B0229 Other postherpetic nervous system involvement: Secondary | ICD-10-CM

## 2023-11-15 ENCOUNTER — Telehealth: Payer: Self-pay | Admitting: Neurology

## 2023-11-15 DIAGNOSIS — B0229 Other postherpetic nervous system involvement: Secondary | ICD-10-CM

## 2023-11-15 NOTE — Telephone Encounter (Signed)
 Pt needs to get her gabapentin  duloxetin and nortriptyline  called in to the [facility] on [address]   She has appt with Ty Gales on 12-26-23

## 2023-11-16 MED ORDER — DULOXETINE HCL 30 MG PO CPEP: ORAL_CAPSULE | ORAL | 0 refills | Status: AC

## 2023-11-16 MED ORDER — NORTRIPTYLINE HCL 50 MG PO CAPS: ORAL_CAPSULE | ORAL | 0 refills | Status: AC

## 2023-11-16 MED ORDER — GABAPENTIN 600 MG PO TABS: ORAL_TABLET | ORAL | 0 refills | Status: AC

## 2023-11-16 NOTE — Telephone Encounter (Signed)
 Ok to send refills until f/u but needs to f/u for continued refills, thanks

## 2023-11-16 NOTE — Telephone Encounter (Signed)
 Refills have been sent in for pt

## 2023-11-16 NOTE — Addendum Note (Signed)
 Addended by: Erica Hau on: 11/16/2023 09:21 AM   Modules accepted: Orders

## 2023-12-14 ENCOUNTER — Encounter: Payer: Self-pay | Admitting: Urology

## 2023-12-26 ENCOUNTER — Ambulatory Visit: Admitting: Neurology

## 2024-01-22 ENCOUNTER — Ambulatory Visit

## 2024-01-26 ENCOUNTER — Ambulatory Visit

## 2024-01-26 ENCOUNTER — Telehealth: Payer: Self-pay | Admitting: Family Medicine

## 2024-01-26 NOTE — Telephone Encounter (Signed)
 Copied from CRM #[id number]. Topic: General - Other >> Jan 26, 2024  3:27 PM Robinson H wrote: Reason for CRM: Patient rescheduled her AWV from today to 10/28 at 2:00 pm

## 2024-02-21 ENCOUNTER — Ambulatory Visit: Admitting: Urology

## 2024-02-22 ENCOUNTER — Ambulatory Visit: Payer: Self-pay | Admitting: Urology

## 2024-03-26 ENCOUNTER — Ambulatory Visit

## 2024-03-28 ENCOUNTER — Other Ambulatory Visit: Payer: Self-pay

## 2024-03-28 DIAGNOSIS — N200 Calculus of kidney: Secondary | ICD-10-CM

## 2024-04-01 ENCOUNTER — Ambulatory Visit

## 2024-04-03 ENCOUNTER — Ambulatory Visit
Admission: RE | Admit: 2024-04-03 | Discharge: 2024-04-03 | Disposition: A | Discharge status: Home / Self Care | Source: Ambulatory Visit | Attending: Urology | Admitting: Urology

## 2024-04-03 ENCOUNTER — Ambulatory Visit
Admission: RE | Admit: 2024-04-03 | Discharge: 2024-04-03 | Disposition: A | Discharge status: Home / Self Care | Attending: Urology | Admitting: Urology

## 2024-04-03 ENCOUNTER — Ambulatory Visit (INDEPENDENT_AMBULATORY_CARE_PROVIDER_SITE_OTHER): Admitting: Urology

## 2024-04-03 VITALS — BP 196/107 | HR 71 | Ht <= 58 in | Wt 118.0 lb

## 2024-04-03 DIAGNOSIS — N200 Calculus of kidney: Secondary | ICD-10-CM

## 2024-04-03 DIAGNOSIS — R32 Unspecified urinary incontinence: Secondary | ICD-10-CM | POA: Diagnosis not present

## 2024-04-03 DIAGNOSIS — R3129 Other microscopic hematuria: Secondary | ICD-10-CM

## 2024-04-03 DIAGNOSIS — R3915 Urgency of urination: Secondary | ICD-10-CM

## 2024-04-03 DIAGNOSIS — R350 Frequency of micturition: Secondary | ICD-10-CM

## 2024-04-03 DIAGNOSIS — R3989 Other symptoms and signs involving the genitourinary system: Secondary | ICD-10-CM

## 2024-04-03 DIAGNOSIS — Z87442 Personal history of urinary calculi: Secondary | ICD-10-CM | POA: Diagnosis not present

## 2024-04-03 LAB — MICROSCOPIC EXAMINATION

## 2024-04-03 MED ORDER — CIPROFLOXACIN HCL 500 MG PO TABS: 500.0000 mg | ORAL_TABLET | Freq: Two times a day (BID) | ORAL | 0 refills | Status: AC

## 2024-04-03 NOTE — Progress Notes (Signed)
   04/03/2024 1:13 PM   Katie Zimmerman 1946-04-09 [id number]  Reason for visit: Follow up nephrolithiasis  History: History of left partial staghorn stone, primarily uric acid, no improvement on dissolution therapy and opted for ureteroscopy in January 2023.  Small 1.5 cm portion of left lower pole stone that could not be accessed or visualized with the ureteroscope Overall is done well since that time On potassium citrate  daily for stone prevention  Physical Exam: BP (!) 190/107 (BP Location: Left Arm, Patient Position: Sitting, Cuff Size: Normal)   Pulse 71   Ht 4' 10 (1.473 m)   Wt 118 lb (53.5 kg)   SpO2 98%   BMI 24.66 kg/m   Imaging/labs: Urinalysis today reviewed, urine pH 6.0, greater than 30 WBC, 3-10 RBC, many bacteria, 1+ leukocytes, nitrite positive.  Will send for culture I personally viewed and interpreted the KUB today that shows a stable 1.5 cm left lower pole stone  Today: Mild urinary urgency, frequency, leakage, no dysuria or gross hematuria Denies any flank pain Tolerating potassium citrate   Plan:   Nephrolithiasis: Continue stone prevention with potassium citrate , refilled Abnormal urinalysis/microscopic hematuria: Unclear if this represents UTI, asymptomatic bacteriuria, but in the setting of her known 1.5 cm left lower pole stone that could potentially have a struvite component recommended treatment with recheck urine in 1 month.  Would consider CT if persistent microscopic hematuria Cipro  x 5 days, follow-up urine culture, recheck UA 1 month consider CT +/- cystoscopy if persistent microscopic hematuria   Redell JAYSON Burnet, MD  [facility] [address] [place], [place] [address] [phone]

## 2024-04-04 LAB — URINALYSIS, COMPLETE
Bilirubin, UA: NEGATIVE
Glucose, UA: NEGATIVE
Ketones, UA: NEGATIVE
Nitrite, UA: POSITIVE — AB
Specific Gravity, UA: 1.025 (ref 1.005–1.030)
Urobilinogen, Ur: 0.2 mg/dL (ref 0.2–1.0)
pH, UA: 6 (ref 5.0–7.5)

## 2024-04-04 LAB — MICROSCOPIC EXAMINATION: WBC, UA: >30 /HPF — AB (ref 0–5)

## 2024-04-11 ENCOUNTER — Encounter: Payer: Self-pay | Admitting: Neurology

## 2024-04-12 LAB — CULTURE, URINE COMPREHENSIVE

## 2024-04-15 ENCOUNTER — Ambulatory Visit: Payer: Self-pay | Admitting: Urology

## 2024-05-06 ENCOUNTER — Ambulatory Visit: Admitting: Physician Assistant

## 2024-05-14 ENCOUNTER — Ambulatory Visit: Admitting: Physician Assistant

## 2024-05-29 ENCOUNTER — Ambulatory Visit: Admitting: Physician Assistant

## 2024-06-04 ENCOUNTER — Encounter: Payer: Self-pay | Admitting: Internal Medicine

## 2024-06-12 ENCOUNTER — Ambulatory Visit: Admitting: Physician Assistant

## 2024-06-13 ENCOUNTER — Other Ambulatory Visit: Payer: Self-pay | Admitting: Family Medicine

## 2024-06-13 ENCOUNTER — Encounter: Payer: Self-pay | Admitting: Physician Assistant

## 2024-06-13 ENCOUNTER — Telehealth: Payer: Self-pay

## 2024-06-13 DIAGNOSIS — Z1231 Encounter for screening mammogram for malignant neoplasm of breast: Secondary | ICD-10-CM

## 2024-06-13 NOTE — Telephone Encounter (Signed)
 Copied from CRM [id number]. Topic: Clinical - Request for Lab/Test Order >> Jun 13, 2024  3:29 PM Pinkey ORN wrote: Reason for CRM: Lab Order >> Jun 13, 2024  3:30 PM Pinkey ORN wrote: Patient is requesting the lab orders are submitted, so that she can complete them prior to her upcoming physical. Please contact patient to schedule lab visit.

## 2024-06-13 NOTE — Telephone Encounter (Signed)
 I called and left a voicemail.

## 2024-07-01 ENCOUNTER — Ambulatory Visit

## 2024-07-02 ENCOUNTER — Ambulatory Visit: Payer: Medicare PPO | Admitting: Internal Medicine

## 2024-07-11 ENCOUNTER — Encounter: Admitting: Pulmonary Disease

## 2024-07-16 ENCOUNTER — Ambulatory Visit

## 2024-07-18 ENCOUNTER — Encounter: Admitting: Family Medicine

## 2024-07-18 ENCOUNTER — Ambulatory Visit

## 2024-07-31 ENCOUNTER — Ambulatory Visit: Admitting: Neurology

## 2024-08-07 ENCOUNTER — Encounter: Admitting: Pulmonary Disease
